# Patient Record
Sex: Female | Born: 1950 | Race: Black or African American | Hispanic: No | Marital: Single | Smoking: Former smoker
Health system: Southern US, Community
[De-identification: ages and names within clinical notes are randomized; demographics above are authoritative.]

## PROBLEM LIST (undated history)

## (undated) DIAGNOSIS — R7989 Other specified abnormal findings of blood chemistry: Secondary | ICD-10-CM

## (undated) DIAGNOSIS — J449 Chronic obstructive pulmonary disease, unspecified: Secondary | ICD-10-CM

## (undated) DIAGNOSIS — J439 Emphysema, unspecified: Secondary | ICD-10-CM

## (undated) DIAGNOSIS — Z86010 Personal history of colonic polyps: Secondary | ICD-10-CM

## (undated) DIAGNOSIS — F319 Bipolar disorder, unspecified: Secondary | ICD-10-CM

## (undated) DIAGNOSIS — E119 Type 2 diabetes mellitus without complications: Secondary | ICD-10-CM

## (undated) DIAGNOSIS — E785 Hyperlipidemia, unspecified: Secondary | ICD-10-CM

## (undated) DIAGNOSIS — R945 Abnormal results of liver function studies: Secondary | ICD-10-CM

## (undated) DIAGNOSIS — K219 Gastro-esophageal reflux disease without esophagitis: Secondary | ICD-10-CM

## (undated) DIAGNOSIS — M1990 Unspecified osteoarthritis, unspecified site: Secondary | ICD-10-CM

## (undated) DIAGNOSIS — F0390 Unspecified dementia without behavioral disturbance: Secondary | ICD-10-CM

## (undated) DIAGNOSIS — N390 Urinary tract infection, site not specified: Secondary | ICD-10-CM

## (undated) DIAGNOSIS — I10 Essential (primary) hypertension: Secondary | ICD-10-CM

## (undated) HISTORY — PX: TONSILLECTOMY AND ADENOIDECTOMY: SUR1326

## (undated) HISTORY — DX: Bipolar disorder, unspecified: F31.9

## (undated) HISTORY — DX: Essential (primary) hypertension: I10

## (undated) HISTORY — PX: ESOPHAGOGASTRODUODENOSCOPY: SHX1529

## (undated) HISTORY — PX: TONSILLECTOMY: SUR1361

## (undated) HISTORY — PX: TUBAL LIGATION: SHX77

## (undated) HISTORY — DX: Personal history of colonic polyps: Z86.010

## (undated) HISTORY — DX: Urinary tract infection, site not specified: N39.0

## (undated) HISTORY — PX: COLONOSCOPY: SHX174

## (undated) HISTORY — DX: Abnormal results of liver function studies: R94.5

## (undated) HISTORY — DX: Unspecified dementia, unspecified severity, without behavioral disturbance, psychotic disturbance, mood disturbance, and anxiety: F03.90

## (undated) HISTORY — DX: Other specified abnormal findings of blood chemistry: R79.89

## (undated) HISTORY — DX: Gastro-esophageal reflux disease without esophagitis: K21.9

## (undated) HISTORY — DX: Hyperlipidemia, unspecified: E78.5

## (undated) HISTORY — DX: Unspecified osteoarthritis, unspecified site: M19.90

## (undated) HISTORY — DX: Emphysema, unspecified: J43.9

---

## 1998-03-04 ENCOUNTER — Emergency Department (HOSPITAL_COMMUNITY): Admission: EM | Admit: 1998-03-04 | Discharge: 1998-03-04 | Payer: Self-pay | Admitting: Emergency Medicine

## 1999-07-20 ENCOUNTER — Emergency Department (HOSPITAL_COMMUNITY): Admission: EM | Admit: 1999-07-20 | Discharge: 1999-07-20 | Payer: Self-pay

## 2004-01-02 ENCOUNTER — Emergency Department (HOSPITAL_COMMUNITY): Admission: EM | Admit: 2004-01-02 | Discharge: 2004-01-02 | Payer: Self-pay

## 2004-02-24 ENCOUNTER — Encounter: Admission: RE | Admit: 2004-02-24 | Discharge: 2004-02-24 | Payer: Self-pay | Admitting: Internal Medicine

## 2004-09-04 ENCOUNTER — Emergency Department (HOSPITAL_COMMUNITY): Admission: EM | Admit: 2004-09-04 | Discharge: 2004-09-04 | Payer: Self-pay | Admitting: Emergency Medicine

## 2004-09-07 ENCOUNTER — Emergency Department (HOSPITAL_COMMUNITY): Admission: EM | Admit: 2004-09-07 | Discharge: 2004-09-07 | Payer: Self-pay | Admitting: Emergency Medicine

## 2004-09-09 ENCOUNTER — Emergency Department (HOSPITAL_COMMUNITY): Admission: EM | Admit: 2004-09-09 | Discharge: 2004-09-09 | Payer: Self-pay | Admitting: Emergency Medicine

## 2006-02-15 ENCOUNTER — Emergency Department (HOSPITAL_COMMUNITY): Admission: EM | Admit: 2006-02-15 | Discharge: 2006-02-15 | Payer: Self-pay | Admitting: Family Medicine

## 2006-11-22 ENCOUNTER — Emergency Department (HOSPITAL_COMMUNITY): Admission: EM | Admit: 2006-11-22 | Discharge: 2006-11-22 | Payer: Self-pay | Admitting: Emergency Medicine

## 2007-10-04 ENCOUNTER — Emergency Department (HOSPITAL_COMMUNITY): Admission: EM | Admit: 2007-10-04 | Discharge: 2007-10-04 | Payer: Self-pay | Admitting: Emergency Medicine

## 2007-12-20 ENCOUNTER — Emergency Department (HOSPITAL_COMMUNITY): Admission: EM | Admit: 2007-12-20 | Discharge: 2007-12-20 | Payer: Self-pay | Admitting: Emergency Medicine

## 2008-10-29 ENCOUNTER — Encounter: Admission: RE | Admit: 2008-10-29 | Discharge: 2008-10-29 | Payer: Self-pay | Admitting: Internal Medicine

## 2008-12-14 ENCOUNTER — Emergency Department (HOSPITAL_COMMUNITY): Admission: EM | Admit: 2008-12-14 | Discharge: 2008-12-14 | Payer: Self-pay | Admitting: Emergency Medicine

## 2009-12-08 ENCOUNTER — Encounter: Admission: RE | Admit: 2009-12-08 | Discharge: 2009-12-08 | Payer: Self-pay | Admitting: Internal Medicine

## 2010-10-09 LAB — POCT RAPID STREP A: Streptococcus, Group A Screen (Direct): NEGATIVE

## 2011-03-06 ENCOUNTER — Other Ambulatory Visit: Payer: Self-pay | Admitting: Internal Medicine

## 2011-03-06 DIAGNOSIS — Z1231 Encounter for screening mammogram for malignant neoplasm of breast: Secondary | ICD-10-CM

## 2011-03-27 ENCOUNTER — Ambulatory Visit
Admission: RE | Admit: 2011-03-27 | Discharge: 2011-03-27 | Disposition: A | Discharge status: Home / Self Care | Payer: Medicare Other | Source: Ambulatory Visit | Attending: Internal Medicine | Admitting: Internal Medicine

## 2011-03-27 DIAGNOSIS — Z1231 Encounter for screening mammogram for malignant neoplasm of breast: Secondary | ICD-10-CM

## 2012-05-12 ENCOUNTER — Other Ambulatory Visit: Payer: Self-pay

## 2012-05-12 DIAGNOSIS — Z1231 Encounter for screening mammogram for malignant neoplasm of breast: Secondary | ICD-10-CM

## 2012-06-16 DIAGNOSIS — Z9889 Other specified postprocedural states: Secondary | ICD-10-CM | POA: Insufficient documentation

## 2012-06-16 DIAGNOSIS — J449 Chronic obstructive pulmonary disease, unspecified: Secondary | ICD-10-CM | POA: Insufficient documentation

## 2012-06-18 ENCOUNTER — Ambulatory Visit
Admission: RE | Admit: 2012-06-18 | Discharge: 2012-06-18 | Disposition: A | Discharge status: Home / Self Care | Payer: Medicare Other | Source: Ambulatory Visit

## 2012-06-18 DIAGNOSIS — Z1231 Encounter for screening mammogram for malignant neoplasm of breast: Secondary | ICD-10-CM

## 2012-11-13 ENCOUNTER — Encounter (HOSPITAL_COMMUNITY): Payer: Self-pay | Admitting: Emergency Medicine

## 2012-11-13 ENCOUNTER — Emergency Department (HOSPITAL_COMMUNITY)
Admission: EM | Admit: 2012-11-13 | Discharge: 2012-11-13 | Disposition: A | Discharge status: Home / Self Care | Payer: Medicare Other | Attending: Emergency Medicine | Admitting: Emergency Medicine

## 2012-11-13 ENCOUNTER — Emergency Department (HOSPITAL_COMMUNITY): Payer: Medicare Other

## 2012-11-13 DIAGNOSIS — J449 Chronic obstructive pulmonary disease, unspecified: Secondary | ICD-10-CM | POA: Insufficient documentation

## 2012-11-13 DIAGNOSIS — R002 Palpitations: Secondary | ICD-10-CM | POA: Insufficient documentation

## 2012-11-13 DIAGNOSIS — M79609 Pain in unspecified limb: Secondary | ICD-10-CM | POA: Insufficient documentation

## 2012-11-13 DIAGNOSIS — E119 Type 2 diabetes mellitus without complications: Secondary | ICD-10-CM | POA: Insufficient documentation

## 2012-11-13 DIAGNOSIS — R5381 Other malaise: Secondary | ICD-10-CM | POA: Insufficient documentation

## 2012-11-13 DIAGNOSIS — R072 Precordial pain: Secondary | ICD-10-CM | POA: Insufficient documentation

## 2012-11-13 DIAGNOSIS — R42 Dizziness and giddiness: Secondary | ICD-10-CM | POA: Insufficient documentation

## 2012-11-13 DIAGNOSIS — J4489 Other specified chronic obstructive pulmonary disease: Secondary | ICD-10-CM | POA: Insufficient documentation

## 2012-11-13 HISTORY — DX: Chronic obstructive pulmonary disease, unspecified: J44.9

## 2012-11-13 HISTORY — DX: Type 2 diabetes mellitus without complications: E11.9

## 2012-11-13 LAB — CBC WITH DIFFERENTIAL/PLATELET
Basophils Absolute: 0.1 10*3/uL (ref 0.0–0.1)
Basophils Relative: 1 % (ref 0–1)
Eosinophils Absolute: 0 10*3/uL (ref 0.0–0.7)
Eosinophils Relative: 0 % (ref 0–5)
HCT: 40.5 % (ref 36.0–46.0)
Hemoglobin: 12.6 g/dL (ref 12.0–15.0)
Lymphocytes Relative: 22 % (ref 12–46)
Lymphs Abs: 2.1 10*3/uL (ref 0.7–4.0)
MCH: 25.2 pg — ABNORMAL LOW (ref 26.0–34.0)
MCHC: 31.1 g/dL (ref 30.0–36.0)
MCV: 81 fL (ref 78.0–100.0)
Monocytes Absolute: 0.5 10*3/uL (ref 0.1–1.0)
Monocytes Relative: 5 % (ref 3–12)
Neutro Abs: 6.7 10*3/uL (ref 1.7–7.7)
Neutrophils Relative %: 72 % (ref 43–77)
Platelets: 207 10*3/uL (ref 150–400)
RBC: 5 MIL/uL (ref 3.87–5.11)
RDW: 14.6 % (ref 11.5–15.5)
WBC: 9.4 10*3/uL (ref 4.0–10.5)

## 2012-11-13 LAB — BASIC METABOLIC PANEL
BUN: 19 mg/dL (ref 6–23)
CO2: 28 mEq/L (ref 19–32)
Calcium: 10.8 mg/dL — ABNORMAL HIGH (ref 8.4–10.5)
Chloride: 100 mEq/L (ref 96–112)
Creatinine, Ser: 1.02 mg/dL (ref 0.50–1.10)
GFR calc Af Amer: 67 mL/min — ABNORMAL LOW (ref >90)
GFR calc non Af Amer: 58 mL/min — ABNORMAL LOW (ref >90)
Glucose, Bld: 195 mg/dL — ABNORMAL HIGH (ref 70–99)
Potassium: 3.8 mEq/L (ref 3.5–5.1)
Sodium: 137 mEq/L (ref 135–145)

## 2012-11-13 LAB — URINALYSIS, ROUTINE W REFLEX MICROSCOPIC
Bilirubin Urine: NEGATIVE
Glucose, UA: 100 mg/dL — AB
Hgb urine dipstick: NEGATIVE
Ketones, ur: NEGATIVE mg/dL
Nitrite: NEGATIVE
Protein, ur: NEGATIVE mg/dL
Specific Gravity, Urine: 1.015 (ref 1.005–1.030)
Urobilinogen, UA: 0.2 mg/dL (ref 0.0–1.0)
pH: 7 (ref 5.0–8.0)

## 2012-11-13 LAB — URINE MICROSCOPIC-ADD ON

## 2012-11-13 LAB — POCT I-STAT TROPONIN I: Troponin i, poc: 0 ng/mL (ref 0.00–0.08)

## 2012-11-13 NOTE — ED Notes (Signed)
Pt complains of palpitations since last night along with chest pain and R arm pain. Describes pain as burning. Also complains of lightheadness for past 2-3 weeks.

## 2012-11-13 NOTE — ED Notes (Signed)
Patient transported to X-ray 

## 2012-11-13 NOTE — ED Provider Notes (Signed)
Medical screening examination/treatment/procedure(s) were performed by non-physician practitioner and as supervising physician I was immediately available for consultation/collaboration.  EKG Interpretation     Ventricular Rate:  72 PR Interval:  129 QRS Duration: 77 QT Interval:  375 QTC Calculation: 410 R Axis:   35 Text Interpretation:  Sinus rhythm No significant change was found              Junius Argyle, MD 11/13/12 1529

## 2012-11-13 NOTE — ED Notes (Signed)
Pt states that palpitations have occurred for past 2 weeks. Pt in NAD, pt ambulated to room, refused wheelchair

## 2012-11-13 NOTE — ED Provider Notes (Signed)
CSN: [id number]     Arrival date & time 11/13/12  5784 History   First MD Initiated Contact with Patient 11/13/12 [phone]     Chief Complaint  Patient presents with  . Palpitations  . Chest Pain  . Arm Pain   (Consider location/radiation/quality/duration/timing/severity/associated sxs/prior Treatment) The history is provided by the patient and medical records.   This is a 62 year old female with past medical history significant for diabetes, COPD, presenting to the ED for palpitations for the past 24 hours, but resolved just PTA. Patient states last night when lying in bed she felt a sensation of her heart "fluttering". States fluttering was constant throughout the night, preventing her from sleep. States she had some midsternal chest pain associated with this, none at present. Patient has had episodes of this in the past, she notes they seem to occur mostly at night. Patient denies any excessive caffeine consumption. States sx do not seem related to exertion.  Has been evaluated by cardiology in the past, approximately 10 years ago but states she was cleared from their service due to negative work-up. States she is concerned because her mother was recently dx with Afib and had similar sx.  Patient also complains of some generalized fatigue and occasional lightheadedness for the past several weeks. She denies any headache, dizziness, or syncopal episodes. Denies any hematochezia, urinary sx, fevers, sweats, or recent illness.  Pt states she is not having palpitations or lightheadedness during initial evaluation.  Past Medical History  Diagnosis Date  . Diabetes mellitus without complication   . COPD (chronic obstructive pulmonary disease)    No past surgical history on file. No family history on file. History  Substance Use Topics  . Smoking status: Never Smoker   . Smokeless tobacco: Not on file  . Alcohol Use: No   OB History   No data available     Review of Systems  Constitutional:  Positive for fatigue.  Cardiovascular: Positive for chest pain and palpitations.  Neurological: Positive for light-headedness.  All other systems reviewed and are negative.    Allergies  Review of patient's allergies indicates not on file.  Home Medications  No current outpatient prescriptions on file.  BP 144/72  Pulse 93  Temp(Src) 98.1 F (36.7 C) (Oral)  Resp 20  SpO2 98%  Physical Exam  Nursing note and vitals reviewed. Constitutional: She is oriented to person, place, and time. She appears well-developed and well-nourished. No distress.  HENT:  Head: Normocephalic and atraumatic.  Mouth/Throat: Oropharynx is clear and moist.  Eyes: Conjunctivae and EOM are normal. Pupils are equal, round, and reactive to light.  Neck: Normal range of motion. Neck supple.  Cardiovascular: Normal rate, regular rhythm and normal heart sounds.   Pulmonary/Chest: Effort normal and breath sounds normal. No respiratory distress. She has no wheezes. She has no rales.  Abdominal: Soft. Bowel sounds are normal. There is no tenderness. There is no guarding.  Musculoskeletal: Normal range of motion. She exhibits no edema.  Neurological: She is alert and oriented to person, place, and time. She has normal strength. She displays no tremor. No cranial nerve deficit or sensory deficit. She displays no seizure activity. Gait normal.  No focal neuro deficits appreciated  Skin: Skin is warm and dry. She is not diaphoretic.  Psychiatric: She has a normal mood and affect.    ED Course  Procedures (including critical care time) Labs Review Labs Reviewed  CBC WITH DIFFERENTIAL - Abnormal; Notable for the following:  MCH 25.2 (*)    All other components within normal limits  BASIC METABOLIC PANEL - Abnormal; Notable for the following:    Glucose, Bld 195 (*)    Calcium 10.8 (*)    GFR calc non Af Amer 58 (*)    GFR calc Af Amer 67 (*)    All other components within normal limits  URINALYSIS,  ROUTINE W REFLEX MICROSCOPIC - Abnormal; Notable for the following:    Glucose, UA 100 (*)    Leukocytes, UA SMALL (*)    All other components within normal limits  URINE MICROSCOPIC-ADD ON - Abnormal; Notable for the following:    Bacteria, UA FEW (*)    All other components within normal limits  URINE CULTURE  POCT I-STAT TROPONIN I   Imaging Review Dg Chest 2 View  11/13/2012   CLINICAL DATA:  Chest fluttering.  Diabetes. Hypertension.  EXAM: CHEST  2 VIEW  COMPARISON:  10/06/2007  FINDINGS: Artifact overlies the chest. Heart size is normal. There is a hiatal hernia. There is calcification of the thoracic aorta. The pulmonary vascularity is normal. The lungs are clear. No effusions. No bony abnormalities.  IMPRESSION: No active cardiopulmonary disease. Atherosclerosis of the aorta . Hiatal hernia.   Electronically Signed   By: Paulina Fusi M.D.   On: 11/13/2012 10:22    EKG Interpretation     Ventricular Rate:  72 PR Interval:  129 QRS Duration: 77 QT Interval:  375 QTC Calculation: 410 R Axis:   35 Text Interpretation:  Sinus rhythm No significant change was found            MDM   1. Palpitations    EKG NSR, no acute ischemic changes.  Labs and CXR pending at this time.  Pt without palpitations during initial evaluation-- will continue to monitor on telemetry.    11:59 AM Labs as above.  Troponin negative.  CXR with hiatal hernia but no active cardiopulmonary disease.  Pt has remained asx while in the ED and no abnormalities have been identified while on cardiac monitor.  At this time, i doubt ACS, PE, dissection, or other vascular collapse.  She is low risk for MACE per heart score.  Will refer to cardiology for likely Holter Monitor.  I have discussed this plan with pt, she acknowledged understanding and agreed to FU.  Strict return precautions advised should sx return or worsen.  Discussed with Dr. Romeo Apple who agrees with assessment and plan of care.  Garlon Hatchet, PA-C 11/13/12 1306

## 2012-11-14 LAB — URINE CULTURE: Colony Count: 45000

## 2013-01-20 ENCOUNTER — Ambulatory Visit: Payer: Medicare Other | Admitting: Cardiology

## 2013-03-28 ENCOUNTER — Emergency Department (HOSPITAL_COMMUNITY)
Admission: EM | Admit: 2013-03-28 | Discharge: 2013-03-28 | Disposition: A | Discharge status: Home / Self Care | Payer: Medicare Other | Attending: Emergency Medicine | Admitting: Emergency Medicine

## 2013-03-28 ENCOUNTER — Encounter (HOSPITAL_COMMUNITY): Payer: Self-pay | Admitting: Emergency Medicine

## 2013-03-28 ENCOUNTER — Emergency Department (HOSPITAL_COMMUNITY): Payer: Medicare Other

## 2013-03-28 DIAGNOSIS — R002 Palpitations: Secondary | ICD-10-CM | POA: Insufficient documentation

## 2013-03-28 DIAGNOSIS — Z794 Long term (current) use of insulin: Secondary | ICD-10-CM | POA: Insufficient documentation

## 2013-03-28 DIAGNOSIS — E119 Type 2 diabetes mellitus without complications: Secondary | ICD-10-CM | POA: Insufficient documentation

## 2013-03-28 DIAGNOSIS — J449 Chronic obstructive pulmonary disease, unspecified: Secondary | ICD-10-CM | POA: Insufficient documentation

## 2013-03-28 DIAGNOSIS — J4489 Other specified chronic obstructive pulmonary disease: Secondary | ICD-10-CM | POA: Insufficient documentation

## 2013-03-28 DIAGNOSIS — Z79899 Other long term (current) drug therapy: Secondary | ICD-10-CM | POA: Insufficient documentation

## 2013-03-28 LAB — BASIC METABOLIC PANEL
BUN: 25 mg/dL — AB (ref 6–23)
CHLORIDE: 101 meq/L (ref 96–112)
CO2: 26 mEq/L (ref 19–32)
Calcium: 9.9 mg/dL (ref 8.4–10.5)
Creatinine, Ser: 1.1 mg/dL (ref 0.50–1.10)
GFR calc Af Amer: 61 mL/min — ABNORMAL LOW (ref >90)
GFR calc non Af Amer: 53 mL/min — ABNORMAL LOW (ref >90)
GLUCOSE: 222 mg/dL — AB (ref 70–99)
POTASSIUM: 4.4 meq/L (ref 3.7–5.3)
Sodium: 138 mEq/L (ref 137–147)

## 2013-03-28 LAB — CBC
HEMATOCRIT: 39.3 % (ref 36.0–46.0)
Hemoglobin: 12.6 g/dL (ref 12.0–15.0)
MCH: 25.8 pg — ABNORMAL LOW (ref 26.0–34.0)
MCHC: 32.1 g/dL (ref 30.0–36.0)
MCV: 80.4 fL (ref 78.0–100.0)
Platelets: 232 10*3/uL (ref 150–400)
RBC: 4.89 MIL/uL (ref 3.87–5.11)
RDW: 14.6 % (ref 11.5–15.5)
WBC: 12.3 10*3/uL — AB (ref 4.0–10.5)

## 2013-03-28 LAB — I-STAT TROPONIN, ED: Troponin i, poc: 0 ng/mL (ref 0.00–0.08)

## 2013-03-28 NOTE — ED Notes (Signed)
Pt to triage via [facility].  Pt reports intermittent palpitations and burning to L side of chest since 1am that woke her up.  Denies sob, nausea, and vomiting.  Denies pain at present.

## 2013-03-28 NOTE — ED Provider Notes (Signed)
CSN: [id number]     Arrival date & time 03/28/13  0938 History   First MD Initiated Contact with Patient 03/28/13 [phone]     Chief Complaint  Patient presents with  . Palpitations     (Consider location/radiation/quality/duration/timing/severity/associated sxs/prior Treatment) Patient is a 63 y.o. female presenting with palpitations. The history is provided by the patient. No language interpreter was used.  Palpitations Onset quality:  At rest Context: caffeine   Context: not anxiety   Associated symptoms: no back pain, no chest pain, no dizziness, no nausea, no numbness, no orthopnea, no shortness of breath and no weakness   Risk factors: diabetes mellitus    patient is a 63 year old female who presents this morning with a history of palpitations. She reports that she was awakened from sleep at about 1:00 this morning with a feeling of palpitations and burning in her left shoulder. She denies associated chest pain or shortness of breath and reports that this episode only lasted for a few seconds. She denies nausea, diaphoresis, fever or recent illness. She reports that her mother recently had a pacemaker placed and that she has A. Fib, she is concerned that she may have some of the same type problems. She was seen this past November for the same complaints, had a negative workup and was referred to cardiology for a possible Holter monitor, she did not follow-up.   Past Medical History  Diagnosis Date  . Diabetes mellitus without complication   . COPD (chronic obstructive pulmonary disease)    Past Surgical History  Procedure Laterality Date  . Tonsillectomy     No family history on file. History  Substance Use Topics  . Smoking status: Never Smoker   . Smokeless tobacco: Not on file  . Alcohol Use: No   OB History   Grav Para Term Preterm Abortions TAB SAB Ect Mult Living                 Review of Systems  Constitutional: Negative for fever and chills.  Respiratory: Negative  for shortness of breath.   Cardiovascular: Positive for palpitations. Negative for chest pain and orthopnea.  Gastrointestinal: Negative for nausea.  Musculoskeletal: Negative for back pain.  Neurological: Negative for dizziness, weakness and numbness.  All other systems reviewed and are negative.      Allergies  Review of patient's allergies indicates not on file.  Home Medications   Current Outpatient Rx  Name  Route  Sig  Dispense  Refill  . atorvastatin (LIPITOR) 20 MG tablet   Oral   Take 20 mg by mouth daily.         . insulin glargine (LANTUS) 100 UNIT/ML injection   Subcutaneous   Inject 25-30 Units into the skin 2 (two) times daily. 25 in the morning, 30 at night         . insulin lispro (HUMALOG) 100 UNIT/ML injection   Subcutaneous   Inject into the skin 2 (two) times daily. 5 units before breakfast and before dinner         . lithium carbonate (ESKALITH) 450 MG CR tablet   Oral   Take 450 mg by mouth 2 (two) times daily.         Marland Kitchen tiotropium (SPIRIVA) 18 MCG inhalation capsule   Inhalation   Place 18 mcg into inhaler and inhale daily.          BP 136/86  Pulse 60  Temp(Src) 98.2 F (36.8 C) (Oral)  Resp 14  Ht 5\' 3"  (1.6 m)  Wt 163 lb (73.936 kg)  BMI 28.88 kg/m2  SpO2 100% Physical Exam  Nursing note and vitals reviewed. Constitutional: She is oriented to person, place, and time. She appears well-developed and well-nourished.  Well appearing. Symptoms completely resolved at time of exam.   HENT:  Head: Normocephalic and atraumatic.  Eyes: Conjunctivae and EOM are normal.  Neck: Normal range of motion. Neck supple. No JVD present. No tracheal deviation present. No thyromegaly present.  Cardiovascular: Normal rate, regular rhythm, normal heart sounds and intact distal pulses.   Pulmonary/Chest: Effort normal and breath sounds normal. No respiratory distress. She has no wheezes.  Abdominal: Soft. Bowel sounds are normal. She exhibits no  distension. There is no tenderness.  Musculoskeletal: Normal range of motion.  Lymphadenopathy:    She has no cervical adenopathy.  Neurological: She is alert and oriented to person, place, and time.  Skin: Skin is warm and dry. No rash noted. No erythema.  Psychiatric: She has a normal mood and affect. Her behavior is normal. Judgment and thought content normal.    ED Course  Procedures (including critical care time) Labs Review Labs Reviewed  CBC - Abnormal; Notable for the following:    WBC 12.3 (*)    MCH 25.8 (*)    All other components within normal limits  BASIC METABOLIC PANEL - Abnormal; Notable for the following:    Glucose, Bld 222 (*)    BUN 25 (*)    GFR calc non Af Amer 53 (*)    GFR calc Af Amer 61 (*)    All other components within normal limits  Randolm Idol, ED   Imaging Review Dg Chest 2 View  03/28/2013   CLINICAL DATA:  Palpitations.  Tachycardia.  EXAM: CHEST  2 VIEW  COMPARISON:  DG CHEST 2 VIEW dated 11/13/2012; DG CHEST 2 VIEW dated 10/04/2007; DG CHEST 2 VIEW dated 01/02/2004  FINDINGS: Cardiac silhouette normal in size, unchanged. Thoracic aorta mildly atherosclerotic, unchanged. Small hiatal hernia, unchanged. Lungs clear. Bronchovascular markings normal. Pulmonary vascularity normal. No visible pleural effusions. No pneumothorax. Visualized bony thorax intact. No significant interval change.  IMPRESSION: No acute cardiopulmonary disease. Small hiatal hernia. Stable examination.   Electronically Signed   By: Evangeline Dakin M.D.   On: 03/28/2013 05:33     EKG Interpretation None      MDM   Final diagnoses:  Palpitations    Pt well-appearing with reassuring EKG and exam. Negative troponin. Chest x-ray unremarkable. No shortness of breath, tachypnea or feeling of palpitations here. Pt has had a similar episode before and was referred to cardiology and she didn't go. Advised follow-up with cardiology for a holter monitor or pt is more comfortable,  follow-up with PCP and he can refer. Stable for discharge today and pt agrees with plan.       Elisha Headland, NP 04/01/13 [phone]

## 2013-03-28 NOTE — ED Notes (Signed)
MD at bedside. 

## 2013-03-28 NOTE — Discharge Instructions (Signed)
Palpitations  A palpitation is the feeling that your heartbeat is irregular or is faster than normal. It may feel like your heart is fluttering or skipping a beat. Palpitations are usually not a serious problem. However, in some cases, you may need further medical evaluation. CAUSES  Palpitations can be caused by:  Smoking.  Caffeine or other stimulants, such as diet pills or energy drinks.  Alcohol.  Stress and anxiety.  Strenuous physical activity.  Fatigue.  Certain medicines.  Heart disease, especially if you have a history of arrhythmias. This includes atrial fibrillation, atrial flutter, or supraventricular tachycardia.  An improperly working pacemaker or defibrillator. DIAGNOSIS  To find the cause of your palpitations, your caregiver will take your history and perform a physical exam. Tests may also be done, including:  Electrocardiography (ECG). This test records the heart's electrical activity.  Cardiac monitoring. This allows your caregiver to monitor your heart rate and rhythm in real time.  Holter monitor. This is a portable device that records your heartbeat and can help diagnose heart arrhythmias. It allows your caregiver to track your heart activity for several days, if needed.  Stress tests by exercise or by giving medicine that makes the heart beat faster. TREATMENT  Treatment of palpitations depends on the cause of your symptoms and can vary greatly. Most cases of palpitations do not require any treatment other than time, relaxation, and monitoring your symptoms. Other causes, such as atrial fibrillation, atrial flutter, or supraventricular tachycardia, usually require further treatment. HOME CARE INSTRUCTIONS   Avoid:  Caffeinated coffee, tea, soft drinks, diet pills, and energy drinks.  Chocolate.  Alcohol.  Stop smoking if you smoke.  Reduce your stress and anxiety. Things that can help you relax include:  A method that measures bodily functions so  you can learn to control them (biofeedback).  Yoga.  Meditation.  Physical activity such as swimming, jogging, or walking.  Get plenty of rest and sleep. SEEK MEDICAL CARE IF:   You continue to have a fast or irregular heartbeat beyond 24 hours.  Your palpitations occur more often. SEEK IMMEDIATE MEDICAL CARE IF:  You develop chest pain or shortness of breath.  You have a severe headache.  You feel dizzy, or you faint. MAKE SURE YOU:  Understand these instructions.  Will watch your condition.  Will get help right away if you are not doing well or get worse. Document Released: 12/23/1999 Document Revised: 04/21/2012 Document Reviewed: 02/23/2011 [facility] Patient Information 2014 [place].   Follow-up with your primary doctor May need a holter monitor Ask your regular doctor about an appointment with Cardiology

## 2013-04-02 NOTE — ED Provider Notes (Signed)
Medical screening examination/treatment/procedure(s) were performed by non-physician practitioner and as supervising physician I was immediately available for consultation/collaboration.   EKG Interpretation   Date/Time:  Saturday March 28 2013 04:38:07 EDT Ventricular Rate:  75 PR Interval:  114 QRS Duration: 78 QT Interval:  378 QTC Calculation: 422 R Axis:   21 Text Interpretation:  Normal sinus rhythm Normal ECG ED PHYSICIAN  INTERPRETATION AVAILABLE IN [facility] Confirmed by TEST, Record  ([id number]) on 03/30/2013 8:14:16 AM       Teressa Lower, MD 04/02/13 [phone]

## 2013-04-20 DIAGNOSIS — M810 Age-related osteoporosis without current pathological fracture: Secondary | ICD-10-CM | POA: Insufficient documentation

## 2013-04-20 DIAGNOSIS — K219 Gastro-esophageal reflux disease without esophagitis: Secondary | ICD-10-CM | POA: Insufficient documentation

## 2013-04-20 DIAGNOSIS — E785 Hyperlipidemia, unspecified: Secondary | ICD-10-CM | POA: Insufficient documentation

## 2013-05-19 ENCOUNTER — Ambulatory Visit: Payer: Medicare Other | Admitting: Cardiology

## 2013-06-14 ENCOUNTER — Encounter (HOSPITAL_COMMUNITY): Payer: Self-pay | Admitting: Emergency Medicine

## 2013-06-14 ENCOUNTER — Emergency Department (HOSPITAL_COMMUNITY)
Admission: EM | Admit: 2013-06-14 | Discharge: 2013-06-15 | Disposition: A | Discharge status: Other Acute Inpatient Hospital | Payer: Medicare Other | Attending: Emergency Medicine | Admitting: Emergency Medicine

## 2013-06-14 DIAGNOSIS — F329 Major depressive disorder, single episode, unspecified: Secondary | ICD-10-CM | POA: Diagnosis present

## 2013-06-14 DIAGNOSIS — E119 Type 2 diabetes mellitus without complications: Secondary | ICD-10-CM | POA: Insufficient documentation

## 2013-06-14 DIAGNOSIS — F209 Schizophrenia, unspecified: Secondary | ICD-10-CM | POA: Insufficient documentation

## 2013-06-14 DIAGNOSIS — IMO0002 Reserved for concepts with insufficient information to code with codable children: Secondary | ICD-10-CM | POA: Insufficient documentation

## 2013-06-14 DIAGNOSIS — F911 Conduct disorder, childhood-onset type: Secondary | ICD-10-CM | POA: Insufficient documentation

## 2013-06-14 DIAGNOSIS — F29 Unspecified psychosis not due to a substance or known physiological condition: Secondary | ICD-10-CM | POA: Diagnosis present

## 2013-06-14 DIAGNOSIS — F32A Depression, unspecified: Secondary | ICD-10-CM | POA: Diagnosis present

## 2013-06-14 DIAGNOSIS — Z794 Long term (current) use of insulin: Secondary | ICD-10-CM | POA: Insufficient documentation

## 2013-06-14 DIAGNOSIS — F319 Bipolar disorder, unspecified: Secondary | ICD-10-CM

## 2013-06-14 DIAGNOSIS — IMO0001 Reserved for inherently not codable concepts without codable children: Secondary | ICD-10-CM

## 2013-06-14 DIAGNOSIS — J4489 Other specified chronic obstructive pulmonary disease: Secondary | ICD-10-CM | POA: Insufficient documentation

## 2013-06-14 DIAGNOSIS — J449 Chronic obstructive pulmonary disease, unspecified: Secondary | ICD-10-CM | POA: Insufficient documentation

## 2013-06-14 DIAGNOSIS — Z79899 Other long term (current) drug therapy: Secondary | ICD-10-CM | POA: Insufficient documentation

## 2013-06-14 LAB — CBC
HEMATOCRIT: 44.2 % (ref 36.0–46.0)
HEMOGLOBIN: 13.8 g/dL (ref 12.0–15.0)
MCH: 26.1 pg (ref 26.0–34.0)
MCHC: 31.2 g/dL (ref 30.0–36.0)
MCV: 83.7 fL (ref 78.0–100.0)
Platelets: 257 10*3/uL (ref 150–400)
RBC: 5.28 MIL/uL — AB (ref 3.87–5.11)
RDW: 14.4 % (ref 11.5–15.5)
WBC: 13.6 10*3/uL — ABNORMAL HIGH (ref 4.0–10.5)

## 2013-06-14 LAB — RAPID URINE DRUG SCREEN, HOSP PERFORMED
Amphetamines: NOT DETECTED
Barbiturates: NOT DETECTED
Benzodiazepines: NOT DETECTED
Cocaine: NOT DETECTED
Opiates: NOT DETECTED
TETRAHYDROCANNABINOL: NOT DETECTED

## 2013-06-14 LAB — CBG MONITORING, ED
Glucose-Capillary: 129 mg/dL — ABNORMAL HIGH (ref 70–99)
Glucose-Capillary: 175 mg/dL — ABNORMAL HIGH (ref 70–99)

## 2013-06-14 LAB — COMPREHENSIVE METABOLIC PANEL
ALT: 32 U/L (ref 0–35)
AST: 45 U/L — ABNORMAL HIGH (ref 0–37)
Albumin: 4.3 g/dL (ref 3.5–5.2)
Alkaline Phosphatase: 79 U/L (ref 39–117)
BILIRUBIN TOTAL: 0.4 mg/dL (ref 0.3–1.2)
BUN: 12 mg/dL (ref 6–23)
CO2: 25 meq/L (ref 19–32)
CREATININE: 1 mg/dL (ref 0.50–1.10)
Calcium: 10.6 mg/dL — ABNORMAL HIGH (ref 8.4–10.5)
Chloride: 99 mEq/L (ref 96–112)
GFR, EST AFRICAN AMERICAN: 68 mL/min — AB (ref >90)
GFR, EST NON AFRICAN AMERICAN: 59 mL/min — AB (ref >90)
GLUCOSE: 153 mg/dL — AB (ref 70–99)
Potassium: 4.3 mEq/L (ref 3.7–5.3)
Sodium: 139 mEq/L (ref 137–147)
Total Protein: 7.6 g/dL (ref 6.0–8.3)

## 2013-06-14 LAB — ACETAMINOPHEN LEVEL: Acetaminophen (Tylenol), Serum: <15 ug/mL (ref 10–30)

## 2013-06-14 LAB — ETHANOL: Alcohol, Ethyl (B): <11 mg/dL (ref 0–11)

## 2013-06-14 LAB — LITHIUM LEVEL: LITHIUM LVL: 0.39 meq/L — AB (ref 0.80–1.40)

## 2013-06-14 LAB — SALICYLATE LEVEL

## 2013-06-14 MED ORDER — IBUPROFEN 200 MG PO TABS: 600.0000 mg | ORAL_TABLET | Freq: Three times a day (TID) | ORAL | Status: DC | PRN

## 2013-06-14 MED ORDER — INSULIN ASPART 100 UNIT/ML ~~LOC~~ SOLN
10.0000 [IU] | Freq: Two times a day (BID) | SUBCUTANEOUS | Status: DC
  Administered 2013-06-15 (×2): 10 [IU] via SUBCUTANEOUS
  Filled 2013-06-14 (×2): qty 1
  Filled 2013-06-14: qty 0.1

## 2013-06-14 MED ORDER — NICOTINE 21 MG/24HR TD PT24: 21.0000 mg | MEDICATED_PATCH | Freq: Every day | TRANSDERMAL | Status: DC

## 2013-06-14 MED ORDER — INSULIN GLARGINE 100 UNIT/ML ~~LOC~~ SOLN
30.0000 [IU] | Freq: Every day | SUBCUTANEOUS | Status: DC
  Administered 2013-06-14: 30 [IU] via SUBCUTANEOUS
  Filled 2013-06-14 (×2): qty 0.3

## 2013-06-14 MED ORDER — INSULIN LISPRO 100 UNIT/ML ~~LOC~~ SOLN: 10.0000 [IU] | Freq: Two times a day (BID) | SUBCUTANEOUS | Status: DC

## 2013-06-14 MED ORDER — INSULIN GLARGINE 100 UNIT/ML ~~LOC~~ SOLN: 10.0000 [IU] | Freq: Two times a day (BID) | SUBCUTANEOUS | Status: DC

## 2013-06-14 MED ORDER — LITHIUM CARBONATE ER 450 MG PO TBCR
450.0000 mg | EXTENDED_RELEASE_TABLET | Freq: Two times a day (BID) | ORAL | Status: DC
Start: 2013-06-14 — End: 2013-06-15
  Administered 2013-06-14 – 2013-06-15 (×2): 450 mg via ORAL
  Filled 2013-06-14 (×3): qty 1

## 2013-06-14 MED ORDER — ZOLPIDEM TARTRATE 5 MG PO TABS: 5.0000 mg | ORAL_TABLET | Freq: Every evening | ORAL | Status: DC | PRN

## 2013-06-14 MED ORDER — INSULIN GLARGINE 100 UNIT/ML ~~LOC~~ SOLN
10.0000 [IU] | Freq: Every day | SUBCUTANEOUS | Status: DC
  Administered 2013-06-15: 10 [IU] via SUBCUTANEOUS
  Filled 2013-06-14: qty 0.1

## 2013-06-14 MED ORDER — ALUM & MAG HYDROXIDE-SIMETH 200-200-20 MG/5ML PO SUSP: 30.0000 mL | ORAL | Status: DC | PRN

## 2013-06-14 MED ORDER — LORAZEPAM 1 MG PO TABS: 1.0000 mg | ORAL_TABLET | Freq: Three times a day (TID) | ORAL | Status: DC | PRN

## 2013-06-14 MED ORDER — VITAMIN D (ERGOCALCIFEROL) 1.25 MG (50000 UNIT) PO CAPS
50000.0000 [IU] | ORAL_CAPSULE | ORAL | Status: DC
  Administered 2013-06-15: 50000 [IU] via ORAL
  Filled 2013-06-14: qty 1

## 2013-06-14 MED ORDER — ONDANSETRON HCL 4 MG PO TABS: 4.0000 mg | ORAL_TABLET | Freq: Three times a day (TID) | ORAL | Status: DC | PRN

## 2013-06-14 MED ORDER — ATORVASTATIN CALCIUM 20 MG PO TABS
20.0000 mg | ORAL_TABLET | Freq: Every morning | ORAL | Status: DC
  Administered 2013-06-15: 20 mg via ORAL
  Filled 2013-06-14: qty 1
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
## 2013-06-14 NOTE — ED Notes (Signed)
Tennis shoes,jeans,blue shirt,phone,underwear in 1 bag at nurses desk

## 2013-06-14 NOTE — ED Provider Notes (Signed)
CSN: [id number]     Arrival date & time 06/14/13  1710 History   First MD Initiated Contact with Patient 06/14/13 1727     Chief Complaint  Patient presents with  . Medical Clearance     (Consider location/radiation/quality/duration/timing/severity/associated sxs/prior Treatment) The history is provided by the patient and medical records. No language interpreter was used.    Kathryn Mcguire is a 63 y.o. female  with a hx of IDDM, COPD presents to the Emergency Department via the [facility] police department with IVC paperwork. Pt was initially seen at [facility], but pt it not a candidate for [facility] due to her diabetes.  Patient denies all allegations of the IVC paperwork. IVC states that patient has a history of schizophrenia and is supposed to be taking lithium however she has not been doing so based on the number of tablets in her bottle. Patient's daughter reports on the IVC paperwork that she's been committed numerous times currently is not sleeping and has been eating very little. The IVC paperwork reports patient has attempted suicide in the past however patient denies this today. IVC paperwork also states that the patient believes someone is coming to kill her.   Patient continued to deny all the symptoms. She denies auditory or visual hallucinations. She denies suicidal or homicidal ideations. Patient is adamant that she is taking her medication as prescribed.   Past Medical History  Diagnosis Date  . Diabetes mellitus without complication   . COPD (chronic obstructive pulmonary disease)    Past Surgical History  Procedure Laterality Date  . Tonsillectomy     No family history on file. History  Substance Use Topics  . Smoking status: Never Smoker   . Smokeless tobacco: Not on file  . Alcohol Use: No   OB History   Grav Para Term Preterm Abortions TAB SAB Ect Mult Living                 Review of Systems  Constitutional: Negative for fever, diaphoresis, appetite change,  fatigue and unexpected weight change.  HENT: Negative for mouth sores.   Eyes: Negative for visual disturbance.  Respiratory: Negative for cough, chest tightness, shortness of breath and wheezing.   Cardiovascular: Negative for chest pain.  Gastrointestinal: Negative for nausea, vomiting, abdominal pain, diarrhea and constipation.  Endocrine: Negative for polydipsia, polyphagia and polyuria.  Genitourinary: Negative for dysuria, urgency, frequency and hematuria.  Musculoskeletal: Negative for back pain and neck stiffness.  Skin: Negative for rash.  Allergic/Immunologic: Negative for immunocompromised state.  Neurological: Negative for syncope, light-headedness and headaches.  Hematological: Does not bruise/bleed easily.  Psychiatric/Behavioral: Negative for sleep disturbance. The patient is not nervous/anxious.       Allergies  Review of patient's allergies indicates no known allergies.  Home Medications   Prior to Admission medications   Medication Sig Start Date End Date Taking? Authorizing Provider  atorvastatin (LIPITOR) 20 MG tablet Take 20 mg by mouth every morning.    Yes Historical Provider, MD  insulin glargine (LANTUS) 100 UNIT/ML injection Inject 10-30 Units into the skin 2 (two) times daily. 10 units every morning; 30 units at bedtime.   Yes Historical Provider, MD  insulin lispro (HUMALOG) 100 UNIT/ML injection Inject 10 Units into the skin 2 (two) times daily. 10 units before breakfast and before dinner. Sliding scale if blood sugar over 200.   Yes Historical Provider, MD  lithium carbonate (ESKALITH) 450 MG CR tablet Take 450 mg by mouth 2 (two) times daily.  Yes Historical Provider, MD  Vitamin D, Ergocalciferol, (DRISDOL) 50000 UNITS CAPS capsule Take 50,000 Units by mouth every 7 (seven) days.   Yes Historical Provider, MD   BP 154/73  Pulse 71  Temp(Src) 100 F (37.8 C) (Oral)  Resp 20  SpO2 99% Physical Exam  Nursing note and vitals  reviewed. Constitutional: She is oriented to person, place, and time. She appears well-developed and well-nourished. No distress.  Awake, alert, nontoxic appearance  HENT:  Head: Normocephalic and atraumatic.  Mouth/Throat: Oropharynx is clear and moist. No oropharyngeal exudate.  Eyes: Conjunctivae are normal. No scleral icterus.  Neck: Normal range of motion. Neck supple.  Cardiovascular: Normal rate, regular rhythm, normal heart sounds and intact distal pulses.   Pulmonary/Chest: Effort normal and breath sounds normal. No respiratory distress. She has no wheezes.  Clear and equal breath sounds  Abdominal: Soft. Bowel sounds are normal. She exhibits no mass. There is no tenderness. There is no rebound and no guarding.  Abdomen soft and nontender  Musculoskeletal: Normal range of motion. She exhibits no edema.  Neurological: She is alert and oriented to person, place, and time.  Speech is clear and goal oriented Moves extremities without ataxia  Skin: Skin is warm and dry. She is not diaphoretic.  Psychiatric: Her speech is normal. Her affect is angry. She is agitated. She is not aggressive and not actively hallucinating. She expresses no homicidal and no suicidal ideation. She expresses no suicidal plans and no homicidal plans.  Patient is frustrated and agitated; moderately angry but cooperative She is not aggressive. She denies hallucinations She denies homicidal or suicidal ideations    ED Course  Procedures (including critical care time) Labs Review Labs Reviewed  LITHIUM LEVEL - Abnormal; Notable for the following:    Lithium Lvl 0.39 (*)    All other components within normal limits  CBC - Abnormal; Notable for the following:    WBC 13.6 (*)    RBC 5.28 (*)    All other components within normal limits  COMPREHENSIVE METABOLIC PANEL - Abnormal; Notable for the following:    Glucose, Bld 153 (*)    Calcium 10.6 (*)    AST 45 (*)    GFR calc non Af Amer 59 (*)    GFR calc  Af Amer 68 (*)    All other components within normal limits  SALICYLATE LEVEL - Abnormal; Notable for the following:    Salicylate Lvl <2.0 (*)    All other components within normal limits  CBG MONITORING, ED - Abnormal; Notable for the following:    Glucose-Capillary 129 (*)    All other components within normal limits  ACETAMINOPHEN LEVEL  ETHANOL  URINE RAPID DRUG SCREEN (HOSP PERFORMED)    Imaging Review No results found.   EKG Interpretation None      MDM   Final diagnoses:  Schizophrenia  COPD (chronic obstructive pulmonary disease)  IDDM (insulin dependent diabetes mellitus)   Johny Blamer presents with IVC paperwork taken out by her daughter stating that she is not taking her medication and has had increase in her abnormal behavior due to a lack of lithium. The patient denies these things including auditory or visual hallucinations, suicidal or homicidal ideations.  7:21 PM Discussed with Marya Amsler of psychiatry who spoke with daughter who endorses increased paranoia in the last 3 weeks.  Patient is denying all this however Marya Amsler feels that she likely needs inpatient criteria. Psychiatrist in the morning. Patient's labs show low lithium levels,  and mild leukocytosis. She is afebrile with normal vital signs. She is medically cleared to be moved to behavioral health.  BP 154/73  Pulse 71  Temp(Src) 100 F (37.8 C) (Oral)  Resp 20  SpO2 99%   Abigail Butts, PA-C 06/14/13 1943

## 2013-06-14 NOTE — ED Notes (Signed)
Pt brought in from [place] with c/o medical clearance, IVC paperwork in process, pt is not candidate fro [facility] d/t hx of DM and lantus medications.

## 2013-06-14 NOTE — ED Provider Notes (Signed)
Medical screening examination/treatment/procedure(s) were conducted as a shared visit with non-physician practitioner(s) and myself.  I personally evaluated the patient during the encounter.   EKG Interpretation None     Patient here from outside psychiatric facility due 2 worsening schizophrenia manifested is paranoid thoughts. Blood sugar is stable here. She will be seen by psychiatry in place appropriately. First opinion for IVC completed  Leota Jacobsen, MD 06/14/13 2336

## 2013-06-14 NOTE — ED Notes (Signed)
Attempted to give report to psych ED, tech sts nurses are in the shift change right now and are unable to take report, they will call later. Charge RN made aware.

## 2013-06-15 ENCOUNTER — Emergency Department (HOSPITAL_COMMUNITY): Payer: Medicare Other

## 2013-06-15 ENCOUNTER — Encounter (HOSPITAL_COMMUNITY): Payer: Self-pay | Admitting: Psychiatry

## 2013-06-15 DIAGNOSIS — F329 Major depressive disorder, single episode, unspecified: Secondary | ICD-10-CM | POA: Diagnosis present

## 2013-06-15 DIAGNOSIS — F32A Depression, unspecified: Secondary | ICD-10-CM | POA: Diagnosis present

## 2013-06-15 DIAGNOSIS — F29 Unspecified psychosis not due to a substance or known physiological condition: Secondary | ICD-10-CM

## 2013-06-15 DIAGNOSIS — F319 Bipolar disorder, unspecified: Secondary | ICD-10-CM

## 2013-06-15 DIAGNOSIS — F3164 Bipolar disorder, current episode mixed, severe, with psychotic features: Secondary | ICD-10-CM | POA: Insufficient documentation

## 2013-06-15 LAB — CBG MONITORING, ED
GLUCOSE-CAPILLARY: 110 mg/dL — AB (ref 70–99)
GLUCOSE-CAPILLARY: 98 mg/dL (ref 70–99)
Glucose-Capillary: 164 mg/dL — ABNORMAL HIGH (ref 70–99)

## 2013-06-15 MED ORDER — HYDROXYZINE HCL 25 MG PO TABS
50.0000 mg | ORAL_TABLET | Freq: Once | ORAL | Status: AC
  Administered 2013-06-15: 50 mg via ORAL
  Filled 2013-06-15: qty 2

## 2013-06-15 MED ORDER — HYDROXYZINE HCL 50 MG/ML IM SOLN: 50.0000 mg | Freq: Once | INTRAMUSCULAR | Status: DC

## 2013-06-15 NOTE — ED Notes (Addendum)
Patient came to nurse's station c/o chest pain and a racing feeling in her chest, Manus Gunning NP notified

## 2013-06-15 NOTE — Progress Notes (Signed)
Pt daughter Glenard Haring [phone], called asking if patient needed any personal items. Patient daughter informed that patient has all personal items that she needs at this time. CSW unable to provide more information as patient has not been willing to sign consent to release ifnormation. Patient daughter states that she understands. Pt daughter plans to come visit later today.    Noreene Larsson [phone]  ED CSW 06/15/2013 1109am

## 2013-06-15 NOTE — Progress Notes (Signed)
Pt referred to [facility] pending review.  Pt referred to [facility] pending review, message left regarding beds available.  Pt referred to [facility] pending review, message left regarding beds available.   No female beds available at [facility].   Noreene Larsson [phone]  ED CSW 06/15/2013 1055am

## 2013-06-15 NOTE — ED Notes (Signed)
Patient declined to give this Probation officer permission to speak with daughter.  Informed pt of transfer to [facility].
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
## 2013-06-15 NOTE — ED Notes (Signed)
[facility] called for transport request.  Officer stated could be later this afternoon and they would call prior to arrival for pick up.

## 2013-06-15 NOTE — Consult Note (Signed)
Patient seen, evaluated by me. Patient needs inpatient care as she is guarded, paranoid. Collateral information was obtained from the family

## 2013-06-15 NOTE — Progress Notes (Signed)
Pt was given Suicide Chief Strategy Officer and  [place] packet.

## 2013-06-15 NOTE — ED Notes (Signed)
Patient went into another patient's room and took her wig while they were in the bathroom, when asked why she took the wig the patient answered, "I don't know" and went back in her room and laid down.

## 2013-06-15 NOTE — Progress Notes (Signed)
Pt accepted to [facility] room # [phone] by Dr. Alvino Chapel. RN can call report to [phone]. Patient to be transported involuntarily by sheriff.   Noreene Larsson [phone]  ED CSW 06/15/2013 06/15/2013 12:05pm

## 2013-06-15 NOTE — BH Assessment (Signed)
Per Inocencio Homes, [facility] at [facility], adult unit is at capacity. Contacted the following facilities for placement:  BED AVAILABLE. FAXED CLINICAL INFORMATION:  [facility]: Per [facility]: Per Marye Round  AT CAPACITY:  [facility]: Per [facility]: Per Wandra Feinstein: Per [facility]: Per [facility]: Per [facility]: Per [place]: Per [facility]: Per [facility]: Per [place]: Per [facility]: Per [facility]: Per [place]  [facility]al Denisha Hoel: Per [place]: Per [facility]: Per [place], [facility], [facility] Triage Specialist [phone]

## 2013-06-15 NOTE — ED Provider Notes (Signed)
Medical screening examination/treatment/procedure(s) were conducted as a shared visit with non-physician practitioner(s) and myself.  I personally evaluated the patient during the encounter.   EKG Interpretation None       Leota Jacobsen, MD 06/15/13 [phone]

## 2013-06-17 DIAGNOSIS — R740 Nonspecific elevation of levels of transaminase and lactic acid dehydrogenase [LDH]: Secondary | ICD-10-CM

## 2013-06-17 DIAGNOSIS — R7401 Elevation of levels of liver transaminase levels: Secondary | ICD-10-CM | POA: Insufficient documentation

## 2013-08-04 ENCOUNTER — Encounter (HOSPITAL_COMMUNITY): Payer: Self-pay | Admitting: Emergency Medicine

## 2013-08-04 ENCOUNTER — Emergency Department (INDEPENDENT_AMBULATORY_CARE_PROVIDER_SITE_OTHER): Payer: Medicare Other

## 2013-08-04 ENCOUNTER — Emergency Department (INDEPENDENT_AMBULATORY_CARE_PROVIDER_SITE_OTHER)
Admission: EM | Admit: 2013-08-04 | Discharge: 2013-08-04 | Disposition: A | Discharge status: Home / Self Care | Payer: Medicare Other | Source: Home / Self Care | Attending: Family Medicine | Admitting: Family Medicine

## 2013-08-04 DIAGNOSIS — K5909 Other constipation: Secondary | ICD-10-CM

## 2013-08-04 DIAGNOSIS — R300 Dysuria: Secondary | ICD-10-CM

## 2013-08-04 DIAGNOSIS — R7402 Elevation of levels of lactic acid dehydrogenase (LDH): Secondary | ICD-10-CM

## 2013-08-04 DIAGNOSIS — L299 Pruritus, unspecified: Secondary | ICD-10-CM

## 2013-08-04 DIAGNOSIS — R740 Nonspecific elevation of levels of transaminase and lactic acid dehydrogenase [LDH]: Principal | ICD-10-CM

## 2013-08-04 DIAGNOSIS — R7401 Elevation of levels of liver transaminase levels: Secondary | ICD-10-CM

## 2013-08-04 LAB — COMPREHENSIVE METABOLIC PANEL
ALBUMIN: 4 g/dL (ref 3.5–5.2)
ALT: 118 U/L — ABNORMAL HIGH (ref 0–35)
ANION GAP: 12 (ref 5–15)
AST: 74 U/L — ABNORMAL HIGH (ref 0–37)
Alkaline Phosphatase: 110 U/L (ref 39–117)
BUN: 13 mg/dL (ref 6–23)
CHLORIDE: 96 meq/L (ref 96–112)
CO2: 29 mEq/L (ref 19–32)
CREATININE: 1.03 mg/dL (ref 0.50–1.10)
Calcium: 10.5 mg/dL (ref 8.4–10.5)
GFR calc Af Amer: 66 mL/min — ABNORMAL LOW (ref >90)
GFR calc non Af Amer: 57 mL/min — ABNORMAL LOW (ref >90)
Glucose, Bld: 203 mg/dL — ABNORMAL HIGH (ref 70–99)
Potassium: 4.6 mEq/L (ref 3.7–5.3)
Sodium: 137 mEq/L (ref 137–147)
TOTAL PROTEIN: 7.3 g/dL (ref 6.0–8.3)
Total Bilirubin: 0.2 mg/dL — ABNORMAL LOW (ref 0.3–1.2)

## 2013-08-04 LAB — CBC
HCT: 43.7 % (ref 36.0–46.0)
Hemoglobin: 14.1 g/dL (ref 12.0–15.0)
MCH: 26.1 pg (ref 26.0–34.0)
MCHC: 32.3 g/dL (ref 30.0–36.0)
MCV: 80.8 fL (ref 78.0–100.0)
Platelets: 201 10*3/uL (ref 150–400)
RBC: 5.41 MIL/uL — ABNORMAL HIGH (ref 3.87–5.11)
RDW: 13.2 % (ref 11.5–15.5)
WBC: 10.9 10*3/uL — ABNORMAL HIGH (ref 4.0–10.5)

## 2013-08-04 LAB — POCT URINALYSIS DIP (DEVICE)
Bilirubin Urine: NEGATIVE
Glucose, UA: NEGATIVE mg/dL
Hgb urine dipstick: NEGATIVE
Ketones, ur: NEGATIVE mg/dL
LEUKOCYTES UA: NEGATIVE
Nitrite: NEGATIVE
Protein, ur: NEGATIVE mg/dL
SPECIFIC GRAVITY, URINE: 1.015 (ref 1.005–1.030)
UROBILINOGEN UA: 0.2 mg/dL (ref 0.0–1.0)
pH: 7 (ref 5.0–8.0)

## 2013-08-04 MED ORDER — PHENAZOPYRIDINE HCL 200 MG PO TABS: 200.0000 mg | ORAL_TABLET | Freq: Three times a day (TID) | ORAL | Status: DC | PRN

## 2013-08-04 MED ORDER — POLYETHYLENE GLYCOL 3350 17 G PO PACK: 17.0000 g | PACK | Freq: Every day | ORAL | Status: DC

## 2013-08-04 NOTE — ED Provider Notes (Signed)
Medical screening examination/treatment/procedure(s) were performed by resident physician or non-physician practitioner and as supervising physician I was immediately available for consultation/collaboration.   Pauline Good MD.   Billy Fischer, MD 08/04/13 [phone]

## 2013-08-04 NOTE — ED Provider Notes (Signed)
CSN: [id number]     Arrival date & time 08/04/13  1006 History   First MD Initiated Contact with Patient 08/04/13 1023     Chief Complaint  Patient presents with  . Urinary Tract Infection  . Pruritis   (Consider location/radiation/quality/duration/timing/severity/associated sxs/prior Treatment) HPI Comments: 63 year old female with history of type 2 diabetes and bipolar disorder with psychotic features presents complaining of  dysuria, left-sided lower back pain and flank pain, itching all over her body, and not having had a bowel movement for a week. She started to experience the dysuria along with the itching and back pain 2 days ago. The pain in her back is nonradiating and constant, not worsened by position. She denies any injury or any lifting recently. She denies nausea, vomiting, diarrhea, fever, chills. She denies any new medications. Denies any rash. She does have some weight loss recently that was intentional. She denies any recent catheterizations. She does have a recent hospitalization-involuntary commitment for psychosis-last month. She has been taking all medications as prescribed.   Patient is a 63 y.o. female presenting with urinary tract infection.  Urinary Tract Infection Pertinent negatives include no chest pain, no abdominal pain and no shortness of breath.    Past Medical History  Diagnosis Date  . Diabetes mellitus without complication   . COPD (chronic obstructive pulmonary disease)    Past Surgical History  Procedure Laterality Date  . Tonsillectomy     History reviewed. No pertinent family history. History  Substance Use Topics  . Smoking status: Never Smoker   . Smokeless tobacco: Not on file  . Alcohol Use: No   OB History   Grav Para Term Preterm Abortions TAB SAB Ect Mult Living                 Review of Systems  Constitutional: Negative for fever and chills.  Eyes: Negative for visual disturbance.  Respiratory: Negative for cough and shortness of  breath.   Cardiovascular: Negative for chest pain, palpitations and leg swelling.  Gastrointestinal: Positive for constipation. Negative for nausea, vomiting and abdominal pain.  Endocrine: Negative for polydipsia and polyuria.  Genitourinary: Positive for dysuria and flank pain. Negative for urgency, frequency, hematuria, vaginal discharge and genital sores.  Musculoskeletal: Positive for back pain. Negative for arthralgias and myalgias.  Skin: Negative for rash.       Itching all over  Neurological: Negative for dizziness, weakness and light-headedness.  All other systems reviewed and are negative.   Allergies  Review of patient's allergies indicates no known allergies.  Home Medications   Prior to Admission medications   Medication Sig Start Date End Date Taking? Authorizing Provider  atorvastatin (LIPITOR) 20 MG tablet Take 20 mg by mouth every morning.     Historical Provider, MD  insulin glargine (LANTUS) 100 UNIT/ML injection Inject 10-30 Units into the skin 2 (two) times daily. 10 units every morning; 30 units at bedtime.    Historical Provider, MD  insulin lispro (HUMALOG) 100 UNIT/ML injection Inject 10 Units into the skin 2 (two) times daily. 10 units before breakfast and before dinner. Sliding scale if blood sugar over 200.    Historical Provider, MD  lithium carbonate (ESKALITH) 450 MG CR tablet Take 450 mg by mouth 2 (two) times daily.    Historical Provider, MD  phenazopyridine (PYRIDIUM) 200 MG tablet Take 1 tablet (200 mg total) by mouth 3 (three) times daily as needed (for painful urination). 08/04/13   Liam Graham, PA-C  polyethylene glycol (  MIRALAX) packet Take 17 g by mouth daily. 08/04/13   Liam Graham, PA-C  Vitamin D, Ergocalciferol, (DRISDOL) 50000 UNITS CAPS capsule Take 50,000 Units by mouth every 7 (seven) days.    Historical Provider, MD   BP 158/96  Pulse 78  Temp(Src) 98.7 F (37.1 C) (Oral)  Resp 16  SpO2 100% Physical Exam  Nursing note and  vitals reviewed. Constitutional: She is oriented to person, place, and time. Vital signs are normal. She appears well-developed and well-nourished. No distress.  HENT:  Head: Normocephalic and atraumatic.  Cardiovascular: Normal rate, regular rhythm and normal heart sounds.   Pulmonary/Chest: Effort normal and breath sounds normal. No respiratory distress.  Abdominal: Soft. Bowel sounds are normal. She exhibits no distension and no mass. There is no tenderness. There is CVA tenderness (left). There is no rebound and no guarding.  Musculoskeletal:       Thoracic back: She exhibits tenderness (left paraspinous musculature). She exhibits normal range of motion, no bony tenderness, no swelling and no deformity.       Lumbar back: She exhibits tenderness (left paraspinous musculature). She exhibits normal range of motion, no bony tenderness, no swelling and no deformity.  Neurological: She is alert and oriented to person, place, and time. She has normal strength. No cranial nerve deficit or sensory deficit. She exhibits normal muscle tone. Coordination and gait normal. GCS eye subscore is 4. GCS verbal subscore is 5. GCS motor subscore is 6.  Skin: Skin is warm and dry. No abrasion, no bruising, no ecchymosis, no petechiae and no rash noted. She is not diaphoretic. No erythema. No pallor.  Psychiatric: She has a normal mood and affect. Judgment normal.    ED Course  Procedures (including critical care time) Labs Review Labs Reviewed  CBC - Abnormal; Notable for the following:    WBC 10.9 (*)    RBC 5.41 (*)    All other components within normal limits  COMPREHENSIVE METABOLIC PANEL - Abnormal; Notable for the following:    Glucose, Bld 203 (*)    AST 74 (*)    ALT 118 (*)    Total Bilirubin 0.2 (*)    GFR calc non Af Amer 57 (*)    GFR calc Af Amer 66 (*)    All other components within normal limits  POCT URINALYSIS DIP (DEVICE)    Imaging Review Dg Abd Acute W/chest  08/04/2013    CLINICAL DATA:  Left flank pain.  Constipation.  EXAM: ACUTE ABDOMEN SERIES (ABDOMEN 2 VIEW & CHEST 1 VIEW)  COMPARISON:  07/27/2013 chest x-ray.  FINDINGS: No infiltrate, congestive heart failure or pneumothorax.  Calcified slightly tortuous aorta.  Heart size within normal limits.  Small hiatal hernia.  Moderate stool right colon, portions of the left colon and rectosigmoid region.  Top-normal size gas-filled small bowel loops right aspect of the abdomen.  No free intraperitoneal air.  Mild degenerative changes lumbar spine.  IMPRESSION: Nonspecific bowel gas pattern as noted above without findings of bowel obstruction.   Electronically Signed   By: Chauncey Cruel M.D.   On: 08/04/2013 11:03     MDM   1. Transaminitis   2. Dysuria   3. Itching   4. Other constipation    X-ray rules no evidence of obstruction. Her white count is mildly elevated but lower than her baseline, and she has a mild transaminitis. I called the on-call gastroenterologist, Dr. Fuller Plan, who will see this patient in clinic as soon as possible. The  patient was informed of this and instructed to call the number provided for their office for followup, and an outpatient gastroenterology referral was also provided. She will go to the emergency department if worsening. We'll treat symptoms for now with MiraLAX, Pyridium, and antihistamines.  Meds ordered this encounter  Medications  . polyethylene glycol (MIRALAX) packet    Sig: Take 17 g by mouth daily.    Dispense:  14 each    Refill:  0    Order Specific Question:  Supervising Provider    Answer:  Billy Fischer [phone]  . phenazopyridine (PYRIDIUM) 200 MG tablet    Sig: Take 1 tablet (200 mg total) by mouth 3 (three) times daily as needed (for painful urination).    Dispense:  12 tablet    Refill:  0    Order Specific Question:  Supervising Provider    Answer:  Ihor Gully D [[id number]]       Liam Graham, PA-C 08/04/13 1257

## 2013-08-04 NOTE — ED Notes (Signed)
C/o uti and body itch States she has not had a bowel movement

## 2013-08-04 NOTE — Discharge Instructions (Signed)
I spoke and with the gastroenterologist on call, Dr. Fuller Plan.  He would like to see you in the office as soon as possible, please call the number provided on the first page of your paperwork to schedule an appointment for further evaluation of your condition. The included medications will help with your symptoms. Take Tylenol as needed for back pain.  Pruritus  Pruritus is an itch. There are many different problems that can cause an itch. Dry skin is one of the most common causes of itching. Most cases of itching do not require medical attention.  HOME CARE INSTRUCTIONS  Make sure your skin is moistened on a regular basis. A moisturizer that contains petroleum jelly is best for keeping moisture in your skin. If you develop a rash, you may try the following for relief:   Use corticosteroid cream.  Apply cool compresses to the affected areas.  Bathe with Epsom salts or baking soda in the bathwater.  Soak in colloidal oatmeal baths. These are available at your pharmacy.  Apply baking soda paste to the rash. Stir water into baking soda until it reaches a paste-like consistency.  Use an anti-itch lotion.  Take over-the-counter diphenhydramine medicine by mouth as the instructions direct.  Avoid scratching. Scratching may cause the rash to become infected. If itching is very bad, your caregiver may suggest prescription lotions or creams to lessen your symptoms.  Avoid hot showers, which can make itching worse. A cold shower may help with itching as long as you use a moisturizer after the shower. SEEK MEDICAL CARE IF: The itching does not go away after several days. Document Released: 09/06/2010 Document Revised: 05/11/2013 Document Reviewed: 09/06/2010 [facility] Patient Information 2015 [place], [place]. This information is not intended to replace advice given to you by your health care provider. Make sure you discuss any questions you have with your health care provider.  Dysuria Dysuria is the  medical term for pain with urination. There are many causes for dysuria, but urinary tract infection is the most common. If a urinalysis was performed it can show that there is a urinary tract infection. A urine culture confirms that you or your child is sick. You will need to follow up with a healthcare provider because:  If a urine culture was done you will need to know the culture results and treatment recommendations.  If the urine culture was positive, you or your child will need to be put on antibiotics or know if the antibiotics prescribed are the right antibiotics for your urinary tract infection.  If the urine culture is negative (no urinary tract infection), then other causes may need to be explored or antibiotics need to be stopped. Today laboratory work may have been done and there does not seem to be an infection. If cultures were done they will take at least 24 to 48 hours to be completed. Today x-rays may have been taken and they read as normal. No cause can be found for the problems. The x-rays may be re-read by a radiologist and you will be contacted if additional findings are made. You or your child may have been put on medications to help with this problem until you can see your primary caregiver. If the problems get better, see your primary caregiver if the problems return. If you were given antibiotics (medications which kill germs), take all of the mediations as directed for the full course of treatment.  If laboratory work was done, you need to find the results. Leave a  telephone number where you can be reached. If this is not possible, make sure you find out how you are to get test results. HOME CARE INSTRUCTIONS   Drink lots of fluids. For adults, drink eight, 8 ounce glasses of clear juice or water a day. For children, replace fluids as suggested by your caregiver.  Empty the bladder often. Avoid holding urine for long periods of time.  After a bowel movement, women should  cleanse front to back, using each tissue only once.  Empty your bladder before and after sexual intercourse.  Take all the medicine given to you until it is gone. You may feel better in a few days, but TAKE ALL MEDICINE.  Avoid caffeine, tea, alcohol and carbonated beverages, because they tend to irritate the bladder.  In men, alcohol may irritate the prostate.  Only take over-the-counter or prescription medicines for pain, discomfort, or fever as directed by your caregiver.  If your caregiver has given you a follow-up appointment, it is very important to keep that appointment. Not keeping the appointment could result in a chronic or permanent injury, pain, and disability. If there is any problem keeping the appointment, you must call back to this facility for assistance. SEEK IMMEDIATE MEDICAL CARE IF:   Back pain develops.  A fever develops.  There is nausea (feeling sick to your stomach) or vomiting (throwing up).  Problems are no better with medications or are getting worse. MAKE SURE YOU:   Understand these instructions.  Will watch your condition.  Will get help right away if you are not doing well or get worse. Document Released: 09/23/2003 Document Revised: 03/19/2011 Document Reviewed: 07/31/2007 [facility] Patient Information 2015 [place], [place]. This information is not intended to replace advice given to you by your health care provider. Make sure you discuss any questions you have with your health care provider.  Constipation Constipation is when a person has fewer than three bowel movements a week, has difficulty having a bowel movement, or has stools that are dry, hard, or larger than normal. As people grow older, constipation is more common. If you try to fix constipation with medicines that make you have a bowel movement (laxatives), the problem may get worse. Long-term laxative use may cause the muscles of the colon to become weak. A low-fiber diet, not taking in  enough fluids, and taking certain medicines may make constipation worse.  CAUSES   Certain medicines, such as antidepressants, pain medicine, iron supplements, antacids, and water pills.   Certain diseases, such as diabetes, irritable bowel syndrome (IBS), thyroid disease, or depression.   Not drinking enough water.   Not eating enough fiber-rich foods.   Stress or travel.   Lack of physical activity or exercise.   Ignoring the urge to have a bowel movement.   Using laxatives too much.  SIGNS AND SYMPTOMS   Having fewer than three bowel movements a week.   Straining to have a bowel movement.   Having stools that are hard, dry, or larger than normal.   Feeling full or bloated.   Pain in the lower abdomen.   Not feeling relief after having a bowel movement.  DIAGNOSIS  Your health care provider will take a medical history and perform a physical exam. Further testing may be done for severe constipation. Some tests may include:  A barium enema X-ray to examine your rectum, colon, and, sometimes, your small intestine.   A sigmoidoscopy to examine your lower colon.   A colonoscopy to examine  your entire colon. TREATMENT  Treatment will depend on the severity of your constipation and what is causing it. Some dietary treatments include drinking more fluids and eating more fiber-rich foods. Lifestyle treatments may include regular exercise. If these diet and lifestyle recommendations do not help, your health care provider may recommend taking over-the-counter laxative medicines to help you have bowel movements. Prescription medicines may be prescribed if over-the-counter medicines do not work.  HOME CARE INSTRUCTIONS   Eat foods that have a lot of fiber, such as fruits, vegetables, whole grains, and beans.  Limit foods high in fat and processed sugars, such as french fries, hamburgers, cookies, candies, and soda.   A fiber supplement may be added to your diet if  you cannot get enough fiber from foods.   Drink enough fluids to keep your urine clear or pale yellow.   Exercise regularly or as directed by your health care provider.   Go to the restroom when you have the urge to go. Do not hold it.   Only take over-the-counter or prescription medicines as directed by your health care provider. Do not take other medicines for constipation without talking to your health care provider first.  [place] IF:   You have bright red blood in your stool.   Your constipation lasts for more than 4 days or gets worse.   You have abdominal or rectal pain.   You have thin, pencil-like stools.   You have unexplained weight loss. MAKE SURE YOU:   Understand these instructions.  Will watch your condition.  Will get help right away if you are not doing well or get worse. Document Released: 09/23/2003 Document Revised: 12/30/2012 Document Reviewed: 10/06/2012 [facility] Patient Information 2015 [place], [place]. This information is not intended to replace advice given to you by your health care provider. Make sure you discuss any questions you have with your health care provider.

## 2013-08-05 ENCOUNTER — Encounter: Payer: Self-pay | Admitting: Gastroenterology

## 2013-08-06 NOTE — ED Notes (Signed)
Pt  Called      Stating  The soonest  She  Could  Be  Seen  By   Dr  Fuller Plan the  gastroeneterologist      Was  Oct  12     Dr  Fuller Plan  Office  Called  And they  Will  Move her  appt  Up  To  Next  Weds      They  Will call  Pt  And  Inform    Z  Baker  Notified  And  This  Is  Ok      Attempted  To  Call  Pt  Back  But  [place]  -  Message  left

## 2013-08-12 ENCOUNTER — Ambulatory Visit: Payer: Medicare Other | Admitting: Nurse Practitioner

## 2013-08-24 ENCOUNTER — Other Ambulatory Visit (INDEPENDENT_AMBULATORY_CARE_PROVIDER_SITE_OTHER): Payer: Medicare Other

## 2013-08-24 ENCOUNTER — Ambulatory Visit (INDEPENDENT_AMBULATORY_CARE_PROVIDER_SITE_OTHER): Payer: Medicare Other | Admitting: Nurse Practitioner

## 2013-08-24 ENCOUNTER — Encounter: Payer: Self-pay | Admitting: Nurse Practitioner

## 2013-08-24 VITALS — BP 132/80 | HR 94 | Ht 63.0 in | Wt 142.0 lb

## 2013-08-24 DIAGNOSIS — R198 Other specified symptoms and signs involving the digestive system and abdomen: Secondary | ICD-10-CM

## 2013-08-24 DIAGNOSIS — R634 Abnormal weight loss: Secondary | ICD-10-CM

## 2013-08-24 DIAGNOSIS — R194 Change in bowel habit: Secondary | ICD-10-CM

## 2013-08-24 DIAGNOSIS — R7989 Other specified abnormal findings of blood chemistry: Secondary | ICD-10-CM

## 2013-08-24 DIAGNOSIS — R945 Abnormal results of liver function studies: Secondary | ICD-10-CM

## 2013-08-24 LAB — HEPATIC FUNCTION PANEL
ALK PHOS: 70 U/L (ref 39–117)
ALT: 77 U/L — AB (ref 0–35)
AST: 54 U/L — AB (ref 0–37)
Albumin: 4.3 g/dL (ref 3.5–5.2)
BILIRUBIN TOTAL: 0.5 mg/dL (ref 0.2–1.2)
Bilirubin, Direct: 0.1 mg/dL (ref 0.0–0.3)
Total Protein: 7.4 g/dL (ref 6.0–8.3)

## 2013-08-24 NOTE — Progress Notes (Addendum)
HPI :  Patient is a 63 year old female, new to this practice, referred by PCP for evaluation of elevated LFTs. Patient was seen at urgent care 08/04/13 for evaluation of dysuria and left lower abdominal pain. Acute abdominal series revealed moderate stool in right colon.  Urinalysis was unremarkable. Patient was given MiraLax and pyridium. CBC revealed minimally elevated white count of 10.9. Hgb normal at 14. AST elevated at 74 and ALT 118. Patient is still taking MiraLax, almost on a daily basis and overall her bowels are moving well. She still has intermittent left lower quadrant discomfort radiating around to her back. Pain is not particularly relieved with defecation.  In regards to liver function studies, in early June of this year AST was 45, numbers otherwise normal. There are no baseline LFTs for comparison. Patient has no knowledge of previous elevated LFTs. No family history of liver disease. No recent medication changes. Patient has been on Lipitor for many, many years. No upper abdominal pain the patient does report a 15 pound weight loss since March, despite a good appetite.   Past Medical History  Diagnosis Date  . Diabetes mellitus without complication   . COPD (chronic obstructive pulmonary disease)   . Arthritis   . Bipolar depression   . Elevated LFTs   . Hypertension   . Hyperlipemia   . UTI (lower urinary tract infection)     Family History  Problem Relation Age of Onset  . Colon cancer Neg Hx   . Stomach cancer Neg Hx   . Esophageal cancer Neg Hx    History  Substance Use Topics  . Smoking status: Former Research scientist (life sciences)  . Smokeless tobacco: Never Used  . Alcohol Use: No   Current Outpatient Prescriptions  Medication Sig Dispense Refill  . atorvastatin (LIPITOR) 20 MG tablet Take 20 mg by mouth every morning.       . insulin glargine (LANTUS) 100 UNIT/ML injection Inject 10-30 Units into the skin 2 (two) times daily. 10 units every morning; 30 units at bedtime.        . insulin lispro (HUMALOG) 100 UNIT/ML injection Inject 10 Units into the skin 2 (two) times daily. 10 units before breakfast and before dinner. Sliding scale if blood sugar over 200.      Marland Kitchen lithium carbonate (ESKALITH) 450 MG CR tablet Take 450 mg by mouth 2 (two) times daily.      . phenazopyridine (PYRIDIUM) 200 MG tablet Take 1 tablet (200 mg total) by mouth 3 (three) times daily as needed (for painful urination).  12 tablet  0  . polyethylene glycol (MIRALAX) packet Take 17 g by mouth daily.  14 each  0  . Vitamin D, Ergocalciferol, (DRISDOL) 50000 UNITS CAPS capsule Take 50,000 Units by mouth every 7 (seven) days.       No current facility-administered medications for this visit.   No Known Allergies   Review of Systems: Positive for arthritis, back pain, fatigue, itching, muscle pain, sore throat, swelling of feet, excessive thirst, excessive urination, urinary frequency, urinary leakage and voice.  changes All systems reviewed and negative except where noted in HPI.    Physical Exam: BP 132/80  Pulse 94  Ht 5\' 3"  (1.6 m)  Wt 142 lb (64.411 kg)  BMI 25.16 kg/m2 Constitutional: Pleasant,well-developed, female in no acute distress. HEENT: Normocephalic and atraumatic. Conjunctivae are normal. No scleral icterus. Neck supple.  Cardiovascular: Normal rate, regular rhythm.  Pulmonary/chest: Effort normal and breath sounds normal. No wheezing, rales or  rhonchi. Abdominal: Soft, nondistended, nontender. Bowel sounds active throughout. There are no masses palpable. No hepatomegaly. Extremities: no edema Lymphadenopathy: No cervical adenopathy noted. Neurological: Alert and oriented to person place and time. Skin: Skin is warm and dry. No rashes noted. Psychiatric: Normal mood and affect. Behavior is normal.   ASSESSMENT AND PLAN:  19. 63 year old female with transaminitis. Recheck  LFTs and if still elevated will obtain ultrasound of the abdomen as well as additional lab studies  to check for hepatitis B., C and autoimmune hepatitis. If LFTs continue to rise will need more extensive liver workup and will need to think about holding lipitor..    2. Bowel changes. Constipation over the last month, improved with MiraLax. Her last colonoscopy was reportedly in 2002 though we couldn't locate a report. She is due for screening colonoscopy which will also be done to evaluate weight loss and new onset constipation. The risks, benefits, and alternatives to colonoscopy with possible biopsy and possible polypectomy were discussed with the patient and she consents to proceed.    Addendum 11/25/13. Notes from [facility]  EGD (Dr. Arlana Pouch) 01/17/11 for dysphagia. 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 Benign appearing mild stenosis at GEJ. Balloon dilation done. A 9mm sessile duodenal polyp was removed.  Polyp biopsy c/w brunner gland hyperplasia. Gastric biopsy c/w evolving / resolving benign gastric polyp. Esophageal biopsy was normal.   Colonoscopy 2002. Findings: largely normal study with a slightly incomplete view of cecum and a small polyp in left colon. Path c/w amorphous material.

## 2013-08-24 NOTE — Patient Instructions (Addendum)
Please go to the basement level to have your labs drawn.  You have been scheduled for a colonoscopy. Please follow written instructions given to you at your visit today.  We have given you a coupon for the miralax. If you use inhalers (even only as needed), please bring them with you on the day of your procedure. Your physician has requested that you go to www.startemmi.com and enter the access code given to you at your visit today. This web site gives a general overview about your procedure. However, you should still follow specific instructions given to you by our office regarding your preparation for the procedure.

## 2013-08-25 ENCOUNTER — Other Ambulatory Visit: Payer: Self-pay | Admitting: *Deleted

## 2013-08-25 DIAGNOSIS — R7989 Other specified abnormal findings of blood chemistry: Secondary | ICD-10-CM | POA: Insufficient documentation

## 2013-08-25 DIAGNOSIS — R109 Unspecified abdominal pain: Secondary | ICD-10-CM

## 2013-08-25 DIAGNOSIS — R945 Abnormal results of liver function studies: Secondary | ICD-10-CM

## 2013-08-25 DIAGNOSIS — R634 Abnormal weight loss: Secondary | ICD-10-CM

## 2013-08-25 DIAGNOSIS — R194 Change in bowel habit: Secondary | ICD-10-CM | POA: Insufficient documentation

## 2013-08-28 ENCOUNTER — Ambulatory Visit (HOSPITAL_COMMUNITY): Payer: Medicare Other

## 2013-08-31 ENCOUNTER — Ambulatory Visit (HOSPITAL_COMMUNITY): Admission: RE | Admit: 2013-08-31 | Payer: Medicare Other | Source: Ambulatory Visit

## 2013-08-31 NOTE — Progress Notes (Signed)
Agree with Ms. Guenther's assessment and plan. Carl E. Gessner, MD, FACG   

## 2013-09-05 ENCOUNTER — Encounter (HOSPITAL_COMMUNITY): Payer: Self-pay | Admitting: Emergency Medicine

## 2013-09-05 ENCOUNTER — Emergency Department (HOSPITAL_COMMUNITY)
Admission: EM | Admit: 2013-09-05 | Discharge: 2013-09-05 | Disposition: A | Discharge status: Other Acute Inpatient Hospital | Payer: Medicare Other | Attending: Emergency Medicine | Admitting: Emergency Medicine

## 2013-09-05 DIAGNOSIS — N390 Urinary tract infection, site not specified: Secondary | ICD-10-CM | POA: Diagnosis not present

## 2013-09-05 DIAGNOSIS — F3113 Bipolar disorder, current episode manic without psychotic features, severe: Secondary | ICD-10-CM | POA: Diagnosis not present

## 2013-09-05 DIAGNOSIS — R45850 Homicidal ideations: Secondary | ICD-10-CM | POA: Insufficient documentation

## 2013-09-05 DIAGNOSIS — J449 Chronic obstructive pulmonary disease, unspecified: Secondary | ICD-10-CM | POA: Insufficient documentation

## 2013-09-05 DIAGNOSIS — Z79899 Other long term (current) drug therapy: Secondary | ICD-10-CM | POA: Insufficient documentation

## 2013-09-05 DIAGNOSIS — F17200 Nicotine dependence, unspecified, uncomplicated: Secondary | ICD-10-CM | POA: Insufficient documentation

## 2013-09-05 DIAGNOSIS — F329 Major depressive disorder, single episode, unspecified: Secondary | ICD-10-CM | POA: Diagnosis present

## 2013-09-05 DIAGNOSIS — J4489 Other specified chronic obstructive pulmonary disease: Secondary | ICD-10-CM | POA: Insufficient documentation

## 2013-09-05 DIAGNOSIS — Z8739 Personal history of other diseases of the musculoskeletal system and connective tissue: Secondary | ICD-10-CM | POA: Insufficient documentation

## 2013-09-05 DIAGNOSIS — E119 Type 2 diabetes mellitus without complications: Secondary | ICD-10-CM | POA: Insufficient documentation

## 2013-09-05 DIAGNOSIS — I10 Essential (primary) hypertension: Secondary | ICD-10-CM | POA: Diagnosis not present

## 2013-09-05 DIAGNOSIS — E785 Hyperlipidemia, unspecified: Secondary | ICD-10-CM | POA: Insufficient documentation

## 2013-09-05 DIAGNOSIS — Z794 Long term (current) use of insulin: Secondary | ICD-10-CM | POA: Diagnosis not present

## 2013-09-05 DIAGNOSIS — F32A Depression, unspecified: Secondary | ICD-10-CM | POA: Diagnosis present

## 2013-09-05 LAB — RAPID URINE DRUG SCREEN, HOSP PERFORMED
Amphetamines: NOT DETECTED
BARBITURATES: NOT DETECTED
BENZODIAZEPINES: NOT DETECTED
Cocaine: NOT DETECTED
Opiates: NOT DETECTED
Tetrahydrocannabinol: NOT DETECTED

## 2013-09-05 LAB — COMPREHENSIVE METABOLIC PANEL
ALT: 31 U/L (ref 0–35)
AST: 30 U/L (ref 0–37)
Albumin: 4.5 g/dL (ref 3.5–5.2)
Alkaline Phosphatase: 89 U/L (ref 39–117)
Anion gap: 15 (ref 5–15)
BILIRUBIN TOTAL: 0.5 mg/dL (ref 0.3–1.2)
BUN: 15 mg/dL (ref 6–23)
CALCIUM: 10.1 mg/dL (ref 8.4–10.5)
CHLORIDE: 100 meq/L (ref 96–112)
CO2: 24 meq/L (ref 19–32)
Creatinine, Ser: 0.94 mg/dL (ref 0.50–1.10)
GFR, EST AFRICAN AMERICAN: 74 mL/min — AB (ref >90)
GFR, EST NON AFRICAN AMERICAN: 64 mL/min — AB (ref >90)
GLUCOSE: 154 mg/dL — AB (ref 70–99)
Potassium: 3.9 mEq/L (ref 3.7–5.3)
Sodium: 139 mEq/L (ref 137–147)
Total Protein: 7.7 g/dL (ref 6.0–8.3)

## 2013-09-05 LAB — CBC
HCT: 43.9 % (ref 36.0–46.0)
HEMOGLOBIN: 14.1 g/dL (ref 12.0–15.0)
MCH: 25.9 pg — ABNORMAL LOW (ref 26.0–34.0)
MCHC: 32.1 g/dL (ref 30.0–36.0)
MCV: 80.6 fL (ref 78.0–100.0)
Platelets: 221 10*3/uL (ref 150–400)
RBC: 5.45 MIL/uL — AB (ref 3.87–5.11)
RDW: 14 % (ref 11.5–15.5)
WBC: 9.9 10*3/uL (ref 4.0–10.5)

## 2013-09-05 LAB — ETHANOL: Alcohol, Ethyl (B): <11 mg/dL (ref 0–11)

## 2013-09-05 LAB — CBG MONITORING, ED
GLUCOSE-CAPILLARY: 146 mg/dL — AB (ref 70–99)
GLUCOSE-CAPILLARY: 93 mg/dL (ref 70–99)
Glucose-Capillary: 118 mg/dL — ABNORMAL HIGH (ref 70–99)

## 2013-09-05 LAB — LITHIUM LEVEL: LITHIUM LVL: 0.58 meq/L — AB (ref 0.80–1.40)

## 2013-09-05 LAB — ACETAMINOPHEN LEVEL: Acetaminophen (Tylenol), Serum: <15 ug/mL (ref 10–30)

## 2013-09-05 LAB — SALICYLATE LEVEL

## 2013-09-05 MED ORDER — INSULIN GLARGINE 100 UNIT/ML ~~LOC~~ SOLN
10.0000 [IU] | Freq: Two times a day (BID) | SUBCUTANEOUS | Status: DC
Start: 2013-09-05 — End: 2013-09-05

## 2013-09-05 MED ORDER — INSULIN GLARGINE 100 UNIT/ML ~~LOC~~ SOLN
30.0000 [IU] | Freq: Every day | SUBCUTANEOUS | Status: DC
  Filled 2013-09-05: qty 0.3

## 2013-09-05 MED ORDER — INSULIN GLARGINE 100 UNIT/ML ~~LOC~~ SOLN: 10.0000 [IU] | Freq: Two times a day (BID) | SUBCUTANEOUS | Status: DC

## 2013-09-05 MED ORDER — NICOTINE 21 MG/24HR TD PT24
21.0000 mg | MEDICATED_PATCH | Freq: Every day | TRANSDERMAL | Status: DC
  Administered 2013-09-05: 21 mg via TRANSDERMAL
  Filled 2013-09-05: qty 1

## 2013-09-05 MED ORDER — INSULIN ASPART 100 UNIT/ML ~~LOC~~ SOLN: 0.0000 [IU] | Freq: Three times a day (TID) | SUBCUTANEOUS | Status: DC

## 2013-09-05 MED ORDER — INSULIN GLARGINE 100 UNIT/ML ~~LOC~~ SOLN: 10.0000 [IU] | Freq: Every day | SUBCUTANEOUS | Status: DC

## 2013-09-05 MED ORDER — VITAMIN D (ERGOCALCIFEROL) 1.25 MG (50000 UNIT) PO CAPS: 50000.0000 [IU] | ORAL_CAPSULE | ORAL | Status: DC

## 2013-09-05 MED ORDER — INSULIN ASPART 100 UNIT/ML ~~LOC~~ SOLN
10.0000 [IU] | Freq: Two times a day (BID) | SUBCUTANEOUS | Status: DC
  Administered 2013-09-05: 10 [IU] via SUBCUTANEOUS
  Filled 2013-09-05: qty 1

## 2013-09-05 MED ORDER — ALUM & MAG HYDROXIDE-SIMETH 200-200-20 MG/5ML PO SUSP: 30.0000 mL | ORAL | Status: DC | PRN

## 2013-09-05 MED ORDER — ZOLPIDEM TARTRATE 5 MG PO TABS: 5.0000 mg | ORAL_TABLET | Freq: Every evening | ORAL | Status: DC | PRN

## 2013-09-05 MED ORDER — IBUPROFEN 200 MG PO TABS: 600.0000 mg | ORAL_TABLET | Freq: Three times a day (TID) | ORAL | Status: DC | PRN

## 2013-09-05 MED ORDER — LITHIUM CARBONATE ER 450 MG PO TBCR
450.0000 mg | EXTENDED_RELEASE_TABLET | Freq: Two times a day (BID) | ORAL | Status: DC
  Administered 2013-09-05: 450 mg via ORAL
  Filled 2013-09-05 (×2): qty 1

## 2013-09-05 MED ORDER — ATORVASTATIN CALCIUM 20 MG PO TABS
20.0000 mg | ORAL_TABLET | Freq: Every day | ORAL | Status: DC
  Filled 2013-09-05: qty 1

## 2013-09-05 MED ORDER — LORAZEPAM 1 MG PO TABS
1.0000 mg | ORAL_TABLET | Freq: Three times a day (TID) | ORAL | Status: DC | PRN
  Administered 2013-09-05: 1 mg via ORAL
  Filled 2013-09-05: qty 1

## 2013-09-05 MED ORDER — ACETAMINOPHEN 325 MG PO TABS: 650.0000 mg | ORAL_TABLET | ORAL | Status: DC | PRN

## 2013-09-05 MED ORDER — ONDANSETRON HCL 4 MG PO TABS: 4.0000 mg | ORAL_TABLET | Freq: Three times a day (TID) | ORAL | Status: DC | PRN

## 2013-09-05 MED ORDER — POLYETHYLENE GLYCOL 3350 17 G PO PACK
17.0000 g | PACK | Freq: Every day | ORAL | Status: DC
  Administered 2013-09-05: 17 g via ORAL
  Filled 2013-09-05 (×2): qty 1

## 2013-09-05 MED ORDER — CLONIDINE HCL 0.1 MG PO TABS: 0.1000 mg | ORAL_TABLET | Freq: Once | ORAL | Status: DC

## 2013-09-05 MED ORDER — INSULIN GLARGINE 100 UNIT/ML ~~LOC~~ SOLN
10.0000 [IU] | Freq: Every day | SUBCUTANEOUS | Status: DC
  Administered 2013-09-05: 10 [IU] via SUBCUTANEOUS
  Filled 2013-09-05: qty 0.1

## 2013-09-05 NOTE — ED Notes (Signed)
Pt was transferred to [facility] in [place] by the [employer]. Report was given to Osie Bond, RN. Pt was ambulatory and ready for transferred. She signed for her belongings and paperwork was given to [place].
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
## 2013-09-05 NOTE — ED Provider Notes (Signed)
CSN: [id number]     Arrival date & time 09/05/13  3244 History   First MD Initiated Contact with Patient 09/05/13 [phone]     Chief Complaint  Patient presents with  . Homicidal     (Consider location/radiation/quality/duration/timing/severity/associated sxs/prior Treatment) HPI Comments: Patient is a 63 year old female with history of diabetes, COPD, bipolar depression, hypertension, hyperlipidemia who presents to the emergency department today under IVC paperwork. Reports that she does not know why she is here. She states "I wasn't doing nothing". Per [employer] her son took out IVC paperwork because she was threatening to kill herself and her children. She denies suicidal or homicidal ideation to me. She takes lithium and states that she last took her lithium yesterday. When asked about hallucinations she states, "probably because no one else hears God". She denies alcohol or drug use. She has no physical complaints.   The history is provided by the patient. No language interpreter was used.    Past Medical History  Diagnosis Date  . Diabetes mellitus without complication   . COPD (chronic obstructive pulmonary disease)   . Arthritis   . Bipolar depression   . Elevated LFTs   . Hypertension   . Hyperlipemia   . UTI (lower urinary tract infection)    Past Surgical History  Procedure Laterality Date  . Tonsillectomy    . Tubal ligation     Family History  Problem Relation Age of Onset  . Colon cancer Neg Hx   . Stomach cancer Neg Hx   . Esophageal cancer Neg Hx    History  Substance Use Topics  . Smoking status: Current Some Day Smoker  . Smokeless tobacco: Never Used  . Alcohol Use: No   OB History   Grav Para Term Preterm Abortions TAB SAB Ect Mult Living                 Review of Systems  Constitutional: Negative for fever and chills.  Respiratory: Negative for shortness of breath.   Cardiovascular: Negative for chest pain.  Gastrointestinal: Negative for nausea,  vomiting and abdominal pain.  Psychiatric/Behavioral: Negative for suicidal ideas.  All other systems reviewed and are negative.     Allergies  Review of patient's allergies indicates no known allergies.  Home Medications   Prior to Admission medications   Medication Sig Start Date End Date Taking? Authorizing Provider  atorvastatin (LIPITOR) 20 MG tablet Take 20 mg by mouth every morning.     Historical Provider, MD  insulin glargine (LANTUS) 100 UNIT/ML injection Inject 10-30 Units into the skin 2 (two) times daily. 10 units every morning; 30 units at bedtime.    Historical Provider, MD  insulin lispro (HUMALOG) 100 UNIT/ML injection Inject 10 Units into the skin 2 (two) times daily. 10 units before breakfast and before dinner. Sliding scale if blood sugar over 200.    Historical Provider, MD  lithium carbonate (ESKALITH) 450 MG CR tablet Take 450 mg by mouth 2 (two) times daily.    Historical Provider, MD  phenazopyridine (PYRIDIUM) 200 MG tablet Take 1 tablet (200 mg total) by mouth 3 (three) times daily as needed (for painful urination). 08/04/13   Freeman Caldron Baker, PA-C  polyethylene glycol [facility]) packet Take 17 g by mouth daily. 08/04/13   Liam Graham, PA-C  Vitamin D, Ergocalciferol, (DRISDOL) 50000 UNITS CAPS capsule Take 50,000 Units by mouth every 7 (seven) days.    Historical Provider, MD   BP 132/101  Pulse 91  Temp(Src)  98.5 F (36.9 C) (Oral)  Resp 15  Ht 5\' 3"  (1.6 m)  Wt 142 lb (64.411 kg)  BMI 25.16 kg/m2  SpO2 98% Physical Exam  Nursing note and vitals reviewed. Constitutional: She is oriented to person, place, and time. She appears well-developed and well-nourished. No distress.  HENT:  Head: Normocephalic and atraumatic.  Right Ear: External ear normal.  Left Ear: External ear normal.  Nose: Nose normal.  Mouth/Throat: Oropharynx is clear and moist.  Eyes: Conjunctivae are normal.  Neck: Normal range of motion.  Cardiovascular: Normal rate, regular  rhythm and normal heart sounds.   Pulmonary/Chest: Effort normal and breath sounds normal. No stridor. No respiratory distress. She has no wheezes. She has no rales.  Abdominal: Soft. She exhibits no distension.  Musculoskeletal: Normal range of motion.  Neurological: She is alert and oriented to person, place, and time. She has normal strength.  Skin: Skin is warm and dry. She is not diaphoretic. No erythema.  Psychiatric: She has a normal mood and affect. She is withdrawn.    ED Course  Procedures (including critical care time) Labs Review Labs Reviewed  CBC - Abnormal; Notable for the following:    RBC 5.45 (*)    MCH 25.9 (*)    All other components within normal limits  COMPREHENSIVE METABOLIC PANEL - Abnormal; Notable for the following:    Glucose, Bld 154 (*)    GFR calc non Af Amer 64 (*)    GFR calc Af Amer 74 (*)    All other components within normal limits  SALICYLATE LEVEL - Abnormal; Notable for the following:    Salicylate Lvl <7.8 (*)    All other components within normal limits  LITHIUM LEVEL - Abnormal; Notable for the following:    Lithium Lvl 0.58 (*)    All other components within normal limits  CBG MONITORING, ED - Abnormal; Notable for the following:    Glucose-Capillary 146 (*)    All other components within normal limits  CBG MONITORING, ED - Abnormal; Notable for the following:    Glucose-Capillary 118 (*)    All other components within normal limits  ACETAMINOPHEN LEVEL  ETHANOL  URINE RAPID DRUG SCREEN (HOSP PERFORMED)  URINALYSIS, ROUTINE W REFLEX MICROSCOPIC  CBG MONITORING, ED    Imaging Review No results found.   EKG Interpretation None      MDM   Final diagnoses:  Bipolar disorder, current episode manic without psychotic features, severe    Patient presents to the emergency department under IVC paperwork. She has been making threats to kill herself and her children. To me patient denies suicidal or homicidal ideation. She denies  any physical complaints. Lithium level low. Labs are otherwise unremarkable. Psychiatry to see. Patient is medically clear.    Elwyn Lade, PA-C 09/05/13 [phone]

## 2013-09-05 NOTE — ED Notes (Signed)
Reluctant to talk during assessment. Suspicious with meds this morning-initially refused, but eventually agreed to take.

## 2013-09-05 NOTE — Progress Notes (Signed)
The following facilities have been contacted on pt's behalf regarding inptx bed availability:  REFERRAL FAXED: [place]- per [place] beds available [facility]- per Joellen Jersey can fax [facility]- per Tomi Bamberger can fax [facility]- per [place] beds available [facility]- per Merry Proud accepting referrals [facility]- per [place] female bed available [facility]- per Jinny Blossom d/c's today [facility]- per Shirlee Limerick beds available [facility]- per [place] beds available Sharl[facility] Motts- per Tammy can fax for review [facility] Cam[facility]  AT CAPACITY: [facility]- per Kasandra Knudsen at capacity [facility]- per Anice Paganini- per Judeth Porch- per [facility]- per [facility]- per Rosalee Kaufman- per Haroldine Laws- per Seth Bake, unsure about bed status [facility]- per Aurelio Brash- per Junie Panning [facility]- per [facility]- per [facility]- per Adriana Simas- per [facility]- per [facility] Disposition [facility]
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
## 2013-09-05 NOTE — Progress Notes (Addendum)
Received phone call back from [place] at [facility] in [place], [place] stating that pt has been accepted by Dr. Kerrin Champagne.  Nurse to nurse report number is [phone] and would prefer report be called when pt is on the way to their facility.  If pt can not be transported by 11pm tonight then pt will not be able to transport until after 6am tomorrow (Sunday 8.30.15).  Will call [facility] and update on new information.   Rick Duff Disposition MHT
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
## 2013-09-05 NOTE — ED Notes (Signed)
Clinician spoke to patient's son Leyton Brownlee [phone]) to inform him of patient being accepted to [facility] for inpatient treatment. Mr. Degrasse stated that he will inform his sister so that she can bring some clothes for patient to have at [facility]. No other concerns verbalized.   Boyce Medici. MSW, LCSW Therapeutic Triage Services-Triage Specialist   Phone: [phone] Fax: [phone]

## 2013-09-05 NOTE — ED Notes (Signed)
Ice water given per request. Resting comfortably. Denies SI, HI.

## 2013-09-05 NOTE — ED Notes (Signed)
Clinician telephoned patient's son to notify him that sheriff will be at [facility] in approx 30 minutes. Voicemail left requesting patient's son to call the unit back if he has any questions or concerns.   Boyce Medici. MSW, LCSW Therapeutic Triage Services-Triage Specialist   Phone: [phone] Fax: [phone]

## 2013-09-05 NOTE — ED Notes (Signed)
Pt changed into scrubs, wanded, urine sample collected. Pt insisting I calculate her BMI, attempted to advise pt I would need to check chart for that info, pt became slightly irritated at the delay

## 2013-09-06 NOTE — ED Provider Notes (Signed)
Medical screening examination/treatment/procedure(s) were performed by non-physician practitioner and as supervising physician I was immediately available for consultation/collaboration.    Johnna Acosta, MD 09/06/13 [phone]

## 2013-09-17 ENCOUNTER — Telehealth: Payer: Self-pay | Admitting: *Deleted

## 2013-09-17 NOTE — Telephone Encounter (Signed)
Message copied by Hulan Saas on Thu Sep 17, 2013  8:17 AM ------      Message from: Hulan Saas      Created: Tue Aug 25, 2013  4:58 PM       Call and remind patient due for LFT on 09/21/13. PG lab in epic. ------

## 2013-09-17 NOTE — Telephone Encounter (Signed)
Left a message for patient to call back. 

## 2013-09-21 ENCOUNTER — Other Ambulatory Visit (INDEPENDENT_AMBULATORY_CARE_PROVIDER_SITE_OTHER): Payer: Medicare Other

## 2013-09-21 DIAGNOSIS — R634 Abnormal weight loss: Secondary | ICD-10-CM

## 2013-09-21 DIAGNOSIS — R109 Unspecified abdominal pain: Secondary | ICD-10-CM

## 2013-09-21 DIAGNOSIS — R945 Abnormal results of liver function studies: Secondary | ICD-10-CM

## 2013-09-21 DIAGNOSIS — R7989 Other specified abnormal findings of blood chemistry: Secondary | ICD-10-CM

## 2013-09-21 LAB — HEPATIC FUNCTION PANEL
ALT: 60 U/L — ABNORMAL HIGH (ref 0–35)
AST: 29 U/L (ref 0–37)
Albumin: 3.6 g/dL (ref 3.5–5.2)
Alkaline Phosphatase: 71 U/L (ref 39–117)
BILIRUBIN TOTAL: 0.3 mg/dL (ref 0.2–1.2)
Bilirubin, Direct: 0.1 mg/dL (ref 0.0–0.3)
Total Protein: 7 g/dL (ref 6.0–8.3)

## 2013-09-21 NOTE — Telephone Encounter (Signed)
Spoke with patient and she will come for labs today.

## 2013-09-23 ENCOUNTER — Telehealth: Payer: Self-pay | Admitting: Internal Medicine

## 2013-09-23 NOTE — Telephone Encounter (Signed)
Patient is asking for lab results.  Please review and advise

## 2013-09-24 NOTE — Telephone Encounter (Signed)
Kathryn Mcguire,  Please let patient know that one of her liver enzymes is mildly elevated. Given ongoing LFT abnormalities we should obtain additional labs. Lets start with  HCV antibody HBsurf antigen  And an ANA.   thanks

## 2013-09-25 ENCOUNTER — Other Ambulatory Visit: Payer: Medicare Other

## 2013-09-25 ENCOUNTER — Other Ambulatory Visit: Payer: Self-pay

## 2013-09-25 DIAGNOSIS — R945 Abnormal results of liver function studies: Principal | ICD-10-CM

## 2013-09-25 DIAGNOSIS — R7989 Other specified abnormal findings of blood chemistry: Secondary | ICD-10-CM

## 2013-09-25 NOTE — Telephone Encounter (Signed)
Patient notified Labs ordered She will come one day last week

## 2013-09-25 NOTE — Telephone Encounter (Signed)
Left message for patient to call back  

## 2013-09-26 LAB — HEPATITIS C ANTIBODY: HCV AB: NEGATIVE

## 2013-09-26 LAB — HEPATITIS B SURFACE ANTIGEN: Hepatitis B Surface Ag: NEGATIVE

## 2013-09-28 LAB — ANA: Anti Nuclear Antibody(ANA): NEGATIVE

## 2013-10-01 ENCOUNTER — Other Ambulatory Visit: Payer: Self-pay

## 2013-10-01 DIAGNOSIS — Z1231 Encounter for screening mammogram for malignant neoplasm of breast: Secondary | ICD-10-CM

## 2013-10-02 ENCOUNTER — Telehealth: Payer: Self-pay | Admitting: Internal Medicine

## 2013-10-02 NOTE — Telephone Encounter (Signed)
Patient notified of normal labs.   

## 2013-10-08 ENCOUNTER — Ambulatory Visit
Admission: RE | Admit: 2013-10-08 | Discharge: 2013-10-08 | Disposition: A | Discharge status: Home / Self Care | Payer: Medicare Other | Source: Ambulatory Visit

## 2013-10-08 DIAGNOSIS — Z1231 Encounter for screening mammogram for malignant neoplasm of breast: Secondary | ICD-10-CM

## 2013-10-15 ENCOUNTER — Encounter: Payer: Self-pay | Admitting: Internal Medicine

## 2013-10-15 ENCOUNTER — Ambulatory Visit (AMBULATORY_SURGERY_CENTER): Payer: Medicare Other | Admitting: Internal Medicine

## 2013-10-15 VITALS — BP 148/76 | HR 76 | Temp 97.0°F | Resp 33 | Ht 63.0 in | Wt 142.0 lb

## 2013-10-15 DIAGNOSIS — R194 Change in bowel habit: Secondary | ICD-10-CM

## 2013-10-15 DIAGNOSIS — D123 Benign neoplasm of transverse colon: Secondary | ICD-10-CM

## 2013-10-15 DIAGNOSIS — Z860101 Personal history of adenomatous and serrated colon polyps: Secondary | ICD-10-CM

## 2013-10-15 DIAGNOSIS — Z86010 Personal history of colonic polyps: Secondary | ICD-10-CM

## 2013-10-15 HISTORY — DX: Personal history of adenomatous and serrated colon polyps: Z86.0101

## 2013-10-15 HISTORY — DX: Personal history of colonic polyps: Z86.010

## 2013-10-15 LAB — GLUCOSE, CAPILLARY
GLUCOSE-CAPILLARY: 123 mg/dL — AB (ref 70–99)
Glucose-Capillary: 114 mg/dL — ABNORMAL HIGH (ref 70–99)

## 2013-10-15 MED ORDER — SODIUM CHLORIDE 0.9 % IV SOLN: 500.0000 mL | INTRAVENOUS | Status: DC

## 2013-10-15 MED ORDER — FLEET ENEMA 7-19 GM/118ML RE ENEM
1.0000 | ENEMA | Freq: Once | RECTAL | Status: AC
  Administered 2013-10-15: 1 via RECTAL

## 2013-10-15 NOTE — Progress Notes (Signed)
Pt reported brown colored liquid with sediment on last result.  Margie Ege, RN informed Dr. Silvano Rusk.  He ordered fleet enema till clear.  Fleet enema per rectum, I assisted the pt with enema.  Results was clear water like fluid.  maw

## 2013-10-15 NOTE — Op Note (Signed)
[place]  [address] [place], [address]   COLONOSCOPY PROCEDURE REPORT  PATIENT: Kathryn, Mcguire  MR#: [id number] BIRTHDATE: 08-Feb-1950 , 62  yrs. old GENDER: female ENDOSCOPIST: Gatha Mayer, MD, [facility] PROCEDURE DATE:  10/15/2013 PROCEDURE:   Colonoscopy with cold biopsy polypectomy and Colonoscopy with snare polypectomy First Screening Colonoscopy - Avg.  risk and is 50 yrs.  old or older - No.  Prior Negative Screening - Now for repeat screening. N/A ASA CLASS:   Class III INDICATIONS:change in bowel habits. MEDICATIONS: Propofol 220 mg IV and Monitored anesthesia care  DESCRIPTION OF PROCEDURE:   After the risks benefits and alternatives of the procedure were thoroughly explained, informed consent was obtained.  The digital rectal exam revealed no abnormalities of the rectum.   The LB [id number]  endoscope was introduced through the anus and advanced to the cecum, which was identified by both the appendix and ileocecal valve. No adverse events experienced.   The quality of the prep was good, using MiraLax  The instrument was then slowly withdrawn as the colon was fully examined.      COLON FINDINGS: Five sessile polyps ranging from 2 to 23mm in size were found in the transverse colon.  Polypectomies were performed with a cold snare(2) using snare cautery (2) and with cold forceps (1).  The resection was complete, the polyp tissue was completely retrieved and sent to histology.   The examination was otherwise normal.  Retroflexed views revealed no abnormalities. The time to cecum=6 minutes 24 seconds.  Withdrawal time=15 minutes 18 seconds. The scope was withdrawn and the procedure completed. COMPLICATIONS: There were no immediate complications.  ENDOSCOPIC IMPRESSION: 1.   Five sessile polyps ranging from 2 to 65mm in size were found in the transverse colon; polypectomies were performed with a cold snare, using snare cautery and with  cold forceps 2.   The examination was otherwise normal - good prep  RECOMMENDATIONS: 1.  Hold Aspirin and all other NSAIDS for 2 weeks. 2.  Timing of repeat colonoscopy will be determined by pathology findings. 3.  Use MiraLax 2-3 times a day and take Dulcolax if no bowel movement in 3 days  eSigned:  Gatha Mayer, MD, [facility] 10/15/2013 2:27 PM cc: The Patient

## 2013-10-15 NOTE — Progress Notes (Signed)
Report to PACU, RN, vss, BBS= Clear.  

## 2013-10-15 NOTE — Patient Instructions (Addendum)
I found and removed 5 polyps that look benign.  Please go back to MiraLax but take more (2-3x/day) and intermittent Dulcolax.  I will let you know pathology results and when to have another routine colonoscopy by mail.  I appreciate the opportunity to care for you. Gatha Mayer, MD, FACG  YOU HAD AN ENDOSCOPIC PROCEDURE TODAY AT [facility]: Refer to the procedure report that was given to you for any specific questions about what was found during the examination.  If the procedure report does not answer your questions, please call your gastroenterologist to clarify.  If you requested that your care partner not be given the details of your procedure findings, then the procedure report has been included in a sealed envelope for you to review at your convenience later.  YOU SHOULD EXPECT: Some feelings of bloating in the abdomen. Passage of more gas than usual.  Walking can help get rid of the air that was put into your GI tract during the procedure and reduce the bloating. If you had a lower endoscopy (such as a colonoscopy or flexible sigmoidoscopy) you may notice spotting of blood in your stool or on the toilet paper. If you underwent a bowel prep for your procedure, then you may not have a normal bowel movement for a few days.  DIET: Your first meal following the procedure should be a light meal and then it is ok to progress to your normal diet.  A half-sandwich or bowl of soup is an example of a good first meal.  Heavy or fried foods are harder to digest and may make you feel nauseous or bloated.  Likewise meals heavy in dairy and vegetables can cause extra gas to form and this can also increase the bloating.  Drink plenty of fluids but you should avoid alcoholic beverages for 24 hours.  ACTIVITY: Your care partner should take you home directly after the procedure.  You should plan to take it easy, moving slowly for the rest of the day.  You can resume normal activity the day  after the procedure however you should NOT DRIVE or use heavy machinery for 24 hours (because of the sedation medicines used during the test).    SYMPTOMS TO REPORT IMMEDIATELY: A gastroenterologist can be reached at any hour.  During normal business hours, 8:30 AM to 5:00 PM Monday through Friday, call [phone].  After hours and on weekends, please call the GI answering service at [phone] who will take a message and have the physician on call contact you.   Following lower endoscopy (colonoscopy or flexible sigmoidoscopy):  Excessive amounts of blood in the stool  Significant tenderness or worsening of abdominal pains  Swelling of the abdomen that is new, acute  Fever of 100F or higher  FOLLOW UP: If any biopsies were taken you will be contacted by phone or by letter within the next 1-3 weeks.  Call your gastroenterologist if you have not heard about the biopsies in 3 weeks.  Our staff will call the home number listed on your records the next business day following your procedure to check on you and address any questions or concerns that you may have at that time regarding the information given to you following your procedure. This is a courtesy call and so if there is no answer at the home number and we have not heard from you through the emergency physician on call, we will assume that you have returned to your regular  daily activities without incident.  SIGNATURES/CONFIDENTIALITY: You and/or your care partner have signed paperwork which will be entered into your electronic medical record.  These signatures attest to the fact that that the information above on your After Visit Summary has been reviewed and is understood.  Full responsibility of the confidentiality of this discharge information lies with you and/or your care-partner.  Await pathology  HOLD ASPIRIN, ASPIRIN CONTAINING PRODUCTS (GOODY POWDER, BC POWDER), NSAIDS (IBUPROFEN, ADVIL, ALEVE, MOTRIN) FOR 2 WEEKS, TYLENOL  IS OK  USE MIRALAX 2-3 TIMES A DAY AND TAKE DULCOLAX IF NO BOWEL MOVEMENT IN 3 DAYS

## 2013-10-15 NOTE — Progress Notes (Signed)
Called to room to assist during endoscopic procedure.  Patient ID and intended procedure confirmed with present staff. Received instructions for my participation in the procedure from the performing physician.  

## 2013-10-16 ENCOUNTER — Telehealth: Payer: Self-pay | Admitting: *Deleted

## 2013-10-16 NOTE — Telephone Encounter (Signed)
  Follow up Call-  Call back number 10/15/2013  Post procedure Call Back phone  # [phone] cell  Permission to leave phone message Yes     Patient questions:  Do you have a fever, pain , or abdominal swelling? No. Pain Score  0 *  Have you tolerated food without any problems? Yes.    Have you been able to return to your normal activities? Yes.    Do you have any questions about your discharge instructions: Diet   No. Medications  No. Follow up visit  No.  Do you have questions or concerns about your Care? No.  Actions: * If pain score is 4 or above: No action needed, pain <4.

## 2013-10-19 ENCOUNTER — Ambulatory Visit: Payer: Medicare Other | Admitting: Gastroenterology

## 2013-10-21 ENCOUNTER — Encounter: Payer: Self-pay | Admitting: Internal Medicine

## 2013-10-21 NOTE — Progress Notes (Signed)
Quick Note:  4 adenomas max 15 mm - repeat colon 2018 ______

## 2014-01-06 DIAGNOSIS — E559 Vitamin D deficiency, unspecified: Secondary | ICD-10-CM | POA: Insufficient documentation

## 2014-01-07 DIAGNOSIS — Z794 Long term (current) use of insulin: Secondary | ICD-10-CM | POA: Insufficient documentation

## 2014-01-18 ENCOUNTER — Encounter (HOSPITAL_COMMUNITY): Payer: Self-pay | Admitting: Emergency Medicine

## 2014-01-18 ENCOUNTER — Emergency Department (HOSPITAL_COMMUNITY)
Admission: EM | Admit: 2014-01-18 | Discharge: 2014-01-20 | Disposition: A | Discharge status: Other Acute Inpatient Hospital | Payer: Medicare Other | Attending: Emergency Medicine | Admitting: Emergency Medicine

## 2014-01-18 DIAGNOSIS — F99 Mental disorder, not otherwise specified: Secondary | ICD-10-CM | POA: Diagnosis not present

## 2014-01-18 DIAGNOSIS — Z8739 Personal history of other diseases of the musculoskeletal system and connective tissue: Secondary | ICD-10-CM | POA: Diagnosis not present

## 2014-01-18 DIAGNOSIS — I10 Essential (primary) hypertension: Secondary | ICD-10-CM | POA: Diagnosis not present

## 2014-01-18 DIAGNOSIS — F319 Bipolar disorder, unspecified: Secondary | ICD-10-CM | POA: Diagnosis present

## 2014-01-18 DIAGNOSIS — Z91148 Patient's other noncompliance with medication regimen for other reason: Secondary | ICD-10-CM | POA: Insufficient documentation

## 2014-01-18 DIAGNOSIS — Z794 Long term (current) use of insulin: Secondary | ICD-10-CM | POA: Insufficient documentation

## 2014-01-18 DIAGNOSIS — E1165 Type 2 diabetes mellitus with hyperglycemia: Secondary | ICD-10-CM | POA: Diagnosis not present

## 2014-01-18 DIAGNOSIS — Z720 Tobacco use: Secondary | ICD-10-CM | POA: Insufficient documentation

## 2014-01-18 DIAGNOSIS — Z5189 Encounter for other specified aftercare: Secondary | ICD-10-CM | POA: Diagnosis not present

## 2014-01-18 DIAGNOSIS — Z87440 Personal history of urinary (tract) infections: Secondary | ICD-10-CM | POA: Insufficient documentation

## 2014-01-18 DIAGNOSIS — Z9119 Patient's noncompliance with other medical treatment and regimen: Secondary | ICD-10-CM | POA: Insufficient documentation

## 2014-01-18 DIAGNOSIS — E785 Hyperlipidemia, unspecified: Secondary | ICD-10-CM | POA: Insufficient documentation

## 2014-01-18 DIAGNOSIS — Z86010 Personal history of colonic polyps: Secondary | ICD-10-CM | POA: Insufficient documentation

## 2014-01-18 DIAGNOSIS — Z9114 Patient's other noncompliance with medication regimen: Secondary | ICD-10-CM

## 2014-01-18 DIAGNOSIS — R739 Hyperglycemia, unspecified: Secondary | ICD-10-CM

## 2014-01-18 DIAGNOSIS — Z79899 Other long term (current) drug therapy: Secondary | ICD-10-CM | POA: Diagnosis not present

## 2014-01-18 DIAGNOSIS — Z008 Encounter for other general examination: Secondary | ICD-10-CM | POA: Diagnosis present

## 2014-01-18 DIAGNOSIS — Z8719 Personal history of other diseases of the digestive system: Secondary | ICD-10-CM | POA: Insufficient documentation

## 2014-01-18 DIAGNOSIS — F3130 Bipolar disorder, current episode depressed, mild or moderate severity, unspecified: Secondary | ICD-10-CM

## 2014-01-18 DIAGNOSIS — J449 Chronic obstructive pulmonary disease, unspecified: Secondary | ICD-10-CM | POA: Diagnosis not present

## 2014-01-18 LAB — RAPID URINE DRUG SCREEN, HOSP PERFORMED
Amphetamines: NOT DETECTED
Barbiturates: NOT DETECTED
Benzodiazepines: NOT DETECTED
COCAINE: NOT DETECTED
OPIATES: NOT DETECTED
TETRAHYDROCANNABINOL: NOT DETECTED

## 2014-01-18 LAB — COMPREHENSIVE METABOLIC PANEL
ALBUMIN: 3.8 g/dL (ref 3.5–5.2)
ALT: 23 U/L (ref 0–35)
AST: 31 U/L (ref 0–37)
Alkaline Phosphatase: 70 U/L (ref 39–117)
Anion gap: 5 (ref 5–15)
BUN: 21 mg/dL (ref 6–23)
CO2: 27 mmol/L (ref 19–32)
Calcium: 9.1 mg/dL (ref 8.4–10.5)
Chloride: 106 mEq/L (ref 96–112)
Creatinine, Ser: 0.83 mg/dL (ref 0.50–1.10)
GFR calc Af Amer: 85 mL/min — ABNORMAL LOW (ref >90)
GFR calc non Af Amer: 73 mL/min — ABNORMAL LOW (ref >90)
Glucose, Bld: 266 mg/dL — ABNORMAL HIGH (ref 70–99)
Potassium: 4.2 mmol/L (ref 3.5–5.1)
Sodium: 138 mmol/L (ref 135–145)
Total Bilirubin: 0.6 mg/dL (ref 0.3–1.2)
Total Protein: 6.8 g/dL (ref 6.0–8.3)

## 2014-01-18 LAB — CBC WITH DIFFERENTIAL/PLATELET
BASOS PCT: 0 % (ref 0–1)
Basophils Absolute: 0 10*3/uL (ref 0.0–0.1)
Eosinophils Absolute: 0 10*3/uL (ref 0.0–0.7)
Eosinophils Relative: 0 % (ref 0–5)
HCT: 41.8 % (ref 36.0–46.0)
HEMOGLOBIN: 13.3 g/dL (ref 12.0–15.0)
LYMPHS ABS: 2.2 10*3/uL (ref 0.7–4.0)
Lymphocytes Relative: 23 % (ref 12–46)
MCH: 26.1 pg (ref 26.0–34.0)
MCHC: 31.8 g/dL (ref 30.0–36.0)
MCV: 82.1 fL (ref 78.0–100.0)
Monocytes Absolute: 0.7 10*3/uL (ref 0.1–1.0)
Monocytes Relative: 7 % (ref 3–12)
NEUTROS ABS: 6.8 10*3/uL (ref 1.7–7.7)
Neutrophils Relative %: 70 % (ref 43–77)
Platelets: 159 10*3/uL (ref 150–400)
RBC: 5.09 MIL/uL (ref 3.87–5.11)
RDW: 13.4 % (ref 11.5–15.5)
WBC: 9.7 10*3/uL (ref 4.0–10.5)

## 2014-01-18 LAB — URINALYSIS, ROUTINE W REFLEX MICROSCOPIC
Bilirubin Urine: NEGATIVE
Hgb urine dipstick: NEGATIVE
KETONES UR: NEGATIVE mg/dL
LEUKOCYTES UA: NEGATIVE
Nitrite: NEGATIVE
PH: 5.5 (ref 5.0–8.0)
Protein, ur: NEGATIVE mg/dL
Specific Gravity, Urine: 1.026 (ref 1.005–1.030)
UROBILINOGEN UA: 0.2 mg/dL (ref 0.0–1.0)

## 2014-01-18 LAB — CBG MONITORING, ED
GLUCOSE-CAPILLARY: 338 mg/dL — AB (ref 70–99)
Glucose-Capillary: 274 mg/dL — ABNORMAL HIGH (ref 70–99)
Glucose-Capillary: 266 mg/dL — ABNORMAL HIGH (ref 70–99)

## 2014-01-18 LAB — URINE MICROSCOPIC-ADD ON

## 2014-01-18 LAB — LITHIUM LEVEL

## 2014-01-18 LAB — ETHANOL: Alcohol, Ethyl (B): <5 mg/dL (ref 0–9)

## 2014-01-18 MED ORDER — RISPERIDONE 1 MG PO TABS
1.0000 mg | ORAL_TABLET | Freq: Every day | ORAL | Status: DC
  Administered 2014-01-18 – 2014-01-19 (×2): 1 mg via ORAL
  Filled 2014-01-18 (×2): qty 1

## 2014-01-18 MED ORDER — SODIUM CHLORIDE 0.9 % IV BOLUS (SEPSIS)
1000.0000 mL | Freq: Once | INTRAVENOUS | Status: AC
  Administered 2014-01-18: 1000 mL via INTRAVENOUS

## 2014-01-18 MED ORDER — VITAMIN D (ERGOCALCIFEROL) 1.25 MG (50000 UNIT) PO CAPS
50000.0000 [IU] | ORAL_CAPSULE | ORAL | Status: DC
  Administered 2014-01-18: 50000 [IU] via ORAL
  Filled 2014-01-18: qty 1

## 2014-01-18 MED ORDER — TIOTROPIUM BROMIDE MONOHYDRATE 18 MCG IN CAPS
18.0000 ug | ORAL_CAPSULE | Freq: Every day | RESPIRATORY_TRACT | Status: DC | PRN
  Filled 2014-01-18: qty 5

## 2014-01-18 MED ORDER — INSULIN GLARGINE 100 UNIT/ML ~~LOC~~ SOLN
10.0000 [IU] | Freq: Every day | SUBCUTANEOUS | Status: DC
  Administered 2014-01-19: 10 [IU] via SUBCUTANEOUS
  Filled 2014-01-18 (×2): qty 0.1

## 2014-01-18 MED ORDER — INSULIN LISPRO 100 UNIT/ML ~~LOC~~ SOLN
10.0000 [IU] | Freq: Two times a day (BID) | SUBCUTANEOUS | Status: DC
  Filled 2014-01-18: qty 10

## 2014-01-18 MED ORDER — INSULIN ASPART 100 UNIT/ML ~~LOC~~ SOLN
10.0000 [IU] | Freq: Two times a day (BID) | SUBCUTANEOUS | Status: DC
  Administered 2014-01-18 – 2014-01-19 (×3): 10 [IU] via SUBCUTANEOUS
  Administered 2014-01-19: 5 [IU] via SUBCUTANEOUS
  Filled 2014-01-18 (×2): qty 1

## 2014-01-18 MED ORDER — INSULIN GLARGINE 100 UNIT/ML ~~LOC~~ SOLN
20.0000 [IU] | Freq: Once | SUBCUTANEOUS | Status: AC
  Administered 2014-01-18: 20 [IU] via SUBCUTANEOUS
  Filled 2014-01-18: qty 0.2

## 2014-01-18 MED ORDER — INSULIN GLARGINE 100 UNIT/ML ~~LOC~~ SOLN
10.0000 [IU] | Freq: Every day | SUBCUTANEOUS | Status: DC
  Filled 2014-01-18: qty 0.1

## 2014-01-18 MED ORDER — LITHIUM CARBONATE ER 450 MG PO TBCR
450.0000 mg | EXTENDED_RELEASE_TABLET | Freq: Two times a day (BID) | ORAL | Status: DC
  Administered 2014-01-18 – 2014-01-19 (×4): 450 mg via ORAL
  Filled 2014-01-18 (×7): qty 1

## 2014-01-18 MED ORDER — ATORVASTATIN CALCIUM 20 MG PO TABS
20.0000 mg | ORAL_TABLET | Freq: Every morning | ORAL | Status: DC
  Administered 2014-01-18 – 2014-01-19 (×2): 20 mg via ORAL
  Filled 2014-01-18 (×4): qty 1

## 2014-01-18 MED ORDER — INSULIN GLARGINE 100 UNIT/ML ~~LOC~~ SOLN
30.0000 [IU] | Freq: Every day | SUBCUTANEOUS | Status: DC
  Administered 2014-01-18 – 2014-01-19 (×2): 30 [IU] via SUBCUTANEOUS
  Filled 2014-01-18 (×3): qty 0.3

## 2014-01-18 NOTE — ED Provider Notes (Signed)
CSN: [id number]     Arrival date & time 01/18/14  0454 History   First MD Initiated Contact with Patient 01/18/14 [phone]     Chief Complaint  Patient presents with  . Medical Clearance     (Consider location/radiation/quality/duration/timing/severity/associated sxs/prior Treatment) HPI   Kathryn Mcguire is a 64 y.o. female who is here for evaluation of high blood sugar, related to not taking her medications.  Her son brought her in.  He is also concerned that she has not been staying at her home, because she has chosen not to.  She left a note stating that she did not want to live in the house anymore.  She is not currently seeing a psychiatrist or therapist.  She takes her medications sporadically.  The patient is evasive and will not tell me anything further.  Level V Caveat- poor historian   Past Medical History  Diagnosis Date  . Diabetes mellitus without complication   . COPD (chronic obstructive pulmonary disease)   . Arthritis   . Bipolar depression   . Elevated LFTs   . Hypertension   . Hyperlipemia   . UTI (lower urinary tract infection)   . Emphysema of lung   . GERD (gastroesophageal reflux disease)   . History of adenomatous polyps of colon 10/15/2013   Past Surgical History  Procedure Laterality Date  . Tonsillectomy    . Tubal ligation    . Tonsillectomy and adenoidectomy     Family History  Problem Relation Age of Onset  . Colon cancer Neg Hx   . Stomach cancer Neg Hx   . Esophageal cancer Neg Hx   . Rectal cancer Neg Hx    History  Substance Use Topics  . Smoking status: Current Some Day Smoker -- 0.25 packs/day  . Smokeless tobacco: Current User    Types: Chew  . Alcohol Use: Yes     Comment: occasional   OB History    No data available     Review of Systems  All other systems reviewed and are negative.     Allergies  Metformin and related  Home Medications   Prior to Admission medications   Medication Sig Start Date End Date  Taking? Authorizing Provider  insulin glargine (LANTUS) 100 UNIT/ML injection Inject 10-30 Units into the skin 2 (two) times daily. 10 units every morning; 30 units at bedtime   Yes Historical Provider, MD  insulin lispro (HUMALOG) 100 UNIT/ML injection Inject 10 Units into the skin 2 (two) times daily. 10 units before breakfast and before dinner. Sliding scale if blood sugar over 200.   Yes Historical Provider, MD  tiotropium (SPIRIVA) 18 MCG inhalation capsule Place 18 mcg into inhaler and inhale daily as needed (for shortness of breath).   Yes Historical Provider, MD  Vitamin D, Ergocalciferol, (DRISDOL) 50000 UNITS CAPS capsule Take 50,000 Units by mouth every 7 (seven) days. Takes on Monday's   Yes Historical Provider, MD  atorvastatin (LIPITOR) 20 MG tablet Take 20 mg by mouth every morning.     Historical Provider, MD  lithium carbonate (ESKALITH) 450 MG CR tablet Take 450 mg by mouth 2 (two) times daily.    Historical Provider, MD  risperiDONE (RISPERDAL) 1 MG tablet Take 1 mg by mouth at bedtime.    Historical Provider, MD   BP 121/101 mmHg  Pulse 88  Temp(Src) 98.3 F (36.8 C) (Oral)  Resp 16  Ht 5\' 3"  (1.6 m)  Wt 140 lb (63.504 kg)  BMI  24.81 kg/m2  SpO2 100% Physical Exam  Constitutional: She is oriented to person, place, and time. She appears well-developed and well-nourished.  HENT:  Head: Normocephalic and atraumatic.  Right Ear: External ear normal.  Left Ear: External ear normal.  Eyes: Conjunctivae and EOM are normal. Pupils are equal, round, and reactive to light.  Neck: Normal range of motion and phonation normal. Neck supple.  Cardiovascular: Normal rate, regular rhythm and normal heart sounds.   Pulmonary/Chest: Effort normal and breath sounds normal. She exhibits no bony tenderness.  Abdominal: Soft. There is no tenderness.  Musculoskeletal: Normal range of motion.  Neurological: She is alert and oriented to person, place, and time. No cranial nerve deficit or  sensory deficit. She exhibits normal muscle tone. Coordination normal.  Skin: Skin is warm, dry and intact.  Psychiatric: Her behavior is normal. Judgment and thought content normal.  Poor eye contact  Nursing note and vitals reviewed.   ED Course  Procedures (including critical care time)  Medications  atorvastatin (LIPITOR) tablet 20 mg (20 mg Oral Given 01/18/14 0950)  insulin glargine (LANTUS) injection 30 Units (not administered)  lithium carbonate (ESKALITH) CR tablet 450 mg (450 mg Oral Given 01/18/14 0950)  risperiDONE (RISPERDAL) tablet 1 mg (not administered)  tiotropium (SPIRIVA) inhalation capsule 18 mcg (not administered)  Vitamin D (Ergocalciferol) (DRISDOL) capsule 50,000 Units (50,000 Units Oral Given 01/18/14 0950)  insulin aspart (novoLOG) injection 10 Units (10 Units Subcutaneous Given 01/18/14 0947)  insulin glargine (LANTUS) injection 10 Units (not administered)  sodium chloride 0.9 % bolus 1,000 mL (0 mLs Intravenous Stopped 01/18/14 1056)  insulin glargine (LANTUS) injection 20 Units (20 Units Subcutaneous Given 01/18/14 0948)    Patient Vitals for the past 24 hrs:  BP Temp Temp src Pulse Resp SpO2 Height Weight  01/18/14 1422 (!) 121/101 mmHg 98.3 F (36.8 C) Oral 88 16 100 % - -  01/18/14 1111 139/69 mmHg 98.2 F (36.8 C) Oral 80 16 100 % - -  01/18/14 0900 163/89 mmHg - - 95 - 98 % - -  01/18/14 0534 129/72 mmHg 98.3 F (36.8 C) Oral 96 16 98 % 5\' 3"  (1.6 m) 140 lb (63.504 kg)   1400- she is medically cleared for admission and treatment by psychiatry service, either in or outpatient  TTS Consultation for assistance with disposition   Labs Review Labs Reviewed  COMPREHENSIVE METABOLIC PANEL - Abnormal; Notable for the following:    Glucose, Bld 266 (*)    GFR calc non Af Amer 73 (*)    GFR calc Af Amer 85 (*)    All other components within normal limits  URINALYSIS, ROUTINE W REFLEX MICROSCOPIC - Abnormal; Notable for the following:    Glucose, UA  >1000 (*)    All other components within normal limits  LITHIUM LEVEL - Abnormal; Notable for the following:    Lithium Lvl <0.06 (*)    All other components within normal limits  CBG MONITORING, ED - Abnormal; Notable for the following:    Glucose-Capillary 338 (*)    All other components within normal limits  CBG MONITORING, ED - Abnormal; Notable for the following:    Glucose-Capillary 274 (*)    All other components within normal limits  URINE CULTURE  CBC WITH DIFFERENTIAL  URINE RAPID DRUG SCREEN (HOSP PERFORMED)  ETHANOL  URINE MICROSCOPIC-ADD ON    Imaging Review No results found.   EKG Interpretation None      MDM   Final diagnoses:  Psychiatric  illness  Hyperglycemia  Noncompliance with medication regimen    Nursing Notes Reviewed/ Care Coordinated, and agree without changes. Applicable Imaging Reviewed.  Interpretation of Laboratory Data incorporated into ED treatment  Plan- as per TTS in conjunction with oncoming provider team    Richarda Blade, MD 01/18/14 1734

## 2014-01-18 NOTE — ED Notes (Signed)
Pt has 1 bag of belongings in locker 31

## 2014-01-18 NOTE — Consult Note (Signed)
[facility] Face-to-Face Psychiatry Consult   Reason for Consult:  Bipolar depressed, Medication non compliance Referring Physician:  EDP Kathryn Mcguire is an 64 y.o. female. Total Time spent with patient: 1 hour  Assessme: DSM5 BIPOLAR DISORDER, DEPRESSED    Past Medical History  Diagnosis Date  . Diabetes mellitus without complication   . COPD (chronic obstructive pulmonary disease)   . Arthritis   . Bipolar depression   . Elevated LFTs   . Hypertension   . Hyperlipemia   . UTI (lower urinary tract infection)   . Emphysema of lung   . GERD (gastroesophageal reflux disease)   . History of adenomatous polyps of colon 10/15/2013    Plan:  Recommend psychiatric Inpatient admission when medically cleared.  Subjective:   Kathryn Mcguire is a 64 y.o. female patient admitted with Bipolar disorder, Medication non compliance  HPI:  [name], 64 years old was brought in by his son for evaluation of her high blood glucose and wandering.  Son was at the bed side during this assessment.  Patient's son stated that his mother left the house one evening and was not found till the next day.  She has not been caring for herself or taking her medications.  Patient stated that she was out of her medications and was not able to refill her medications due to lack of fund, insurance and inability to wait at [facility] to be seen for free or cheap medicines.  Her son stated that her wandering off the house is new behavior for patient who could not say why she left the house.  Patient stated that when she is on her medications they are effective.  She denied SI/HI/AVH.  She reported poor sleep and appetite.  She has been accepted for admission at a [facility]psych[facility] unit.  We have restarted her medications pending her admission.  HPI Elements:   Location:  Bipolar disorder. Quality:  Medication non compliance. Severity:  severe, wandered off her house. Duration:  Chronic mental illness. Context:  Brought in  by son for medication management.  Past Psychiatric History: Past Medical History  Diagnosis Date  . Diabetes mellitus without complication   . COPD (chronic obstructive pulmonary disease)   . Arthritis   . Bipolar depression   . Elevated LFTs   . Hypertension   . Hyperlipemia   . UTI (lower urinary tract infection)   . Emphysema of lung   . GERD (gastroesophageal reflux disease)   . History of adenomatous polyps of colon 10/15/2013    reports that she has been smoking.  Her smokeless tobacco use includes Chew. She reports that she drinks alcohol. She reports that she does not use illicit drugs. Family History  Problem Relation Age of Onset  . Colon cancer Neg Hx   . Stomach cancer Neg Hx   . Esophageal cancer Neg Hx   . Rectal cancer Neg Hx    Family History Substance Abuse: No Family Supports: Yes, List: (children) Living Arrangements: Other (Comment) Can pt return to current living arrangement?: Yes Abuse/Neglect [facility]) Physical Abuse: Denies Verbal Abuse: Denies Sexual Abuse: Denies Allergies:   Allergies  Allergen Reactions  . Metformin And Related Nausea And Vomiting    ACT Assessment Complete:  Yes:    Educational Status    Risk to Self: Risk to self with the past 6 months Suicidal Ideation: No Suicidal Intent: No Is patient at risk for suicide?: No Suicidal Plan?: No Access to Means: No What has been your  use of drugs/alcohol within the last 12 months?: NA Previous Attempts/Gestures: Yes How many times?: 3 Other Self Harm Risks: None Triggers for Past Attempts: None known Intentional Self Injurious Behavior: None Family Suicide History: Unknown Recent stressful life event(s): Other (Comment) (None) Persecutory voices/beliefs?: No Depression: Yes Depression Symptoms: Loss of interest in usual pleasures, Feeling worthless/self pity, Feeling angry/irritable Substance abuse history and/or treatment for substance abuse?: No Suicide prevention information  given to non-admitted patients: Not applicable  Risk to Others: Risk to Others within the past 6 months Homicidal Ideation: No Thoughts of Harm to Others: No Current Homicidal Intent: No Current Homicidal Plan: No Access to Homicidal Means: No Identified Victim: None History of harm to others?: No Assessment of Violence: None Noted Violent Behavior Description: None Does patient have access to weapons?: No Criminal Charges Pending?: No Does patient have a court date: No  Abuse: Abuse/Neglect Assessment (Assessment to be complete while patient is alone) Physical Abuse: Denies Verbal Abuse: Denies Sexual Abuse: Denies Exploitation of patient/patient's resources: Denies Self-Neglect: Denies  Prior Inpatient Therapy: Prior Inpatient Therapy Prior Inpatient Therapy: Yes Prior Therapy Dates: Unknown Prior Therapy Facilty/Provider(s): [place] Reason for Treatment: Bipolar  Prior Outpatient Therapy: Prior Outpatient Therapy Prior Outpatient Therapy: Yes Prior Therapy Dates: NA Prior Therapy Facilty/Provider(s): [facility] Reason for Treatment: Bipolar  Additional Information: Additional Information 1:1 In Past 12 Months?: No CIRT Risk: No Elopement Risk: No Does patient have medical clearance?: Yes    Objective: Blood pressure 149/75, pulse 81, temperature 98.3 F (36.8 C), temperature source Oral, resp. rate 16, height _0  (1.6 m), weight 63.504 kg (140 lb), SpO2 98 %.Body mass index is 24.81 kg/(m^2). Results for orders placed or performed during the hospital encounter of 01/18/14 (from the past 72 hour(s))  POC CBG, ED     Status: Abnormal   Collection Time: 01/18/14  6:01 AM  Result Value Ref Range   Glucose-Capillary 338 (H) 70 - 99 mg/dL  Comprehensive metabolic panel     Status: Abnormal   Collection Time: 01/18/14  7:44 AM  Result Value Ref Range   Sodium 138 135 - 145 mmol/L    Comment: Please note change in reference range.   Potassium 4.2 3.5 - 5.1 mmol/L     Comment: Please note change in reference range.   Chloride 106 96 - 112 mEq/L   CO2 27 19 - 32 mmol/L   Glucose, Bld 266 (H) 70 - 99 mg/dL   BUN 21 6 - 23 mg/dL   Creatinine, Ser 0.83 0.50 - 1.10 mg/dL   Calcium 9.1 8.4 - 10.5 mg/dL   Total Protein 6.8 6.0 - 8.3 g/dL   Albumin 3.8 3.5 - 5.2 g/dL   AST 31 0 - 37 U/L   ALT 23 0 - 35 U/L   Alkaline Phosphatase 70 39 - 117 U/L   Total Bilirubin 0.6 0.3 - 1.2 mg/dL   GFR calc non Af Amer 73 (L) >90 mL/min   GFR calc Af Amer 85 (L) >90 mL/min    Comment: (NOTE) The eGFR has been calculated using the CKD EPI equation. This calculation has not been validated in all clinical situations. eGFR's persistently <90 mL/min signify possible Chronic Kidney Disease.    Anion gap 5 5 - 15  CBC with Differential     Status: None   Collection Time: 01/18/14  7:44 AM  Result Value Ref Range   WBC 9.7 4.0 - 10.5 K/uL   RBC 5.09 3.87 - 5.11 MIL/uL  Hemoglobin 13.3 12.0 - 15.0 g/dL   HCT 41.8 36.0 - 46.0 %   MCV 82.1 78.0 - 100.0 fL   MCH 26.1 26.0 - 34.0 pg   MCHC 31.8 30.0 - 36.0 g/dL   RDW 13.4 11.5 - 15.5 %   Platelets 159 150 - 400 K/uL   Neutrophils Relative % 70 43 - 77 %   Neutro Abs 6.8 1.7 - 7.7 K/uL   Lymphocytes Relative 23 12 - 46 %   Lymphs Abs 2.2 0.7 - 4.0 K/uL   Monocytes Relative 7 3 - 12 %   Monocytes Absolute 0.7 0.1 - 1.0 K/uL   Eosinophils Relative 0 0 - 5 %   Eosinophils Absolute 0.0 0.0 - 0.7 K/uL   Basophils Relative 0 0 - 1 %   Basophils Absolute 0.0 0.0 - 0.1 K/uL  Ethanol     Status: None   Collection Time: 01/18/14  7:44 AM  Result Value Ref Range   Alcohol, Ethyl (B) <5 0 - 9 mg/dL    Comment:        LOWEST DETECTABLE LIMIT FOR SERUM ALCOHOL IS 11 mg/dL FOR MEDICAL PURPOSES ONLY   Lithium level     Status: Abnormal   Collection Time: 01/18/14  8:00 AM  Result Value Ref Range   Lithium Lvl <0.06 (L) 0.80 - 1.40 mmol/L    Comment: REPEATED TO VERIFY  Urinalysis, Routine w reflex microscopic     Status:  Abnormal   Collection Time: 01/18/14  8:04 AM  Result Value Ref Range   Color, Urine YELLOW YELLOW   APPearance CLEAR CLEAR   Specific Gravity, Urine 1.026 1.005 - 1.030   pH 5.5 5.0 - 8.0   Glucose, UA >1000 (A) NEGATIVE mg/dL   Hgb urine dipstick NEGATIVE NEGATIVE   Bilirubin Urine NEGATIVE NEGATIVE   Ketones, ur NEGATIVE NEGATIVE mg/dL   Protein, ur NEGATIVE NEGATIVE mg/dL   Urobilinogen, UA 0.2 0.0 - 1.0 mg/dL   Nitrite NEGATIVE NEGATIVE   Leukocytes, UA NEGATIVE NEGATIVE  Urine rapid drug screen (hosp performed)     Status: None   Collection Time: 01/18/14  8:04 AM  Result Value Ref Range   Opiates NONE DETECTED NONE DETECTED   Cocaine NONE DETECTED NONE DETECTED   Benzodiazepines NONE DETECTED NONE DETECTED   Amphetamines NONE DETECTED NONE DETECTED   Tetrahydrocannabinol NONE DETECTED NONE DETECTED   Barbiturates NONE DETECTED NONE DETECTED    Comment:        DRUG SCREEN FOR MEDICAL PURPOSES ONLY.  IF CONFIRMATION IS NEEDED FOR ANY PURPOSE, NOTIFY LAB WITHIN 5 DAYS.        LOWEST DETECTABLE LIMITS FOR URINE DRUG SCREEN Drug Class       Cutoff (ng/mL) Amphetamine      1000 Barbiturate      200 Benzodiazepine   259 Tricyclics       563 Opiates          300 Cocaine          300 THC              50   Urine microscopic-add on     Status: None   Collection Time: 01/18/14  8:04 AM  Result Value Ref Range   Squamous Epithelial / LPF RARE RARE   WBC, UA 3-6 <3 WBC/hpf   RBC / HPF 0-2 <3 RBC/hpf   Bacteria, UA RARE RARE  CBG monitoring, ED     Status: Abnormal   Collection Time:  01/18/14  9:36 AM  Result Value Ref Range   Glucose-Capillary 274 (H) 70 - 99 mg/dL   Labs are reviewed and are pertinent for Unremarkable.  Current Facility-Administered Medications  Medication Dose Route Frequency Provider Last Rate Last Dose  . atorvastatin (LIPITOR) tablet 20 mg  20 mg Oral q morning - 10a Richarda Blade, MD   20 mg at 01/18/14 0950  . insulin aspart (novoLOG)  injection 10 Units  10 Units Subcutaneous BID Richarda Blade, MD   10 Units at 01/18/14 [id number]  . [START ON 01/19/2014] insulin glargine (LANTUS) injection 10 Units  10 Units Subcutaneous Daily Richarda Blade, MD      . insulin glargine (LANTUS) injection 30 Units  30 Units Subcutaneous QHS Richarda Blade, MD      . lithium carbonate (ESKALITH) CR tablet 450 mg  450 mg Oral BID Richarda Blade, MD   450 mg at 01/18/14 0950  . risperiDONE (RISPERDAL) tablet 1 mg  1 mg Oral QHS Richarda Blade, MD      . tiotropium [facility]) inhalation capsule 18 mcg  18 mcg Inhalation Daily PRN Richarda Blade, MD      . Vitamin D (Ergocalciferol) (DRISDOL) capsule 50,000 Units  50,000 Units Oral Q7 days Richarda Blade, MD   50,000 Units at 01/18/14 9604   Current Outpatient Prescriptions  Medication Sig Dispense Refill  . insulin glargine (LANTUS) 100 UNIT/ML injection Inject 10-30 Units into the skin 2 (two) times daily. 10 units every morning; 30 units at bedtime    . insulin lispro (HUMALOG) 100 UNIT/ML injection Inject 10 Units into the skin 2 (two) times daily. 10 units before breakfast and before dinner. Sliding scale if blood sugar over 200.    . tiotropium (SPIRIVA) 18 MCG inhalation capsule Place 18 mcg into inhaler and inhale daily as needed (for shortness of breath).    . Vitamin D, Ergocalciferol, (DRISDOL) 50000 UNITS CAPS capsule Take 50,000 Units by mouth every 7 (seven) days. Takes on Monday's    . atorvastatin (LIPITOR) 20 MG tablet Take 20 mg by mouth every morning.     . lithium carbonate (ESKALITH) 450 MG CR tablet Take 450 mg by mouth 2 (two) times daily.    . risperiDONE (RISPERDAL) 1 MG tablet Take 1 mg by mouth at bedtime.      Psychiatric Specialty Exam:     Blood pressure 149/75, pulse 81, temperature 98.3 F (36.8 C), temperature source Oral, resp. rate 16, height _0  (1.6 m), weight 63.504 kg (140 lb), SpO2 98 %.Body mass index is 24.81 kg/(m^2).  General Appearance: Casual   Eye Contact::  Poor  Speech:  Clear and Coherent and Slow  Volume:  Normal  Mood:  Depressed and Dysphoric  Affect:  Congruent, Depressed and Flat  Thought Process:  Coherent, Goal Directed and Intact  Orientation:  Full (Time, Place, and Person)  Thought Content:  WDL  Suicidal Thoughts:  No  Homicidal Thoughts:  No  Memory:  Immediate;   Good Recent;   Good Remote;   Good  Judgement:  Poor  Insight:  Shallow  Psychomotor Activity:  Normal  Concentration:  Good  Recall:  NA  Fund of Knowledge:Fair  Language: Fair  Akathisia:  NA  Handed:  Right  AIMS (if indicated):     Assets:  Desire for Improvement  Sleep:      Musculoskeletal: Strength & Muscle Tone: within normal limits Gait & Station: normal Patient leans:  N/A  Treatment Plan Summary: Daily contact with patient to assess and evaluate symptoms and progress in treatment Medication management  Delfin Gant 01/18/2014 7:26 PM  Patient seen, evaluated and I agree with notes by Nurse Practitioner. Corena Pilgrim, MD

## 2014-01-18 NOTE — ED Notes (Signed)
Pt has been given medical clearance scrubs to change into, pt will be transferred to TCU after she changes

## 2014-01-18 NOTE — ED Notes (Signed)
Pt arrived to the ED with a complaint of blood sugar problems.  Pt states she has not been taking her diabetic or her psychiatric medications for some time.  Pt's son states his mother took off Saturday evening and did not return leaving a note that she didn't wish to live in the house anymore. Pt states she doesn't know why she doesn't take her medications.  Pt's son is conscerned for both medical and psychological issues

## 2014-01-18 NOTE — ED Notes (Signed)
TTS in the room with the patient.

## 2014-01-18 NOTE — BH Assessment (Signed)
Assessment Note  Kathryn Mcguire is an 64 y.o. female.   Axis I: Bipolar, Depressed Axis II: Deferred Axis III:  Past Medical History  Diagnosis Date  . Diabetes mellitus without complication   . COPD (chronic obstructive pulmonary disease)   . Arthritis   . Bipolar depression   . Elevated LFTs   . Hypertension   . Hyperlipemia   . UTI (lower urinary tract infection)   . Emphysema of lung   . GERD (gastroesophageal reflux disease)   . History of adenomatous polyps of colon 10/15/2013   Axis IV: other psychosocial or environmental problems, problems related to social environment and problems with access to health care services Axis V: 31-40 impairment in reality testing  Past Medical History:  Past Medical History  Diagnosis Date  . Diabetes mellitus without complication   . COPD (chronic obstructive pulmonary disease)   . Arthritis   . Bipolar depression   . Elevated LFTs   . Hypertension   . Hyperlipemia   . UTI (lower urinary tract infection)   . Emphysema of lung   . GERD (gastroesophageal reflux disease)   . History of adenomatous polyps of colon 10/15/2013    Past Surgical History  Procedure Laterality Date  . Tonsillectomy    . Tubal ligation    . Tonsillectomy and adenoidectomy      Family History:  Family History  Problem Relation Age of Onset  . Colon cancer Neg Hx   . Stomach cancer Neg Hx   . Esophageal cancer Neg Hx   . Rectal cancer Neg Hx     Social History:  reports that she has been smoking.  Her smokeless tobacco use includes Chew. She reports that she drinks alcohol. She reports that she does not use illicit drugs.  Additional Social History:  Alcohol / Drug Use Pain Medications: Pt denies Prescriptions: Pt denies Over the Counter: Pt denies History of alcohol / drug use?: No history of alcohol / drug abuse Longest period of sobriety (when/how long): Unknown Withdrawal Symptoms: Nausea / Vomiting  CIWA: CIWA-Ar BP: 163/89  mmHg Pulse Rate: 95 COWS:    Allergies:  Allergies  Allergen Reactions  . Metformin And Related Nausea And Vomiting    Home Medications:  (Not in a hospital admission)  OB/GYN Status:  No LMP recorded. Patient is postmenopausal.  General Assessment Data Location of Assessment: [facility] ACT Assessment: Yes Is this a Tele or Face-to-Face Assessment?: Tele Assessment Is this an Initial Assessment or a Re-assessment for this encounter?: Initial Assessment Living Arrangements: Other (Comment) Can pt return to current living arrangement?: Yes Admission Status: Voluntary Is patient capable of signing voluntary admission?: Yes Transfer from: Home Referral Source: Self/Family/Friend     [place] Living Arrangements: Other (Comment) Name of Psychiatrist: None Name of Therapist: [facility]  Education Status Is patient currently in school?: No Current Grade: NA Highest grade of school patient has completed: NA Name of school: NA Contact person: NA  Risk to self with the past 6 months Suicidal Ideation: No Suicidal Intent: No Is patient at risk for suicide?: No Suicidal Plan?: No Access to Means: No What has been your use of drugs/alcohol within the last 12 months?: NA Previous Attempts/Gestures: Yes How many times?: 3 Other Self Harm Risks: None Triggers for Past Attempts: None known Intentional Self Injurious Behavior: None Family Suicide History: Unknown Recent stressful life event(s): Other (Comment) (None) Persecutory voices/beliefs?: No Depression: Yes Depression Symptoms: Loss of interest in  usual pleasures, Feeling worthless/self pity, Feeling angry/irritable Substance abuse history and/or treatment for substance abuse?: No Suicide prevention information given to non-admitted patients: Not applicable  Risk to Others within the past 6 months Homicidal Ideation: No Thoughts of Harm to Others: No Current Homicidal Intent: No Current  Homicidal Plan: No Access to Homicidal Means: No Identified Victim: None History of harm to others?: No Assessment of Violence: None Noted Violent Behavior Description: None Does patient have access to weapons?: No Criminal Charges Pending?: No Does patient have a court date: No  Psychosis Hallucinations: None noted Delusions: None noted  Mental Status Report Appear/Hygiene: Unremarkable Eye Contact: Poor Motor Activity: Freedom of movement Speech: Logical/coherent Level of Consciousness: Alert Mood: Depressed Affect: Depressed Anxiety Level: Minimal Thought Processes: Coherent, Relevant Judgement: Impaired Orientation: Person, Place, Time, Situation Obsessive Compulsive Thoughts/Behaviors: Minimal  Cognitive Functioning Concentration: Good Memory: Recent Intact, Remote Intact IQ: Average Insight: Fair Impulse Control: Fair Appetite: Fair Weight Loss: 0 Weight Gain: 0 Sleep: No Change Total Hours of Sleep: 5 Vegetative Symptoms: None  ADLScreening [facility] Assessment Services) Patient's cognitive ability adequate to safely complete daily activities?: Yes Patient able to express need for assistance with ADLs?: Yes Independently performs ADLs?: Yes (appropriate for developmental age)  Prior Inpatient Therapy Prior Inpatient Therapy: Yes Prior Therapy Dates: Unknown Prior Therapy Facilty/Provider(s): [facility] Reason for Treatment: Bipolar  Prior Outpatient Therapy Prior Outpatient Therapy: Yes Prior Therapy Dates: NA Prior Therapy Facilty/Provider(s): [facility] Reason for Treatment: Bipolar  ADL Screening (condition at time of admission) Patient's cognitive ability adequate to safely complete daily activities?: Yes Is the patient deaf or have difficulty hearing?: No Does the patient have difficulty seeing, even when wearing glasses/contacts?: No Does the patient have difficulty concentrating, remembering, or making decisions?: Yes Patient able to express need  for assistance with ADLs?: Yes Does the patient have difficulty dressing or bathing?: No Independently performs ADLs?: Yes (appropriate for developmental age) Does the patient have difficulty walking or climbing stairs?: No Weakness of Legs: None Weakness of Arms/Hands: None  Home Assistive Devices/Equipment Home Assistive Devices/Equipment: None    Abuse/Neglect Assessment (Assessment to be complete while patient is alone) Physical Abuse: Denies Verbal Abuse: Denies Sexual Abuse: Denies Exploitation of patient/patient's resources: Denies Self-Neglect: Denies Values / Beliefs Cultural Requests During Hospitalization: None Spiritual Requests During Hospitalization: None Consults Spiritual Care Consult Needed: No Social Work Consult Needed: No Regulatory affairs officer (For Healthcare) Does patient have an advance directive?: No Would patient like information on creating an advanced directive?: No - patient declined information    Additional Information 1:1 In Past 12 Months?: No CIRT Risk: No Elopement Risk: No Does patient have medical clearance?: Yes     Disposition:  Disposition Initial Assessment Completed for this Encounter: Yes  On Site Evaluation by:   Reviewed with Physician:    Lorenza Cambridge D 01/18/2014 11:12 AM

## 2014-01-18 NOTE — ED Notes (Signed)
Upon entrance to room IV was on floor. Pt stated she removed it because it was irritating her, pt also asked, " what else are you going to do I want to leave" in irritated manor.  I told pt I was not aware of what the doctor's plan is, but I would inform her nurse.  I also told pt that in the future whenever in a hospital, she needs to inform staff that her IV is irritating her not simple remove it.

## 2014-01-19 ENCOUNTER — Emergency Department (HOSPITAL_COMMUNITY): Payer: Medicare Other

## 2014-01-19 LAB — CBG MONITORING, ED
GLUCOSE-CAPILLARY: 117 mg/dL — AB (ref 70–99)
GLUCOSE-CAPILLARY: 141 mg/dL — AB (ref 70–99)
Glucose-Capillary: 372 mg/dL — ABNORMAL HIGH (ref 70–99)

## 2014-01-19 LAB — URINE CULTURE: SPECIAL REQUESTS: NORMAL

## 2014-01-19 NOTE — ED Notes (Signed)
Pt A/Ox3, sts shes ready to go, she lives with her son but sts he wont let her come back so she has no where to go, denies physical pain but sts she has emotional pain; sts that shes a bad person and needs to go to jail, but wont eloborate on that comment

## 2014-01-19 NOTE — Progress Notes (Signed)
Per Shirlee Limerick from [place] patient would need to be involuntary and they will give her a bed this evening.  An interpretation of the chest x-ray was sent over to [facility] at the new fax [phone].  Verlon Setting, [place] Disposition staff 01/19/2014 4:45 PM

## 2014-01-19 NOTE — Progress Notes (Signed)
CSW faxed patient IVC documentation to [place], [place] ED CSW 01/19/2014 9:37 PM

## 2014-01-19 NOTE — Consult Note (Signed)
[facility] Face-to-Face Psychiatry Consult   Reason for Consult:  Bipolar depressed, Medication non compliance  Referring Physician:  EDP Kathryn Mcguire is an 64 y.o. female. Total Time spent with patient: 25 minutes  Assessment: DSM5 BIPOLAR DISORDER, DEPRESSED    Past Medical History  Diagnosis Date  . Diabetes mellitus without complication   . COPD (chronic obstructive pulmonary disease)   . Arthritis   . Bipolar depression   . Elevated LFTs   . Hypertension   . Hyperlipemia   . UTI (lower urinary tract infection)   . Emphysema of lung   . GERD (gastroesophageal reflux disease)   . History of adenomatous polyps of colon 10/15/2013    Plan:  Recommend psychiatric Inpatient admission when medically cleared.  Subjective:   Kathryn Mcguire is a 64 y.o. female patient admitted with Bipolar disorder, Medication non compliance. Pt seen and evaluated by Dr. Darleene Cleaver and Waylan Boga, NP. Nursing notes demonstrate impulsivity, mood lability, and constant need for redirection. Pt continues to meet inpatient criteria.   HPI:  [name], 64 years old was brought in by his son for evaluation of her high blood glucose and wandering.  Son was at the bed side during this assessment.  Patient's son stated that his mother left the house one evening and was not found till the next day.  She has not been caring for herself or taking her medications.  Patient stated that she was out of her medications and was not able to refill her medications due to lack of fund, insurance and inability to wait at [facility] to be seen for free or cheap medicines.  Her son stated that her wandering off the house is new behavior for patient who could not say why she left the house.  Patient stated that when she is on her medications they are effective.  She denied SI/HI/AVH.  She reported poor sleep and appetite.  She has been accepted for admission at a Gero-psychiatric unit.  We have restarted her medications pending her  admission.  HPI Elements:   Location:  Bipolar disorder. Quality:  Medication non compliance. Severity:  severe, wandered off her house. Duration:  Chronic mental illness. Context:  Brought in by son for medication management.  Past Psychiatric History: Past Medical History  Diagnosis Date  . Diabetes mellitus without complication   . COPD (chronic obstructive pulmonary disease)   . Arthritis   . Bipolar depression   . Elevated LFTs   . Hypertension   . Hyperlipemia   . UTI (lower urinary tract infection)   . Emphysema of lung   . GERD (gastroesophageal reflux disease)   . History of adenomatous polyps of colon 10/15/2013    reports that she has been smoking.  Her smokeless tobacco use includes Chew. She reports that she drinks alcohol. She reports that she does not use illicit drugs. Family History  Problem Relation Age of Onset  . Colon cancer Neg Hx   . Stomach cancer Neg Hx   . Esophageal cancer Neg Hx   . Rectal cancer Neg Hx    Family History Substance Abuse: No Family Supports: Yes, List: (children) Living Arrangements: Other (Comment) Can pt return to current living arrangement?: Yes Abuse/Neglect [facility]) Physical Abuse: Denies Verbal Abuse: Denies Sexual Abuse: Denies Allergies:   Allergies  Allergen Reactions  . Metformin And Related Nausea And Vomiting    ACT Assessment Complete:  Yes:    Educational Status    Risk to Self: Risk to  self with the past 6 months Suicidal Ideation: No Suicidal Intent: No Is patient at risk for suicide?: No Suicidal Plan?: No Access to Means: No What has been your use of drugs/alcohol within the last 12 months?: NA Previous Attempts/Gestures: Yes How many times?: 3 Other Self Harm Risks: None Triggers for Past Attempts: None known Intentional Self Injurious Behavior: None Family Suicide History: Unknown Recent stressful life event(s): Other (Comment) (None) Persecutory voices/beliefs?: No Depression: Yes Depression  Symptoms: Loss of interest in usual pleasures, Feeling worthless/self pity, Feeling angry/irritable Substance abuse history and/or treatment for substance abuse?: No Suicide prevention information given to non-admitted patients: Not applicable  Risk to Others: Risk to Others within the past 6 months Homicidal Ideation: No Thoughts of Harm to Others: No Current Homicidal Intent: No Current Homicidal Plan: No Access to Homicidal Means: No Identified Victim: None History of harm to others?: No Assessment of Violence: None Noted Violent Behavior Description: None Does patient have access to weapons?: No Criminal Charges Pending?: No Does patient have a court date: No  Abuse: Abuse/Neglect Assessment (Assessment to be complete while patient is alone) Physical Abuse: Denies Verbal Abuse: Denies Sexual Abuse: Denies Exploitation of patient/patient's resources: Denies Self-Neglect: Denies  Prior Inpatient Therapy: Prior Inpatient Therapy Prior Inpatient Therapy: Yes Prior Therapy Dates: Unknown Prior Therapy Facilty/Provider(s): [place] Reason for Treatment: Bipolar  Prior Outpatient Therapy: Prior Outpatient Therapy Prior Outpatient Therapy: Yes Prior Therapy Dates: NA Prior Therapy Facilty/Provider(s): [facility] Reason for Treatment: Bipolar  Additional Information: Additional Information 1:1 In Past 12 Months?: No CIRT Risk: No Elopement Risk: No Does patient have medical clearance?: Yes    Objective: Blood pressure 117/85, pulse 105, temperature 97.5 F (36.4 C), temperature source Oral, resp. rate 18, height $RemoveBe'5\' 3"'FOWdpMCLx$  (1.6 m), weight 63.504 kg (140 lb), SpO2 97 %.Body mass index is 24.81 kg/(m^2). Results for orders placed or performed during the hospital encounter of 01/18/14 (from the past 72 hour(s))  POC CBG, ED     Status: Abnormal   Collection Time: 01/18/14  6:01 AM  Result Value Ref Range   Glucose-Capillary 338 (H) 70 - 99 mg/dL  Comprehensive metabolic panel      Status: Abnormal   Collection Time: 01/18/14  7:44 AM  Result Value Ref Range   Sodium 138 135 - 145 mmol/L    Comment: Please note change in reference range.   Potassium 4.2 3.5 - 5.1 mmol/L    Comment: Please note change in reference range.   Chloride 106 96 - 112 mEq/L   CO2 27 19 - 32 mmol/L   Glucose, Bld 266 (H) 70 - 99 mg/dL   BUN 21 6 - 23 mg/dL   Creatinine, Ser 0.83 0.50 - 1.10 mg/dL   Calcium 9.1 8.4 - 10.5 mg/dL   Total Protein 6.8 6.0 - 8.3 g/dL   Albumin 3.8 3.5 - 5.2 g/dL   AST 31 0 - 37 U/L   ALT 23 0 - 35 U/L   Alkaline Phosphatase 70 39 - 117 U/L   Total Bilirubin 0.6 0.3 - 1.2 mg/dL   GFR calc non Af Amer 73 (L) >90 mL/min   GFR calc Af Amer 85 (L) >90 mL/min    Comment: (NOTE) The eGFR has been calculated using the CKD EPI equation. This calculation has not been validated in all clinical situations. eGFR's persistently <90 mL/min signify possible Chronic Kidney Disease.    Anion gap 5 5 - 15  CBC with Differential     Status:  None   Collection Time: 01/18/14  7:44 AM  Result Value Ref Range   WBC 9.7 4.0 - 10.5 K/uL   RBC 5.09 3.87 - 5.11 MIL/uL   Hemoglobin 13.3 12.0 - 15.0 g/dL   HCT 41.8 36.0 - 46.0 %   MCV 82.1 78.0 - 100.0 fL   MCH 26.1 26.0 - 34.0 pg   MCHC 31.8 30.0 - 36.0 g/dL   RDW 13.4 11.5 - 15.5 %   Platelets 159 150 - 400 K/uL   Neutrophils Relative % 70 43 - 77 %   Neutro Abs 6.8 1.7 - 7.7 K/uL   Lymphocytes Relative 23 12 - 46 %   Lymphs Abs 2.2 0.7 - 4.0 K/uL   Monocytes Relative 7 3 - 12 %   Monocytes Absolute 0.7 0.1 - 1.0 K/uL   Eosinophils Relative 0 0 - 5 %   Eosinophils Absolute 0.0 0.0 - 0.7 K/uL   Basophils Relative 0 0 - 1 %   Basophils Absolute 0.0 0.0 - 0.1 K/uL  Ethanol     Status: None   Collection Time: 01/18/14  7:44 AM  Result Value Ref Range   Alcohol, Ethyl (B) <5 0 - 9 mg/dL    Comment:        LOWEST DETECTABLE LIMIT FOR SERUM ALCOHOL IS 11 mg/dL FOR MEDICAL PURPOSES ONLY   Lithium level     Status:  Abnormal   Collection Time: 01/18/14  8:00 AM  Result Value Ref Range   Lithium Lvl <0.06 (L) 0.80 - 1.40 mmol/L    Comment: REPEATED TO VERIFY  Urinalysis, Routine w reflex microscopic     Status: Abnormal   Collection Time: 01/18/14  8:04 AM  Result Value Ref Range   Color, Urine YELLOW YELLOW   APPearance CLEAR CLEAR   Specific Gravity, Urine 1.026 1.005 - 1.030   pH 5.5 5.0 - 8.0   Glucose, UA >1000 (A) NEGATIVE mg/dL   Hgb urine dipstick NEGATIVE NEGATIVE   Bilirubin Urine NEGATIVE NEGATIVE   Ketones, ur NEGATIVE NEGATIVE mg/dL   Protein, ur NEGATIVE NEGATIVE mg/dL   Urobilinogen, UA 0.2 0.0 - 1.0 mg/dL   Nitrite NEGATIVE NEGATIVE   Leukocytes, UA NEGATIVE NEGATIVE  Urine culture     Status: None   Collection Time: 01/18/14  8:04 AM  Result Value Ref Range   Specimen Description URINE, CLEAN CATCH    Special Requests Normal    Colony Count      9,000 COLONIES/ML Performed at [facility]    Culture      INSIGNIFICANT GROWTH Performed at [facility]    Report Status 01/19/2014 FINAL   Urine rapid drug screen (hosp performed)     Status: None   Collection Time: 01/18/14  8:04 AM  Result Value Ref Range   Opiates NONE DETECTED NONE DETECTED   Cocaine NONE DETECTED NONE DETECTED   Benzodiazepines NONE DETECTED NONE DETECTED   Amphetamines NONE DETECTED NONE DETECTED   Tetrahydrocannabinol NONE DETECTED NONE DETECTED   Barbiturates NONE DETECTED NONE DETECTED    Comment:        DRUG SCREEN FOR MEDICAL PURPOSES ONLY.  IF CONFIRMATION IS NEEDED FOR ANY PURPOSE, NOTIFY LAB WITHIN 5 DAYS.        LOWEST DETECTABLE LIMITS FOR URINE DRUG SCREEN Drug Class       Cutoff (ng/mL) Amphetamine      1000 Barbiturate      200 Benzodiazepine   211 Tricyclics  300 Opiates          300 Cocaine          300 THC              50   Urine microscopic-add on     Status: None   Collection Time: 01/18/14  8:04 AM  Result Value Ref Range   Squamous Epithelial  / LPF RARE RARE   WBC, UA 3-6 <3 WBC/hpf   RBC / HPF 0-2 <3 RBC/hpf   Bacteria, UA RARE RARE  CBG monitoring, ED     Status: Abnormal   Collection Time: 01/18/14  9:36 AM  Result Value Ref Range   Glucose-Capillary 274 (H) 70 - 99 mg/dL  CBG monitoring, ED     Status: Abnormal   Collection Time: 01/18/14  8:58 PM  Result Value Ref Range   Glucose-Capillary 266 (H) 70 - 99 mg/dL   Comment 1 Notify RN   CBG monitoring, ED     Status: Abnormal   Collection Time: 01/19/14  9:00 AM  Result Value Ref Range   Glucose-Capillary 117 (H) 70 - 99 mg/dL  CBG monitoring, ED     Status: Abnormal   Collection Time: 01/19/14 12:24 PM  Result Value Ref Range   Glucose-Capillary 141 (H) 70 - 99 mg/dL   Labs are reviewed and are pertinent for Unremarkable.  Current Facility-Administered Medications  Medication Dose Route Frequency Provider Last Rate Last Dose  . atorvastatin (LIPITOR) tablet 20 mg  20 mg Oral q morning - 10a Richarda Blade, MD   20 mg at 01/19/14 1006  . insulin aspart (novoLOG) injection 10 Units  10 Units Subcutaneous BID Richarda Blade, MD   5 Units at 01/19/14 1006  . insulin glargine (LANTUS) injection 10 Units  10 Units Subcutaneous Daily Richarda Blade, MD   10 Units at 01/19/14 1027  . insulin glargine (LANTUS) injection 30 Units  30 Units Subcutaneous QHS Richarda Blade, MD   30 Units at 01/18/14 2151  . lithium carbonate (ESKALITH) CR tablet 450 mg  450 mg Oral BID Richarda Blade, MD   450 mg at 01/19/14 1006  . risperiDONE (RISPERDAL) tablet 1 mg  1 mg Oral QHS Richarda Blade, MD   1 mg at 01/18/14 2242  . tiotropium (SPIRIVA) inhalation capsule 18 mcg  18 mcg Inhalation Daily PRN Richarda Blade, MD      . Vitamin D (Ergocalciferol) (DRISDOL) capsule 50,000 Units  50,000 Units Oral Q7 days Richarda Blade, MD   50,000 Units at 01/18/14 7829   Current Outpatient Prescriptions  Medication Sig Dispense Refill  . insulin glargine (LANTUS) 100 UNIT/ML injection Inject  10-30 Units into the skin 2 (two) times daily. 10 units every morning; 30 units at bedtime    . insulin lispro (HUMALOG) 100 UNIT/ML injection Inject 10 Units into the skin 2 (two) times daily. 10 units before breakfast and before dinner. Sliding scale if blood sugar over 200.    . tiotropium (SPIRIVA) 18 MCG inhalation capsule Place 18 mcg into inhaler and inhale daily as needed (for shortness of breath).    . Vitamin D, Ergocalciferol, (DRISDOL) 50000 UNITS CAPS capsule Take 50,000 Units by mouth every 7 (seven) days. Takes on Monday's    . atorvastatin (LIPITOR) 20 MG tablet Take 20 mg by mouth every morning.     . lithium carbonate (ESKALITH) 450 MG CR tablet Take 450 mg by mouth 2 (two) times daily.    Marland Kitchen  risperiDONE (RISPERDAL) 1 MG tablet Take 1 mg by mouth at bedtime.      Psychiatric Specialty Exam:     Blood pressure 117/85, pulse 105, temperature 97.5 F (36.4 C), temperature source Oral, resp. rate 18, height $RemoveBe'5\' 3"'RynzYRSXZ$  (1.6 m), weight 63.504 kg (140 lb), SpO2 97 %.Body mass index is 24.81 kg/(m^2).  General Appearance: Casual  Eye Contact::  Poor  Speech:  Clear and Coherent and Slow  Volume:  Normal  Mood:  Depressed and Dysphoric  Affect:  Congruent, Depressed and Flat  Thought Process:  Coherent, Goal Directed and Intact  Orientation:  Full (Time, Place, and Person)  Thought Content:  WDL  Suicidal Thoughts:  No  Homicidal Thoughts:  No  Memory:  Immediate;   Good Recent;   Good Remote;   Good  Judgement:  Poor  Insight:  Shallow  Psychomotor Activity:  Normal  Concentration:  Good  Recall:  NA  Fund of Knowledge:Fair  Language: Fair  Akathisia:  NA  Handed:  Right  AIMS (if indicated):     Assets:  Desire for Improvement  Sleep:      Musculoskeletal: Strength & Muscle Tone: within normal limits Gait & Station: normal Patient leans: N/A  Treatment Plan Summary: Daily contact with patient to assess and evaluate symptoms and progress in treatment Medication  management  -Inpatient psychiatric hospitalization for safety and stabilization -Referred to [facility], awaiting placement -Other referrals sent as well.   Benjamine Mola, FNP-BC 01/19/2014 12:33 PM   Patient seen, evaluated and I agree with notes by Nurse Practitioner. Corena Pilgrim, MD

## 2014-01-19 NOTE — Progress Notes (Signed)
CSW completed IVC paperwork for patient. CSW faxed 4 copies to the night magistrate.   CSW reached out to night magistrate to confirm that the IVC documentation was received. According to [employer], the papers were received and a magistrate should be to the ED soon to serve the IVC paperwork.  Willette Brace [phone] ED CSW 01/19/2014 9:08 PM

## 2014-01-19 NOTE — ED Notes (Signed)
JB-patient's [phone]

## 2014-01-19 NOTE — [facility] Counselor (Addendum)
TTS Counselor faxed referrals to the following facilities in effort to secure inpt gero-psych placement:  [place]

## 2014-01-19 NOTE — [facility] Counselor (Signed)
TTS received call from [facility] gero-psych. They are willing to consider pt if chest x-ray and EKG are sent. TTS Counselor informed pt's attending RN, Estill Bamberg.   Ramond Dial, [facility] Triage Specialist

## 2014-01-20 LAB — CBG MONITORING, ED: GLUCOSE-CAPILLARY: 117 mg/dL — AB (ref 70–99)

## 2014-01-20 NOTE — ED Notes (Addendum)
Pt discharged into [facility]'s custody to be transported to [place]. Verbalized understanding discharge instructions. In no acute distress.  Report called to [facility].  All belongings returned and belongings sheet signed.  Pt made aware that her daughter took her glasses and a pouch containing several cards, including a social security card, home  Pt attempted to sign for discharge, but pad did not work.

## 2014-01-20 NOTE — ED Notes (Signed)
Pt asked when she would be getting her clothes back and was informed that she would receive them, when her transportation arrived.  Pt then asked where she was going and was informed that she has a bed at [facility].  Pt reports that she does not want to go to [place].  This RN explained she is required to go, due to a judge's order.  Pt replied "who is paying for this?"  This RN explained that I do not know, because I do not handle anything involving payment.

## 2014-01-20 NOTE — ED Notes (Signed)
Per previous shift RN, Kathryn Mcguire would like [place] to wait until after breakfast for transfer.

## 2014-06-08 DIAGNOSIS — Z9114 Patient's other noncompliance with medication regimen: Secondary | ICD-10-CM | POA: Insufficient documentation

## 2015-10-05 IMAGING — CR DG CHEST 2V
2 series · 2 of 2 positions shown · non-contrast
Comparison: 10/06/2007

CLINICAL DATA: Chest fluttering.  Diabetes. Hypertension.

EXAM:
CHEST  2 VIEW

[w chest pa]
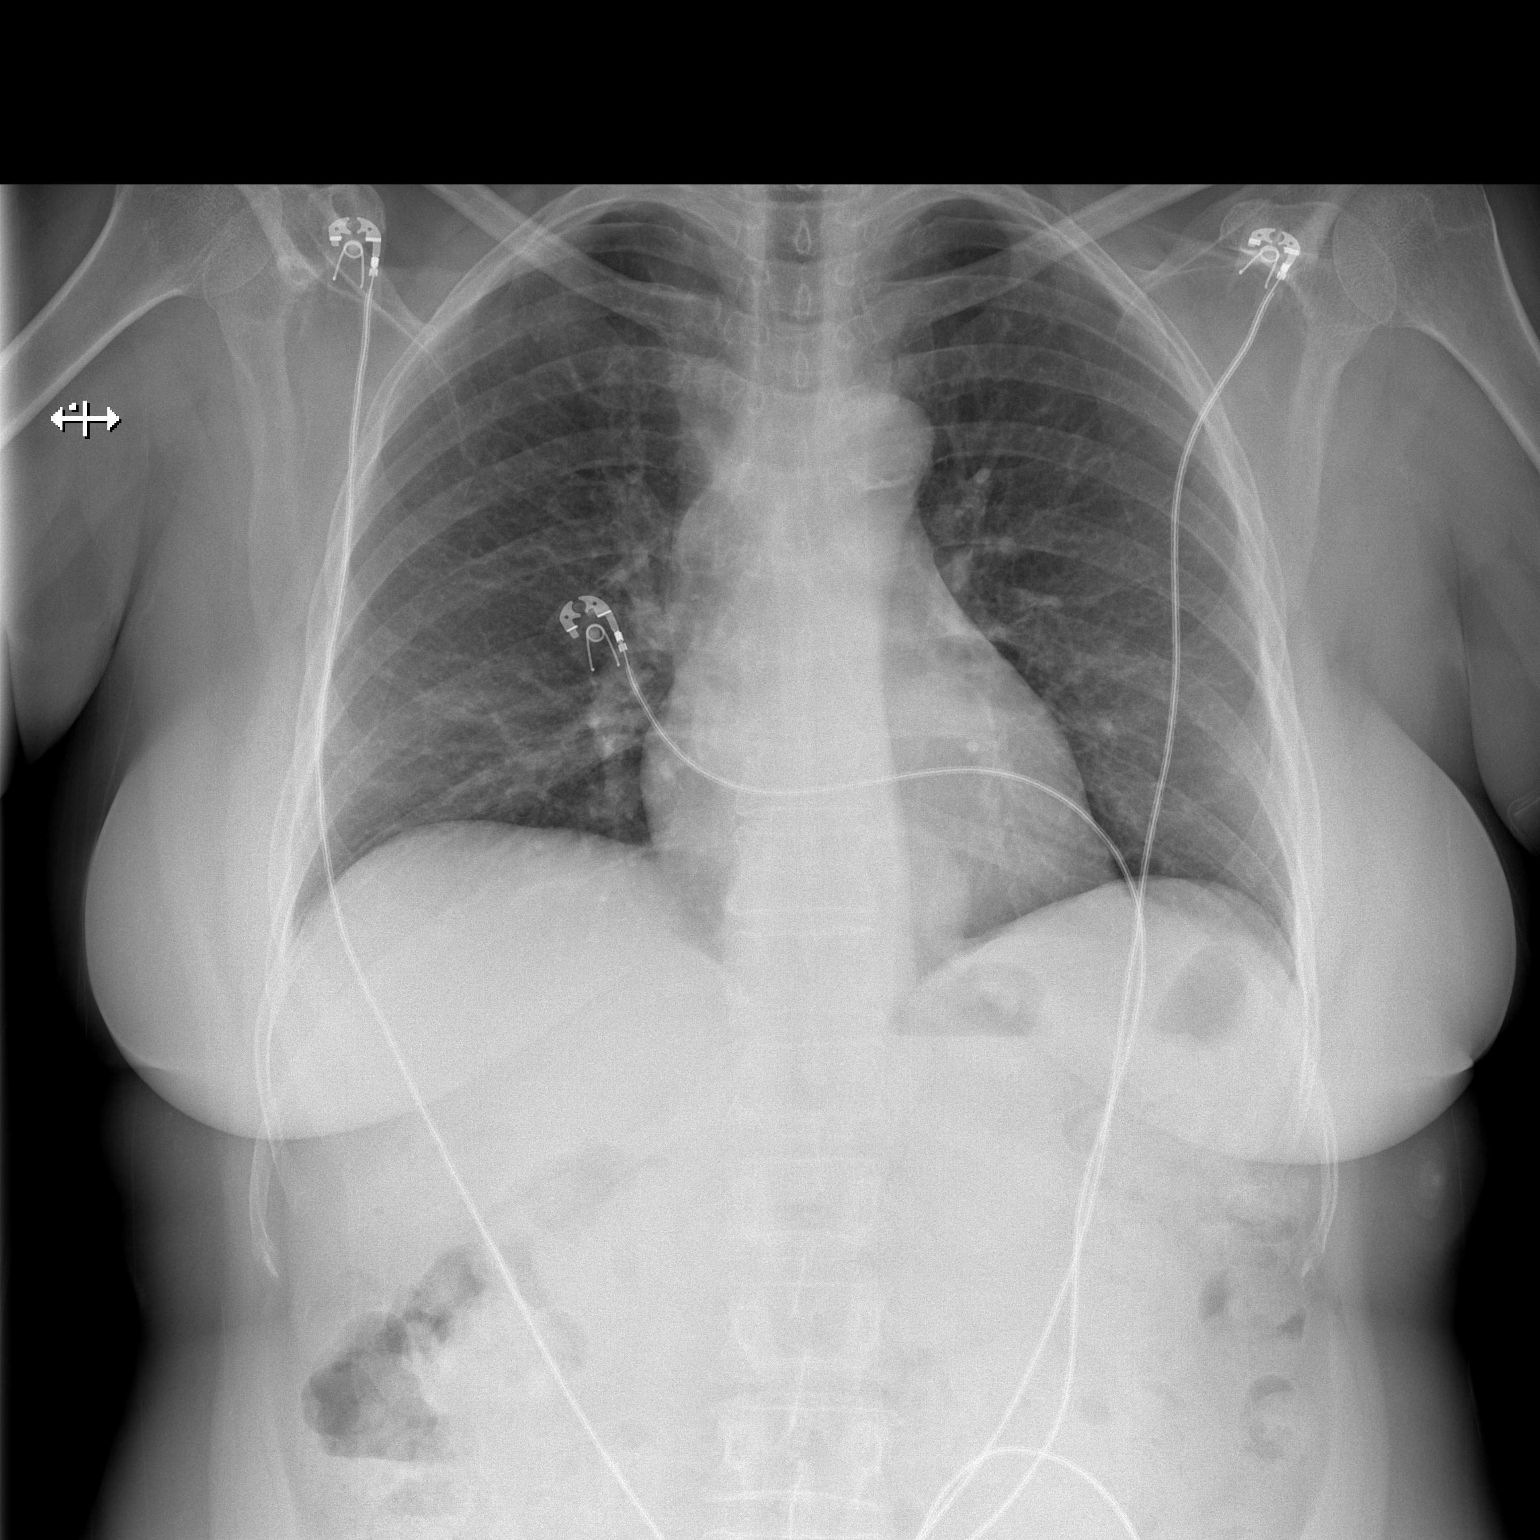

[w chest lat]
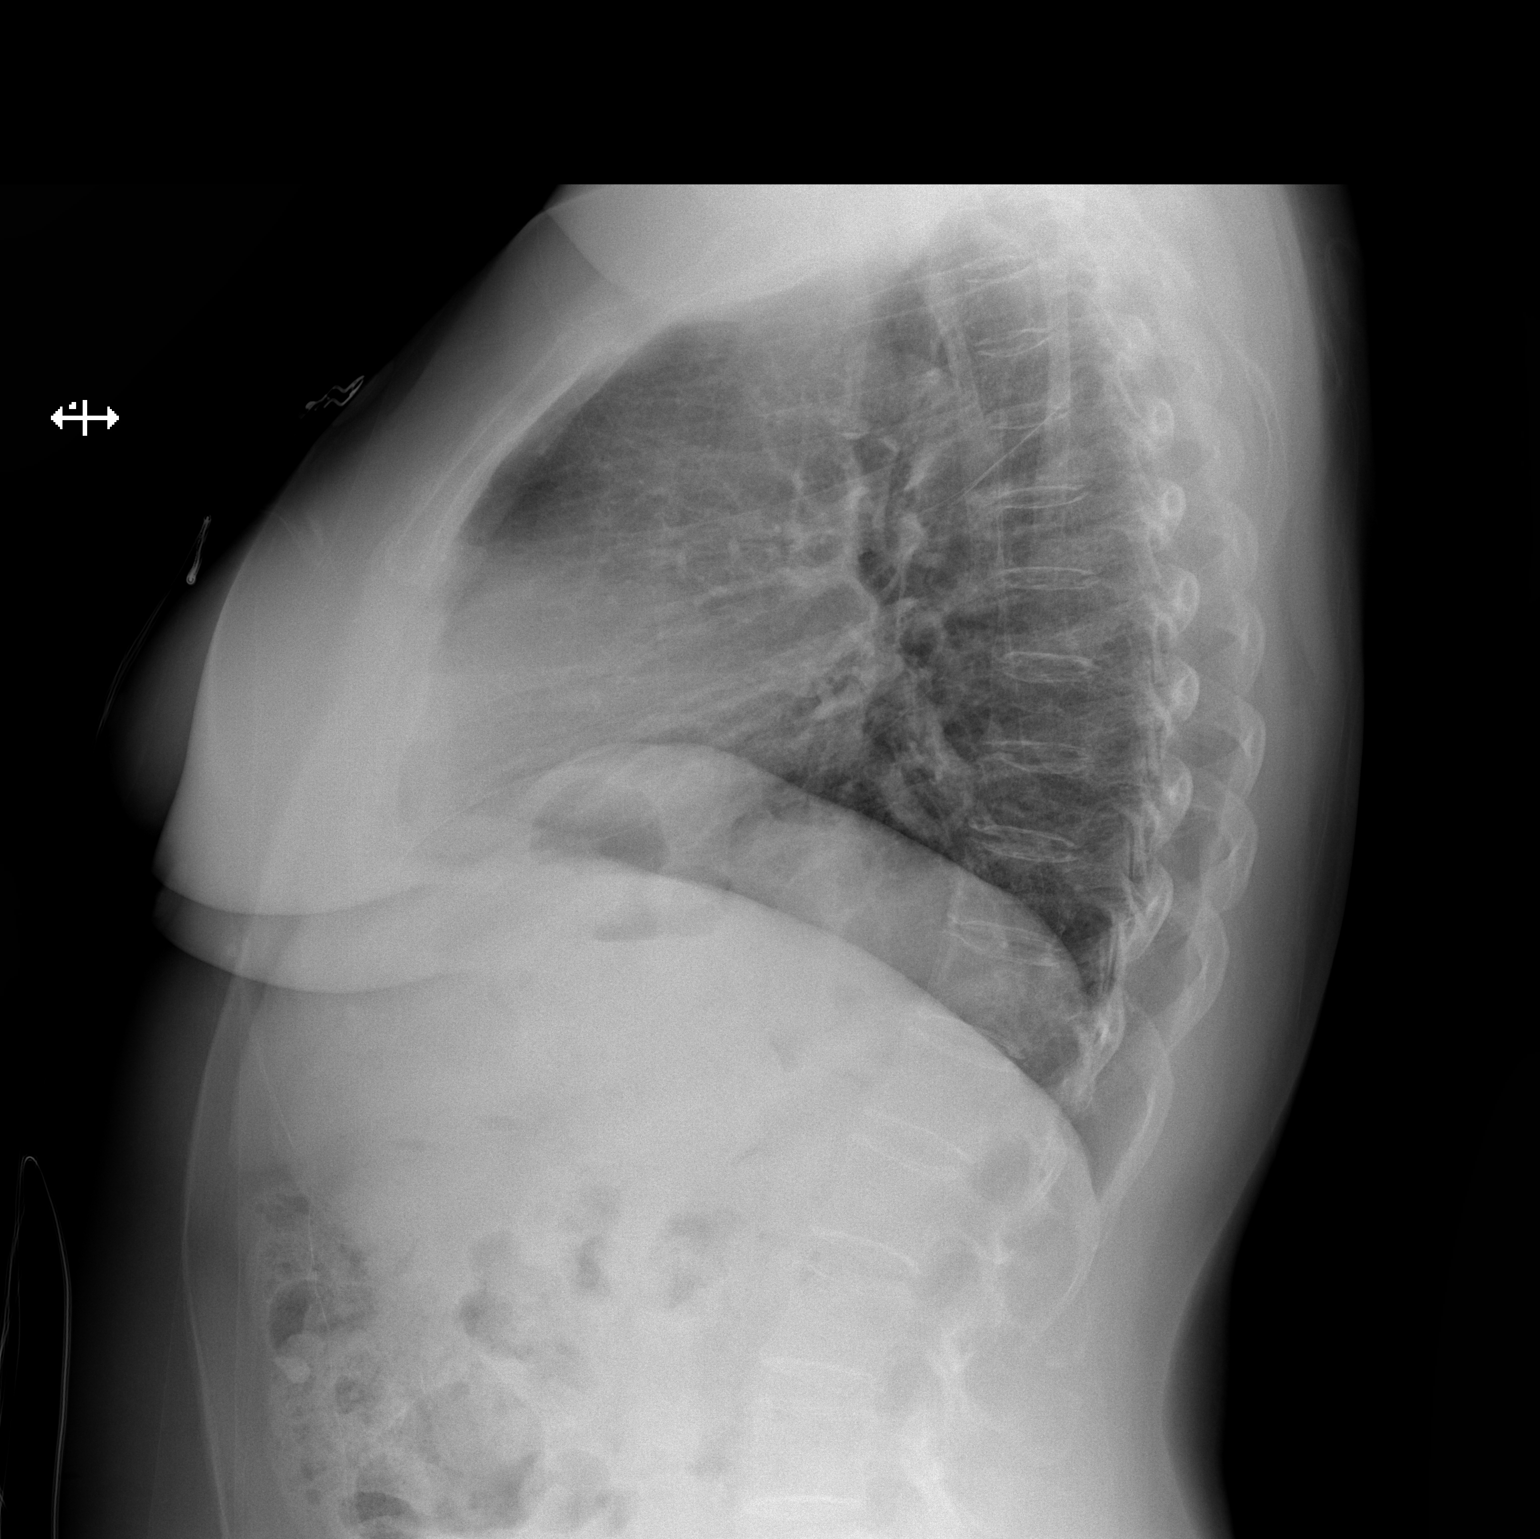

[2 of 2 positions shown; findings below may reference images not displayed]

FINDINGS: Artifact overlies the chest. Heart size is normal. There is a hiatal
hernia. There is calcification of the thoracic aorta. The pulmonary
vascularity is normal. The lungs are clear. No effusions. No bony
abnormalities.
IMPRESSION: No active cardiopulmonary disease. Atherosclerosis of the aorta .
Hiatal hernia.

## 2016-01-18 LAB — HM DEXA SCAN

## 2016-12-12 ENCOUNTER — Encounter: Payer: Self-pay | Admitting: Internal Medicine

## 2016-12-18 ENCOUNTER — Ambulatory Visit: Payer: Self-pay | Admitting: Physician Assistant

## 2016-12-21 ENCOUNTER — Ambulatory Visit (INDEPENDENT_AMBULATORY_CARE_PROVIDER_SITE_OTHER): Payer: Medicare Other | Admitting: Physician Assistant

## 2016-12-21 ENCOUNTER — Encounter: Payer: Self-pay | Admitting: Physician Assistant

## 2016-12-21 ENCOUNTER — Other Ambulatory Visit: Payer: Self-pay

## 2016-12-21 VITALS — BP 98/70 | HR 84 | Temp 98.6°F | Resp 14 | Ht 63.0 in | Wt 136.0 lb

## 2016-12-21 DIAGNOSIS — I10 Essential (primary) hypertension: Secondary | ICD-10-CM | POA: Diagnosis not present

## 2016-12-21 DIAGNOSIS — M810 Age-related osteoporosis without current pathological fracture: Secondary | ICD-10-CM

## 2016-12-21 DIAGNOSIS — E785 Hyperlipidemia, unspecified: Secondary | ICD-10-CM

## 2016-12-21 DIAGNOSIS — E119 Type 2 diabetes mellitus without complications: Secondary | ICD-10-CM

## 2016-12-21 DIAGNOSIS — F319 Bipolar disorder, unspecified: Secondary | ICD-10-CM

## 2016-12-21 DIAGNOSIS — Z794 Long term (current) use of insulin: Secondary | ICD-10-CM

## 2016-12-21 DIAGNOSIS — Z1211 Encounter for screening for malignant neoplasm of colon: Secondary | ICD-10-CM

## 2016-12-21 LAB — CBC WITH DIFFERENTIAL/PLATELET
BASOS ABS: 0.1 10*3/uL (ref 0.0–0.1)
Basophils Relative: 0.7 % (ref 0.0–3.0)
Eosinophils Absolute: 0.1 10*3/uL (ref 0.0–0.7)
Eosinophils Relative: 0.7 % (ref 0.0–5.0)
HCT: 46.1 % — ABNORMAL HIGH (ref 36.0–46.0)
Hemoglobin: 14.7 g/dL (ref 12.0–15.0)
LYMPHS ABS: 1.9 10*3/uL (ref 0.7–4.0)
LYMPHS PCT: 25.9 % (ref 12.0–46.0)
MCHC: 32 g/dL (ref 30.0–36.0)
MCV: 83.4 fl (ref 78.0–100.0)
MONOS PCT: 6.9 % (ref 3.0–12.0)
Monocytes Absolute: 0.5 10*3/uL (ref 0.1–1.0)
NEUTROS PCT: 65.8 % (ref 43.0–77.0)
Neutro Abs: 4.9 10*3/uL (ref 1.4–7.7)
Platelets: 184 10*3/uL (ref 150.0–400.0)
RBC: 5.53 Mil/uL — AB (ref 3.87–5.11)
RDW: 13.4 % (ref 11.5–15.5)
WBC: 7.4 10*3/uL (ref 4.0–10.5)

## 2016-12-21 LAB — COMPREHENSIVE METABOLIC PANEL
ALK PHOS: 86 U/L (ref 39–117)
ALT: 18 U/L (ref 0–35)
AST: 17 U/L (ref 0–37)
Albumin: 4.4 g/dL (ref 3.5–5.2)
BILIRUBIN TOTAL: 0.4 mg/dL (ref 0.2–1.2)
BUN: 12 mg/dL (ref 6–23)
CO2: 30 mEq/L (ref 19–32)
Calcium: 9.9 mg/dL (ref 8.4–10.5)
Chloride: 96 mEq/L (ref 96–112)
Creatinine, Ser: 0.8 mg/dL (ref 0.40–1.20)
GFR: 92.24 mL/min (ref >60.00)
GLUCOSE: 265 mg/dL — AB (ref 70–99)
Potassium: 4.9 mEq/L (ref 3.5–5.1)
Sodium: 134 mEq/L — ABNORMAL LOW (ref 135–145)
TOTAL PROTEIN: 7.1 g/dL (ref 6.0–8.3)

## 2016-12-21 NOTE — Patient Instructions (Signed)
Please go to the lab today for blood work.  I will call you with your results. We will alter treatment regimen(s) if indicated by your results.   Please start the Crestor as directed. I am setting you up with one of our specialists for assessment and management of diabetes.   Follow-up with me in 4 weeks so we can recheck your cholesterol levels.

## 2016-12-22 LAB — DRUG SCREEN 764883 11+OXYCO+ALC+CRT-BUND
AMPHETAMINES, URINE: NEGATIVE ng/mL
BARBITURATE: NEGATIVE ng/mL
BENZODIAZ UR QL: NEGATIVE ng/mL
COCAINE (METABOLITE): NEGATIVE ng/mL
CREATININE: 76.4 mg/dL (ref 20.0–300.0)
Cannabinoid Quant, Ur: NEGATIVE ng/mL
ETHANOL: NEGATIVE %
Meperidine: NEGATIVE ng/mL
Methadone Screen, Urine: NEGATIVE ng/mL
OPIATE SCREEN URINE: NEGATIVE ng/mL
Oxycodone/Oxymorphone, Urine: NEGATIVE ng/mL
Phencyclidine: NEGATIVE ng/mL
Propoxyphene: NEGATIVE ng/mL
TRAMADOL: NEGATIVE ng/mL
pH, Urine: 5.5 (ref 4.5–8.9)

## 2016-12-25 ENCOUNTER — Encounter: Payer: Self-pay | Admitting: Emergency Medicine

## 2016-12-25 ENCOUNTER — Telehealth: Payer: Self-pay | Admitting: Emergency Medicine

## 2016-12-25 NOTE — Telephone Encounter (Signed)
FYI: Spoke with [facility] patient daughter of lab results. Ask how patient was behaving. She states patient is chain smoking. Smoking a pack of cigarettes within 6 hours and eating up everything. She is eating every 45 min. Walking all night, not compliant with her medications. Patient declines going to a Psychiatrist to manage the Bipolar Depression.

## 2016-12-25 NOTE — Telephone Encounter (Signed)
If patient is refusing treatment for Bipolar disorder there is not much I can do if she is not a threat to herself or others. She does have a follow-up scheduled with me where we can again address her smoking and mood. She denies all at visit and is alert and oriented when answering questions. They can move up her follow-up appointment if they would like so we can see if she will restart Lithium while I get her in with another psychiatric provider here in the area.

## 2016-12-26 ENCOUNTER — Encounter: Payer: Self-pay | Admitting: Physician Assistant

## 2016-12-26 NOTE — Telephone Encounter (Signed)
I am referring her to Dr. Si Raider -- neuropsychology as discussed at visit. She will be contacted to schedule.   We will discuss further at appointment Friday.

## 2016-12-26 NOTE — Telephone Encounter (Signed)
Called and left a detailed message per DPR with PCP recommendations. Ok to schedule a sooner appointment with PCP.   [place] [facility] to Discuss results / PCP recommendations / Schedule patient.

## 2016-12-26 NOTE — Telephone Encounter (Signed)
Daughter Glenard Haring called and an appt scheduled for this Friday 12/28/16  @ 1045.  Daughter states that she is trying to schedule an intervention for her Mother. States pt is having substance abuse issues with marijuana and non prescribed pills. She said that she is trying to avoid involuntary commitment- which daughter says has not worked in the past.    Daughter was asking if there was any information about a referral

## 2016-12-26 NOTE — Telephone Encounter (Signed)
This encounter was created in error - please disregard.

## 2016-12-26 NOTE — Telephone Encounter (Signed)
Please advise 

## 2016-12-28 ENCOUNTER — Ambulatory Visit (INDEPENDENT_AMBULATORY_CARE_PROVIDER_SITE_OTHER): Payer: Medicare Other | Admitting: Physician Assistant

## 2016-12-28 ENCOUNTER — Ambulatory Visit: Payer: Self-pay | Admitting: Physician Assistant

## 2016-12-28 ENCOUNTER — Encounter: Payer: Self-pay | Admitting: Physician Assistant

## 2016-12-28 ENCOUNTER — Other Ambulatory Visit: Payer: Self-pay | Admitting: General Practice

## 2016-12-28 ENCOUNTER — Other Ambulatory Visit: Payer: Self-pay

## 2016-12-28 ENCOUNTER — Other Ambulatory Visit: Payer: Medicare Other

## 2016-12-28 VITALS — BP 110/76 | HR 74 | Temp 98.3°F | Resp 17 | Ht 63.0 in | Wt 134.2 lb

## 2016-12-28 DIAGNOSIS — E785 Hyperlipidemia, unspecified: Secondary | ICD-10-CM

## 2016-12-28 DIAGNOSIS — R8290 Unspecified abnormal findings in urine: Secondary | ICD-10-CM

## 2016-12-28 DIAGNOSIS — Z794 Long term (current) use of insulin: Secondary | ICD-10-CM

## 2016-12-28 DIAGNOSIS — Z9114 Patient's other noncompliance with medication regimen: Secondary | ICD-10-CM | POA: Diagnosis not present

## 2016-12-28 DIAGNOSIS — R739 Hyperglycemia, unspecified: Secondary | ICD-10-CM | POA: Diagnosis not present

## 2016-12-28 DIAGNOSIS — F319 Bipolar disorder, unspecified: Secondary | ICD-10-CM

## 2016-12-28 DIAGNOSIS — Z91148 Patient's other noncompliance with medication regimen for other reason: Secondary | ICD-10-CM

## 2016-12-28 DIAGNOSIS — Z1239 Encounter for other screening for malignant neoplasm of breast: Secondary | ICD-10-CM

## 2016-12-28 DIAGNOSIS — R4189 Other symptoms and signs involving cognitive functions and awareness: Secondary | ICD-10-CM | POA: Diagnosis not present

## 2016-12-28 DIAGNOSIS — E119 Type 2 diabetes mellitus without complications: Secondary | ICD-10-CM | POA: Diagnosis not present

## 2016-12-28 LAB — URINALYSIS, ROUTINE W REFLEX MICROSCOPIC
BILIRUBIN URINE: NEGATIVE
KETONES UR: 15 — AB
LEUKOCYTES UA: NEGATIVE
NITRITE: NEGATIVE
Specific Gravity, Urine: 1.01 (ref 1.000–1.030)
TOTAL PROTEIN, URINE-UPE24: NEGATIVE
UROBILINOGEN UA: 0.2 (ref 0.0–1.0)
pH: 6.5 (ref 5.0–8.0)

## 2016-12-28 LAB — MICROALBUMIN / CREATININE URINE RATIO
Creatinine,U: 48.5 mg/dL
MICROALB UR: 1.9 mg/dL (ref 0.0–1.9)
MICROALB/CREAT RATIO: 4 mg/g (ref 0.0–30.0)

## 2016-12-28 LAB — LIPID PANEL
CHOL/HDL RATIO: 8
Cholesterol: 337 mg/dL — ABNORMAL HIGH (ref 0–200)
HDL: 42.8 mg/dL (ref >39.00)
LDL Cholesterol: 254 mg/dL — ABNORMAL HIGH (ref 0–99)
NONHDL: 294.02
Triglycerides: 199 mg/dL — ABNORMAL HIGH (ref 0.0–149.0)
VLDL: 39.8 mg/dL (ref 0.0–40.0)

## 2016-12-28 LAB — HEMOGLOBIN A1C: Hgb A1c MFr Bld: 15.1 % — ABNORMAL HIGH (ref 4.6–6.5)

## 2016-12-28 MED ORDER — ATORVASTATIN CALCIUM 40 MG PO TABS: 40.0000 mg | ORAL_TABLET | Freq: Every day | ORAL | 3 refills | Status: DC

## 2016-12-28 NOTE — Progress Notes (Signed)
a 

## 2016-12-28 NOTE — Patient Instructions (Signed)
Please go to the lab today for blood work.  I will call you with your results. We will alter treatment regimen(s) if indicated by your results.   You will be contacted for a mammogram and repeat colonoscopy. Your bone density is up-to-date per [facility] records. I am trying to get a written copy of results to review. We are also setting you up with new Endo here in the area as discussed at last visit.  Your pneumonia shot was updated today.

## 2016-12-30 DIAGNOSIS — Z7689 Persons encountering health services in other specified circumstances: Secondary | ICD-10-CM | POA: Insufficient documentation

## 2016-12-30 NOTE — Progress Notes (Signed)
 Patient presents to clinic today for repeat labs and to discuss concerns her daughter and family has mentioned regarding her mental and physical health. Family has concerns regarding Bipolar disorder as she is not currently on any medication. They have concerns about recreational drug use and non-adherence with medications for DM. Patient denies any recreational drug use or alcohol consumption. States she does currently smoke about 3-4 cigarettes per day which helps with her mood. States she sleeps and eats well. Denies manic behavior. UDS testing at last visit was negative for any illegal substances. States she does sometimes forget her diabetes medications but otherwise takes as directed. Is not checking sugars. Appointment pending with new Endocrinologist. Patient is fasting for repeat A1C and lipids today. Is also overdue for mammogram..   Past Medical History:  Diagnosis Date  . Arthritis   . Bipolar depression (HCC)   . COPD (chronic obstructive pulmonary disease) (HCC)   . Diabetes mellitus without complication (HCC)   . Elevated LFTs   . Emphysema of lung (HCC)   . GERD (gastroesophageal reflux disease)   . History of adenomatous polyps of colon 10/15/2013  . Hyperlipemia   . Hypertension   . UTI (lower urinary tract infection)     Current Outpatient Medications on File Prior to Visit  Medication Sig Dispense Refill  . glimepiride (AMARYL) 4 MG tablet Take 1 tablet by mouth every morning.    . Insulin Syringe-Needle U-100 (BD VEO INSULIN SYRINGE U/F) 31G X 15/64" 0.5 ML MISC USE AS DIRECTED TWICE DAILY WITH INSULIN    . metFORMIN (GLUCOPHAGE-XR) 500 MG 24 hr tablet TAKE ONE TABLET BY MOUTH WITH BREAKFAST AND ONE WITH SUPPER FOR  DIABETES    . NOVOLIN 70/30 (70-30) 100 UNIT/ML injection Inject 35 units with breakfast and dinner  0  . lithium carbonate (ESKALITH) 450 MG CR tablet Take 450 mg by mouth 2 (two) times daily.     No current facility-administered medications on file prior  to visit.     Allergies  Allergen Reactions  . Metformin And Related Nausea And Vomiting    Family History  Problem Relation Age of Onset  . Colon cancer Neg Hx   . Stomach cancer Neg Hx   . Esophageal cancer Neg Hx   . Rectal cancer Neg Hx     Social History   Socioeconomic History  . Marital status: Single    Spouse name: None  . Number of children: 4  . Years of education: None  . Highest education level: None  Social Needs  . Financial resource strain: None  . Food insecurity - worry: None  . Food insecurity - inability: None  . Transportation needs - medical: None  . Transportation needs - non-medical: None  Occupational History  . Occupation: Retired   Tobacco Use  . Smoking status: Current Some Day Smoker    Packs/day: 1.00    Types: Cigarettes  . Smokeless tobacco: Former User    Types: Chew  Substance and Sexual Activity  . Alcohol use: Yes    Comment: beer  . Drug use: No  . Sexual activity: None  Other Topics Concern  . None  Social History Narrative   Daily caffeine     Review of Systems - See HPI.  All other ROS are negative.  BP 110/76   Pulse 74   Temp 98.3 F (36.8 C) (Oral)   Resp 17   Ht 5' 3" (1.6 m)   Wt 134 lb 4   oz (60.9 kg)   SpO2 96%   BMI 23.78 kg/m   Physical Exam  Constitutional: She is oriented to person, place, and time and well-developed, well-nourished, and in no distress.  HENT:  Head: Normocephalic and atraumatic.  Eyes: Conjunctivae are normal.  Neck: Neck supple.  Cardiovascular: Normal rate, regular rhythm, normal heart sounds and intact distal pulses.  Pulmonary/Chest: Effort normal and breath sounds normal. No respiratory distress. She has no wheezes. She has no rales. She exhibits no tenderness.  Lymphadenopathy:    She has no cervical adenopathy.  Neurological: She is alert and oriented to person, place, and time. No cranial nerve deficit. Gait normal.  Skin: Skin is warm and dry. No rash noted.    Psychiatric: Affect normal. Her mood appears not anxious. Her affect is not labile. She is not agitated. She does not express impulsivity. She does not exhibit a depressed mood. She expresses no homicidal and no suicidal ideation. She expresses no suicidal plans and no homicidal plans. She is not apathetic. She exhibits ordered thought content. She does not have a flat affect.  Vitals reviewed.  Recent Results (from the past 2160 hour(s))  CBC w/Diff     Status: Abnormal   Collection Time: 12/21/16 11:34 AM  Result Value Ref Range   WBC 7.4 4.0 - 10.5 K/uL   RBC 5.53 (H) 3.87 - 5.11 Mil/uL   Hemoglobin 14.7 12.0 - 15.0 g/dL   HCT 46.1 (H) 36.0 - 46.0 %   MCV 83.4 78.0 - 100.0 fl   MCHC 32.0 30.0 - 36.0 g/dL   RDW 13.4 11.5 - 15.5 %   Platelets 184.0 150.0 - 400.0 K/uL   Neutrophils Relative % 65.8 43.0 - 77.0 %   Lymphocytes Relative 25.9 12.0 - 46.0 %   Monocytes Relative 6.9 3.0 - 12.0 %   Eosinophils Relative 0.7 0.0 - 5.0 %   Basophils Relative 0.7 0.0 - 3.0 %   Neutro Abs 4.9 1.4 - 7.7 K/uL   Lymphs Abs 1.9 0.7 - 4.0 K/uL   Monocytes Absolute 0.5 0.1 - 1.0 K/uL   Eosinophils Absolute 0.1 0.0 - 0.7 K/uL   Basophils Absolute 0.1 0.0 - 0.1 K/uL  Comp Met (CMET)     Status: Abnormal   Collection Time: 12/21/16 11:34 AM  Result Value Ref Range   Sodium 134 (L) 135 - 145 mEq/L   Potassium 4.9 3.5 - 5.1 mEq/L   Chloride 96 96 - 112 mEq/L   CO2 30 19 - 32 mEq/L   Glucose, Bld 265 (H) 70 - 99 mg/dL   BUN 12 6 - 23 mg/dL   Creatinine, Ser 0.80 0.40 - 1.20 mg/dL   Total Bilirubin 0.4 0.2 - 1.2 mg/dL   Alkaline Phosphatase 86 39 - 117 U/L   AST 17 0 - 37 U/L   ALT 18 0 - 35 U/L   Total Protein 7.1 6.0 - 8.3 g/dL   Albumin 4.4 3.5 - 5.2 g/dL   Calcium 9.9 8.4 - 10.5 mg/dL   GFR 92.24 >60.00 mL/min  [id number] 11+Oxyco+Alc+Crt-Bund     Status: None   Collection Time: 12/21/16 11:40 AM  Result Value Ref Range   Ethanol Negative Cutoff=0.020 %   Amphetamines, Urine Negative  Cutoff=1000 ng/mL    Comment: Amphetamine test includes Amphetamine and Methamphetamine.   Barbiturate Negative Cutoff=200 ng/mL   BENZODIAZ UR QL Negative Cutoff=200 ng/mL   Cannabinoid Quant, Ur Negative Cutoff=50 ng/mL   Cocaine (Metabolite) Negative Cutoff=300 ng/mL     OPIATE SCREEN URINE Negative Cutoff=300 ng/mL    Comment: Opiate test includes Codeine, Morphine, Hydromorphone, Hydrocodone.   OXYCODONE+OXYMORPHONE UR QL SCN Negative Cutoff=300 ng/mL    Comment: Test includes Oxycodone and Oxymorphone   Phencyclidine Negative Cutoff=25 ng/mL   Methadone Screen, Urine Negative Cutoff=300 ng/mL   Propoxyphene Negative Cutoff=300 ng/mL   Meperidine Negative Cutoff=200 ng/mL    Comment: This test was developed and its performance characteristics determined by [facility]. It has not been cleared or approved by the Food and Drug Administration.    Tramadol Negative Cutoff=200 ng/mL   Creatinine 76.4 20.0 - 300.0 mg/dL   PH OF URINE 5.5 4.5 - 8.9  Hemoglobin A1c     Status: Abnormal   Collection Time: 12/28/16 11:13 AM  Result Value Ref Range   Hgb A1c MFr Bld 15.1 (H) 4.6 - 6.5 %    Comment: Glycemic Control Guidelines for People with Diabetes:Non Diabetic:  <6%Goal of Therapy: <7%Additional Action Suggested:  >8%   Lipid panel     Status: Abnormal   Collection Time: 12/28/16 11:13 AM  Result Value Ref Range   Cholesterol 337 (H) 0 - 200 mg/dL    Comment: ATP III Classification       Desirable:  < 200 mg/dL               Borderline High:  200 - 239 mg/dL          High:  > = 240 mg/dL   Triglycerides 199.0 (H) 0.0 - 149.0 mg/dL    Comment: Normal:  <150 mg/dLBorderline High:  150 - 199 mg/dL   HDL 42.80 >39.00 mg/dL   VLDL 39.8 0.0 - 40.0 mg/dL   LDL Cholesterol 254 (H) 0 - 99 mg/dL   Total CHOL/HDL Ratio 8     Comment:                Men          Women1/2 Average Risk     3.4          3.3Average Risk          5.0          4.42X Average Risk          9.6          7.13X Average Risk           15.0          11.0                       NonHDL 294.02     Comment: NOTE:  Non-HDL goal should be 30 mg/dL higher than patient's LDL goal (i.e. LDL goal of < 70 mg/dL, would have non-HDL goal of < 100 mg/dL)  Urine Microalbumin w/creat. ratio     Status: None   Collection Time: 12/28/16 11:13 AM  Result Value Ref Range   Microalb, Ur 1.9 0.0 - 1.9 mg/dL   Creatinine,U 48.5 mg/dL   Microalb Creat Ratio 4.0 0.0 - 30.0 mg/g  Urinalysis, Routine w reflex microscopic     Status: Abnormal   Collection Time: 12/28/16 11:13 AM  Result Value Ref Range   Color, Urine YELLOW Yellow;Lt. Yellow   APPearance CLEAR Clear   Specific Gravity, Urine 1.010 1.000 - 1.030   pH 6.5 5.0 - 8.0   Total Protein, Urine NEGATIVE Negative   Urine Glucose >=1000 (A) Negative   Ketones, ur 15 (A) Negative   Bilirubin Urine  NEGATIVE Negative   Hgb urine dipstick MODERATE (A) Negative   Urobilinogen, UA 0.2 0.0 - 1.0   Leukocytes, UA NEGATIVE Negative   Nitrite NEGATIVE Negative   WBC, UA 21-50/hpf (A) 0-2/hpf   RBC / HPF 7-10/hpf (A) 0-2/hpf   Squamous Epithelial / LPF Rare(0-4/hpf) Rare(0-4/hpf)   Bacteria, UA Many(>50/hpf) (A) None   Assessment/Plan: 1. Breast cancer screening Order for screening mammogram placed. - MM SCREENING BREAST TOMO BILATERAL; Future  2. Type 2 diabetes mellitus without complication, with long-term current use of insulin (HCC) Foot exam updated last visit. Flu shot updated today. Appt with new Endo pending. Discussed importance of compliance with medications. Labs today. - Hemoglobin A1c - Lipid panel - Urine Microalbumin w/creat. ratio - Urinalysis, Routine w reflex microscopic  3. Dyslipidemia Fasting lipid today.  4. Bipolar 1 disorder, depressed (HCC) Patient denies any illicit substance use, manic behavior, depressed mood. Is agreeable to neuropsych evaluation for cognitive function.   5. Noncompliance with medication regimen Noted by family. Patient denies  overall. Reviewing her Endo records in care everywhere question if she is being honest about taking medication giving such a high A1C previously with insulin use. Repeat labs today to further assess.    [place] , PA-C 

## 2016-12-30 NOTE — Progress Notes (Signed)
Patient presents to clinic today to establish care. Patient is currently followed by Dr. Awilda Metro with [facility] for her Diabetes Mellitus and [facility] for her Bipolar Disorder. She is wanting to find specialists closer to the area.   Acute Concerns: Patient denies acute concerns today.  Chronic Issues: Diabetes Mellitus -- Patient is currently on a regimen of Metformin 500 mg BID and Novolin 70/30, 35 units QPM. Denies BID dosing. Endorses taking medications as directed. Denies vision changes, neuropathy or history of nephropathy. Is not currently checking her fasting sugars. Is requesting a specialist here in [place] with [facility].   Bipolar Disorder -- Long-standing history. Patient endorses prior treatment with lithium 450 mg BID most recently. States she was weaned off of this by [employer]. Does not have follow-up scheduled. States she feels fine off of medication. Denies depressed mood, anhedonia, anxiety, manic symptoms, SI/HI. Denies any changes to memory.  Past Medical History:  Diagnosis Date  . Arthritis   . Bipolar depression ([place])   . COPD (chronic obstructive pulmonary disease) ([place])   . Diabetes mellitus without complication ([place])   . Elevated LFTs   . Emphysema of lung ([place])   . GERD (gastroesophageal reflux disease)   . History of adenomatous polyps of colon 10/15/2013  . Hyperlipemia   . Hypertension   . UTI (lower urinary tract infection)     Past Surgical History:  Procedure Laterality Date  . TONSILLECTOMY    . TONSILLECTOMY AND ADENOIDECTOMY    . TUBAL LIGATION      Current Outpatient Medications on File Prior to Visit  Medication Sig Dispense Refill  . glimepiride (AMARYL) 4 MG tablet Take 1 tablet by mouth every morning.    . Insulin Syringe-Needle U-100 (BD VEO INSULIN SYRINGE U/F) 31G X 15/64" 0.5 ML MISC USE AS DIRECTED TWICE DAILY WITH INSULIN    . metFORMIN (GLUCOPHAGE-XR) 500 MG 24 hr tablet TAKE ONE TABLET BY MOUTH WITH BREAKFAST AND ONE WITH  SUPPER FOR  DIABETES    . NOVOLIN 70/30 (70-30) 100 UNIT/ML injection Inject 35 units with breakfast and dinner  0  . lithium carbonate (ESKALITH) 450 MG CR tablet Take 450 mg by mouth 2 (two) times daily.     No current facility-administered medications on file prior to visit.     Allergies  Allergen Reactions  . Metformin And Related Nausea And Vomiting    Family History  Problem Relation Age of Onset  . Colon cancer Neg Hx   . Stomach cancer Neg Hx   . Esophageal cancer Neg Hx   . Rectal cancer Neg Hx     Social History   Socioeconomic History  . Marital status: Single    Spouse name: Not on file  . Number of children: 4  . Years of education: Not on file  . Highest education level: Not on file  Social Needs  . Financial resource strain: Not on file  . Food insecurity - worry: Not on file  . Food insecurity - inability: Not on file  . Transportation needs - medical: Not on file  . Transportation needs - non-medical: Not on file  Occupational History  . Occupation: Retired   Tobacco Use  . Smoking status: Current Some Day Smoker    Packs/day: 1.00    Types: Cigarettes  . Smokeless tobacco: Former Systems developer    Types: Chew  Substance and Sexual Activity  . Alcohol use: Yes    Comment: beer  . Drug use: No  .  Sexual activity: Not on file  Other Topics Concern  . Not on file  Social History Narrative   Daily caffeine    Review of Systems  Constitutional: Negative for fever and weight loss.  HENT: Negative for ear discharge, ear pain, hearing loss and tinnitus.   Eyes: Negative for blurred vision, double vision, photophobia and pain.  Respiratory: Negative for cough and shortness of breath.   Cardiovascular: Negative for chest pain and palpitations.  Gastrointestinal: Negative for abdominal pain, blood in stool, constipation, diarrhea, heartburn, melena, nausea and vomiting.  Genitourinary: Negative for dysuria, flank pain, frequency, hematuria and urgency.    Musculoskeletal: Negative for falls.  Neurological: Negative for dizziness, loss of consciousness and headaches.  Endo/Heme/Allergies: Negative for environmental allergies.  Psychiatric/Behavioral: Negative for depression, hallucinations, substance abuse and suicidal ideas. The patient is not nervous/anxious and does not have insomnia.    BP 98/70   Pulse 84   Temp 98.6 F (37 C) (Oral)   Resp 14   Ht '5\' 3"'$  (1.6 m)   Wt 136 lb (61.7 kg)   SpO2 98%   BMI 24.09 kg/m   Physical Exam  Constitutional: She is oriented to person, place, and time and well-developed, well-nourished, and in no distress.  HENT:  Head: Normocephalic and atraumatic.  Eyes: Conjunctivae are normal.  Neck: Neck supple.  Cardiovascular: Normal rate, regular rhythm, normal heart sounds and intact distal pulses.  Pulmonary/Chest: Effort normal and breath sounds normal. No respiratory distress. She has no wheezes. She has no rales. She exhibits no tenderness.  Neurological: She is alert and oriented to person, place, and time. No cranial nerve deficit.  Skin: Skin is warm and dry. No rash noted.  Psychiatric: Memory and affect normal.  Vitals reviewed.   Recent Results (from the past 2160 hour(s))  CBC w/Diff     Status: Abnormal   Collection Time: 12/21/16 11:34 AM  Result Value Ref Range   WBC 7.4 4.0 - 10.5 K/uL   RBC 5.53 (H) 3.87 - 5.11 Mil/uL   Hemoglobin 14.7 12.0 - 15.0 g/dL   HCT 46.1 (H) 36.0 - 46.0 %   MCV 83.4 78.0 - 100.0 fl   MCHC 32.0 30.0 - 36.0 g/dL   RDW 13.4 11.5 - 15.5 %   Platelets 184.0 150.0 - 400.0 K/uL   Neutrophils Relative % 65.8 43.0 - 77.0 %   Lymphocytes Relative 25.9 12.0 - 46.0 %   Monocytes Relative 6.9 3.0 - 12.0 %   Eosinophils Relative 0.7 0.0 - 5.0 %   Basophils Relative 0.7 0.0 - 3.0 %   Neutro Abs 4.9 1.4 - 7.7 K/uL   Lymphs Abs 1.9 0.7 - 4.0 K/uL   Monocytes Absolute 0.5 0.1 - 1.0 K/uL   Eosinophils Absolute 0.1 0.0 - 0.7 K/uL   Basophils Absolute 0.1 0.0 -  0.1 K/uL  Comp Met (CMET)     Status: Abnormal   Collection Time: 12/21/16 11:34 AM  Result Value Ref Range   Sodium 134 (L) 135 - 145 mEq/L   Potassium 4.9 3.5 - 5.1 mEq/L   Chloride 96 96 - 112 mEq/L   CO2 30 19 - 32 mEq/L   Glucose, Bld 265 (H) 70 - 99 mg/dL   BUN 12 6 - 23 mg/dL   Creatinine, Ser 0.80 0.40 - 1.20 mg/dL   Total Bilirubin 0.4 0.2 - 1.2 mg/dL   Alkaline Phosphatase 86 39 - 117 U/L   AST 17 0 - 37 U/L   ALT  18 0 - 35 U/L   Total Protein 7.1 6.0 - 8.3 g/dL   Albumin 4.4 3.5 - 5.2 g/dL   Calcium 9.9 8.4 - 10.5 mg/dL   GFR 92.24 >60.00 mL/min  [id number] 11+Oxyco+Alc+Crt-Bund     Status: None   Collection Time: 12/21/16 11:40 AM  Result Value Ref Range   Ethanol Negative Cutoff=0.020 %   Amphetamines, Urine Negative Cutoff=1000 ng/mL    Comment: Amphetamine test includes Amphetamine and Methamphetamine.   Barbiturate Negative Cutoff=200 ng/mL   BENZODIAZ UR QL Negative Cutoff=200 ng/mL   Cannabinoid Quant, Ur Negative Cutoff=50 ng/mL   Cocaine (Metabolite) Negative Cutoff=300 ng/mL   OPIATE SCREEN URINE Negative Cutoff=300 ng/mL    Comment: Opiate test includes Codeine, Morphine, Hydromorphone, Hydrocodone.   OXYCODONE+OXYMORPHONE UR QL SCN Negative Cutoff=300 ng/mL    Comment: Test includes Oxycodone and Oxymorphone   Phencyclidine Negative Cutoff=25 ng/mL   Methadone Screen, Urine Negative Cutoff=300 ng/mL   Propoxyphene Negative Cutoff=300 ng/mL   Meperidine Negative Cutoff=200 ng/mL    Comment: This test was developed and its performance characteristics determined by [facility]. It has not been cleared or approved by the Food and Drug Administration.    Tramadol Negative Cutoff=200 ng/mL   Creatinine 76.4 20.0 - 300.0 mg/dL   PH OF URINE 5.5 4.5 - 8.9  Hemoglobin A1c     Status: Abnormal   Collection Time: 12/28/16 11:13 AM  Result Value Ref Range   Hgb A1c MFr Bld 15.1 (H) 4.6 - 6.5 %    Comment: Glycemic Control Guidelines for People with Diabetes:Non  Diabetic:  <6%Goal of Therapy: <7%Additional Action Suggested:  >8%   Lipid panel     Status: Abnormal   Collection Time: 12/28/16 11:13 AM  Result Value Ref Range   Cholesterol 337 (H) 0 - 200 mg/dL    Comment: ATP III Classification       Desirable:  < 200 mg/dL               Borderline High:  200 - 239 mg/dL          High:  > = 240 mg/dL   Triglycerides 199.0 (H) 0.0 - 149.0 mg/dL    Comment: Normal:  <150 mg/dLBorderline High:  150 - 199 mg/dL   HDL 42.80 >39.00 mg/dL   VLDL 39.8 0.0 - 40.0 mg/dL   LDL Cholesterol 254 (H) 0 - 99 mg/dL   Total CHOL/HDL Ratio 8     Comment:                Men          Women1/2 Average Risk     3.4          3.3Average Risk          5.0          4.42X Average Risk          9.6          7.13X Average Risk          15.0          11.0                       NonHDL 294.02     Comment: NOTE:  Non-HDL goal should be 30 mg/dL higher than patient's LDL goal (i.e. LDL goal of < 70 mg/dL, would have non-HDL goal of < 100 mg/dL)  Urine Microalbumin w/creat. ratio     Status: None  Collection Time: 12/28/16 11:13 AM  Result Value Ref Range   Microalb, Ur 1.9 0.0 - 1.9 mg/dL   Creatinine,U 48.5 mg/dL   Microalb Creat Ratio 4.0 0.0 - 30.0 mg/g  Urinalysis, Routine w reflex microscopic     Status: Abnormal   Collection Time: 12/28/16 11:13 AM  Result Value Ref Range   Color, Urine YELLOW Yellow;Lt. Yellow   APPearance CLEAR Clear   Specific Gravity, Urine 1.010 1.000 - 1.030   pH 6.5 5.0 - 8.0   Total Protein, Urine NEGATIVE Negative   Urine Glucose >=1000 (A) Negative   Ketones, ur 15 (A) Negative   Bilirubin Urine NEGATIVE Negative   Hgb urine dipstick MODERATE (A) Negative   Urobilinogen, UA 0.2 0.0 - 1.0   Leukocytes, UA NEGATIVE Negative   Nitrite NEGATIVE Negative   WBC, UA 21-50/hpf (A) 0-2/hpf   RBC / HPF 7-10/hpf (A) 0-2/hpf   Squamous Epithelial / LPF Rare(0-4/hpf) Rare(0-4/hpf)   Bacteria, UA Many(>50/hpf) (A) None    Assessment/Plan: 1.  Bipolar 1 disorder, depressed ([place]) Will update labs today. Will check UDS panel giving history noted in chart. MMSE performed with score of 28/30. Patient appropriate with normal affect and mood. Answers questions appropriately. Recommended she follow-up with [facility]. She agrees to make an appointment. Will set her up with Dr. Si Raider for neuropsychiatric testing giving family concerns of memory. - CBC w/Diff - Comp Met (CMET) - [id number] 11+Oxyco+Alc+Crt-Bund  2. Essential hypertension Asymptomatic. CMP today.   3. Type 2 diabetes mellitus without complication, with long-term current use of insulin ([place]) Followed by Endocrinology. Will set up Endo evaluation here in the area. Patient to take medications as directed by current specialist. Foot exam updated today. No concerning findings. Patient due for A1C in a couple of weeks. Will check at that time pending she has not seen new specialist.   4. Age related osteoporosis, unspecified pathological fracture presence No current treatment. Repeat DEXA scan.     [name], PA-C

## 2016-12-31 ENCOUNTER — Other Ambulatory Visit: Payer: Self-pay | Admitting: Physician Assistant

## 2016-12-31 DIAGNOSIS — E785 Hyperlipidemia, unspecified: Secondary | ICD-10-CM

## 2016-12-31 LAB — URINE CULTURE
MICRO NUMBER: 81439698
SPECIMEN QUALITY:: ADEQUATE

## 2017-01-02 ENCOUNTER — Telehealth: Payer: Self-pay | Admitting: Physician Assistant

## 2017-01-02 ENCOUNTER — Other Ambulatory Visit: Payer: Self-pay | Admitting: Physician Assistant

## 2017-01-02 ENCOUNTER — Other Ambulatory Visit: Payer: Self-pay | Admitting: Emergency Medicine

## 2017-01-02 DIAGNOSIS — N390 Urinary tract infection, site not specified: Secondary | ICD-10-CM

## 2017-01-02 DIAGNOSIS — Z780 Asymptomatic menopausal state: Secondary | ICD-10-CM

## 2017-01-02 DIAGNOSIS — E2839 Other primary ovarian failure: Secondary | ICD-10-CM

## 2017-01-02 MED ORDER — CEPHALEXIN 500 MG PO CAPS: 500.0000 mg | ORAL_CAPSULE | Freq: Two times a day (BID) | ORAL | 0 refills | Status: AC

## 2017-01-02 NOTE — Telephone Encounter (Signed)
OK for PEC to disclose information below:

## 2017-01-02 NOTE — Telephone Encounter (Signed)
Called Kathryn Mcguire back to advise that Kathryn Mcguire is out of the office until next week. In addition, our providers do not have admitting privileges. She will need to contact her endocrinologist for further instruction or take her mother to the ER for evaluation.

## 2017-01-02 NOTE — Telephone Encounter (Signed)
error 

## 2017-01-02 NOTE — Telephone Encounter (Signed)
Pt's daughter, Kathryn Mcguire has called because she is concerned over her mother's decisions that she is making. She states her mother has demential and she is not doing what she needs to do to stay healthy.  She is drinking and smoking. Her family living with her  has respiratory problems already.  She will not take her medications. She is not checking her blood sugars.  Her daughter states she is not competent. She cooks and leaves the food on the stove burning. She is incontinent of stool and urine. Will not take a bath.. Pt's daughter, Kathryn Mcguire wants to know if her mom can be admitted to the hospital to get her blood sugars down so she can be evaluated to see an intervention specialist. So she can be properly taken care of. Kathryn Mcguire is requesting a call back please [phone].

## 2017-01-04 ENCOUNTER — Encounter: Payer: Self-pay | Admitting: Internal Medicine

## 2017-01-04 ENCOUNTER — Other Ambulatory Visit: Payer: Self-pay | Admitting: Emergency Medicine

## 2017-01-04 ENCOUNTER — Other Ambulatory Visit: Payer: Self-pay | Admitting: Physician Assistant

## 2017-01-04 ENCOUNTER — Encounter: Payer: Self-pay | Admitting: Psychology

## 2017-01-04 MED ORDER — NOVOLIN 70/30 (70-30) 100 UNIT/ML ~~LOC~~ SUSP: SUBCUTANEOUS | 3 refills | Status: DC

## 2017-01-04 MED ORDER — METFORMIN HCL ER 500 MG PO TB24: ORAL_TABLET | ORAL | 3 refills | Status: DC

## 2017-01-11 ENCOUNTER — Telehealth: Payer: Self-pay | Admitting: Physician Assistant

## 2017-01-11 NOTE — Telephone Encounter (Signed)
LM for daughter letting her know to have her mom placed on the cancellation list.

## 2017-01-11 NOTE — Telephone Encounter (Signed)
Copied from [place]. Topic: Quick Communication - See Telephone Encounter >> Jan 11, 2017  2:27 PM Robina Ade, Helene Kelp D wrote: CRM for notification. See Telephone encounter for: 01/11/17. Patient daughter Glenard Haring called a referral to neurology. Patient has an appt until April but she needs a sooner appt. Please call her back, thanks.

## 2017-01-11 NOTE — Telephone Encounter (Signed)
Would have them place her on a waiting list -- This is with Dr. Si Raider for cognitive assessment.

## 2017-01-11 NOTE — Telephone Encounter (Signed)
Referral was placed Routine, and Neurology told daughter first available was April.   Do we need to change this referral to Urgent and have Levada Dy call for sooner appointment?

## 2017-01-14 ENCOUNTER — Ambulatory Visit: Payer: Self-pay

## 2017-01-14 ENCOUNTER — Other Ambulatory Visit: Payer: Self-pay

## 2017-01-14 ENCOUNTER — Encounter (HOSPITAL_COMMUNITY): Payer: Self-pay | Admitting: Emergency Medicine

## 2017-01-14 ENCOUNTER — Emergency Department (HOSPITAL_COMMUNITY): Payer: Medicare HMO

## 2017-01-14 ENCOUNTER — Ambulatory Visit: Payer: Self-pay | Admitting: Physician Assistant

## 2017-01-14 ENCOUNTER — Emergency Department (HOSPITAL_COMMUNITY)
Admission: EM | Admit: 2017-01-14 | Discharge: 2017-01-15 | Disposition: A | Discharge status: Home / Self Care | Payer: Medicare HMO | Attending: Emergency Medicine | Admitting: Emergency Medicine

## 2017-01-14 DIAGNOSIS — Z794 Long term (current) use of insulin: Secondary | ICD-10-CM | POA: Diagnosis not present

## 2017-01-14 DIAGNOSIS — E785 Hyperlipidemia, unspecified: Secondary | ICD-10-CM | POA: Diagnosis not present

## 2017-01-14 DIAGNOSIS — F17210 Nicotine dependence, cigarettes, uncomplicated: Secondary | ICD-10-CM | POA: Insufficient documentation

## 2017-01-14 DIAGNOSIS — J449 Chronic obstructive pulmonary disease, unspecified: Secondary | ICD-10-CM | POA: Diagnosis not present

## 2017-01-14 DIAGNOSIS — Z79899 Other long term (current) drug therapy: Secondary | ICD-10-CM | POA: Diagnosis not present

## 2017-01-14 DIAGNOSIS — E569 Vitamin deficiency, unspecified: Secondary | ICD-10-CM | POA: Insufficient documentation

## 2017-01-14 DIAGNOSIS — M545 Low back pain: Secondary | ICD-10-CM | POA: Diagnosis not present

## 2017-01-14 DIAGNOSIS — E119 Type 2 diabetes mellitus without complications: Secondary | ICD-10-CM | POA: Insufficient documentation

## 2017-01-14 DIAGNOSIS — N390 Urinary tract infection, site not specified: Secondary | ICD-10-CM | POA: Diagnosis not present

## 2017-01-14 DIAGNOSIS — R509 Fever, unspecified: Secondary | ICD-10-CM | POA: Diagnosis not present

## 2017-01-14 DIAGNOSIS — I10 Essential (primary) hypertension: Secondary | ICD-10-CM | POA: Diagnosis not present

## 2017-01-14 LAB — PROTIME-INR
INR: 1.01
PROTHROMBIN TIME: 13.2 s (ref 11.4–15.2)

## 2017-01-14 LAB — COMPREHENSIVE METABOLIC PANEL
ALBUMIN: 3.7 g/dL (ref 3.5–5.0)
ALK PHOS: 91 U/L (ref 38–126)
ALT: 15 U/L (ref 14–54)
AST: 14 U/L — ABNORMAL LOW (ref 15–41)
Anion gap: 15 (ref 5–15)
BILIRUBIN TOTAL: 1.1 mg/dL (ref 0.3–1.2)
BUN: 20 mg/dL (ref 6–20)
CALCIUM: 10 mg/dL (ref 8.9–10.3)
CO2: 19 mmol/L — ABNORMAL LOW (ref 22–32)
CREATININE: 1.16 mg/dL — AB (ref 0.44–1.00)
Chloride: 99 mmol/L — ABNORMAL LOW (ref 101–111)
GFR calc Af Amer: 56 mL/min — ABNORMAL LOW (ref >60)
GFR calc non Af Amer: 48 mL/min — ABNORMAL LOW (ref >60)
Glucose, Bld: 358 mg/dL — ABNORMAL HIGH (ref 65–99)
Potassium: 4.7 mmol/L (ref 3.5–5.1)
SODIUM: 133 mmol/L — AB (ref 135–145)
TOTAL PROTEIN: 7.9 g/dL (ref 6.5–8.1)

## 2017-01-14 LAB — CBC WITH DIFFERENTIAL/PLATELET
BASOS PCT: 0 %
Basophils Absolute: 0 10*3/uL (ref 0.0–0.1)
EOS ABS: 0 10*3/uL (ref 0.0–0.7)
EOS PCT: 0 %
HCT: 46.8 % — ABNORMAL HIGH (ref 36.0–46.0)
Hemoglobin: 14.9 g/dL (ref 12.0–15.0)
Lymphocytes Relative: 10 %
Lymphs Abs: 1.2 10*3/uL (ref 0.7–4.0)
MCH: 26.8 pg (ref 26.0–34.0)
MCHC: 31.8 g/dL (ref 30.0–36.0)
MCV: 84.2 fL (ref 78.0–100.0)
MONO ABS: 1.1 10*3/uL — AB (ref 0.1–1.0)
MONOS PCT: 10 %
Neutro Abs: 9.5 10*3/uL — ABNORMAL HIGH (ref 1.7–7.7)
Neutrophils Relative %: 80 %
PLATELETS: 189 10*3/uL (ref 150–400)
RBC: 5.56 MIL/uL — ABNORMAL HIGH (ref 3.87–5.11)
RDW: 13.9 % (ref 11.5–15.5)
WBC: 11.9 10*3/uL — ABNORMAL HIGH (ref 4.0–10.5)

## 2017-01-14 LAB — URINALYSIS, ROUTINE W REFLEX MICROSCOPIC
BILIRUBIN URINE: NEGATIVE
Glucose, UA: >=500 mg/dL — AB
Ketones, ur: 80 mg/dL — AB
Nitrite: NEGATIVE
Protein, ur: 30 mg/dL — AB
Specific Gravity, Urine: 1.025 (ref 1.005–1.030)
pH: 5 (ref 5.0–8.0)

## 2017-01-14 LAB — I-STAT CG4 LACTIC ACID, ED: Lactic Acid, Venous: 1.53 mmol/L (ref 0.5–1.9)

## 2017-01-14 LAB — CBG MONITORING, ED: GLUCOSE-CAPILLARY: 363 mg/dL — AB (ref 65–99)

## 2017-01-14 MED ORDER — IBUPROFEN 800 MG PO TABS
800.0000 mg | ORAL_TABLET | Freq: Once | ORAL | Status: AC
  Administered 2017-01-15: 800 mg via ORAL
  Filled 2017-01-14: qty 1

## 2017-01-14 MED ORDER — ACETAMINOPHEN 325 MG PO TABS
650.0000 mg | ORAL_TABLET | Freq: Once | ORAL | Status: AC
  Administered 2017-01-14: 650 mg via ORAL
  Filled 2017-01-14: qty 2

## 2017-01-14 MED ORDER — CEFTRIAXONE SODIUM 1 G IJ SOLR
1.0000 g | Freq: Once | INTRAMUSCULAR | Status: AC
  Administered 2017-01-15: 1 g via INTRAMUSCULAR
  Filled 2017-01-14: qty 10

## 2017-01-14 NOTE — Telephone Encounter (Signed)
Kathryn Mcguire, please call patient.   My advice would be for patient to be seen in ER today to r/o concern for a kidney infection as these are very severe.   We can discuss change in mentation at office visit but as previously discussed patient denies all of these accusations regarding memory, she is always appropriate at visits and is well-kempt. Also patient's daughter will never come into the room to discuss these issues, nor will other members of her family as previously requested. I cannot take their word over hers unless there is a discussion in the office visit. They need to be present for these visits.

## 2017-01-14 NOTE — Telephone Encounter (Signed)
Spoke with daughter, Glenard Haring.  Daughter states patient does not tell the truth about her health during office visits and is not sure what her options are at this point. Encouraged daughter to accompany patient through [place] with PCP, daughter agrees.  Spoke with patient, advised her to go to the Emergency Room for evaluation and treatment of kidney infection. Patient verbalizes understanding and agrees to plan.  Patient scheduled for f/u with PCP on 01/17/2017.

## 2017-01-14 NOTE — Telephone Encounter (Signed)
Daughter called to report multiple behavioral issues.  Reported pt. has Dementia.  Stated over the past 3 years there have been increasing behavioral changes, and that this has escalated over the past year.  Reported the pt. has been drinking alcohol and chain smoking.  Reported mood swings, inappropriate sexual behavior, and substance abuse, including alcohol and marijuana.   Reported the pt. is refusing to take her medications.  Stated that her glucose runs  400-500.  Reported the pt. won't bathe; stated "my father had to make her bathe today, because she had an odor."  Reported she tries to cook, and walks away, and then goes to lay down, and the food burns. Stated she has been trying to get help for her since June.  Stated "she has an appt. with Neurology in April, but I don't think she can wait until then; something is not right."  Reported "I feel like my mother is in crisis."  Also, stated her father reported the patient has been holding her left back, in the kidney area, and noticed her urine is brown.  Scheduled a 30 min. appt. with PCP tomorrow, for eval. of UTI and to discuss other behavioral concerns.  Daughter stated she cannot be at the appt. Tomorrow, but if the provider needs to talk with her, she can be available to take a call. Advised will send message to the provider.

## 2017-01-14 NOTE — ED Provider Notes (Signed)
[facility] EMERGENCY DEPARTMENT Provider Note   CSN: [id number] Arrival date & time: 01/14/17  1638     History   Chief Complaint Chief Complaint  Patient presents with  . Back Pain  . Fever    HPI Kathryn Mcguire is a 67 y.o. female.  The history is provided by the patient.  Fever   This is a new problem. The current episode started yesterday. The problem occurs constantly. The problem has not changed since onset.Her temperature was unmeasured prior to arrival. Pertinent negatives include no chest pain, no fussiness, no sleepiness, no diarrhea, no vomiting, no congestion, no headaches, no sore throat, no tugging at ear, no muscle aches and no cough. Associated symptoms comments: Lower back pain and dark urine, recent UTI 3 weeks ago. She has tried nothing for the symptoms. The treatment provided no relief.    Past Medical History:  Diagnosis Date  . Arthritis   . Bipolar depression ([place])   . COPD (chronic obstructive pulmonary disease) ([place])   . Diabetes mellitus without complication ([place])   . Elevated LFTs   . Emphysema of lung ([place])   . GERD (gastroesophageal reflux disease)   . History of adenomatous polyps of colon 10/15/2013  . Hyperlipemia   . Hypertension   . UTI (lower urinary tract infection)     Patient Active Problem List   Diagnosis Date Noted  . Breast cancer screening 12/30/2016  . Hypertension 12/21/2016  . Type 2 diabetes mellitus without complications ([place]) 44/03/4740  . Patient's other noncompliance with medication regimen 06/08/2014  . Bipolar 1 disorder, depressed ([place]) 01/18/2014  . Hyperglycemia   . Noncompliance with medication regimen   . Psychiatric illness   . Long-term insulin use ([place]) 01/07/2014  . Vitamin D deficiency 01/06/2014  . History of adenomatous polyps of colon 10/15/2013  . Abnormal LFTs 08/25/2013  . Transaminitis 06/17/2013  . Depression 06/15/2013  . Severe bipolar I disorder, most recent episode  mixed, with psychotic features, mood-incongruent ([place]) 06/15/2013  . Dyslipidemia 04/20/2013  . GERD (gastroesophageal reflux disease) 04/20/2013  . Osteoporosis 04/20/2013  . COPD (chronic obstructive pulmonary disease) ([place]) 06/16/2012  . S/P colonoscopic polypectomy 06/16/2012    Past Surgical History:  Procedure Laterality Date  . TONSILLECTOMY    . TONSILLECTOMY AND ADENOIDECTOMY    . TUBAL LIGATION      OB History    No data available       Home Medications    Prior to Admission medications   Medication Sig Start Date End Date Taking? Authorizing Provider  atorvastatin (LIPITOR) 40 MG tablet Take 1 tablet (40 mg total) by mouth daily. 12/28/16   Brunetta Jeans, PA-C  glimepiride (AMARYL) 4 MG tablet Take 1 tablet by mouth every morning. 09/23/16   [provider]  Insulin Syringe-Needle U-100 (BD VEO INSULIN SYRINGE U/F) 31G X 15/64" 0.5 ML MISC USE AS DIRECTED TWICE DAILY WITH INSULIN 09/23/16   [provider]  lithium carbonate (ESKALITH) 450 MG CR tablet Take 450 mg by mouth 2 (two) times daily.    [provider]  metFORMIN (GLUCOPHAGE-XR) 500 MG 24 hr tablet TAKE ONE TABLET BY MOUTH WITH BREAKFAST AND ONE WITH SUPPER FOR  DIABETES 01/04/17   Brunetta Jeans, PA-C  NOVOLIN 70/30 (70-30) 100 UNIT/ML injection Inject 35 units with breakfast and dinner 01/04/17   Brunetta Jeans, PA-C    Family History Family History  Problem Relation Age of Onset  . Colon cancer  Neg Hx   . Stomach cancer Neg Hx   . Esophageal cancer Neg Hx   . Rectal cancer Neg Hx     Social History Social History   Tobacco Use  . Smoking status: Current Some Day Smoker    Packs/day: 1.00    Types: Cigarettes  . Smokeless tobacco: Former Systems developer    Types: Chew  Substance Use Topics  . Alcohol use: Yes    Comment: beer  . Drug use: No     Allergies   Metformin and related   Review of Systems Review of Systems  Constitutional: Positive for fever.    HENT: Negative for congestion and sore throat.   Respiratory: Negative for cough.   Cardiovascular: Negative for chest pain.  Gastrointestinal: Negative for diarrhea and vomiting.  Genitourinary: Negative for dysuria, flank pain and hematuria.  Neurological: Negative for headaches.  All other systems reviewed and are negative.    Physical Exam Updated Vital Signs BP 124/82 (BP Location: Left Arm)   Pulse 96   Temp 99.1 F (37.3 C) (Oral)   Resp 18   Ht 5\' 3"  (1.6 m)   Wt 61.7 kg (136 lb)   SpO2 97%   BMI 24.09 kg/m   Physical Exam  Constitutional: She is oriented to person, place, and time. She appears well-developed and well-nourished. No distress.  HENT:  Head: Normocephalic and atraumatic.  Mouth/Throat: No oropharyngeal exudate.  Eyes: Conjunctivae are normal. Pupils are equal, round, and reactive to light.  Neck: Normal range of motion. Neck supple.  Cardiovascular: Normal rate, regular rhythm, normal heart sounds and intact distal pulses.  Pulmonary/Chest: Effort normal and breath sounds normal. No stridor. She has no wheezes. She has no rales.  Abdominal: Soft. Bowel sounds are normal. She exhibits no mass. There is no tenderness. There is no rebound and no guarding.  Musculoskeletal: Normal range of motion.  Neurological: She is alert and oriented to person, place, and time. She displays normal reflexes.  Skin: Skin is warm and dry. Capillary refill takes less than 2 seconds.  Psychiatric: She has a normal mood and affect.     ED Treatments / Results  Labs (all labs ordered are listed, but only abnormal results are displayed)  Results for orders placed or performed during the hospital encounter of 01/14/17  Comprehensive metabolic panel  Result Value Ref Range   Sodium 133 (L) 135 - 145 mmol/L   Potassium 4.7 3.5 - 5.1 mmol/L   Chloride 99 (L) 101 - 111 mmol/L   CO2 19 (L) 22 - 32 mmol/L   Glucose, Bld 358 (H) 65 - 99 mg/dL   BUN 20 6 - 20 mg/dL    Creatinine, Ser 1.16 (H) 0.44 - 1.00 mg/dL   Calcium 10.0 8.9 - 10.3 mg/dL   Total Protein 7.9 6.5 - 8.1 g/dL   Albumin 3.7 3.5 - 5.0 g/dL   AST 14 (L) 15 - 41 U/L   ALT 15 14 - 54 U/L   Alkaline Phosphatase 91 38 - 126 U/L   Total Bilirubin 1.1 0.3 - 1.2 mg/dL   GFR calc non Af Amer 48 (L) >60 mL/min   GFR calc Af Amer 56 (L) >60 mL/min   Anion gap 15 5 - 15  CBC with Differential  Result Value Ref Range   WBC 11.9 (H) 4.0 - 10.5 K/uL   RBC 5.56 (H) 3.87 - 5.11 MIL/uL   Hemoglobin 14.9 12.0 - 15.0 g/dL   HCT 46.8 (H) 36.0 -  46.0 %   MCV 84.2 78.0 - 100.0 fL   MCH 26.8 26.0 - 34.0 pg   MCHC 31.8 30.0 - 36.0 g/dL   RDW 13.9 11.5 - 15.5 %   Platelets 189 150 - 400 K/uL   Neutrophils Relative % 80 %   Neutro Abs 9.5 (H) 1.7 - 7.7 K/uL   Lymphocytes Relative 10 %   Lymphs Abs 1.2 0.7 - 4.0 K/uL   Monocytes Relative 10 %   Monocytes Absolute 1.1 (H) 0.1 - 1.0 K/uL   Eosinophils Relative 0 %   Eosinophils Absolute 0.0 0.0 - 0.7 K/uL   Basophils Relative 0 %   Basophils Absolute 0.0 0.0 - 0.1 K/uL  Protime-INR  Result Value Ref Range   Prothrombin Time 13.2 11.4 - 15.2 seconds   INR 1.01   Urinalysis, Routine w reflex microscopic  Result Value Ref Range   Color, Urine YELLOW YELLOW   APPearance HAZY (A) CLEAR   Specific Gravity, Urine 1.025 1.005 - 1.030   pH 5.0 5.0 - 8.0   Glucose, UA >=500 (A) NEGATIVE mg/dL   Hgb urine dipstick MODERATE (A) NEGATIVE   Bilirubin Urine NEGATIVE NEGATIVE   Ketones, ur 80 (A) NEGATIVE mg/dL   Protein, ur 30 (A) NEGATIVE mg/dL   Nitrite NEGATIVE NEGATIVE   Leukocytes, UA TRACE (A) NEGATIVE   RBC / HPF 0-5 0 - 5 RBC/hpf   WBC, UA 6-30 0 - 5 WBC/hpf   Bacteria, UA RARE (A) NONE SEEN   Squamous Epithelial / LPF 0-5 (A) NONE SEEN   Mucus PRESENT   I-Stat CG4 Lactic Acid, ED  Result Value Ref Range   Lactic Acid, Venous 1.53 0.5 - 1.9 mmol/L  CBG monitoring, ED  Result Value Ref Range   Glucose-Capillary 363 (H) 65 - 99 mg/dL   Dg  Chest 2 View  Result Date: 01/14/2017 CLINICAL DATA:  Low back pain and fever. EXAM: CHEST  2 VIEW COMPARISON:  01/19/2014 FINDINGS: Cardiomediastinal silhouette is normal. Mediastinal contours appear intact. Moderate in size hiatal hernia. There is no evidence of focal airspace consolidation, pleural effusion or pneumothorax. Osseous structures are without acute abnormality. Soft tissues are grossly normal. IMPRESSION: No active cardiopulmonary disease. Moderate in size hiatal hernia. Electronically Signed   By: Fidela Salisbury M.D.   On: 01/14/2017 17:20     Radiology Dg Chest 2 View  Result Date: 01/14/2017 CLINICAL DATA:  Low back pain and fever. EXAM: CHEST  2 VIEW COMPARISON:  01/19/2014 FINDINGS: Cardiomediastinal silhouette is normal. Mediastinal contours appear intact. Moderate in size hiatal hernia. There is no evidence of focal airspace consolidation, pleural effusion or pneumothorax. Osseous structures are without acute abnormality. Soft tissues are grossly normal. IMPRESSION: No active cardiopulmonary disease. Moderate in size hiatal hernia. Electronically Signed   By: Fidela Salisbury M.D.   On: 01/14/2017 17:20    Procedures Procedures (including critical care time)  Medications Ordered in ED Medications  cefTRIAXone (ROCEPHIN) injection 1 g (not administered)  ibuprofen (ADVIL,MOTRIN) tablet 800 mg (not administered)  acetaminophen (TYLENOL) tablet 650 mg (650 mg Oral Given 01/14/17 1651)       Final Clinical Impressions(s) / ED Diagnoses   Return for worsening pain, fevers > 100.4 unrelieved by medication, shortness of breath, intractable vomiting, or diarrhea, abdominal pain, Inability to tolerate liquids or food, cough, altered mental status or any concerns. No signs of systemic illness or infection. The patient is nontoxic-appearing on exam and vital signs are within normal limits.  Follow  up with your PMD in 48 hours.  Take all antibiotics as directed.    I have  reviewed the triage vital signs and the nursing notes. Pertinent labs &imaging results that were available during my care of the patient were reviewed by me and considered in my medical decision making (see chart for details).  After history, exam, and medical workup I feel the patient has been appropriately medically screened and is safe for discharge home. Pertinent diagnoses were discussed with the patient. Patient was given return precautions.    Karenann Mcgrory, MD 01/14/17 2354

## 2017-01-15 ENCOUNTER — Ambulatory Visit: Payer: Self-pay | Admitting: Physician Assistant

## 2017-01-15 MED ORDER — STERILE WATER FOR INJECTION IJ SOLN
INTRAMUSCULAR | Status: AC
  Administered 2017-01-15: 10 mL
  Filled 2017-01-15: qty 10

## 2017-01-15 MED ORDER — CEPHALEXIN 500 MG PO CAPS: 500.0000 mg | ORAL_CAPSULE | Freq: Four times a day (QID) | ORAL | 0 refills | Status: DC

## 2017-01-15 NOTE — ED Notes (Signed)
Pt given graham crackers and coke to drink

## 2017-01-17 ENCOUNTER — Ambulatory Visit (INDEPENDENT_AMBULATORY_CARE_PROVIDER_SITE_OTHER): Payer: Medicare HMO | Admitting: Physician Assistant

## 2017-01-17 ENCOUNTER — Encounter: Payer: Self-pay | Admitting: Physician Assistant

## 2017-01-17 VITALS — BP 98/60 | HR 111 | Temp 98.4°F | Resp 14 | Ht 63.0 in | Wt 127.0 lb

## 2017-01-17 DIAGNOSIS — Z9114 Patient's other noncompliance with medication regimen: Secondary | ICD-10-CM | POA: Diagnosis not present

## 2017-01-17 DIAGNOSIS — N3000 Acute cystitis without hematuria: Secondary | ICD-10-CM

## 2017-01-17 DIAGNOSIS — F319 Bipolar disorder, unspecified: Secondary | ICD-10-CM | POA: Diagnosis not present

## 2017-01-17 DIAGNOSIS — E119 Type 2 diabetes mellitus without complications: Secondary | ICD-10-CM

## 2017-01-17 DIAGNOSIS — Z794 Long term (current) use of insulin: Secondary | ICD-10-CM | POA: Diagnosis not present

## 2017-01-17 LAB — TSH: TSH: 0.5 u[IU]/mL (ref 0.35–4.50)

## 2017-01-17 NOTE — Assessment & Plan Note (Signed)
Asymptomatic. Finish entire antibiotic course. ER culture pending. Follow-up 1 week after completion. Will recheck urine at that time.

## 2017-01-17 NOTE — Assessment & Plan Note (Signed)
Followed by Beverly Sessions. Question patient's honesty with medication compliance. Is now saying she is taking Lithium 450 mg BID. Will check lithium level and TSH today. Hygiene is impaired so concern for worsening mood despite patient's denial of this. Non-labile. No pressured speech. Poor insight into her health.  Number to [facility] given to patient and daughter so follow-up can be scheduled.

## 2017-01-17 NOTE — Patient Instructions (Addendum)
Please go to the lab today for blood work.  I will call you with your results. We will alter treatment regimen(s) if indicated by your results.   Please call [facility] at [phone] to schedule a follow-up appointment with your Psychiatrist.  You need regular follow-up for Bipolar disorder giving you have now restarted Lithium.  I am checking some monitoring labs today.  Your A1C and what your family is noting about full insulin bottles at home convinces me you are not taking insulin as directed. It is very important that we get your glucose level under control to prevent concern for stroke and heart attack.  Take 35 units of the insulin twice daily. Needs to check sugars at least twice daily -- AM (fasting) and evening (non-fasting). Keep a consistent diet. If you have not heard from the Endocrinologist, by next week please let me know.   Follow-up with me in 1 week after completion of your antibiotic so we can reassess urine. Bring a copy of your sugar levels to that appointment.

## 2017-01-17 NOTE — Assessment & Plan Note (Signed)
Non-adherent with medication. Endo appointment scheduled for 1 week from now. Patient to take insulin as directed and check CBGs at least twice daily. Bring to follow-up.

## 2017-01-17 NOTE — Progress Notes (Signed)
Patient presents to clinic today for ER follow-up. Patient presented to ER on 01/14/17 with complaints of dysuria, fever, and dark urine. Was diagnosed with cystitis, given Ceftriaxone IM in the ER and transitioned to oral Keflex QID on discharge. Endorses taking medications as directed without side effect. Notes resolution of dysuria, foul-smelling urine and frequency. Denies fever, chills, nausea or vomiting. Notes fasting sugar this morning was mid 200s. Endorses taking her Novolin 35 units BID as directed. Discussed that her very recent A1C was at 15.1 so it is very unlikely she is taking medication as directed. Daughter is present for visit today and states that her mother has two full bottles of insulin at home that were prescribed several months ago. Patient is only checking CBG on very rare occasion. States she does not know why she is not checking more often. Also endorses taking her Lithium 450 mg BID as directed by Psychiatry when at last visits she states they took her off of the medication months ago. Cannot give a reason for change. Denies depressed mood or anhedonia. Denies manic episodes. Notes sleeping well and no use of illicit drugs which daughter disagrees with stating she drinks daily.    Past Medical History:  Diagnosis Date  . Arthritis   . Bipolar depression ([name])   . COPD (chronic obstructive pulmonary disease) ([place])   . Diabetes mellitus without complication ([place])   . Elevated LFTs   . Emphysema of lung ([place])   . GERD (gastroesophageal reflux disease)   . History of adenomatous polyps of colon 10/15/2013  . Hyperlipemia   . Hypertension   . UTI (lower urinary tract infection)     Current Outpatient Medications on File Prior to Visit  Medication Sig Dispense Refill  . atorvastatin (LIPITOR) 40 MG tablet Take 1 tablet (40 mg total) by mouth daily. 30 tablet 3  . cephALEXin (KEFLEX) 500 MG capsule Take 1 capsule (500 mg total) by mouth 4 (four) times daily. 28 capsule 0    . glimepiride (AMARYL) 4 MG tablet Take 1 tablet by mouth every morning.    . Insulin Syringe-Needle U-100 (BD VEO INSULIN SYRINGE U/F) 31G X 15/64" 0.5 ML MISC USE AS DIRECTED TWICE DAILY WITH INSULIN    . lithium carbonate (ESKALITH) 450 MG CR tablet Take 450 mg by mouth 2 (two) times daily.    . metFORMIN (GLUCOPHAGE-XR) 500 MG 24 hr tablet TAKE ONE TABLET BY MOUTH WITH BREAKFAST AND ONE WITH SUPPER FOR  DIABETES 60 tablet 3  . NOVOLIN 70/30 (70-30) 100 UNIT/ML injection Inject 35 units with breakfast and dinner 10 mL 3   No current facility-administered medications on file prior to visit.     Allergies  Allergen Reactions  . Metformin And Related Nausea And Vomiting    Family History  Problem Relation Age of Onset  . Colon cancer Neg Hx   . Stomach cancer Neg Hx   . Esophageal cancer Neg Hx   . Rectal cancer Neg Hx     Social History   Socioeconomic History  . Marital status: Single    Spouse name: None  . Number of children: 4  . Years of education: None  . Highest education level: None  Social Needs  . Financial resource strain: None  . Food insecurity - worry: None  . Food insecurity - inability: None  . Transportation needs - medical: None  . Transportation needs - non-medical: None  Occupational History  . Occupation: Retired   Tobacco Use  .  Smoking status: Current Some Day Smoker    Packs/day: 1.00    Types: Cigarettes  . Smokeless tobacco: Former Systems developer    Types: Chew  Substance and Sexual Activity  . Alcohol use: Yes    Comment: beer  . Drug use: No  . Sexual activity: None  Other Topics Concern  . None  Social History Narrative   Daily caffeine    Review of Systems - See HPI.  All other ROS are negative.  BP 98/60   Pulse (!) 111   Temp 98.4 F (36.9 C) (Oral)   Resp 14   Ht '5\' 3"'$  (1.6 m)   Wt 127 lb (57.6 kg)   SpO2 97%   BMI 22.50 kg/m   Physical Exam  Constitutional: She is oriented to person, place, and time and well-developed,  well-nourished, and in no distress.  HENT:  Head: Normocephalic and atraumatic.  Eyes: Conjunctivae are normal.  Neck: Neck supple.  Cardiovascular: Normal rate, regular rhythm, normal heart sounds and intact distal pulses.  Pulmonary/Chest: Effort normal and breath sounds normal. No respiratory distress. She has no wheezes. She has no rales. She exhibits no tenderness.  Abdominal: Soft. Bowel sounds are normal. She exhibits no distension. There is no tenderness.  Negative CVA tenderness.  Neurological: She is alert and oriented to person, place, and time. No cranial nerve deficit.  Skin: Skin is warm and dry. No rash noted.  Vitals reviewed.  Recent Results (from the past 2160 hour(s))  CBC w/Diff     Status: Abnormal   Collection Time: 12/21/16 11:34 AM  Result Value Ref Range   WBC 7.4 4.0 - 10.5 K/uL   RBC 5.53 (H) 3.87 - 5.11 Mil/uL   Hemoglobin 14.7 12.0 - 15.0 g/dL   HCT 46.1 (H) 36.0 - 46.0 %   MCV 83.4 78.0 - 100.0 fl   MCHC 32.0 30.0 - 36.0 g/dL   RDW 13.4 11.5 - 15.5 %   Platelets 184.0 150.0 - 400.0 K/uL   Neutrophils Relative % 65.8 43.0 - 77.0 %   Lymphocytes Relative 25.9 12.0 - 46.0 %   Monocytes Relative 6.9 3.0 - 12.0 %   Eosinophils Relative 0.7 0.0 - 5.0 %   Basophils Relative 0.7 0.0 - 3.0 %   Neutro Abs 4.9 1.4 - 7.7 K/uL   Lymphs Abs 1.9 0.7 - 4.0 K/uL   Monocytes Absolute 0.5 0.1 - 1.0 K/uL   Eosinophils Absolute 0.1 0.0 - 0.7 K/uL   Basophils Absolute 0.1 0.0 - 0.1 K/uL  Comp Met (CMET)     Status: Abnormal   Collection Time: 12/21/16 11:34 AM  Result Value Ref Range   Sodium 134 (L) 135 - 145 mEq/L   Potassium 4.9 3.5 - 5.1 mEq/L   Chloride 96 96 - 112 mEq/L   CO2 30 19 - 32 mEq/L   Glucose, Bld 265 (H) 70 - 99 mg/dL   BUN 12 6 - 23 mg/dL   Creatinine, Ser 0.80 0.40 - 1.20 mg/dL   Total Bilirubin 0.4 0.2 - 1.2 mg/dL   Alkaline Phosphatase 86 39 - 117 U/L   AST 17 0 - 37 U/L   ALT 18 0 - 35 U/L   Total Protein 7.1 6.0 - 8.3 g/dL   Albumin 4.4  3.5 - 5.2 g/dL   Calcium 9.9 8.4 - 10.5 mg/dL   GFR 92.24 >60.00 mL/min  [id number] 11+Oxyco+Alc+Crt-Bund     Status: None   Collection Time: 12/21/16 11:40 AM  Result Value  Ref Range   Ethanol Negative Cutoff=0.020 %   Amphetamines, Urine Negative Cutoff=1000 ng/mL    Comment: Amphetamine test includes Amphetamine and Methamphetamine.   Barbiturate Negative Cutoff=200 ng/mL   BENZODIAZ UR QL Negative Cutoff=200 ng/mL   Cannabinoid Quant, Ur Negative Cutoff=50 ng/mL   Cocaine (Metabolite) Negative Cutoff=300 ng/mL   OPIATE SCREEN URINE Negative Cutoff=300 ng/mL    Comment: Opiate test includes Codeine, Morphine, Hydromorphone, Hydrocodone.   OXYCODONE+OXYMORPHONE UR QL SCN Negative Cutoff=300 ng/mL    Comment: Test includes Oxycodone and Oxymorphone   Phencyclidine Negative Cutoff=25 ng/mL   Methadone Screen, Urine Negative Cutoff=300 ng/mL   Propoxyphene Negative Cutoff=300 ng/mL   Meperidine Negative Cutoff=200 ng/mL    Comment: This test was developed and its performance characteristics determined by [facility]. It has not been cleared or approved by the Food and Drug Administration.    Tramadol Negative Cutoff=200 ng/mL   Creatinine 76.4 20.0 - 300.0 mg/dL   PH OF URINE 5.5 4.5 - 8.9  Hemoglobin A1c     Status: Abnormal   Collection Time: 12/28/16 11:13 AM  Result Value Ref Range   Hgb A1c MFr Bld 15.1 (H) 4.6 - 6.5 %    Comment: Glycemic Control Guidelines for People with Diabetes:Non Diabetic:  <6%Goal of Therapy: <7%Additional Action Suggested:  >8%   Lipid panel     Status: Abnormal   Collection Time: 12/28/16 11:13 AM  Result Value Ref Range   Cholesterol 337 (H) 0 - 200 mg/dL    Comment: ATP III Classification       Desirable:  < 200 mg/dL               Borderline High:  200 - 239 mg/dL          High:  > = 240 mg/dL   Triglycerides 199.0 (H) 0.0 - 149.0 mg/dL    Comment: Normal:  <150 mg/dLBorderline High:  150 - 199 mg/dL   HDL 42.80 >39.00 mg/dL   VLDL 39.8 0.0 - 40.0  mg/dL   LDL Cholesterol 254 (H) 0 - 99 mg/dL   Total CHOL/HDL Ratio 8     Comment:                Men          Women1/2 Average Risk     3.4          3.3Average Risk          5.0          4.42X Average Risk          9.6          7.13X Average Risk          15.0          11.0                       NonHDL 294.02     Comment: NOTE:  Non-HDL goal should be 30 mg/dL higher than patient's LDL goal (i.e. LDL goal of < 70 mg/dL, would have non-HDL goal of < 100 mg/dL)  Urine Microalbumin w/creat. ratio     Status: None   Collection Time: 12/28/16 11:13 AM  Result Value Ref Range   Microalb, Ur 1.9 0.0 - 1.9 mg/dL   Creatinine,U 48.5 mg/dL   Microalb Creat Ratio 4.0 0.0 - 30.0 mg/g  Urinalysis, Routine w reflex microscopic     Status: Abnormal   Collection Time: 12/28/16 11:13 AM  Result  Value Ref Range   Color, Urine YELLOW Yellow;Lt. Yellow   APPearance CLEAR Clear   Specific Gravity, Urine 1.010 1.000 - 1.030   pH 6.5 5.0 - 8.0   Total Protein, Urine NEGATIVE Negative   Urine Glucose >=1000 (A) Negative   Ketones, ur 15 (A) Negative   Bilirubin Urine NEGATIVE Negative   Hgb urine dipstick MODERATE (A) Negative   Urobilinogen, UA 0.2 0.0 - 1.0   Leukocytes, UA NEGATIVE Negative   Nitrite NEGATIVE Negative   WBC, UA 21-50/hpf (A) 0-2/hpf   RBC / HPF 7-10/hpf (A) 0-2/hpf   Squamous Epithelial / LPF Rare(0-4/hpf) Rare(0-4/hpf)   Bacteria, UA Many(>50/hpf) (A) None  Urine culture     Status: Abnormal   Collection Time: 12/28/16  5:18 PM  Result Value Ref Range   MICRO NUMBER: [id number]    SPECIMEN QUALITY: ADEQUATE    Sample Source URINE    STATUS: FINAL    ISOLATE 1: Escherichia coli (A)       Susceptibility   Escherichia coli - URINE CULTURE, REFLEX    AMOX/CLAVULANIC 4 Sensitive     AMPICILLIN  Resistant     AMPICILLIN/SULBACTAM 4 Sensitive     CEFAZOLIN* <=4 Not Reportable      * For infections other than uncomplicated UTIcaused by E. coli, K. pneumoniae or P. mirabilis:Cefazolin  is resistant if MIC > or = 8 mcg/mL.(Distinguishing susceptible versus intermediatefor isolates with MIC < or = 4 mcg/mL requiresadditional testing.)For uncomplicated UTI caused by E. coli,K. pneumoniae or P. mirabilis: Cefazolin issusceptible if MIC <32 mcg/mL and predictssusceptible to the oral agents cefaclor, cefdinir,cefpodoxime, cefprozil, cefuroxime, cephalexinand loracarbef.    CEFEPIME <=1 Sensitive     CEFTRIAXONE <=1 Sensitive     CIPROFLOXACIN <=0.25 Sensitive     LEVOFLOXACIN <=0.12 Sensitive     ERTAPENEM <=0.5 Sensitive     GENTAMICIN <=1 Sensitive     IMIPENEM <=0.25 Sensitive     NITROFURANTOIN <=16 Sensitive     PIP/TAZO <=4 Sensitive     TOBRAMYCIN <=1 Sensitive     TRIMETH/SULFA* <=20 Sensitive      * For infections other than uncomplicated UTIcaused by E. coli, K. pneumoniae or P. mirabilis:Cefazolin is resistant if MIC > or = 8 mcg/mL.(Distinguishing susceptible versus intermediatefor isolates with MIC < or = 4 mcg/mL requiresadditional testing.)For uncomplicated UTI caused by E. coli,K. pneumoniae or P. mirabilis: Cefazolin issusceptible if MIC <32 mcg/mL and predictssusceptible to the oral agents cefaclor, cefdinir,cefpodoxime, cefprozil, cefuroxime, cephalexinand loracarbef.Legend:S = Susceptible  I = IntermediateR = Resistant  NS = Not susceptible* = Not tested  NR = Not reported**NN = See antimicrobic comments  Culture, blood (Routine x 2)     Status: None (Preliminary result)   Collection Time: 01/14/17  4:54 PM  Result Value Ref Range   Specimen Description BLOOD RIGHT HAND    Special Requests Blood Culture adequate volume IN PEDIATRIC BOTTLE    Culture NO GROWTH 2 DAYS    Report Status PENDING   Comprehensive metabolic panel     Status: Abnormal   Collection Time: 01/14/17  5:01 PM  Result Value Ref Range   Sodium 133 (L) 135 - 145 mmol/L   Potassium 4.7 3.5 - 5.1 mmol/L   Chloride 99 (L) 101 - 111 mmol/L   CO2 19 (L) 22 - 32 mmol/L   Glucose, Bld 358 (H) 65 -  99 mg/dL   BUN 20 6 - 20 mg/dL   Creatinine, Ser 1.16 (H) 0.44 - 1.00 mg/dL  Calcium 10.0 8.9 - 10.3 mg/dL   Total Protein 7.9 6.5 - 8.1 g/dL   Albumin 3.7 3.5 - 5.0 g/dL   AST 14 (L) 15 - 41 U/L   ALT 15 14 - 54 U/L   Alkaline Phosphatase 91 38 - 126 U/L   Total Bilirubin 1.1 0.3 - 1.2 mg/dL   GFR calc non Af Amer 48 (L) >60 mL/min   GFR calc Af Amer 56 (L) >60 mL/min    Comment: (NOTE) The eGFR has been calculated using the CKD EPI equation. This calculation has not been validated in all clinical situations. eGFR's persistently <60 mL/min signify possible Chronic Kidney Disease.    Anion gap 15 5 - 15  CBC with Differential     Status: Abnormal   Collection Time: 01/14/17  5:01 PM  Result Value Ref Range   WBC 11.9 (H) 4.0 - 10.5 K/uL   RBC 5.56 (H) 3.87 - 5.11 MIL/uL   Hemoglobin 14.9 12.0 - 15.0 g/dL   HCT 46.8 (H) 36.0 - 46.0 %   MCV 84.2 78.0 - 100.0 fL   MCH 26.8 26.0 - 34.0 pg   MCHC 31.8 30.0 - 36.0 g/dL   RDW 13.9 11.5 - 15.5 %   Platelets 189 150 - 400 K/uL   Neutrophils Relative % 80 %   Neutro Abs 9.5 (H) 1.7 - 7.7 K/uL   Lymphocytes Relative 10 %   Lymphs Abs 1.2 0.7 - 4.0 K/uL   Monocytes Relative 10 %   Monocytes Absolute 1.1 (H) 0.1 - 1.0 K/uL   Eosinophils Relative 0 %   Eosinophils Absolute 0.0 0.0 - 0.7 K/uL   Basophils Relative 0 %   Basophils Absolute 0.0 0.0 - 0.1 K/uL  Protime-INR     Status: None   Collection Time: 01/14/17  5:01 PM  Result Value Ref Range   Prothrombin Time 13.2 11.4 - 15.2 seconds   INR 1.01   Culture, blood (Routine x 2)     Status: None (Preliminary result)   Collection Time: 01/14/17  5:07 PM  Result Value Ref Range   Specimen Description BLOOD LEFT ANTECUBITAL    Special Requests Blood Culture adequate volume IN PEDIATRIC BOTTLE    Culture NO GROWTH 2 DAYS    Report Status PENDING   CBG monitoring, ED     Status: Abnormal   Collection Time: 01/14/17  5:32 PM  Result Value Ref Range   Glucose-Capillary 363 (H) 65  - 99 mg/dL  I-Stat CG4 Lactic Acid, ED     Status: None   Collection Time: 01/14/17  5:39 PM  Result Value Ref Range   Lactic Acid, Venous 1.53 0.5 - 1.9 mmol/L  Urinalysis, Routine w reflex microscopic     Status: Abnormal   Collection Time: 01/14/17  7:57 PM  Result Value Ref Range   Color, Urine YELLOW YELLOW   APPearance HAZY (A) CLEAR   Specific Gravity, Urine 1.025 1.005 - 1.030   pH 5.0 5.0 - 8.0   Glucose, UA >=500 (A) NEGATIVE mg/dL   Hgb urine dipstick MODERATE (A) NEGATIVE   Bilirubin Urine NEGATIVE NEGATIVE   Ketones, ur 80 (A) NEGATIVE mg/dL   Protein, ur 30 (A) NEGATIVE mg/dL   Nitrite NEGATIVE NEGATIVE   Leukocytes, UA TRACE (A) NEGATIVE   RBC / HPF 0-5 0 - 5 RBC/hpf   WBC, UA 6-30 0 - 5 WBC/hpf   Bacteria, UA RARE (A) NONE SEEN   Squamous Epithelial / LPF 0-5 (A)  NONE SEEN   Mucus PRESENT     Assessment/Plan: Acute cystitis without hematuria Asymptomatic. Finish entire antibiotic course. ER culture pending. Follow-up 1 week after completion. Will recheck urine at that time.   Bipolar 1 disorder, depressed Followed by [facility]. Question patient's honesty with medication compliance. Is now saying she is taking Lithium 450 mg BID. Will check lithium level and TSH today. Hygiene is impaired so concern for worsening mood despite patient's denial of this. Non-labile. No pressured speech. Poor insight into her health.  Number to [facility] given to patient and daughter so follow-up can be scheduled.     [name], PA-C

## 2017-01-18 LAB — LITHIUM LEVEL: Lithium Lvl: <0.3 mmol/L — ABNORMAL LOW (ref 0.6–1.2)

## 2017-01-19 LAB — CULTURE, BLOOD (ROUTINE X 2)
CULTURE: NO GROWTH
CULTURE: NO GROWTH
SPECIAL REQUESTS: ADEQUATE
Special Requests: ADEQUATE

## 2017-01-24 ENCOUNTER — Ambulatory Visit: Payer: Self-pay | Admitting: Endocrinology

## 2017-01-24 ENCOUNTER — Ambulatory Visit: Payer: Self-pay | Admitting: Physician Assistant

## 2017-01-29 ENCOUNTER — Inpatient Hospital Stay: Admission: RE | Admit: 2017-01-29 | Payer: Self-pay | Source: Ambulatory Visit

## 2017-01-29 ENCOUNTER — Ambulatory Visit: Payer: Self-pay

## 2017-01-29 ENCOUNTER — Ambulatory Visit: Payer: Self-pay | Admitting: Physician Assistant

## 2017-02-01 ENCOUNTER — Other Ambulatory Visit: Payer: Self-pay

## 2017-02-01 ENCOUNTER — Encounter: Payer: Self-pay | Admitting: Physician Assistant

## 2017-02-01 ENCOUNTER — Ambulatory Visit (INDEPENDENT_AMBULATORY_CARE_PROVIDER_SITE_OTHER): Payer: Medicare HMO | Admitting: Physician Assistant

## 2017-02-01 VITALS — BP 98/70 | HR 69 | Temp 98.8°F | Resp 14 | Ht 63.0 in | Wt 129.0 lb

## 2017-02-01 DIAGNOSIS — I10 Essential (primary) hypertension: Secondary | ICD-10-CM

## 2017-02-01 DIAGNOSIS — E119 Type 2 diabetes mellitus without complications: Secondary | ICD-10-CM

## 2017-02-01 DIAGNOSIS — F319 Bipolar disorder, unspecified: Secondary | ICD-10-CM | POA: Diagnosis not present

## 2017-02-01 DIAGNOSIS — Z9114 Patient's other noncompliance with medication regimen: Secondary | ICD-10-CM | POA: Diagnosis not present

## 2017-02-01 DIAGNOSIS — Z794 Long term (current) use of insulin: Secondary | ICD-10-CM | POA: Diagnosis not present

## 2017-02-01 DIAGNOSIS — R4689 Other symptoms and signs involving appearance and behavior: Secondary | ICD-10-CM

## 2017-02-01 LAB — POCT URINALYSIS DIPSTICK
Bilirubin, UA: NEGATIVE
Blood, UA: NEGATIVE
Glucose, UA: >2000
KETONES UA: NEGATIVE
LEUKOCYTES UA: NEGATIVE
NITRITE UA: NEGATIVE
PH UA: 5 (ref 5.0–8.0)
PROTEIN UA: NEGATIVE
SPEC GRAV UA: 1.01 (ref 1.010–1.025)
UROBILINOGEN UA: 0.2 U/dL

## 2017-02-01 LAB — GLUCOSE, POCT (MANUAL RESULT ENTRY): POC Glucose: 493 mg/dl — AB (ref 70–99)

## 2017-02-01 NOTE — Progress Notes (Signed)
Patient presents to clinic today for follow-up regarding uncontrolled DM type II. At last visit, patient was encouraged to start her insulin, 35 units twice daily as directed, continue oral medications, and to check sugar levels 2-3  X daily, record, and bring to follow-up. She was also instructed to have a follow-up scheduled with [facility] before this visit.   In terms of Bipolar disorder, patient has not contacted [facility] as directed. When asked why she has not done this she replies "I don't know, my mood is fine". Again endorses taking her lithium 450 mg BID despite her lithium level being almost non-existent at last visit. Denies questioning that she is not taking medication as she states. Daughter present for exam states she is not taking her medication. Patient does not argue with daughter during questioning.   In reference to diabetes, patient endorses taking insulin twice daily as directed. Daughter denies this, stating that patient's husband and grandson live with patient and note she is still not taking her insulin. Patient again states she took her insulin this morning and is taking as directed. Did not bring in a log of her sugars. States at home they are running in the mid 100s. POC CBG obtained by nurse almost at 500 this morning. Patient has Endo appointment pending as they did not want to go back to her previous Endocrinologist.   Past Medical History:  Diagnosis Date  . Arthritis   . Bipolar depression ([place])   . COPD (chronic obstructive pulmonary disease) ([place])   . Diabetes mellitus without complication ([place])   . Elevated LFTs   . Emphysema of lung ([place])   . GERD (gastroesophageal reflux disease)   . History of adenomatous polyps of colon 10/15/2013  . Hyperlipemia   . Hypertension   . UTI (lower urinary tract infection)     Current Outpatient Medications on File Prior to Visit  Medication Sig Dispense Refill  . glimepiride (AMARYL) 4 MG tablet Take 1 tablet by mouth every  morning.    . Insulin Syringe-Needle U-100 (BD VEO INSULIN SYRINGE U/F) 31G X 15/64" 0.5 ML MISC USE AS DIRECTED TWICE DAILY WITH INSULIN    . metFORMIN (GLUCOPHAGE-XR) 500 MG 24 hr tablet TAKE ONE TABLET BY MOUTH WITH BREAKFAST AND ONE WITH SUPPER FOR  DIABETES 60 tablet 3  . NOVOLIN 70/30 (70-30) 100 UNIT/ML injection Inject 35 units with breakfast and dinner 10 mL 3  . atorvastatin (LIPITOR) 40 MG tablet Take 1 tablet (40 mg total) by mouth daily. (Patient not taking: Reported on 02/01/2017) 30 tablet 3  . cephALEXin (KEFLEX) 500 MG capsule Take 1 capsule (500 mg total) by mouth 4 (four) times daily. (Patient not taking: Reported on 02/01/2017) 28 capsule 0  . lithium carbonate (ESKALITH) 450 MG CR tablet Take 450 mg by mouth 2 (two) times daily.     No current facility-administered medications on file prior to visit.     Allergies  Allergen Reactions  . Metformin And Related Nausea And Vomiting    Family History  Problem Relation Age of Onset  . Colon cancer Neg Hx   . Stomach cancer Neg Hx   . Esophageal cancer Neg Hx   . Rectal cancer Neg Hx     Social History   Socioeconomic History  . Marital status: Single    Spouse name: None  . Number of children: 4  . Years of education: None  . Highest education level: None  Social Needs  . Financial resource strain:  None  . Food insecurity - worry: None  . Food insecurity - inability: None  . Transportation needs - medical: None  . Transportation needs - non-medical: None  Occupational History  . Occupation: Retired   Tobacco Use  . Smoking status: Current Some Day Smoker    Packs/day: 1.00    Types: Cigarettes  . Smokeless tobacco: Former Systems developer    Types: Chew  Substance and Sexual Activity  . Alcohol use: Yes    Comment: beer  . Drug use: No  . Sexual activity: None  Other Topics Concern  . None  Social History Narrative   Daily caffeine    Review of Systems - See HPI.  All other ROS are negative.  BP 98/70    Temp 98.8 F (37.1 C) (Oral)   Resp 14   Ht 5' 3" (1.6 m)   Wt 129 lb (58.5 kg)   BMI 22.85 kg/m   Physical Exam  Constitutional: She is well-developed, well-nourished, and in no distress.  HENT:  Head: Normocephalic and atraumatic.  Eyes: Conjunctivae are normal.  Neck: Neck supple.  Cardiovascular: Normal rate, regular rhythm, normal heart sounds and intact distal pulses.  Pulmonary/Chest: Effort normal and breath sounds normal. No respiratory distress. She has no wheezes. She has no rales. She exhibits no tenderness.  Neurological: She is alert.  Skin: Skin is warm and dry. No rash noted.  Psychiatric: Her mood appears not anxious. She does not express impulsivity. She exhibits a depressed mood. She expresses no homicidal and no suicidal ideation. She expresses no suicidal plans and no homicidal plans. She is apathetic. She exhibits ordered thought content. She has a flat affect.  Poor hygiene.  Poor insight into her medical conditions. Significantly apathetic. Responds appropriately to questioning and commands.  Vitals reviewed.  Recent Results (from the past 2160 hour(s))  CBC w/Diff     Status: Abnormal   Collection Time: 12/21/16 11:34 AM  Result Value Ref Range   WBC 7.4 4.0 - 10.5 K/uL   RBC 5.53 (H) 3.87 - 5.11 Mil/uL   Hemoglobin 14.7 12.0 - 15.0 g/dL   HCT 46.1 (H) 36.0 - 46.0 %   MCV 83.4 78.0 - 100.0 fl   MCHC 32.0 30.0 - 36.0 g/dL   RDW 13.4 11.5 - 15.5 %   Platelets 184.0 150.0 - 400.0 K/uL   Neutrophils Relative % 65.8 43.0 - 77.0 %   Lymphocytes Relative 25.9 12.0 - 46.0 %   Monocytes Relative 6.9 3.0 - 12.0 %   Eosinophils Relative 0.7 0.0 - 5.0 %   Basophils Relative 0.7 0.0 - 3.0 %   Neutro Abs 4.9 1.4 - 7.7 K/uL   Lymphs Abs 1.9 0.7 - 4.0 K/uL   Monocytes Absolute 0.5 0.1 - 1.0 K/uL   Eosinophils Absolute 0.1 0.0 - 0.7 K/uL   Basophils Absolute 0.1 0.0 - 0.1 K/uL  Comp Met (CMET)     Status: Abnormal   Collection Time: 12/21/16 11:34 AM  Result  Value Ref Range   Sodium 134 (L) 135 - 145 mEq/L   Potassium 4.9 3.5 - 5.1 mEq/L   Chloride 96 96 - 112 mEq/L   CO2 30 19 - 32 mEq/L   Glucose, Bld 265 (H) 70 - 99 mg/dL   BUN 12 6 - 23 mg/dL   Creatinine, Ser 0.80 0.40 - 1.20 mg/dL   Total Bilirubin 0.4 0.2 - 1.2 mg/dL   Alkaline Phosphatase 86 39 - 117 U/L   AST 17 0 -  37 U/L   ALT 18 0 - 35 U/L   Total Protein 7.1 6.0 - 8.3 g/dL   Albumin 4.4 3.5 - 5.2 g/dL   Calcium 9.9 8.4 - 10.5 mg/dL   GFR 92.24 >60.00 mL/min  [id number] 11+Oxyco+Alc+Crt-Bund     Status: None   Collection Time: 12/21/16 11:40 AM  Result Value Ref Range   Ethanol Negative Cutoff=0.020 %   Amphetamines, Urine Negative Cutoff=1000 ng/mL    Comment: Amphetamine test includes Amphetamine and Methamphetamine.   Barbiturate Negative Cutoff=200 ng/mL   BENZODIAZ UR QL Negative Cutoff=200 ng/mL   Cannabinoid Quant, Ur Negative Cutoff=50 ng/mL   Cocaine (Metabolite) Negative Cutoff=300 ng/mL   OPIATE SCREEN URINE Negative Cutoff=300 ng/mL    Comment: Opiate test includes Codeine, Morphine, Hydromorphone, Hydrocodone.   OXYCODONE+OXYMORPHONE UR QL SCN Negative Cutoff=300 ng/mL    Comment: Test includes Oxycodone and Oxymorphone   Phencyclidine Negative Cutoff=25 ng/mL   Methadone Screen, Urine Negative Cutoff=300 ng/mL   Propoxyphene Negative Cutoff=300 ng/mL   Meperidine Negative Cutoff=200 ng/mL    Comment: This test was developed and its performance characteristics determined by [facility]. It has not been cleared or approved by the Food and Drug Administration.    Tramadol Negative Cutoff=200 ng/mL   Creatinine 76.4 20.0 - 300.0 mg/dL   PH OF URINE 5.5 4.5 - 8.9  Hemoglobin A1c     Status: Abnormal   Collection Time: 12/28/16 11:13 AM  Result Value Ref Range   Hgb A1c MFr Bld 15.1 (H) 4.6 - 6.5 %    Comment: Glycemic Control Guidelines for People with Diabetes:Non Diabetic:  <6%Goal of Therapy: <7%Additional Action Suggested:  >8%   Lipid panel     Status:  Abnormal   Collection Time: 12/28/16 11:13 AM  Result Value Ref Range   Cholesterol 337 (H) 0 - 200 mg/dL    Comment: ATP III Classification       Desirable:  < 200 mg/dL               Borderline High:  200 - 239 mg/dL          High:  > = 240 mg/dL   Triglycerides 199.0 (H) 0.0 - 149.0 mg/dL    Comment: Normal:  <150 mg/dLBorderline High:  150 - 199 mg/dL   HDL 42.80 >39.00 mg/dL   VLDL 39.8 0.0 - 40.0 mg/dL   LDL Cholesterol 254 (H) 0 - 99 mg/dL   Total CHOL/HDL Ratio 8     Comment:                Men          Women1/2 Average Risk     3.4          3.3Average Risk          5.0          4.42X Average Risk          9.6          7.13X Average Risk          15.0          11.0                       NonHDL 294.02     Comment: NOTE:  Non-HDL goal should be 30 mg/dL higher than patient's LDL goal (i.e. LDL goal of < 70 mg/dL, would have non-HDL goal of < 100 mg/dL)  Urine Microalbumin w/creat. ratio  Status: None   Collection Time: 12/28/16 11:13 AM  Result Value Ref Range   Microalb, Ur 1.9 0.0 - 1.9 mg/dL   Creatinine,U 48.5 mg/dL   Microalb Creat Ratio 4.0 0.0 - 30.0 mg/g  Urinalysis, Routine w reflex microscopic     Status: Abnormal   Collection Time: 12/28/16 11:13 AM  Result Value Ref Range   Color, Urine YELLOW Yellow;Lt. Yellow   APPearance CLEAR Clear   Specific Gravity, Urine 1.010 1.000 - 1.030   pH 6.5 5.0 - 8.0   Total Protein, Urine NEGATIVE Negative   Urine Glucose >=1000 (A) Negative   Ketones, ur 15 (A) Negative   Bilirubin Urine NEGATIVE Negative   Hgb urine dipstick MODERATE (A) Negative   Urobilinogen, UA 0.2 0.0 - 1.0   Leukocytes, UA NEGATIVE Negative   Nitrite NEGATIVE Negative   WBC, UA 21-50/hpf (A) 0-2/hpf   RBC / HPF 7-10/hpf (A) 0-2/hpf   Squamous Epithelial / LPF Rare(0-4/hpf) Rare(0-4/hpf)   Bacteria, UA Many(>50/hpf) (A) None  Urine culture     Status: Abnormal   Collection Time: 12/28/16  5:18 PM  Result Value Ref Range   MICRO NUMBER: [id number]      SPECIMEN QUALITY: ADEQUATE    Sample Source URINE    STATUS: FINAL    ISOLATE 1: Escherichia coli (A)       Susceptibility   Escherichia coli - URINE CULTURE, REFLEX    AMOX/CLAVULANIC 4 Sensitive     AMPICILLIN  Resistant     AMPICILLIN/SULBACTAM 4 Sensitive     CEFAZOLIN* <=4 Not Reportable      * For infections other than uncomplicated UTIcaused by E. coli, K. pneumoniae or P. mirabilis:Cefazolin is resistant if MIC > or = 8 mcg/mL.(Distinguishing susceptible versus intermediatefor isolates with MIC < or = 4 mcg/mL requiresadditional testing.)For uncomplicated UTI caused by E. coli,K. pneumoniae or P. mirabilis: Cefazolin issusceptible if MIC <32 mcg/mL and predictssusceptible to the oral agents cefaclor, cefdinir,cefpodoxime, cefprozil, cefuroxime, cephalexinand loracarbef.    CEFEPIME <=1 Sensitive     CEFTRIAXONE <=1 Sensitive     CIPROFLOXACIN <=0.25 Sensitive     LEVOFLOXACIN <=0.12 Sensitive     ERTAPENEM <=0.5 Sensitive     GENTAMICIN <=1 Sensitive     IMIPENEM <=0.25 Sensitive     NITROFURANTOIN <=16 Sensitive     PIP/TAZO <=4 Sensitive     TOBRAMYCIN <=1 Sensitive     TRIMETH/SULFA* <=20 Sensitive      * For infections other than uncomplicated UTIcaused by E. coli, K. pneumoniae or P. mirabilis:Cefazolin is resistant if MIC > or = 8 mcg/mL.(Distinguishing susceptible versus intermediatefor isolates with MIC < or = 4 mcg/mL requiresadditional testing.)For uncomplicated UTI caused by E. coli,K. pneumoniae or P. mirabilis: Cefazolin issusceptible if MIC <32 mcg/mL and predictssusceptible to the oral agents cefaclor, cefdinir,cefpodoxime, cefprozil, cefuroxime, cephalexinand loracarbef.Legend:S = Susceptible  I = IntermediateR = Resistant  NS = Not susceptible* = Not tested  NR = Not reported**NN = See antimicrobic comments  Culture, blood (Routine x 2)     Status: None   Collection Time: 01/14/17  4:54 PM  Result Value Ref Range   Specimen Description BLOOD RIGHT HAND     Special Requests Blood Culture adequate volume IN PEDIATRIC BOTTLE    Culture NO GROWTH 5 DAYS    Report Status 01/19/2017 FINAL   Comprehensive metabolic panel     Status: Abnormal   Collection Time: 01/14/17  5:01 PM  Result Value Ref Range   Sodium 133 (  L) 135 - 145 mmol/L   Potassium 4.7 3.5 - 5.1 mmol/L   Chloride 99 (L) 101 - 111 mmol/L   CO2 19 (L) 22 - 32 mmol/L   Glucose, Bld 358 (H) 65 - 99 mg/dL   BUN 20 6 - 20 mg/dL   Creatinine, Ser 1.16 (H) 0.44 - 1.00 mg/dL   Calcium 10.0 8.9 - 10.3 mg/dL   Total Protein 7.9 6.5 - 8.1 g/dL   Albumin 3.7 3.5 - 5.0 g/dL   AST 14 (L) 15 - 41 U/L   ALT 15 14 - 54 U/L   Alkaline Phosphatase 91 38 - 126 U/L   Total Bilirubin 1.1 0.3 - 1.2 mg/dL   GFR calc non Af Amer 48 (L) >60 mL/min   GFR calc Af Amer 56 (L) >60 mL/min    Comment: (NOTE) The eGFR has been calculated using the CKD EPI equation. This calculation has not been validated in all clinical situations. eGFR's persistently <60 mL/min signify possible Chronic Kidney Disease.    Anion gap 15 5 - 15  CBC with Differential     Status: Abnormal   Collection Time: 01/14/17  5:01 PM  Result Value Ref Range   WBC 11.9 (H) 4.0 - 10.5 K/uL   RBC 5.56 (H) 3.87 - 5.11 MIL/uL   Hemoglobin 14.9 12.0 - 15.0 g/dL   HCT 46.8 (H) 36.0 - 46.0 %   MCV 84.2 78.0 - 100.0 fL   MCH 26.8 26.0 - 34.0 pg   MCHC 31.8 30.0 - 36.0 g/dL   RDW 13.9 11.5 - 15.5 %   Platelets 189 150 - 400 K/uL   Neutrophils Relative % 80 %   Neutro Abs 9.5 (H) 1.7 - 7.7 K/uL   Lymphocytes Relative 10 %   Lymphs Abs 1.2 0.7 - 4.0 K/uL   Monocytes Relative 10 %   Monocytes Absolute 1.1 (H) 0.1 - 1.0 K/uL   Eosinophils Relative 0 %   Eosinophils Absolute 0.0 0.0 - 0.7 K/uL   Basophils Relative 0 %   Basophils Absolute 0.0 0.0 - 0.1 K/uL  Protime-INR     Status: None   Collection Time: 01/14/17  5:01 PM  Result Value Ref Range   Prothrombin Time 13.2 11.4 - 15.2 seconds   INR 1.01   Culture, blood (Routine x 2)      Status: None   Collection Time: 01/14/17  5:07 PM  Result Value Ref Range   Specimen Description BLOOD LEFT ANTECUBITAL    Special Requests Blood Culture adequate volume IN PEDIATRIC BOTTLE    Culture NO GROWTH 5 DAYS    Report Status 01/19/2017 FINAL   CBG monitoring, ED     Status: Abnormal   Collection Time: 01/14/17  5:32 PM  Result Value Ref Range   Glucose-Capillary 363 (H) 65 - 99 mg/dL  I-Stat CG4 Lactic Acid, ED     Status: None   Collection Time: 01/14/17  5:39 PM  Result Value Ref Range   Lactic Acid, Venous 1.53 0.5 - 1.9 mmol/L  Urinalysis, Routine w reflex microscopic     Status: Abnormal   Collection Time: 01/14/17  7:57 PM  Result Value Ref Range   Color, Urine YELLOW YELLOW   APPearance HAZY (A) CLEAR   Specific Gravity, Urine 1.025 1.005 - 1.030   pH 5.0 5.0 - 8.0   Glucose, UA >=500 (A) NEGATIVE mg/dL   Hgb urine dipstick MODERATE (A) NEGATIVE   Bilirubin Urine NEGATIVE NEGATIVE   Ketones,  ur 80 (A) NEGATIVE mg/dL   Protein, ur 30 (A) NEGATIVE mg/dL   Nitrite NEGATIVE NEGATIVE   Leukocytes, UA TRACE (A) NEGATIVE   RBC / HPF 0-5 0 - 5 RBC/hpf   WBC, UA 6-30 0 - 5 WBC/hpf   Bacteria, UA RARE (A) NONE SEEN   Squamous Epithelial / LPF 0-5 (A) NONE SEEN   Mucus PRESENT   Lithium level     Status: Abnormal   Collection Time: 01/17/17 10:41 AM  Result Value Ref Range   Lithium Lvl <0.3 (L) 0.6 - 1.2 mmol/L    Comment: Verified by repeat analysis. Marland Kitchen   TSH     Status: None   Collection Time: 01/17/17 10:41 AM  Result Value Ref Range   TSH 0.50 0.35 - 4.50 uIU/mL   Assessment/Plan: 1. Type 2 diabetes mellitus without complication, with long-term current use of insulin (HCC) CBG significantly elevated. Urine dip with 2000+ glucose. Patient is clearly not taking her insulin as directed. ER triage given due to significant elevation in glucose so that she can get IV fluids and fast-acting insulin. - POCT glucose (manual entry) - POCT Urinalysis Dipstick  2.  Essential hypertension BP stable today. Asymptomatic.   3. Bipolar 1 disorder, depressed ([place]) Patient non-adherent with regimen. Has not followed recommendations. She is encouraged to schedule with [facility]. Spoke to daughters on [place], concern for self-neglect due to her Bipolar disorder. Discussed Medical Psych inpatient which they want her to have. They are to take her to [facility] for ER assessment for hyperglycemia and [facility] consult for inpatient admission.  4. Patient's other noncompliance with medication regimen 5. Self neglect As noted above, needs inpatient assessment. Cannot continue to supervise patient if she nor family are willing to follow instructions and be compliant with medications.   [name], PA-C

## 2017-02-01 NOTE — Patient Instructions (Signed)
Please go to the ER for assessment of significantly elevated glucose -- you need IV fluids to lower levels. I am still not convinced you are taking insulin as directed based on glucose readings and all the input from your family. Your lithium levels are low despite saying you are taking medication and you are not following up with your Psychiatrist as I have requested several times.  I will not be able to take care of you if you are not following recommendations and taking medications as directed.

## 2017-02-03 ENCOUNTER — Emergency Department (HOSPITAL_COMMUNITY)
Admission: EM | Admit: 2017-02-03 | Discharge: 2017-02-03 | Disposition: A | Discharge status: Home / Self Care | Payer: Medicare HMO | Attending: Emergency Medicine | Admitting: Emergency Medicine

## 2017-02-03 ENCOUNTER — Other Ambulatory Visit: Payer: Self-pay

## 2017-02-03 ENCOUNTER — Encounter (HOSPITAL_COMMUNITY): Payer: Self-pay | Admitting: *Deleted

## 2017-02-03 DIAGNOSIS — J449 Chronic obstructive pulmonary disease, unspecified: Secondary | ICD-10-CM | POA: Insufficient documentation

## 2017-02-03 DIAGNOSIS — F17210 Nicotine dependence, cigarettes, uncomplicated: Secondary | ICD-10-CM | POA: Insufficient documentation

## 2017-02-03 DIAGNOSIS — E1165 Type 2 diabetes mellitus with hyperglycemia: Secondary | ICD-10-CM | POA: Diagnosis not present

## 2017-02-03 DIAGNOSIS — I10 Essential (primary) hypertension: Secondary | ICD-10-CM | POA: Insufficient documentation

## 2017-02-03 DIAGNOSIS — Z794 Long term (current) use of insulin: Secondary | ICD-10-CM | POA: Insufficient documentation

## 2017-02-03 DIAGNOSIS — R739 Hyperglycemia, unspecified: Secondary | ICD-10-CM

## 2017-02-03 LAB — BASIC METABOLIC PANEL
ANION GAP: 13 (ref 5–15)
BUN: 21 mg/dL — ABNORMAL HIGH (ref 6–20)
CALCIUM: 9.5 mg/dL (ref 8.9–10.3)
CHLORIDE: 91 mmol/L — AB (ref 101–111)
CO2: 23 mmol/L (ref 22–32)
Creatinine, Ser: 0.96 mg/dL (ref 0.44–1.00)
GFR calc non Af Amer: >60 mL/min (ref >60)
GLUCOSE: 480 mg/dL — AB (ref 65–99)
POTASSIUM: 4.8 mmol/L (ref 3.5–5.1)
Sodium: 127 mmol/L — ABNORMAL LOW (ref 135–145)

## 2017-02-03 LAB — URINALYSIS, ROUTINE W REFLEX MICROSCOPIC
BACTERIA UA: NONE SEEN
Bilirubin Urine: NEGATIVE
HGB URINE DIPSTICK: NEGATIVE
Ketones, ur: 5 mg/dL — AB
Nitrite: NEGATIVE
PROTEIN: NEGATIVE mg/dL
Specific Gravity, Urine: 1.024 (ref 1.005–1.030)
pH: 5 (ref 5.0–8.0)

## 2017-02-03 LAB — CBC
HEMATOCRIT: 44.6 % (ref 36.0–46.0)
HEMOGLOBIN: 14.5 g/dL (ref 12.0–15.0)
MCH: 26.7 pg (ref 26.0–34.0)
MCHC: 32.5 g/dL (ref 30.0–36.0)
MCV: 82 fL (ref 78.0–100.0)
Platelets: 224 10*3/uL (ref 150–400)
RBC: 5.44 MIL/uL — AB (ref 3.87–5.11)
RDW: 13.1 % (ref 11.5–15.5)
WBC: 10.1 10*3/uL (ref 4.0–10.5)

## 2017-02-03 LAB — CBG MONITORING, ED
GLUCOSE-CAPILLARY: 346 mg/dL — AB (ref 65–99)
GLUCOSE-CAPILLARY: 218 mg/dL — AB (ref 65–99)
Glucose-Capillary: 430 mg/dL — ABNORMAL HIGH (ref 65–99)
Glucose-Capillary: 434 mg/dL — ABNORMAL HIGH (ref 65–99)

## 2017-02-03 MED ORDER — SODIUM CHLORIDE 0.9 % IV BOLUS (SEPSIS)
1000.0000 mL | Freq: Once | INTRAVENOUS | Status: AC
  Administered 2017-02-03: 1000 mL via INTRAVENOUS

## 2017-02-03 MED ORDER — INSULIN ASPART 100 UNIT/ML ~~LOC~~ SOLN
4.0000 [IU] | Freq: Once | SUBCUTANEOUS | Status: AC
  Administered 2017-02-03: 4 [IU] via SUBCUTANEOUS
  Filled 2017-02-03: qty 1

## 2017-02-03 MED ORDER — INSULIN ASPART 100 UNIT/ML ~~LOC~~ SOLN
5.0000 [IU] | Freq: Once | SUBCUTANEOUS | Status: AC
  Administered 2017-02-03: 5 [IU] via INTRAVENOUS
  Filled 2017-02-03: qty 1

## 2017-02-03 MED ORDER — SODIUM CHLORIDE 0.9 % IV BOLUS (SEPSIS)
1500.0000 mL | Freq: Once | INTRAVENOUS | Status: AC
  Administered 2017-02-03: 1500 mL via INTRAVENOUS

## 2017-02-03 MED ORDER — INSULIN ASPART PROT & ASPART (70-30 MIX) 100 UNIT/ML ~~LOC~~ SUSP
35.0000 [IU] | Freq: Once | SUBCUTANEOUS | Status: AC
  Administered 2017-02-03: 35 [IU] via SUBCUTANEOUS
  Filled 2017-02-03: qty 10

## 2017-02-03 NOTE — ED Provider Notes (Signed)
[facility] EMERGENCY DEPARTMENT Provider Note   CSN: [id number] Arrival date & time: 02/03/17  1114     History   Chief Complaint Chief Complaint  Patient presents with  . Hyperglycemia    HPI Kathryn Mcguire is a 67 y.o. female.  [place]agi.[place]ning F w/ PMH including IDDM, COPD, bipolar d/o, HTN who p/w hyperglycemia.  She reports several days of elevated blood sugars at home.  She recently had a urinary tract infection and received treatment, states that she completed the course of antibiotics and denies any current urinary symptoms.  She reports taking her medications as prescribed although family member notes that she has multiple bottles of insulin at home which should be empty by now and patient admits that she has not had her medication this morning.  Chart review shows that she was at PCP office 2 days ago and they recommended that she go to the ED because of hyperglycemia.  They decided not to go to the ER that they recommended and instead came here today because of persistent elevated blood sugar.  She denies any vomiting, diarrhea, fevers, cough/cold symptoms, or pain.  She denies any polyuria or polydipsia.   The history is provided by the patient.    Past Medical History:  Diagnosis Date  . Arthritis   . Bipolar depression ([place])   . COPD (chronic obstructive pulmonary disease) ([place])   . Diabetes mellitus without complication ([place])   . Elevated LFTs   . Emphysema of lung ([place])   . GERD (gastroesophageal reflux disease)   . History of adenomatous polyps of colon 10/15/2013  . Hyperlipemia   . Hypertension   . UTI (lower urinary tract infection)     Patient Active Problem List   Diagnosis Date Noted  . Acute cystitis without hematuria 01/17/2017  . Breast cancer screening 12/30/2016  . Hypertension 12/21/2016  . Type 2 diabetes mellitus without complications ([place]) 01/60/1093  . Patient's other noncompliance with medication regimen 06/08/2014  . Bipolar  1 disorder, depressed ([place]) 01/18/2014  . Hyperglycemia   . Noncompliance with medication regimen   . Psychiatric illness   . Long-term insulin use ([place]) 01/07/2014  . Vitamin D deficiency 01/06/2014  . History of adenomatous polyps of colon 10/15/2013  . Abnormal LFTs 08/25/2013  . Transaminitis 06/17/2013  . Depression 06/15/2013  . Severe bipolar I disorder, most recent episode mixed, with psychotic features, mood-incongruent ([place]) 06/15/2013  . Dyslipidemia 04/20/2013  . GERD (gastroesophageal reflux disease) 04/20/2013  . Osteoporosis 04/20/2013  . COPD (chronic obstructive pulmonary disease) ([place]) 06/16/2012  . S/P colonoscopic polypectomy 06/16/2012    Past Surgical History:  Procedure Laterality Date  . TONSILLECTOMY    . TONSILLECTOMY AND ADENOIDECTOMY    . TUBAL LIGATION      OB History    No data available       Home Medications    Prior to Admission medications   Medication Sig Start Date End Date Taking? Authorizing Provider  glimepiride (AMARYL) 4 MG tablet Take 1 tablet by mouth every morning. 09/23/16  Yes [provider]  hydroxypropyl methylcellulose / hypromellose (ISOPTO TEARS / GONIOVISC) 2.5 % ophthalmic solution Place 1 drop into both eyes as needed for dry eyes.   Yes [provider]  lithium carbonate (ESKALITH) 450 MG CR tablet Take 450 mg by mouth 2 (two) times daily.   Yes [provider]  metFORMIN (GLUCOPHAGE-XR) 500 MG 24 hr tablet TAKE ONE TABLET BY MOUTH WITH BREAKFAST AND  ONE WITH SUPPER FOR  DIABETES 01/04/17  Yes Brunetta Jeans, PA-C  NOVOLIN 70/30 (70-30) 100 UNIT/ML injection Inject 35 units with breakfast and dinner 01/04/17  Yes Brunetta Jeans, PA-C  atorvastatin (LIPITOR) 40 MG tablet Take 1 tablet (40 mg total) by mouth daily. Patient not taking: Reported on 02/01/2017 12/28/16   Brunetta Jeans, PA-C  Insulin Syringe-Needle U-100 (BD VEO INSULIN SYRINGE U/F) 31G X 15/64" 0.5 ML MISC USE AS DIRECTED  TWICE DAILY WITH INSULIN 09/23/16   [provider]    Family History Family History  Problem Relation Age of Onset  . Colon cancer Neg Hx   . Stomach cancer Neg Hx   . Esophageal cancer Neg Hx   . Rectal cancer Neg Hx     Social History Social History   Tobacco Use  . Smoking status: Current Some Day Smoker    Packs/day: 1.00    Types: Cigarettes  . Smokeless tobacco: Former Systems developer    Types: Chew  Substance Use Topics  . Alcohol use: Yes    Comment: beer  . Drug use: No     Allergies   Metformin and related   Review of Systems Review of Systems All other systems reviewed and are negative except that which was mentioned in HPI   Physical Exam Updated Vital Signs BP 108/77   Pulse 66   Temp 97.9 F (36.6 C) (Oral)   Resp 13   Ht 5\' 3"  (1.6 m)   Wt 57.2 kg (126 lb)   SpO2 100%   BMI 22.32 kg/m   Physical Exam  Constitutional: She is oriented to person, place, and time. She appears well-developed and well-nourished. No distress.  HENT:  Head: Normocephalic and atraumatic.  dry mucous membranes  Eyes: Conjunctivae are normal.  Neck: Neck supple.  Cardiovascular: Normal rate, regular rhythm and normal heart sounds.  No murmur heard. Pulmonary/Chest: Effort normal and breath sounds normal.  Abdominal: Soft. Bowel sounds are normal. She exhibits no distension. There is no tenderness.  Musculoskeletal: She exhibits no edema.  Neurological: She is alert and oriented to person, place, and time.  Fluent speech  Skin: Skin is warm and dry.  Psychiatric:  Quiet, flat affect  Nursing note and vitals reviewed.    ED Treatments / Results  Labs (all labs ordered are listed, but only abnormal results are displayed) Labs Reviewed  BASIC METABOLIC PANEL - Abnormal; Notable for the following components:      Result Value   Sodium 127 (*)    Chloride 91 (*)    Glucose, Bld 480 (*)    BUN 21 (*)    All other components within normal limits  CBC -  Abnormal; Notable for the following components:   RBC 5.44 (*)    All other components within normal limits  URINALYSIS, ROUTINE W REFLEX MICROSCOPIC - Abnormal; Notable for the following components:   Color, Urine STRAW (*)    Glucose, UA >=500 (*)    Ketones, ur 5 (*)    Leukocytes, UA TRACE (*)    Squamous Epithelial / LPF 0-5 (*)    All other components within normal limits  CBG MONITORING, ED - Abnormal; Notable for the following components:   Glucose-Capillary 430 (*)    All other components within normal limits  CBG MONITORING, ED - Abnormal; Notable for the following components:   Glucose-Capillary 346 (*)    All other components within normal limits  CBG MONITORING, ED  CBG MONITORING,  ED    EKG  EKG Interpretation None       Radiology No results found.  Procedures Procedures (including critical care time)  Medications Ordered in ED Medications  sodium chloride 0.9 % bolus 1,500 mL (0 mLs Intravenous Stopped 02/03/17 1541)  insulin aspart protamine- aspart (NOVOLOG MIX 70/30) injection 35 Units (35 Units Subcutaneous Given 02/03/17 1450)  sodium chloride 0.9 % bolus 1,000 mL (1,000 mLs Intravenous New Bag/Given 02/03/17 1630)  insulin aspart (novoLOG) injection 4 Units (4 Units Subcutaneous Given 02/03/17 1630)     Initial Impression / Assessment and Plan / ED Course  I have reviewed the triage vital signs and the nursing notes.  Pertinent labs  that were available during my care of the patient were reviewed by me and considered in my medical decision making (see chart for details).     Pt here denying any complaints, non-toxic on exam, BP 95/72, HR 68, afebrile.  Her PCP note from 2 days ago voices concerns about noncompliance with medication and I am also suspicious that noncompliance is contributing to her elevated blood sugar levels.  I have discussed with family member the importance of having her medication administration supervised by other family members  with whom she lives.  Gave an IV fluid bolus and NovoLog here.  Labs show blood glucose 480 with normal creatinine, normal anion gap, reassuring CBC, no evidence of DKA.  After receiving insulin and fluids here, blood sugar improved to 346. Gave 2nd fluid bolus and short acting insulin, repeat BG 434. PT had eaten crackers and peanut butter initially here.  I am giving a second dose of short acting insulin and we will repeat check blood sugar afterwards.  I am signing out to the oncoming provider, Dr. Sherry Ruffing, who will follow up on her progress. Final Clinical Impressions(s) / ED Diagnoses   Final diagnoses:  None    ED Discharge Orders    None       Jonita Hirota, Wenda Overland, MD 02/03/17 1723

## 2017-02-03 NOTE — ED Triage Notes (Signed)
Pt reports high cbg for several days. Had recent UTI that she was treated for but denies any urinary symptoms. Has been taking her meds as prescribed. No acute distress is noted at triage.

## 2017-02-03 NOTE — Discharge Instructions (Signed)
Please continue your outpatient management of your diabetes.  Please stay hydrated.  Please follow-up with your doctor in several days and if your symptoms begin to change or worsen acutely, please return to the nearest emergency department.

## 2017-02-03 NOTE — ED Provider Notes (Signed)
Care assumed from Dr. Rex Kras.  Next  Patient was evaluated for hyperglycemia and had workup to improve glucose level as well as look for infections.  Patient had no evidence of infection on workup.  Patient was feeling better after her sugars improved.  Patient had a glucose that was in the 200s on reevaluation.  Given patient's improvement in glucose, patient feels comfortable going home to continue her outpatient diabetes management as well as speaking with her PCP tomorrow and endocrinologist later this week.  Patient and family understood return precautions for any new or worsening symptoms.  Patient had no other questions or concerns and patient was discharged in good condition with improving glucose.  Clinical Impression: 1. Hyperglycemia     Disposition: Discharge  Condition: Good  I have discussed the results, Dx and Tx plan with the pt(& family if present). He/she/they expressed understanding and agree(s) with the plan. Discharge instructions discussed at great length. Strict return precautions discussed and pt &/or family have verbalized understanding of the instructions. No further questions at time of discharge.    New Prescriptions   No medications on file    Follow Up: Delorse Limber [address] [place] [address] [phone]  In 2 days      Tegeler, Gwenyth Allegra, MD 02/03/17 2337

## 2017-02-03 NOTE — ED Notes (Signed)
Pt ambulated to bathroom with steady gait. 

## 2017-02-04 DIAGNOSIS — R4689 Other symptoms and signs involving appearance and behavior: Secondary | ICD-10-CM | POA: Insufficient documentation

## 2017-02-04 DIAGNOSIS — I10 Essential (primary) hypertension: Secondary | ICD-10-CM | POA: Insufficient documentation

## 2017-02-07 ENCOUNTER — Telehealth: Payer: Self-pay

## 2017-02-07 NOTE — Telephone Encounter (Signed)
LM requesting call back to schedule ER follow up. 

## 2017-02-09 DIAGNOSIS — I10 Essential (primary) hypertension: Secondary | ICD-10-CM | POA: Diagnosis not present

## 2017-02-09 DIAGNOSIS — F3164 Bipolar disorder, current episode mixed, severe, with psychotic features: Secondary | ICD-10-CM | POA: Diagnosis not present

## 2017-02-09 DIAGNOSIS — Z9119 Patient's noncompliance with other medical treatment and regimen: Secondary | ICD-10-CM | POA: Diagnosis not present

## 2017-02-09 DIAGNOSIS — R739 Hyperglycemia, unspecified: Secondary | ICD-10-CM | POA: Diagnosis not present

## 2017-02-09 DIAGNOSIS — E1165 Type 2 diabetes mellitus with hyperglycemia: Secondary | ICD-10-CM | POA: Diagnosis not present

## 2017-02-09 DIAGNOSIS — Z79899 Other long term (current) drug therapy: Secondary | ICD-10-CM | POA: Diagnosis not present

## 2017-02-09 DIAGNOSIS — Z794 Long term (current) use of insulin: Secondary | ICD-10-CM | POA: Diagnosis not present

## 2017-02-09 DIAGNOSIS — Z9114 Patient's other noncompliance with medication regimen: Secondary | ICD-10-CM | POA: Diagnosis not present

## 2017-02-09 DIAGNOSIS — R531 Weakness: Secondary | ICD-10-CM | POA: Diagnosis not present

## 2017-02-09 DIAGNOSIS — J449 Chronic obstructive pulmonary disease, unspecified: Secondary | ICD-10-CM | POA: Diagnosis not present

## 2017-02-09 DIAGNOSIS — E118 Type 2 diabetes mellitus with unspecified complications: Secondary | ICD-10-CM | POA: Diagnosis not present

## 2017-02-09 DIAGNOSIS — K219 Gastro-esophageal reflux disease without esophagitis: Secondary | ICD-10-CM | POA: Diagnosis not present

## 2017-02-09 DIAGNOSIS — E785 Hyperlipidemia, unspecified: Secondary | ICD-10-CM | POA: Diagnosis not present

## 2017-02-10 DIAGNOSIS — F3164 Bipolar disorder, current episode mixed, severe, with psychotic features: Secondary | ICD-10-CM | POA: Diagnosis not present

## 2017-02-10 DIAGNOSIS — R739 Hyperglycemia, unspecified: Secondary | ICD-10-CM | POA: Diagnosis not present

## 2017-02-10 DIAGNOSIS — Z9119 Patient's noncompliance with other medical treatment and regimen: Secondary | ICD-10-CM | POA: Diagnosis not present

## 2017-02-10 DIAGNOSIS — E118 Type 2 diabetes mellitus with unspecified complications: Secondary | ICD-10-CM | POA: Diagnosis not present

## 2017-02-11 ENCOUNTER — Telehealth: Payer: Self-pay | Admitting: *Deleted

## 2017-02-11 ENCOUNTER — Telehealth: Payer: Self-pay | Admitting: General Practice

## 2017-02-11 MED ORDER — CEPHALEXIN 250 MG PO CAPS
500.00 | ORAL_CAPSULE | ORAL | Status: DC
Start: 2017-02-10 — End: 2017-02-11

## 2017-02-11 MED ORDER — ONDANSETRON HCL 4 MG PO TABS
4.00 | ORAL_TABLET | ORAL | Status: DC
Start: ? — End: 2017-02-11

## 2017-02-11 MED ORDER — METFORMIN HCL ER 500 MG PO TB24
500.00 | ORAL_TABLET | ORAL | Status: DC
Start: ? — End: 2017-02-11

## 2017-02-11 MED ORDER — DEXTROMETHORPHAN-GUAIFENESIN 10-100 MG/5ML PO SYRP
10.00 | ORAL_SOLUTION | ORAL | Status: DC
Start: ? — End: 2017-02-11

## 2017-02-11 MED ORDER — ACETAMINOPHEN 325 MG PO TABS
650.00 | ORAL_TABLET | ORAL | Status: DC
Start: ? — End: 2017-02-11

## 2017-02-11 MED ORDER — ENOXAPARIN SODIUM 40 MG/0.4ML ~~LOC~~ SOLN
40.00 | SUBCUTANEOUS | Status: DC
Start: ? — End: 2017-02-11

## 2017-02-11 MED ORDER — DOCUSATE SODIUM 100 MG PO CAPS
100.00 | ORAL_CAPSULE | ORAL | Status: DC
Start: 2017-02-10 — End: 2017-02-11

## 2017-02-11 MED ORDER — LITHIUM CARBONATE ER 450 MG PO TBCR
450.00 | EXTENDED_RELEASE_TABLET | ORAL | Status: DC
Start: 2017-02-10 — End: 2017-02-11

## 2017-02-11 MED ORDER — SODIUM CHLORIDE 0.9 % IV SOLN
INTRAVENOUS | Status: DC
Start: ? — End: 2017-02-11

## 2017-02-11 MED ORDER — ALBUTEROL SULFATE (2.5 MG/3ML) 0.083% IN NEBU
2.50 | INHALATION_SOLUTION | RESPIRATORY_TRACT | Status: DC
Start: ? — End: 2017-02-11

## 2017-02-11 MED ORDER — INSULIN LISPRO PROT & LISPRO (75-25 MIX) 100 UNIT/ML ~~LOC~~ SUSP
SUBCUTANEOUS | Status: DC
Start: 2017-02-10 — End: 2017-02-11

## 2017-02-11 NOTE — Telephone Encounter (Signed)
Copied from [place] [phone]. Topic: General - Other >> Feb 11, 2017  9:17 AM Valla Leaver wrote: Reason for CRM: Patient's daughter Glenard Haring calling to req a callback from [place] regarding his direction to take the patient to [facility] center where her sister reported they were treated teribly, received no help, and was told by staff there that E[facility] Pheasant "had no authority" to send the patient there. Glenard Haring was also advised by [place] on call phys last Friday 01/01 that [facility] does not have admitting privileges at [facility]. I've noted the best callback times for [facility] in the "contact" field. Also she says one of her family members will call to get the patient scheduled for the ER f/u soon.

## 2017-02-11 NOTE — Telephone Encounter (Signed)
I have reviewed notes from [place]. I do not have authority to admit which I did not do, but recommended they go there for medical and psychiatric treatment as they have noted at visits that [facility] ER/Hospital has not been helpful to them. I had also spoken to one of the charge nurses at [facility] who noted they would admit patient if she met criteria. That day they did not go to [facility] as recommended, going to [facility] ER 2 days later instead. Went to ER 02/11/17 at [facility] and was being admitted for medical and psychiatric treatment but patient and family signed her out AMA due to wait time for a bed. There is not much that I can do since patient is competent and they are not proceeding with recommendations. I will discuss at their follow-up appointment.

## 2017-02-11 NOTE — Telephone Encounter (Signed)
Message received form [employer]:   Kathryn Mcguire Patient Name: Kathryn Mcguire Gender: Female DOB: [birth year] Age: 67 Y 32 M 22 D Return Phone Number: [phone] (Primary), [phone] (Secondary) Address: [place] [address] Client [facility] [place] Night - C Client Site [place] Physician Kathryn Mcguire - MD Contact Type Call Who Is Calling Patient / Member / Family / Caregiver Call Type Triage / Clinical Caller Name [name] Relationship To Patient Daughter Return Phone Number [phone] (Primary) Chief Complaint Blood Sugar High Reason for Call Request to Speak to a Physician Initial Comment Caller's mother was seen 01/25, was supposed to have been admitted to [facility] through the ED to get her sugar down then transferred to behavioral health, [name] has no record of her Dr calling, wants to know what to do to go about getting her mother admitted **[name], PA is not listed Translation No No Triage Reason Patient declined Nurse Assessment Nurse: [name], RN, [name] Date/Time (Eastern Time): 02/09/2017 9:25:24 PM Confirm and document reason for call. If symptomatic, describe symptoms. ---Caller states Mom was seen Last Friday, sugar was over 500, this was [name] PA. He recommended her go to [facility] to be admitted and to evaluate and have a behavioral health consult. Sisters took her over to [facility] and the hospital said [name] PA can't admit her or do any of this. She has early onset dementia. Started 2.5 y ago. She takes lithium, metformin etc. She also chain smoking and has started drinking. Glucose was 594 yesterday. Sisters are at the ER with Mom and they gave her fluids and now her sugar is 400 something. She is also Bipolar. Needs to get something  done. Denies triage. Does the patient have any new or worsening symptoms? ---Yes Will a triage be completed? ---No Select reason for no triage. ---Patient declined Guidelines Guideline Title Affirmed Question Affirmed Notes Nurse Date/Time (Eastern Time) Disp. Time [name] Time) Disposition Final User 02/09/2017 9:44:48 PM Called On-Call Provider Shook-Minyard, RN, [name] PLEASE NOTE: All timestamps contained within this report are represented as [place] Standard Time. CONFIDENTIALTY NOTICE: This fax transmission is intended only for the addressee. It contains information that is legally privileged, confidential or otherwise protected from use or disclosure. If you are not the intended recipient, you are strictly prohibited from reviewing, disclosing, copying using or disseminating any of this information or taking any action in reliance on or regarding this information. If you have received this fax in error, please notify us immediately by telephone so that we can arrange for its return to [place]. Phone: [phone], Toll-Free: [phone], Fax: [phone] Page: 2 of 2 Call Id: [id number] 02/09/2017 9:53:57 PM Clinical Call Yes Shook-Minyard, RN, [name] Comments User: Janece Canterbury, RN Date/Time [name] Time): 02/09/2017 9:53:48 PM Spoke with Glenard Haring and explained what Dr,Copland and I discussed with her going to [facility]. They don't feel comfortable with her there or really any of the hospitals around. She is being admitted tonight at [facility] I told her to think about things and see what they think they want to do or if any of the options we talked about were acceptable. Paging DoctorName Phone DateTime Result/Outcome Message Type Notes Owens Loffler - MD [phone] 02/09/2017 9:44:48 PM Called On Call Provider - Reached Doctor Paged Owens Loffler - MD 02/09/2017 9:45:29 PM Spoke with On Call - General Message Result Needs to  go to [facility] for evaluation,  they have psych facilities. No priveleges at [facility].

## 2017-02-11 NOTE — Telephone Encounter (Signed)
See phone note 02/11/17

## 2017-02-12 ENCOUNTER — Telehealth: Payer: Self-pay | Admitting: Physician Assistant

## 2017-02-12 NOTE — Telephone Encounter (Signed)
Copied from [place] [id number]. Topic: Quick Communication - See Telephone Encounter >> Feb 12, 2017  5:07 PM Cleaster Corin, NT wrote: CRM for notification. See Telephone encounter for:   02/12/17. Pt. Daughter Elouise Munroe calling to speak with Einar Pheasant about pt. Not taking meds. And needs to know what to do next.  blood sugar in the 300's up to 500's. Offered NT but pt. Daughter refused wants to personally speak with [place]. Glenard Haring can be reached at [phone].

## 2017-02-13 NOTE — Telephone Encounter (Signed)
Please speak with Kathryn Mcguire. There is not much more that I can do. I reviewed all notes from [facility]. The patient was in the process of admission as the hospitalist had even completed a note. They were just waiting on a room to become available to move her on the floor and patient and family signed patient out AMA. There is not much more I can do if they will not go through with my recommendations.

## 2017-02-14 ENCOUNTER — Telehealth: Payer: Self-pay | Admitting: Physician Assistant

## 2017-02-14 DIAGNOSIS — R4189 Other symptoms and signs involving cognitive functions and awareness: Secondary | ICD-10-CM

## 2017-02-14 DIAGNOSIS — R4689 Other symptoms and signs involving appearance and behavior: Secondary | ICD-10-CM

## 2017-02-14 NOTE — Telephone Encounter (Signed)
I spoke at length with patients daughter about how we can help her and how we cannot help her. Daughter thought that we could directly admit the patient. I explained to her that our providers cannot do direct admits. That the patient would have to go to the ER and be assessed and be admitted.  Daughter states she has moved away and is trying to help coordinate her mothers care. Staes that entire family is frustrated and they were told when they went to [facility] emergency room that they should not have come there. Daughter states they left the emergency room because providers there did not treat her mother well and spoke negatively about her PCP [name]. Daughter is upset states that her mother has dementia and its not been re-evaluated and she cannot wait until April for her neuropsychological testing. We offered to get a scan for the patient and try and get it pre-CERT it for her. E[facility] Pheasant agreed and if we get it pre-certed  and the patient does not follow through with MRI we will not be able to assist her any further with her mothers condition. Her mother continues to pass all the mini mental status exams with flying colors.  Therefore, insurance may not approve the scan.

## 2017-02-14 NOTE — Telephone Encounter (Signed)
This is unfortunately a very complicated situation. The patient answers all questions appropriately in office, scoring a 29/30 on her MMSE. She has never presented with any type of confusion or inappropriate behavior in office as endorses by family members when she is at home. I have discussed with patient and multiple family members that Bipolar Depression that is not being treated is most likely culprit of her significant apathy and self-neglect. They are not scheduling her for follow-up with her [facility] provider as directed multiple times. They are not encouraging or making mother take her medications at home which is why she has significantly uncontrolled diabetes and why she is showing neglect of her hygiene, etc. When the recommendation for them to be seen at ER was made previously, they were not wanting to go to a [facility] as they stated they have gone previously and they will not admit her mother to inpatient psychiatric care due to sugars. Discussed different options for facilities that offer inpatient med-psych care if patient meets ER criteria for [facility] admission. That is when [facility] was recommended as I spoke with a [facility] provider there that stated she felt they would be able to help. She, nor her family, were ever told they would be directly admitted to the hospital there. Unfortunately when they did take her to [facility] several days later, they were admitted to the hospital as I can see in [facility] records that the Hospitalist had completed his history/physical as well as placed orders for patient. They were simply waiting for an available room to move her from the ER to the floor. The family and patient were upset about the wait per MD and RN noted there and so the family and patient left AMA after signing AMA forms.  I really wish they would had stayed as we would like be much further along than we are now.   In regards to imaging, I am happy to order. The issue does not  seem to be dementia as noted before. The issue is with mental health issues and self neglect. I will place order for MRI to further assess due to endorsed cognitive changes. The challenging part will be for the patient herself to go through with the testing. If they cannot start working with mother and if she will not be adherent to follow-up with their [facility] provider, there is not much more I will be able to do. The patient has so far been deemed competent at every ER visit and every visit with prior providers that I can see.   MRI order placed. Please let patient and family know that they will be contacted to schedule once this is approved through insurance.

## 2017-02-15 ENCOUNTER — Other Ambulatory Visit: Payer: Self-pay

## 2017-02-15 ENCOUNTER — Ambulatory Visit: Payer: Self-pay

## 2017-02-21 ENCOUNTER — Ambulatory Visit: Payer: Self-pay | Admitting: Endocrinology

## 2017-03-05 ENCOUNTER — Encounter: Payer: Self-pay | Admitting: Internal Medicine

## 2017-04-17 ENCOUNTER — Emergency Department (HOSPITAL_COMMUNITY)
Admission: EM | Admit: 2017-04-17 | Discharge: 2017-04-18 | Disposition: A | Discharge status: Home / Self Care | Payer: Medicare HMO | Attending: Emergency Medicine | Admitting: Emergency Medicine

## 2017-04-17 ENCOUNTER — Encounter (HOSPITAL_COMMUNITY): Payer: Self-pay | Admitting: Emergency Medicine

## 2017-04-17 DIAGNOSIS — F319 Bipolar disorder, unspecified: Secondary | ICD-10-CM | POA: Diagnosis not present

## 2017-04-17 DIAGNOSIS — Z79899 Other long term (current) drug therapy: Secondary | ICD-10-CM | POA: Insufficient documentation

## 2017-04-17 DIAGNOSIS — Z794 Long term (current) use of insulin: Secondary | ICD-10-CM | POA: Diagnosis not present

## 2017-04-17 DIAGNOSIS — E1165 Type 2 diabetes mellitus with hyperglycemia: Secondary | ICD-10-CM | POA: Diagnosis not present

## 2017-04-17 DIAGNOSIS — J449 Chronic obstructive pulmonary disease, unspecified: Secondary | ICD-10-CM | POA: Insufficient documentation

## 2017-04-17 DIAGNOSIS — I10 Essential (primary) hypertension: Secondary | ICD-10-CM | POA: Insufficient documentation

## 2017-04-17 DIAGNOSIS — F17210 Nicotine dependence, cigarettes, uncomplicated: Secondary | ICD-10-CM | POA: Insufficient documentation

## 2017-04-17 DIAGNOSIS — R443 Hallucinations, unspecified: Secondary | ICD-10-CM | POA: Diagnosis present

## 2017-04-17 DIAGNOSIS — R739 Hyperglycemia, unspecified: Secondary | ICD-10-CM

## 2017-04-17 LAB — CBC WITH DIFFERENTIAL/PLATELET
BASOS PCT: 0 %
Basophils Absolute: 0 10*3/uL (ref 0.0–0.1)
EOS ABS: 0.1 10*3/uL (ref 0.0–0.7)
Eosinophils Relative: 1 %
HEMATOCRIT: 45.8 % (ref 36.0–46.0)
Hemoglobin: 14.8 g/dL (ref 12.0–15.0)
LYMPHS ABS: 2.9 10*3/uL (ref 0.7–4.0)
Lymphocytes Relative: 30 %
MCH: 27.1 pg (ref 26.0–34.0)
MCHC: 32.3 g/dL (ref 30.0–36.0)
MCV: 83.7 fL (ref 78.0–100.0)
MONO ABS: 0.6 10*3/uL (ref 0.1–1.0)
MONOS PCT: 6 %
NEUTROS ABS: 6 10*3/uL (ref 1.7–7.7)
Neutrophils Relative %: 63 %
Platelets: 225 10*3/uL (ref 150–400)
RBC: 5.47 MIL/uL — ABNORMAL HIGH (ref 3.87–5.11)
RDW: 13.6 % (ref 11.5–15.5)
WBC: 9.7 10*3/uL (ref 4.0–10.5)

## 2017-04-17 LAB — BLOOD GAS, VENOUS
ACID-BASE EXCESS: 2.5 mmol/L — AB (ref 0.0–2.0)
BICARBONATE: 28.8 mmol/L — AB (ref 20.0–28.0)
FIO2: 21
O2 Saturation: 37.4 %
PCO2 VEN: 51.7 mmHg (ref 44.0–60.0)
PH VEN: 7.362 (ref 7.250–7.430)
Patient temperature: 97.8

## 2017-04-17 LAB — COMPREHENSIVE METABOLIC PANEL
ALBUMIN: 4.2 g/dL (ref 3.5–5.0)
ALK PHOS: 88 U/L (ref 38–126)
ALT: 20 U/L (ref 14–54)
ANION GAP: 12 (ref 5–15)
AST: 16 U/L (ref 15–41)
BILIRUBIN TOTAL: 0.5 mg/dL (ref 0.3–1.2)
BUN: 18 mg/dL (ref 6–20)
CALCIUM: 10.1 mg/dL (ref 8.9–10.3)
CO2: 26 mmol/L (ref 22–32)
Chloride: 95 mmol/L — ABNORMAL LOW (ref 101–111)
Creatinine, Ser: 1.01 mg/dL — ABNORMAL HIGH (ref 0.44–1.00)
GFR calc Af Amer: >60 mL/min (ref >60)
GFR calc non Af Amer: 57 mL/min — ABNORMAL LOW (ref >60)
GLUCOSE: 462 mg/dL — AB (ref 65–99)
POTASSIUM: 4.6 mmol/L (ref 3.5–5.1)
Sodium: 133 mmol/L — ABNORMAL LOW (ref 135–145)
TOTAL PROTEIN: 8 g/dL (ref 6.5–8.1)

## 2017-04-17 LAB — URINALYSIS, ROUTINE W REFLEX MICROSCOPIC
BILIRUBIN URINE: NEGATIVE
Glucose, UA: >=500 mg/dL — AB
Hgb urine dipstick: NEGATIVE
KETONES UR: NEGATIVE mg/dL
NITRITE: NEGATIVE
PH: 5 (ref 5.0–8.0)
Protein, ur: NEGATIVE mg/dL
SPECIFIC GRAVITY, URINE: 1.026 (ref 1.005–1.030)

## 2017-04-17 LAB — RAPID URINE DRUG SCREEN, HOSP PERFORMED
Amphetamines: NOT DETECTED
BARBITURATES: NOT DETECTED
BENZODIAZEPINES: NOT DETECTED
COCAINE: NOT DETECTED
OPIATES: NOT DETECTED
TETRAHYDROCANNABINOL: NOT DETECTED

## 2017-04-17 LAB — CBG MONITORING, ED: Glucose-Capillary: 435 mg/dL — ABNORMAL HIGH (ref 65–99)

## 2017-04-17 MED ORDER — INSULIN ASPART 100 UNIT/ML ~~LOC~~ SOLN
6.0000 [IU] | Freq: Once | SUBCUTANEOUS | Status: AC
  Administered 2017-04-17: 6 [IU] via SUBCUTANEOUS
  Filled 2017-04-17: qty 1

## 2017-04-17 NOTE — ED Triage Notes (Signed)
Patient here from home brought in by [employer] with hx bipolar and schizophrenia. On a binge and psychosis. Insomnia. Confusion possible dementia. Abusing prescription drugs and other narcotics. Hallucinations, auditory and visual.

## 2017-04-17 NOTE — ED Notes (Signed)
Bed: WA29 Expected date:  Expected time:  Means of arrival:  Comments: 

## 2017-04-17 NOTE — ED Notes (Signed)
Dr Leonette Monarch aware of [facility] orders to follow.

## 2017-04-17 NOTE — ED Provider Notes (Addendum)
[facility] DEPT Provider Note  CSN: [id number] Arrival date & time: 04/17/17 1830  Chief Complaint(s) Medical Clearance  HPI Kathryn Mcguire is a 67 y.o. female with a history of diabetes and bipolar who is brought in by family concern for psychosis.  They report that they believe the patient has been binging on prescription drugs including narcotics.  They believe she is hallucinating.  They also think she is having memory issues.  Patient denies any auditory/visual hallucinations, suicidal ideations, homicidal ideations.  There is no flight of thoughts or euphoria.  Patient denies any drug use.  She denies any recent fevers or infections.  No chest pain or shortness of breath.  No other physical complaints.  Patient does report that she is not always compliant with her medication.   HPI  Past Medical History Past Medical History:  Diagnosis Date  . Arthritis   . Bipolar depression ([place])   . COPD (chronic obstructive pulmonary disease) ([place])   . Diabetes mellitus without complication ([place])   . Elevated LFTs   . Emphysema of lung ([place])   . GERD (gastroesophageal reflux disease)   . History of adenomatous polyps of colon 10/15/2013  . Hyperlipemia   . Hypertension   . UTI (lower urinary tract infection)    Patient Active Problem List   Diagnosis Date Noted  . Essential hypertension 02/04/2017  . Self neglect 02/04/2017  . Acute cystitis without hematuria 01/17/2017  . Breast cancer screening 12/30/2016  . Hypertension 12/21/2016  . Type 2 diabetes mellitus without complications ([place]) 31/54/0086  . Patient's other noncompliance with medication regimen 06/08/2014  . Bipolar 1 disorder, depressed ([place]) 01/18/2014  . Hyperglycemia   . Noncompliance with medication regimen   . Psychiatric illness   . Long-term insulin use ([place]) 01/07/2014  . Vitamin D deficiency 01/06/2014  . History of adenomatous polyps of colon 10/15/2013  . Abnormal LFTs  08/25/2013  . Transaminitis 06/17/2013  . Depression 06/15/2013  . Severe bipolar I disorder, most recent episode mixed, with psychotic features, mood-incongruent ([place]) 06/15/2013  . Dyslipidemia 04/20/2013  . GERD (gastroesophageal reflux disease) 04/20/2013  . Osteoporosis 04/20/2013  . COPD (chronic obstructive pulmonary disease) ([place]) 06/16/2012  . S/P colonoscopic polypectomy 06/16/2012   Home Medication(s) Prior to Admission medications   Medication Sig Start Date End Date Taking? Authorizing Provider  atorvastatin (LIPITOR) 40 MG tablet Take 1 tablet (40 mg total) by mouth daily. Patient not taking: Reported on 02/01/2017 12/28/16   Brunetta Jeans, PA-C  glimepiride (AMARYL) 4 MG tablet Take 1 tablet by mouth every morning. 09/23/16   [provider]  hydroxypropyl methylcellulose / hypromellose (ISOPTO TEARS / GONIOVISC) 2.5 % ophthalmic solution Place 1 drop into both eyes as needed for dry eyes.    [provider]  Insulin Syringe-Needle U-100 (BD VEO INSULIN SYRINGE U/F) 31G X 15/64" 0.5 ML MISC USE AS DIRECTED TWICE DAILY WITH INSULIN 09/23/16   [provider]  lithium carbonate (ESKALITH) 450 MG CR tablet Take 450 mg by mouth 2 (two) times daily.    [provider]  metFORMIN (GLUCOPHAGE-XR) 500 MG 24 hr tablet TAKE ONE TABLET BY MOUTH WITH BREAKFAST AND ONE WITH SUPPER FOR  DIABETES 01/04/17   Brunetta Jeans, PA-C  NOVOLIN 70/30 (70-30) 100 UNIT/ML injection Inject 35 units with breakfast and dinner 01/04/17   Brunetta Jeans, PA-C  Past Surgical History Past Surgical History:  Procedure Laterality Date  . TONSILLECTOMY    . TONSILLECTOMY AND ADENOIDECTOMY    . TUBAL LIGATION     Family History Family History  Problem Relation Age of Onset  . Colon cancer Neg Hx   . Stomach cancer Neg Hx   .  Esophageal cancer Neg Hx   . Rectal cancer Neg Hx     Social History Social History   Tobacco Use  . Smoking status: Current Some Day Smoker    Packs/day: 1.00    Types: Cigarettes  . Smokeless tobacco: Former Systems developer    Types: Chew  Substance Use Topics  . Alcohol use: Yes    Comment: beer  . Drug use: No   Allergies Metformin and related  Review of Systems Review of Systems All other systems are reviewed and are negative for acute change except as noted in the HPI  Physical Exam Vital Signs  I have reviewed the triage vital signs BP 110/70 (BP Location: Right Arm)   Pulse 71   Temp 98 F (36.7 C) (Oral)   Resp 19   SpO2 99%   Physical Exam  Constitutional: She is oriented to person, place, and time. She appears well-developed and well-nourished. No distress.  HENT:  Head: Normocephalic and atraumatic.  Nose: Nose normal.  Eyes: Pupils are equal, round, and reactive to light. Conjunctivae and EOM are normal. Right eye exhibits no discharge. Left eye exhibits no discharge. No scleral icterus.  Neck: Normal range of motion. Neck supple.  Cardiovascular: Normal rate and regular rhythm. Exam reveals no gallop and no friction rub.  No murmur heard. Pulmonary/Chest: Effort normal and breath sounds normal. No stridor. No respiratory distress. She has no rales.  Abdominal: Soft. She exhibits no distension. There is no tenderness.  Musculoskeletal: She exhibits no edema or tenderness.  Neurological: She is alert and oriented to person, place, and time.  Skin: Skin is warm and dry. No rash noted. She is not diaphoretic. No erythema.  Psychiatric: She has a normal mood and affect. Her behavior is normal. Thought content normal. Her speech is not rapid and/or pressured.  Vitals reviewed.   ED Results and Treatments Labs (all labs ordered are listed, but only abnormal results are displayed) Labs Reviewed  CBC WITH DIFFERENTIAL/PLATELET - Abnormal; Notable for the following  components:      Result Value   RBC 5.47 (*)    All other components within normal limits  COMPREHENSIVE METABOLIC PANEL - Abnormal; Notable for the following components:   Sodium 133 (*)    Chloride 95 (*)    Glucose, Bld 462 (*)    Creatinine, Ser 1.01 (*)    GFR calc non Af Amer 57 (*)    All other components within normal limits  URINALYSIS, ROUTINE W REFLEX MICROSCOPIC - Abnormal; Notable for the following components:   APPearance HAZY (*)    Glucose, UA >=500 (*)    Leukocytes, UA TRACE (*)    Bacteria, UA RARE (*)    Squamous Epithelial / LPF 6-30 (*)    All other components within normal limits  BLOOD GAS, VENOUS - Abnormal; Notable for the following components:   Bicarbonate 28.8 (*)    Acid-Base Excess 2.5 (*)    All other components within normal limits  CBG MONITORING, ED - Abnormal; Notable for the following components:   Glucose-Capillary 435 (*)    All other components within normal limits  CBG MONITORING, ED -  Abnormal; Notable for the following components:   Glucose-Capillary 264 (*)    All other components within normal limits  RAPID URINE DRUG SCREEN, HOSP PERFORMED                                                                                                                         EKG  EKG Interpretation  Date/Time:    Ventricular Rate:    PR Interval:    QRS Duration:   QT Interval:    QTC Calculation:   R Axis:     Text Interpretation:        Radiology No results found. Pertinent labs & imaging results that were available during my care of the patient were reviewed by me and considered in my medical decision making (see chart for details).  Medications Ordered in ED Medications  insulin aspart (novoLOG) injection 6 Units (6 Units Subcutaneous Given 04/17/17 2243)                                                                                                                                    Procedures Procedures  (including critical care  time)  Medical Decision Making / ED Course I have reviewed the nursing notes for this encounter and the patient's prior records (if available in EHR or on provided paperwork).    Screening workup with negative UDS.  Did reveal hyperglycemia without evidence of DKA.  Patient provided with oral hydration and subcu insulin with improved hyperglycemia.  Patient does not appear to be a threat to herself or others.  There is no evidence of mania or psychosis.  Recommended patient follow-up with her psychiatrist.  Family reports that she already has an appointment for neuropsych for evaluation of possible dementia.  The patient appears reasonably screened and/or stabilized for discharge and I doubt any other medical condition or other [facility] requiring further screening, evaluation, or treatment in the ED at this time prior to discharge.   Final Clinical Impression(s) / ED Diagnoses Final diagnoses:  Bipolar 1 disorder ([place])  Hyperglycemia    Disposition: Discharge  Condition: Good  I have discussed the results, Dx and Tx plan with the patient who expressed understanding and agree(s) with the plan. Discharge instructions discussed at great length. The patient was given strict return precautions who verbalized understanding of the instructions. No further questions at time of discharge.    ED Discharge Orders  None       Follow Up: Brunetta Jeans, PA-C [address] [place] [address] [phone]  Schedule an appointment as soon as possible for a visit  As needed     This chart was dictated using voice recognition software.  Despite best efforts to proofread,  errors can occur which can change the documentation meaning.     Fatima Blank, MD 04/18/17 Shelah Lewandowsky

## 2017-04-17 NOTE — ED Notes (Signed)
Bed: WLPT3 Expected date:  Expected time:  Means of arrival:  Comments: 

## 2017-04-18 LAB — CBG MONITORING, ED: GLUCOSE-CAPILLARY: 264 mg/dL — AB (ref 65–99)

## 2017-04-18 NOTE — ED Notes (Signed)
Phone call to daughter Glenard Haring informed that mother is being discharged to home and will need transportation. Daughter states she will contact family members to pick her up.

## 2017-04-18 NOTE — ED Notes (Signed)
Pt discharged to home during down time signature pad unavailable

## 2017-04-29 ENCOUNTER — Encounter: Payer: Self-pay | Admitting: Psychology

## 2017-04-29 ENCOUNTER — Ambulatory Visit: Payer: Medicare HMO | Admitting: Psychology

## 2017-04-29 ENCOUNTER — Ambulatory Visit (INDEPENDENT_AMBULATORY_CARE_PROVIDER_SITE_OTHER): Payer: Medicare HMO | Admitting: Psychology

## 2017-04-29 DIAGNOSIS — F319 Bipolar disorder, unspecified: Secondary | ICD-10-CM

## 2017-04-29 DIAGNOSIS — R413 Other amnesia: Secondary | ICD-10-CM

## 2017-04-29 NOTE — Progress Notes (Signed)
NEUROBEHAVIORAL STATUS EXAM   Name: Kathryn Mcguire Date of Birth: 04-19-1950 Date of Interview: 04/29/2017  Reason for Referral:  Kathryn Mcguire is a 67 y.o. female who is referred for neuropsychological evaluation by Kathryn Mcguire, of Kathryn Mcgu[facility] at [facility] due to concerns about cognitive changes. This patient is accompanied in the office by her daughter, Kathryn Mcguire, and granddaughter, Kathryn Mcguire, who supplement the history.  History of Presenting Problem:  Kathryn Mcguire has a history of bipolar I disorder (multiple psychiatric hospitalizations in the past, untreated in recent months/years) and poorly controlled diabetes with recent medication noncompliance. Her family has expressed concerns to her PCP, Kathryn Mcguire, that she has demonstrated decline cognitively and in her ability to Mcguire for herself. They also expressed concerns that she was using alcohol and recreational drugs. The patient has consistently stated that she is doing fine and is not drinking or using drugs. Hgb A1c on 12/28/2016 was 15.1 and cholesterol was severely elevated. She continued to maintain that she was taking her insulin and metformin although family apparently stated that she had multiple unused bottles of insulin. She was also found to have UTI in 12/2016 and was put on antibiotics, unclear if she took these or not. She has been evaluated at the ED at least 3 times in 2019 for hyperglycemia, never admitted. Per phone notes reviewed, her daughter Kathryn Mcguire has reported concerns about the patient's competency (although she has been deemed competent by ED doctors at recent visits). Per phone notes reviewed, Kathryn Mcguire has expressed concerns about the patient leaving food on the stove burning, incontinence of urine and stool, and lack of self hygiene. Per phone notes, Kathryn Mcguire stated that there have been increasing behavioral changes over the past 3 years, escalating in the past year. At recent office visits, the  patient has behaved appropriately, with stable affect, no signs of mania, 29/30 on MMSE.   At today's visit (04/29/2017), the patient continues to deny having any concerns about her memory/cognitive functioning or her ability to manage daily affairs. She reports she independently manages her medications (which she states are only insulin and metformin) according to instructions and does not miss or forget doses. She is not aware that she is prescribed anything for cholesterol. She reports she is not taking anything for bipolar disorder because "I was told I didn't have to take them". She states her psychiatrist at [facility] told her this. She states she last saw him/her about a month ago. She states she manages her bills/finances and appointments without any difficulty. She denies any complaints about her health or physical functioning. She denies any changes in mood. She describes her current mood as "level". She denies symptoms of mania and depression. She denies anxiety. She denies suicidal ideation or intention. She denies sleep difficulty. She denies using any illegal drugs or misusing prescription drugs. She reports drinking "one beer every now and then", never more than one beer in a day. She reports she smokes 4 cigarettes per day. She lives with her ex-husband, daughter (not the one who is here today), and adult granddaughter (the one who is here today).  Her daughter and granddaughter, on the other hand, report concerns about her memory/cognitive functioning. Her daughter feels there was a relatively abrupt onset of cognitive/behavioral change about 8 months ago. That was around the same time that Kathryn Mcguire moved away from the area. Kathryn Mcguire reported that prior to that the patient was taking her medications without any problems. Now her family  says she does not take her medications or monitor her blood sugar. This is their biggest concern. They also note that she has a tendency to leave the stove on and walk  away from cooking. They have noticed a decline her housekeeping and self hygiene. She has to be reminded to bathe and change her clothes. They state they have to help her manage appointments. However, they agree that she manages the bills/finances without any problems, no forgetting to make payments, etc. She has not driven in a long time; she does not have a car. They denied significant forgetfulness (eg for recent conversations or events). They reported she sometimes misplaces things but not very often. They states she "maybe" has difficulty concentrating at times. She does not seem particularly distractible to them. They denied any change in her communication abilities, no word finding difficulty or comprehension problems. They denied any visual-spatial problems or disorientation to place. They denied observing episodes of acute confusion. They denied concerns about change in mood. They agree that her mood is level and she has not had any mania in a long time. They deny any recent psychosis (stating that only occurs when she is manic). Her granddaughter thinks she sleeps a lot. Her granddaughter notes that the patient eats "all the time" and sometimes unusual things, like putting syrup in her lemonade. Her family states that they think she is drinking more alcohol that she admits, but they do not give me any indication of how much they think or have observed her drinking. They also state concerns that she is using illegal drugs but again do not elaborate.    Psychiatric History: The patient has a history of bipolar disorder, unknown when it was first diagnosed. She has previously been treated by psychiatry through [facility] She has a history of prior psychiatric hospitalizations. She has not had any manic episodes, depressive episodes or psychosis in the past year per family. She was previously managed on Lithium, it is unclear when/why this was stopped.   Family History: The patient reported that her  mother had dementia but she is not sure of her age of onset or death. None of the patient's siblings have dementia/memory loss. The patient also denied any family history of bipolar disorder.   Social History: Born/Raised: [place], [place] Education: 1 year of college Occupational history: Retired. Previously Glass blower/designer at [employer] Marital history: Divorced but lives with her ex-husband ("we are just friends").  She has 4 children and 5 grandchildren.  Alcohol: Patient denied history of heavy drinking but this is questionable given discrepancies between patient and family report. Patient reports she has "one beer every now and then", not daily, never more than one beer. However, family reports it is more than this. Tobacco: Patient reports 4 cigarettes/day, but family has reported that she is "chain smoking" cigarettes daily. SA: Patient denies; per record review, family has reported marijuana use. Today family reports concerns about drug use but does not elaborate.   Medical History: Past Medical History:  Diagnosis Date  . Arthritis   . Bipolar depression ([place])   . COPD (chronic obstructive pulmonary disease) ([place])   . Diabetes mellitus without complication ([place])   . Elevated LFTs   . Emphysema of lung ([place])   . GERD (gastroesophageal reflux disease)   . History of adenomatous polyps of colon 10/15/2013  . Hyperlipemia   . Hypertension   . UTI (lower urinary tract infection)      Current Medications:  Outpatient Encounter Medications as  of 04/29/2017  Medication Sig  . atorvastatin (LIPITOR) 40 MG tablet Take 1 tablet (40 mg total) by mouth daily. (Patient not taking: Reported on 02/01/2017)  . glimepiride (AMARYL) 4 MG tablet Take 1 tablet by mouth every morning.  . hydroxypropyl methylcellulose / hypromellose (ISOPTO TEARS / GONIOVISC) 2.5 % ophthalmic solution Place 1 drop into both eyes as needed for dry eyes.  . Insulin Syringe-Needle U-100 (BD VEO INSULIN SYRINGE U/F) 31G X  15/64" 0.5 ML MISC USE AS DIRECTED TWICE DAILY WITH INSULIN  . lithium carbonate (ESKALITH) 450 MG CR tablet Take 450 mg by mouth 2 (two) times daily.  . metFORMIN (GLUCOPHAGE-XR) 500 MG 24 hr tablet TAKE ONE TABLET BY MOUTH WITH BREAKFAST AND ONE WITH SUPPER FOR  DIABETES  . NOVOLIN 70/30 (70-30) 100 UNIT/ML injection Inject 35 units with breakfast and dinner   No facility-administered encounter medications on file as of 04/29/2017.      Behavioral Observations:   Appearance: Casually dressed, minimally reduced grooming. Repetitive, ?involuntary mouth/lip movements observed (?TD) Gait: Ambulated independently, no gross abnormalities observed Speech: Fluent but relatively sparse; normal rate, rhythm and volume. No significant word finding difficulty. Speech is not rapid or pressured.  Thought process: Appears linear. No signs of delusions/hallucinations or loose associations. Affect: Blunted. Does smile/joke occasionally. No sign of irritability. She does not show any emotion or argue against family when they report specific concerns/changes in functioning. Interpersonal: Pleasant, appropriate.    40 minutes spent face-to-face with patient completing neurobehavioral status exam. 60 minutes spent integrating medical records/clinical data and completing this report. CPT codes for this visit: 32440N0 unit; 96121x1 unit.   TESTING: There is medical necessity to proceed with neuropsychological assessment as the results will be used to aid in differential diagnosis and clinical decision-making and to inform specific treatment recommendations. Per the patient's family and medical records reviewed, there has been a change in cognitive functioning and a reasonable suspicion of dementia.  Clinical Decision Making: In considering the patient's current level of functioning, level of presumed impairment, nature of symptoms, emotional and behavioral responses during the interview, level of literacy, and  observed level of motivation, a battery of tests was selected and communicated to the psychometrician.   Following the clinical interview/neurobehavioral status exam, the patient completed this full battery of neuropsychological testing with my psychometrician under my supervision (see separate note).   PLAN: The patient will return to see me for a follow-up session at which time her test performances and my impressions and treatment recommendations will be reviewed in detail.  Evaluation ongoing; full report to follow.

## 2017-04-29 NOTE — Progress Notes (Addendum)
   Neuropsychology Note  Kathryn Mcguire completed 60 minutes of neuropsychological testing with technician, Milana Kidney, BS, under the supervision of Dr. Macarthur Critchley, Licensed Psychologist. The patient did not appear overtly distressed by the testing session, per behavioral observation or via self-report to the technician. Rest breaks were offered.   Clinical Decision Making: In considering the patient's current level of functioning, level of presumed impairment, nature of symptoms, emotional and behavioral responses during the interview, level of literacy, and observed level of motivation/effort, a battery of tests was selected and communicated to the psychometrician.  Communication between the psychologist and technician was ongoing throughout the testing session and changes were made as deemed necessary based on patient performance on testing, technician observations and additional pertinent factors such as those listed above.  Kathryn Mcguire will return within approximately 2 weeks for an interactive feedback session with Dr. Si Raider at which time her test performances, clinical impressions and treatment recommendations will be reviewed in detail. The patient understands she can contact our office should she require our assistance before this time.  15 minutes spent performing neuropsychological evaluation services/clinical decision making (psychologist). [CPT 09643] 60 minutes spent face-to-face with patient administering standardized tests, 30 minutes spent scoring (technician). [CPT Y8200648, 83818]  Full report to follow.

## 2017-05-07 ENCOUNTER — Telehealth: Payer: Self-pay | Admitting: Emergency Medicine

## 2017-05-07 NOTE — Telephone Encounter (Signed)
Will await for the therapist assessment. Her prior UDS here and in the ER (even recently) have all been negative. I cannot speak to her "use" or not of illicit substances since these are all negative but I can write a letter indicating that she is non-adherent with medications and medical recommendations. I will await the letter from her therapist and the results of her neuropsychiatric evaluation with Dr. Si Raider.

## 2017-05-07 NOTE — Telephone Encounter (Signed)
Spoke with patient's daughter Glenard Haring.  She had questions if patient had a urine drug screen which was negative. Patient daughter states She was having an therapist assessment with Dr Burnett Corrente in [place] for evaluation. She wants to have her committed to a Detox facility or Long term facility. She states the patient is worst than before. She was seen on 04/29/17 at Neurologist. Patient's daughter states "her mother is using drugs" She states she has witnesses that has seen her use drugs. She states the patient is using the same drugs her cousin was using and died from. Glenard Haring states they have done [employer]. Patient is still loosing weight, not bathing or taking her medications.  She wanted Einar Pheasant to help with the process. She will have the therapist send [place] the assessment when completed   Copied from [place] [phone]. Topic: General - Other >> May 07, 2017  2:41 PM Carolyn Stare wrote:  Pt  daughter Glenard Haring is calling and would like a call back concerning her Mom     [phone]

## 2017-05-21 NOTE — Progress Notes (Deleted)
NEUROPSYCHOLOGICAL EVALUATION   Name:    Kathryn Mcguire  Date of Birth:   15-Mar-1950 Date of Interview:  04/29/2017 Date of Testing:  04/29/2017   Date of Feedback:  05/22/2017       Background Information:  Reason for Referral:  Kathryn Mcguire is a 67 y.o. female referred by [name], PA-C, of [facility] to assess her current level of cognitive functioning and assist in differential diagnosis. The current evaluation consisted of a review of available medical records, an interview with the patient and her daughter and granddaughter, and the completion of a neuropsychological testing battery. Informed consent was obtained.  History of Presenting Problem:  Kathryn Mcguire has a history of bipolar I disorder (multiple psychiatric hospitalizations in the past, untreated in recent months/years) and poorly controlled diabetes with recent medication noncompliance. Her family has expressed concerns to her PCP, [name], that she has demonstrated decline cognitively and in her ability to care for herself. They also expressed concerns that she was using alcohol and recreational drugs. The patient has consistently stated that she is doing fine and is not drinking or using drugs. Hgb A1c on 12/28/2016 was 15.1 and cholesterol was severely elevated. She continued to maintain that she was taking her insulin and metformin although family apparently stated that she had multiple unused bottles of insulin. She was also found to have UTI in 12/2016 and was put on antibiotics, unclear if she took these or not. She has been evaluated at the ED at least 3 times in 2019 for hyperglycemia, never admitted. Per phone notes reviewed, her daughter Kathryn Mcguire has reported concerns about the patient's competency (although she has been deemed competent by ED doctors at recent visits). Per phone notes reviewed, Kathryn Mcguire has expressed concerns about the patient leaving food on the stove burning, incontinence of urine  and stool, and lack of self hygiene. Per phone notes, Kathryn Mcguire stated that there have been increasing behavioral changes over the past 3 years, escalating in the past year. At recent office visits, the patient has behaved appropriately, with stable affect, no signs of mania, 29/30 on MMSE.   At today's visit (04/29/2017), the patient continues to deny having any concerns about her memory/cognitive functioning or her ability to manage daily affairs. She reports she independently manages her medications (which she states are only insulin and metformin) according to instructions and does not miss or forget doses. She is not aware that she is prescribed anything for cholesterol. She reports she is not taking anything for bipolar disorder because "I was told I didn't have to take them". She states her psychiatrist at [facility] told her this. She states she last saw him/her about a month ago. She states she manages her bills/finances and appointments without any difficulty. She denies any complaints about her health or physical functioning. She denies any changes in mood. She describes her current mood as "level". She denies symptoms of mania and depression. She denies anxiety. She denies suicidal ideation or intention. She denies sleep difficulty. She denies using any illegal drugs or misusing prescription drugs. She reports drinking "one beer every now and then", never more than one beer in a day. She reports she smokes 4 cigarettes per day. She lives with her ex-husband, daughter (not the one who is here today), and adult granddaughter (she is here today).  Her daughter Kathryn Mcguire and granddaughter, on the other hand, report concerns about her memory/cognitive functioning. Her daughter feels there was a relatively abrupt  onset of cognitive/behavioral change about 8 months ago. That was around the same time that Kathryn Mcguire moved away from the area. Kathryn Mcguire reported that prior to that the patient was taking her medications without  any problems. Now her family says she does not take her medications or monitor her blood sugar. This is their biggest concern. They also note that she has a tendency to leave the stove on and walk away from cooking. They have noticed a decline her housekeeping and self hygiene. She has to be reminded to bathe and change her clothes. They state they have to help her manage appointments. However, they agree that she manages the bills/finances without any problems, no forgetting to make payments, etc. She has not driven in a long time; she does not have a car. They denied significant forgetfulness (eg for recent conversations or events). They reported she sometimes misplaces things but not very often. They state she "maybe" has difficulty concentrating at times. She does not seem particularly distractible to them. They denied any change in her communication abilities, no word finding difficulty or comprehension problems. They denied any visual-spatial problems or disorientation to place. They denied observing episodes of acute confusion. They denied concerns about change in mood. They agree that her mood is level and she has not had any mania in a long time. They deny any recent psychosis (stating that only occurs when she is manic). Her granddaughter thinks she sleeps a lot. Her granddaughter notes that the patient eats "all the time" and sometimes unusual things, like putting syrup in her lemonade. Her family states that they think she is drinking more alcohol that she admits, but they do not give me any indication of how much they think or have observed her drinking. They also state concerns that she is using illegal drugs but again do not elaborate.    Psychiatric History: The patient has a history of bipolar disorder, unknown when it was first diagnosed. She has previously been treated by psychiatry through [facility]. She has a history of prior psychiatric hospitalizations. She has not had any manic episodes,  depressive episodes or psychosis in the past year per family. She was previously managed on Lithium, it is unclear when/why this was stopped.   Family History: The patient reported that her mother had dementia but she is not sure of her age of onset or death. None of the patient's siblings have dementia/memory loss. The patient also denied any family history of bipolar disorder.   Social History: Born/Raised: [place], [place] Education: 1 year of college Occupational history: Retired. Previously office manager at [employer] Marital history: Divorced but lives with her ex-husband ("we are just friends").  She has 4 children and 5 grandchildren.  Alcohol: Patient denied history of heavy drinking but this is questionable given discrepancies between patient and family report. Patient reports she has "one beer every now and then", not daily, never more than one beer. However, family reports it is more than this. Tobacco: Patient reports 4 cigarettes/day, but family has reported that she is "chain smoking" cigarettes daily. SA: Patient denies; per record review, family has reported marijuana use. Today family reports concerns about drug use but does not elaborate.    Medical History:  Past Medical History:  Diagnosis Date  . Arthritis   . Bipolar depression (HCC)   . COPD (chronic obstructive pulmonary disease) (HCC)   . Diabetes mellitus without complication (HCC)   . Elevated LFTs   . Emphysema of lung (HCC)   .   GERD (gastroesophageal reflux disease)   . History of adenomatous polyps of colon 10/15/2013  . Hyperlipemia   . Hypertension   . UTI (lower urinary tract infection)     Current medications:  Outpatient Encounter Medications as of 05/22/2017  Medication Sig  . atorvastatin (LIPITOR) 40 MG tablet Take 1 tablet (40 mg total) by mouth daily. (Patient not taking: Reported on 02/01/2017)  . glimepiride (AMARYL) 4 MG tablet Take 1 tablet by mouth every morning.  . hydroxypropyl  methylcellulose / hypromellose (ISOPTO TEARS / GONIOVISC) 2.5 % ophthalmic solution Place 1 drop into both eyes as needed for dry eyes.  . Insulin Syringe-Needle U-100 (BD VEO INSULIN SYRINGE U/F) 31G X 15/64" 0.5 ML MISC USE AS DIRECTED TWICE DAILY WITH INSULIN  . lithium carbonate (ESKALITH) 450 MG CR tablet Take 450 mg by mouth 2 (two) times daily.  . metFORMIN (GLUCOPHAGE-XR) 500 MG 24 hr tablet TAKE ONE TABLET BY MOUTH WITH BREAKFAST AND ONE WITH SUPPER FOR  DIABETES  . NOVOLIN 70/30 (70-30) 100 UNIT/ML injection Inject 35 units with breakfast and dinner   No facility-administered encounter medications on file as of 05/22/2017.      Current Examination:  Behavioral Observations:  Appearance: Casually dressed, minimally reduced grooming. Repetitive, ?involuntary mouth/lip movements observed (?TD) Gait: Ambulated independently, no gross abnormalities observed Speech: Fluent but relatively sparse; normal rate, rhythm and volume. No significant word finding difficulty. Speech is not rapid or pressured.  Thought process: Appears linear. No signs of delusions/hallucinations or loose associations. Affect: Blunted. Does smile/joke occasionally. No sign of irritability. She does not show any emotion or argue against family when they report specific concerns/changes in functioning. Interpersonal: Pleasant, appropriate.  Orientation: Oriented to person, place, and current month and year. Disoriented to current age 67"), date ("3rd" on the 22nd), and day of the week (one day off). Accurately named the current President and his predecessor.   Tests Administered: . Test of Premorbid Functioning (TOPF) . Wechsler Adult Intelligence Scale-Fourth Edition (WAIS-IV): Similarities, Music therapist, Coding and Digit Span subtests . Wechsler Memory Scale-Fourth Edition (WMS-IV) Older Adult Version (ages 50-90): Logical Memory I, II and Recognition subtests  . [employer] Verbal Learning Test - 2nd Edition  (CVLT-2) Short Form . Repeatable Battery for the Assessment of Neuropsychological Status (RBANS) Form A:  Figure Copy and Recall subtests and Semantic Fluency subtest . Neuropsychological Assessment Battery (NAB) Language Module, Form 1: Naming subtest . Boston Diagnostic Aphasia Examination: Complex Ideational Material subtest . Controlled Oral Word Association Test (COWAT) . Trail Making Test A and B . Clock drawing test . Verbal Reasoning Cognitive Competency . Beck Depression Inventory - 2nd Edition (BDI-II) . Generalized Anxiety Disorder - 7 item screener (GAD-7)  Test Results: Note: Standardized scores are presented only for use by appropriately trained professionals and to allow for any future test-retest comparison. These scores should not be interpreted without consideration of all the information that is contained in the rest of the report. The most recent standardization samples from the test publisher or other sources were used whenever possible to derive standard scores; scores were corrected for age, gender, ethnicity and education when available.   Test Scores:  ***  Description of Test Results:  Premorbid verbal intellectual abilities were estimated to have been within the average range based on a test of word reading. Psychomotor processing speed was high average. Auditory attention and working memory were average. Visual-spatial construction was severely impaired. Language abilities were somewhat variable. Specifically, confrontation naming was low average,  and semantic verbal fluency ranged from average for animals to borderline for fruits/vegetables. Auditory comprehension of complex ideational material was below expectation. With regard to verbal memory, encoding and acquisition of non-contextual information (i.e., word list) was severely impaired. After a brief distracter task, free recall was impaired (3/9 items). After a delay, free recall was impaired (2/9 items). Cued  recall was impaired (3/9 items). Performance on a yes/no recognition task was average. On another verbal memory test, encoding and acquisition of contextual auditory information (i.e., short stories) was borderline. After a delay, free recall was impaired. Performance on a yes/no recognition task was within normal limits. With regard to non-verbal memory, delayed free recall of visual information was borderline. Executive functioning was variable but there were several performances in the impaired range. Mental flexibility and set-shifting were impaired; she was unable to complete [place]. Verbal fluency with phonemic search restrictions was low average. Verbal abstract reasoning was impaired. Performance on a clock drawing task was impaired. On a test of reasoning abilities for hypothetical problems that could occur in everyday life, she demonstrated mostly normal responses but evidence of some reduced judgment (e.g., when told to imagine it is a cold winter day and the streets are very slippery, but she is out of bread and fresh food, and then asked what to do, she responded "walk to the store"). On self-report measures of mood, the patient's responses were not indicative of clinically significant depression at the present time; however, validity of responses is questionable as it did not appear she fully read the questions or gave thoughtful responses.   Clinical Impressions: Mild to moderate dementia, unspecified. Bipolar disorder (by history). On cognitive testing, significant impairments were demonstrated in temporal orientation, visual-spatial construction, memory encoding/retrieval (although recognition was relatively intact), and executive functioning (mental flexibility/set-shifting, reasoning, clock drawing). Additionally, there is evidence that her cognitive deficits are interfering with her ability to manage IADLs/ADLs. Therefore, diagnostic criteria for a dementia syndrome are met. She does not  appear to be in a state of delirium. Her cognitive profile on testing is reflective of both cortical and subcortical features. Consistent with cortical involvement, visual-spatial construction was severely impaired. Consistent with disruption of frontal-subcortical networks, memory encoding/retrieval and executive functioning were impaired.  Etiology of dementia is unclear. Determination of etiology is limited by discrepancies/gaps in informant report along with lack of reliable self-report. Determination of etiology is further complicated by psychiatric history and recent noncompliance with all medical/psychiatric treatments as well as possible substance abuse. Given these factors, it is impossible to tell if there is an underlying neurodegenerative process versus reversible dementia/encephalopathy. Regardless, her severely uncontrolled diabetes and hyperglycemia is likely a major contributing factor to current cognitive impairment.  While she is currently unmedicated for bipolar disorder, she denies any depression or mania, and her family agrees there has not been evidence of change in mood.     Recommendations/Plan: Based on the findings of the present evaluation, the following recommendations are offered:  1. The patient's family may wish to pursue guardianship. I do suspect that the patient is lacking capacity, due to cognitive impairment and especially impaired reasoning ability, to make medical decisions and manage her health. She states that she is taking her medications and following medical advice when this is clearly not the case. It would be different if she was stating she is choosing not to take her medications and had a reason for that, and could appreciate the advantages and disadvantages of doing so.  2. Obviously optimal  control of diabetes, cessation of smoking/alcohol/drug use, and compliance with medical recommendations is recommended, but without guardianship I do not see a change in  the patient's behavior occurring.  3. Brain MRI is highly recommended to assist with differential diagnosis.  4. Once the patient is stabilized from a medical standpoint we could do neurocognitive re-evaluation to help determine if there is underlying neurodegenerative dementia.    Feedback to Patient: Kathryn Mcguire returned for a feedback appointment on 05/22/2017 to review the results of her neuropsychological evaluation with this provider. *** minutes face-to-face time was spent reviewing her test results, my impressions and my recommendations as detailed above.    Total time spent on this patient's case: 100 minutes for neurobehavioral status exam with psychologist (CPT code [phone], [phone]); 90 minutes of testing/scoring by psychometrician under psychologist's supervision (CPT codes [phone], [phone] units); 180 minutes for integration of patient data, interpretation of standardized test results and clinical data, clinical decision making, treatment planning and preparation of this report, and interactive feedback with review of results to the patient/family by psychologist (CPT codes [phone], [phone] units).      Thank you for your referral of Kathryn Mcguire. Please feel free to contact me if you have any questions or concerns regarding this report.

## 2017-05-22 ENCOUNTER — Encounter: Payer: Self-pay | Admitting: Psychology

## 2017-05-23 ENCOUNTER — Encounter: Payer: Self-pay | Admitting: Psychology

## 2017-06-17 NOTE — Progress Notes (Signed)
NEUROPSYCHOLOGICAL EVALUATION   Name:    Kathryn Mcguire  Date of Birth:   Jan 21, 1950 Date of Interview:  04/29/2017 Date of Testing:  04/29/2017   Date of Feedback:  06/20/2017       Background Information:  Reason for Referral:  Kathryn Mcguire is a 67 y.o. female referred by [name], PA-C, of [facility] to assess her current level of cognitive functioning and assist in differential diagnosis. The current evaluation consisted of a review of available medical records, an interview with the patient and her daughter and granddaughter, and the completion of a neuropsychological testing battery. Informed consent was obtained.  History of Presenting Problem:  Kathryn Mcguire has a history of bipolar I disorder (multiple psychiatric hospitalizations in the past, untreated in recent months/years) and poorly controlled diabetes with recent medication noncompliance. Her family has expressed concerns to her PCP, [name], that she has demonstrated decline cognitively and in her ability to care for herself. They also expressed concerns that she was using alcohol and recreational drugs. The patient has consistently stated that she is doing fine and is not drinking or using drugs. Hgb A1c on 12/28/2016 was 15.1 and cholesterol was severely elevated. She continued to maintain that she was taking her insulin and metformin although family apparently stated that she had multiple unused bottles of insulin. She was also found to have UTI in 12/2016 and was put on antibiotics, unclear if she took these or not. She has been evaluated at the ED at least 3 times in 2019 for hyperglycemia, never admitted. Per phone notes reviewed, her daughter Kathryn Mcguire has reported concerns about the patient's competency (although she has been deemed competent by ED doctors at recent visits). Per phone notes reviewed, Kathryn Mcguire has expressed concerns about the patient leaving food on the stove burning, incontinence of urine and  stool, and lack of self hygiene. Per phone notes, Kathryn Mcguire stated that there have been increasing behavioral changes over the past 3 years, escalating in the past year. At recent office visits, the patient has behaved appropriately, with stable affect, no signs of mania, 29/30 on MMSE.    At today's visit (04/29/2017), the patient continues to deny having any concerns about her memory/cognitive functioning or her ability to manage daily affairs. She reports she independently manages her medications (which she states are only insulin and metformin) according to instructions and does not miss or forget doses. She is not aware that she is prescribed anything for cholesterol. She reports she is not taking anything for bipolar disorder because "I was told I didn't have to take them". She states her psychiatrist at [facility] told her this. She states she last saw him/her about a month ago. She states she manages her bills/finances and appointments without any difficulty. She denies any complaints about her health or physical functioning. She denies any changes in mood. She describes her current mood as "level". She denies symptoms of mania and depression. She denies anxiety. She denies suicidal ideation or intention. She denies sleep difficulty. She denies using any illegal drugs or misusing prescription drugs. She reports drinking "one beer every now and then", never more than one beer in a day. She reports she smokes 4 cigarettes per day. She lives with her ex-husband, daughter (not the one who is here today), and adult granddaughter (she is here today).   Her daughter Kathryn Mcguire and granddaughter, on the other hand, report concerns about her memory/cognitive functioning. Her daughter feels there was a relatively  abrupt onset of cognitive/behavioral change about 8 months ago. That was around the same time that Kathryn Mcguire moved away from the area. Kathryn Mcguire reported that prior to that the patient was taking her medications without any  problems. Now her family says she does not take her medications or monitor her blood sugar. This is their biggest concern. They also note that she has a tendency to leave the stove on and walk away from cooking. They have noticed a decline her housekeeping and self hygiene. She has to be reminded to bathe and change her clothes. They state they have to help her manage appointments. However, they agree that she manages the bills/finances without any problems, no forgetting to make payments, etc. She has not driven in a long time; she does not have a car. They denied significant forgetfulness (eg for recent conversations or events). They reported she sometimes misplaces things but not very often. They state she "maybe" has difficulty concentrating at times. She does not seem particularly distractible to them. They denied any change in her communication abilities, no word finding difficulty or comprehension problems. They denied any visual-spatial problems or disorientation to place. They denied observing episodes of acute confusion. They denied concerns about change in mood. They agree that her mood is level and she has not had any mania in a long time. They deny any recent psychosis (stating that only occurs when she is manic). Her granddaughter thinks she sleeps a lot. Her granddaughter notes that the patient eats "all the time" and sometimes unusual things, like putting syrup in her lemonade. Her family states that they think she is drinking more alcohol that she admits, but they do not give me any indication of how much they think or have observed her drinking. They also state concerns that she is using illegal drugs but again do not elaborate.      Psychiatric History: The patient has a history of bipolar disorder, unknown when it was first diagnosed. She has previously been treated by psychiatry through [facility]. She has a history of prior psychiatric hospitalizations. She has not had any manic episodes,  depressive episodes or psychosis in the past year per family. She was previously managed on Lithium, it is unclear when/why this was stopped.    Family History: The patient reported that her mother had dementia but she is not sure of her age of onset or death. None of the patient's siblings have dementia/memory loss. The patient also denied any family history of bipolar disorder.     Social History: Born/Raised: [place], [place] Education: 1 year of college Occupational history: Retired. Previously Glass blower/designer at [employer] Marital history: Divorced but lives with her ex-husband ("we are just friends").  She has 4 children and 5 grandchildren.  Alcohol: Patient denied history of heavy drinking but this is questionable given discrepancies between patient and family report. Patient reports she has "one beer every now and then", not daily, never more than one beer. However, family reports it is more than this. Tobacco: Patient reports 4 cigarettes/day, but family has reported that she is "chain smoking" cigarettes daily. SA: Patient denies; per record review, family has reported marijuana use. Today family reports concerns about drug use but does not elaborate.   Medical History:  Past Medical History:  Diagnosis Date  . Arthritis   . Bipolar depression ([place])   . COPD (chronic obstructive pulmonary disease) ([place])   . Diabetes mellitus without complication ([place])   . Elevated LFTs   . Emphysema of  lung ([place])   . GERD (gastroesophageal reflux disease)   . History of adenomatous polyps of colon 10/15/2013  . Hyperlipemia   . Hypertension   . UTI (lower urinary tract infection)     Current medications:  Outpatient Encounter Medications as of 06/20/2017  Medication Sig  . atorvastatin (LIPITOR) 40 MG tablet Take 1 tablet (40 mg total) by mouth daily. (Patient not taking: Reported on 02/01/2017)  . glimepiride (AMARYL) 4 MG tablet Take 1 tablet by mouth every morning.  . hydroxypropyl  methylcellulose / hypromellose (ISOPTO TEARS / GONIOVISC) 2.5 % ophthalmic solution Place 1 drop into both eyes as needed for dry eyes.  . Insulin Syringe-Needle U-100 (BD VEO INSULIN SYRINGE U/F) 31G X 15/64" 0.5 ML MISC USE AS DIRECTED TWICE DAILY WITH INSULIN  . lithium carbonate (ESKALITH) 450 MG CR tablet Take 450 mg by mouth 2 (two) times daily.  . metFORMIN (GLUCOPHAGE-XR) 500 MG 24 hr tablet TAKE ONE TABLET BY MOUTH WITH BREAKFAST AND ONE WITH SUPPER FOR  DIABETES  . NOVOLIN 70/30 (70-30) 100 UNIT/ML injection Inject 35 units with breakfast and dinner   No facility-administered encounter medications on file as of 06/20/2017.     Current Examination:  Behavioral Observations:  Appearance: Casually dressed, minimally reduced grooming. Repetitive, ?involuntary mouth/lip movements observed (?TD) Gait: Ambulated independently, no gross abnormalities observed Speech: Fluent but relatively sparse; normal rate, rhythm and volume. No significant word finding difficulty. Speech is not rapid or pressured.  Thought process: Appears linear. No signs of delusions/hallucinations or loose associations. Affect: Blunted. Does smile/joke occasionally. No sign of irritability. She does not show any emotion or argue against family when they report specific concerns/changes in functioning. Interpersonal: Pleasant, appropriate.  Orientation: Oriented to person, place, and current month and year. Disoriented to current age 67"), date ("3rd" on the 22nd), and day of the week (one day off). Accurately named the current President and his predecessor.   Tests Administered: . Test of Premorbid Functioning (TOPF) . Wechsler Adult Intelligence Scale-Fourth Edition (WAIS-IV): Similarities, Music therapist, Coding and Digit Span subtests . Wechsler Memory Scale-Fourth Edition (WMS-IV) Older Adult Version (ages 76-90): Logical Memory I, II and Recognition subtests  . [employer] Verbal Learning Test - 2nd Edition  (CVLT-2) Short Form . Repeatable Battery for the Assessment of Neuropsychological Status (RBANS) Form A:  Figure Copy and Recall subtests and Semantic Fluency subtest . Neuropsychological Assessment Battery (NAB) Language Module, Form 1: Naming subtest . Boston Diagnostic Aphasia Examination: Complex Ideational Material subtest . Controlled Oral Word Association Test (COWAT) . Trail Making Test A and B . Clock drawing test . Verbal Reasoning Cognitive Competency . Beck Depression Inventory - 2nd Edition (BDI-II) . Generalized Anxiety Disorder - 7 item screener (GAD-7)  Test Results: Note: Standardized scores are presented only for use by appropriately trained professionals and to allow for any future test-retest comparison. These scores should not be interpreted without consideration of all the information that is contained in the rest of the report. The most recent standardization samples from the test publisher or other sources were used whenever possible to derive standard scores; scores were corrected for age, gender, ethnicity and education when available.   Test Scores:  Test Name Raw Score Standardized Score Descriptor  TOPF 33/70 SS= 93 Average  WAIS-IV Subtests     Similarities 10/36 ss= 4 Impaired  Block Design 4/66 ss= 2 Impaired  Coding 65/135 ss= 12 High average  Digit Span Forward 11/16 ss= 11 Average  Digit Span Backward 6/16  ss= 8 Average  WMS-IV Subtests     LM I 17/53 ss= 5 Borderline  LM II 4/39 ss= 3 Impaired  LM II Recognition 17/23 Cum %: 17-25   RBANS Subtests     Figure Copy 11/20 Z= -4.2 Severely impaired  Figure Recall 6/20 Z= -1.9 Borderline  Semantic Fluency 13 Z= -1.7 Borderline  CVLT-II Scores     Trial 1 2/9 Z= -3 Severely impaired  Trial 4 4/9 Z= -2.5 Impaired  Trials 1-4 total 14/36 T= 19 Severely impaired  SD Free Recall 3/9 Z= -2.5 Impaired  LD Free Recall 2/9 Z= -2 Impaired  LD Cued Recall 3/9 Z= -2.5 Impaired  Recognition Discriminability  7/9 hits, 0 false positives Z= -0.5 Average  Forced Choice Recognition 9/9  WNL  NAB Naming 28/31 T= 38 Low average  BDAE Complex Ideational Material 5/6  Below expectation  COWAT-FAS 28 T= 38 Low average  COWAT-Animals 17 T= 47 Average  Trail Making Test A  62" 1 error T= 28 Impaired  Trail Making Test B  Pt unable   Severely impaired  Clock Drawing   Impaired  Verbal Reasoning Cognitive Competency 16/20  Low average to average  BDI-II 0/63  (6 questions left blank)  WNL  GAD-7 0/21  WNL      Description of Test Results:  Premorbid verbal intellectual abilities were estimated to have been within the average range based on a test of word reading. Psychomotor processing speed was high average. Auditory attention and working memory were average. Visual-spatial construction was severely impaired. Language abilities were somewhat variable. Specifically, confrontation naming was low average, and semantic verbal fluency ranged from average for animals to borderline for fruits/vegetables. Auditory comprehension of complex ideational material was below expectation. With regard to verbal memory, encoding and acquisition of non-contextual information (i.e., word list) was severely impaired. After a brief distracter task, free recall was impaired (3/9 items). After a delay, free recall was impaired (2/9 items). Cued recall was impaired (3/9 items). Performance on a yes/no recognition task was average. On another verbal memory test, encoding and acquisition of contextual auditory information (i.e., short stories) was borderline. After a delay, free recall was impaired. Performance on a yes/no recognition task was within normal limits. With regard to non-verbal memory, delayed free recall of visual information was borderline. Executive functioning was variable but there were several performances in the impaired range. Mental flexibility and set-shifting were impaired; she was unable to complete Trails [address].  Verbal fluency with phonemic search restrictions was low average. Verbal abstract reasoning was impaired. Performance on a clock drawing task was impaired. On a test of reasoning abilities for hypothetical problems that could occur in everyday life, she demonstrated mostly normal responses but evidence of some reduced judgment (e.g., when told to imagine it is a cold winter day and the streets are very slippery, but she is out of bread and fresh food, and then asked what to do, she responded "walk to the store"). On self-report measures of mood, the patient's responses were not indicative of clinically significant depression at the present time; however, validity of responses is questionable as it did not appear she fully read the questions or gave thoughtful responses.   Clinical Impressions: Mild to moderate dementia, unspecified. Bipolar disorder (by history). On cognitive testing, significant impairments were demonstrated in temporal orientation, visual-spatial construction, memory encoding/retrieval (although recognition was relatively intact), and executive functioning (mental flexibility/set-shifting, reasoning, clock drawing). Additionally, there is evidence that her cognitive deficits are interfering  with her ability to manage IADLs/ADLs. Therefore, diagnostic criteria for a dementia syndrome are met. She does not appear to be in a state of delirium. Her cognitive profile on testing is reflective of both cortical and subcortical features. Consistent with cortical involvement, visual-spatial construction was severely impaired. Consistent with disruption of frontal-subcortical networks, memory encoding/retrieval and executive functioning were impaired.  Etiology of dementia is unclear. Determination of etiology is limited by discrepancies/gaps in informant report along with lack of reliable self-report. Determination of etiology is further complicated by psychiatric history and recent noncompliance with  all medical/psychiatric treatments as well as possible substance abuse. Given these factors, it is impossible to tell if there is an underlying neurodegenerative process versus reversible dementia/encephalopathy. Regardless, her severely uncontrolled diabetes and hyperglycemia is likely a major contributing factor to current cognitive impairment.  While she is currently unmedicated for bipolar disorder, she denies any depression or mania, and her family agrees there has not been evidence of change in mood.     Recommendations/Plan: Based on the findings of the present evaluation, the following recommendations are offered:  1. The patient's family may wish to pursue guardianship. I do suspect that the patient is lacking capacity, due to cognitive impairment and especially impaired reasoning ability, to make medical decisions and manage her health. She states that she is taking her medications and following medical advice when this is clearly not the case. It would be different if she was stating she is choosing not to take her medications and had a reason for that, and could appreciate the advantages and disadvantages of doing so.  2. Obviously optimal control of diabetes, along with no smoking/alcohol/drug use, and compliance with medical recommendations is recommended, but without guardianship I do not see a change in the patient's behavior occurring.  3. Brain MRI is highly recommended to assist with differential diagnosis.  4. Once the patient is stabilized from a medical standpoint we could do neurocognitive re-evaluation to help determine if there is underlying neurodegenerative dementia.    Feedback to Patient: Kathryn Mcguire and her daughter returned for a feedback appointment on 06/20/2017 to review the results of her neuropsychological evaluation with this provider. 25 minutes face-to-face time was spent reviewing her test results, my impressions and my recommendations as detailed above. A  copy of this report was provided to the patient/family.   Total time spent on this patient's case: 100 minutes for neurobehavioral status exam with psychologist (CPT code [phone], [phone]); 90 minutes of testing/scoring by psychometrician under psychologist's supervision (CPT codes [phone], [phone] units); 180 minutes for integration of patient data, interpretation of standardized test results and clinical data, clinical decision making, treatment planning and preparation of this report, and interactive feedback with review of results to the patient/family by psychologist (CPT codes [phone], [phone] units).      Thank you for your referral of Kathryn Mcguire. Please feel free to contact me if you have any questions or concerns regarding this report.

## 2017-06-20 ENCOUNTER — Ambulatory Visit (INDEPENDENT_AMBULATORY_CARE_PROVIDER_SITE_OTHER): Payer: Medicare HMO | Admitting: Psychology

## 2017-06-20 ENCOUNTER — Encounter: Payer: Self-pay | Admitting: Psychology

## 2017-06-20 DIAGNOSIS — F0391 Unspecified dementia with behavioral disturbance: Secondary | ICD-10-CM

## 2017-06-21 ENCOUNTER — Telehealth: Payer: Self-pay | Admitting: Emergency Medicine

## 2017-06-21 DIAGNOSIS — F0391 Unspecified dementia with behavioral disturbance: Secondary | ICD-10-CM

## 2017-06-21 NOTE — Telephone Encounter (Signed)
Please advise   Copied from [place] [phone]. Topic: Inquiry >> Jun 20, 2017  4:09 PM Mylinda Latina, NT wrote: Reason for CRM: Patient daughter Glenard Haring called and states the Neuro Psychologist sent over a report today . The daughter would like to be in the loop about the date and time the brain scan will be. She will be the patient transportation to this appt.  She is requesting a call back Cb# [phone]

## 2017-06-21 NOTE — Telephone Encounter (Signed)
Reviewed consult notes and testing results from Dr. Si Raider. We should be able to get MRI covered for Kathryn Mcguire now. I have placed a new order for MRI Brain WO contrast. They will call Levada Dy to schedule for Kathryn Mcguire. This will help [place] determine if there is something structural contributing to the dementia or if it is more so related to her uncontrolled diabetes. Now that we have the proper documentation, Ms Lorra Mcguire may want to consider seeking guardianship of Ms. Malson.

## 2017-06-24 NOTE — Telephone Encounter (Signed)
Advised patient daughter Glenard Haring of recent testing results and documentation from [name] should help get the MRI covered. Will have the schedulers work on her getting scheduled. Once we get the results back from the MRI will work on getting the guardianship. She is agreeable.

## 2017-06-27 ENCOUNTER — Telehealth: Payer: Self-pay | Admitting: Physician Assistant

## 2017-06-27 ENCOUNTER — Other Ambulatory Visit: Payer: Self-pay | Admitting: Physician Assistant

## 2017-06-27 NOTE — Telephone Encounter (Signed)
Copied from [place] [phone]. Topic: Quick Communication - See Telephone Encounter >> Jun 27, 2017  2:14 PM Vernona Rieger wrote: CRM for notification. See Telephone encounter for: 06/27/17.   Ena Dawley from [facility] is needing a Prior Autho for the patient to have brain MRI without contrast. The appointment is 07/03/17. The code is [phone] MRI of the brain. [employer] insurance.  Call back @ [phone]

## 2017-06-28 NOTE — Telephone Encounter (Signed)
Please call [facility] to see if the order needs to be changed to w contrast or w/ and w/o contrast instead.

## 2017-06-28 NOTE — Telephone Encounter (Signed)
Spoke with scheduler and spoke with imaging yesterday and fixed the order

## 2017-07-03 ENCOUNTER — Ambulatory Visit
Admission: RE | Admit: 2017-07-03 | Discharge: 2017-07-03 | Disposition: A | Discharge status: Home / Self Care | Payer: Medicare HMO | Source: Ambulatory Visit | Attending: Physician Assistant | Admitting: Physician Assistant

## 2017-07-03 ENCOUNTER — Other Ambulatory Visit: Payer: Self-pay | Admitting: Physician Assistant

## 2017-07-03 ENCOUNTER — Telehealth: Payer: Self-pay | Admitting: Physician Assistant

## 2017-07-03 DIAGNOSIS — F0391 Unspecified dementia with behavioral disturbance: Secondary | ICD-10-CM | POA: Diagnosis not present

## 2017-07-03 NOTE — Telephone Encounter (Signed)
I will call [employer].

## 2017-07-03 NOTE — Telephone Encounter (Signed)
Per Estill Bamberg, prior auth was needed for MRI. auth # [id number], call ref #[id number]. I have called [facility] and made Dorian Pod aware this had been done and to let [place] know.

## 2017-07-03 NOTE — Telephone Encounter (Signed)
Copied from [place] [phone]. Topic: Quick Communication - See Telephone Encounter >> Jul 03, 2017  9:09 AM Ahmed Prima L wrote: CRM for notification. See Telephone encounter for: 07/03/17.  Ena Dawley from [facility] called and said she is coming in today for a brain MRI @ 1pm. She said that she has contacted [place] insurance twice and they advised her a prior autho is required.  CPT Code [phone] Call back [phone] Ena Dawley )

## 2017-08-15 ENCOUNTER — Encounter: Payer: Self-pay | Admitting: Physician Assistant

## 2017-08-15 ENCOUNTER — Telehealth: Payer: Self-pay | Admitting: Emergency Medicine

## 2017-08-15 ENCOUNTER — Encounter (HOSPITAL_COMMUNITY): Payer: Self-pay

## 2017-08-15 ENCOUNTER — Emergency Department (HOSPITAL_COMMUNITY)
Admission: EM | Admit: 2017-08-15 | Discharge: 2017-08-16 | Disposition: A | Discharge status: Home / Self Care | Payer: Medicare HMO | Attending: Emergency Medicine | Admitting: Emergency Medicine

## 2017-08-15 ENCOUNTER — Other Ambulatory Visit: Payer: Self-pay

## 2017-08-15 DIAGNOSIS — F329 Major depressive disorder, single episode, unspecified: Secondary | ICD-10-CM | POA: Diagnosis present

## 2017-08-15 DIAGNOSIS — R739 Hyperglycemia, unspecified: Secondary | ICD-10-CM | POA: Insufficient documentation

## 2017-08-15 DIAGNOSIS — N390 Urinary tract infection, site not specified: Secondary | ICD-10-CM | POA: Diagnosis not present

## 2017-08-15 DIAGNOSIS — Z046 Encounter for general psychiatric examination, requested by authority: Secondary | ICD-10-CM | POA: Diagnosis not present

## 2017-08-15 DIAGNOSIS — F17210 Nicotine dependence, cigarettes, uncomplicated: Secondary | ICD-10-CM | POA: Diagnosis not present

## 2017-08-15 DIAGNOSIS — J449 Chronic obstructive pulmonary disease, unspecified: Secondary | ICD-10-CM | POA: Insufficient documentation

## 2017-08-15 DIAGNOSIS — F32A Depression, unspecified: Secondary | ICD-10-CM | POA: Diagnosis present

## 2017-08-15 DIAGNOSIS — F0151 Vascular dementia with behavioral disturbance: Secondary | ICD-10-CM | POA: Insufficient documentation

## 2017-08-15 DIAGNOSIS — E119 Type 2 diabetes mellitus without complications: Secondary | ICD-10-CM | POA: Diagnosis not present

## 2017-08-15 DIAGNOSIS — Z7689 Persons encountering health services in other specified circumstances: Secondary | ICD-10-CM | POA: Diagnosis not present

## 2017-08-15 DIAGNOSIS — I10 Essential (primary) hypertension: Secondary | ICD-10-CM | POA: Diagnosis not present

## 2017-08-15 DIAGNOSIS — F320 Major depressive disorder, single episode, mild: Secondary | ICD-10-CM | POA: Insufficient documentation

## 2017-08-15 DIAGNOSIS — Z794 Long term (current) use of insulin: Secondary | ICD-10-CM | POA: Insufficient documentation

## 2017-08-15 DIAGNOSIS — F3164 Bipolar disorder, current episode mixed, severe, with psychotic features: Secondary | ICD-10-CM | POA: Diagnosis not present

## 2017-08-15 DIAGNOSIS — E1165 Type 2 diabetes mellitus with hyperglycemia: Secondary | ICD-10-CM | POA: Diagnosis not present

## 2017-08-15 LAB — BASIC METABOLIC PANEL
Anion gap: 14 (ref 5–15)
BUN: 27 mg/dL — ABNORMAL HIGH (ref 8–23)
CO2: 22 mmol/L (ref 22–32)
Calcium: 9.7 mg/dL (ref 8.9–10.3)
Chloride: 95 mmol/L — ABNORMAL LOW (ref 98–111)
Creatinine, Ser: 0.84 mg/dL (ref 0.44–1.00)
GFR calc Af Amer: >60 mL/min (ref >60)
GFR calc non Af Amer: >60 mL/min (ref >60)
GLUCOSE: 555 mg/dL — AB (ref 70–99)
POTASSIUM: 4.2 mmol/L (ref 3.5–5.1)
Sodium: 131 mmol/L — ABNORMAL LOW (ref 135–145)

## 2017-08-15 LAB — CBC
HEMATOCRIT: 41.9 % (ref 36.0–46.0)
Hemoglobin: 13.7 g/dL (ref 12.0–15.0)
MCH: 27.1 pg (ref 26.0–34.0)
MCHC: 32.7 g/dL (ref 30.0–36.0)
MCV: 83 fL (ref 78.0–100.0)
Platelets: 236 10*3/uL (ref 150–400)
RBC: 5.05 MIL/uL (ref 3.87–5.11)
RDW: 13.5 % (ref 11.5–15.5)
WBC: 8.5 10*3/uL (ref 4.0–10.5)

## 2017-08-15 LAB — DIFFERENTIAL
BASOS ABS: 0 10*3/uL (ref 0.0–0.1)
Basophils Relative: 0 %
EOS PCT: 1 %
Eosinophils Absolute: 0.1 10*3/uL (ref 0.0–0.7)
LYMPHS PCT: 27 %
Lymphs Abs: 2.3 10*3/uL (ref 0.7–4.0)
Monocytes Absolute: 0.4 10*3/uL (ref 0.1–1.0)
Monocytes Relative: 5 %
NEUTROS ABS: 5.5 10*3/uL (ref 1.7–7.7)
NEUTROS PCT: 67 %

## 2017-08-15 LAB — CBG MONITORING, ED
GLUCOSE-CAPILLARY: 537 mg/dL — AB (ref 70–99)
GLUCOSE-CAPILLARY: 359 mg/dL — AB (ref 70–99)
GLUCOSE-CAPILLARY: 271 mg/dL — AB (ref 70–99)

## 2017-08-15 LAB — RAPID URINE DRUG SCREEN, HOSP PERFORMED
Amphetamines: NOT DETECTED
BENZODIAZEPINES: NOT DETECTED
Barbiturates: NOT DETECTED
COCAINE: NOT DETECTED
OPIATES: NOT DETECTED
Tetrahydrocannabinol: NOT DETECTED

## 2017-08-15 LAB — URINALYSIS, ROUTINE W REFLEX MICROSCOPIC
BACTERIA UA: NONE SEEN
Bilirubin Urine: NEGATIVE
Glucose, UA: >=500 mg/dL — AB
Hgb urine dipstick: NEGATIVE
Ketones, ur: 5 mg/dL — AB
Leukocytes, UA: NEGATIVE
Nitrite: NEGATIVE
PROTEIN: NEGATIVE mg/dL
Specific Gravity, Urine: 1.025 (ref 1.005–1.030)
pH: 5 (ref 5.0–8.0)

## 2017-08-15 LAB — ACETAMINOPHEN LEVEL: Acetaminophen (Tylenol), Serum: <10 ug/mL — ABNORMAL LOW (ref 10–30)

## 2017-08-15 LAB — SALICYLATE LEVEL

## 2017-08-15 LAB — ETHANOL: Alcohol, Ethyl (B): <10 mg/dL (ref <10)

## 2017-08-15 MED ORDER — INSULIN ASPART 100 UNIT/ML ~~LOC~~ SOLN
5.0000 [IU] | Freq: Once | SUBCUTANEOUS | Status: AC
  Administered 2017-08-15: 5 [IU] via SUBCUTANEOUS
  Filled 2017-08-15: qty 1

## 2017-08-15 MED ORDER — METFORMIN HCL 500 MG PO TABS
500.0000 mg | ORAL_TABLET | Freq: Once | ORAL | Status: AC
  Administered 2017-08-15: 500 mg via ORAL
  Filled 2017-08-15: qty 1

## 2017-08-15 MED ORDER — METFORMIN HCL ER 500 MG PO TB24
500.0000 mg | ORAL_TABLET | Freq: Every day | ORAL | Status: DC
  Administered 2017-08-16: 500 mg via ORAL
  Filled 2017-08-15 (×2): qty 1

## 2017-08-15 MED ORDER — INSULIN ASPART PROT & ASPART (70-30 MIX) 100 UNIT/ML ~~LOC~~ SUSP
35.0000 [IU] | Freq: Every day | SUBCUTANEOUS | Status: DC
  Administered 2017-08-16: 35 [IU] via SUBCUTANEOUS
  Filled 2017-08-15 (×2): qty 10

## 2017-08-15 MED ORDER — SODIUM CHLORIDE 0.9 % IV BOLUS
1000.0000 mL | Freq: Once | INTRAVENOUS | Status: AC
  Administered 2017-08-15: 1000 mL via INTRAVENOUS

## 2017-08-15 NOTE — Telephone Encounter (Signed)
Patient daughter calling in from the [facility], about a phone call from 08/12/17. The CRM created on 08/12/17 was resolved but not routed to PCP. Spoke with patient daughter Kathryn Mcguire. Kathryn Mcguire is upset about the delayed process.  The letter was competed today by PCP that patient is unable to make decisions on her own.  Patient's daughter is in the process of guardianship.  Kathryn Mcguire (patient daughter) that she will need to contact Kathryn Mcguire for a letter as well and to follow up on the next steps/process. Kathryn Mcguire (patient's daughter) states that Kathryn Mcguire is seeing a therapist Kathryn Mcguire. They will request records to review her medical records. Kathryn Mcguire states she will bring Kathryn Mcguire assessment for PCP to review and have in medical records. When picking up the letter PCP completed today. Kathryn Mcguire states patient is using drugs (no recent drug screen completed to verify), she is not bathing, she is eating raw food, vomiting, refusing to speak, she cut all her hair off, she is having skin changes(discoloration), extremely elevated blood sugars, and declines taking any of her medications.  Kathryn Mcguire states her mother mental health and medical health is declining rapidly and wants help for her mother ASAP.  Advised for her to take to ER for evaluation to rule out any infections and mental status changes. Advised since patient has been evaluated by Kathryn Mcguire (Neuropsychology) that there is documentation in her chart that patient is unable to make sound decisions on her own.
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
## 2017-08-15 NOTE — Telephone Encounter (Signed)
Call patients daughter to verify fax number for therapist so it can be sent directly to therapist. There is a hard copy up front for pick up as well.

## 2017-08-15 NOTE — ED Notes (Signed)
Called lab to add differential. Lab stated there still needs to be a dark green for the ethanol, acetaminophen, and salicylate.

## 2017-08-15 NOTE — Telephone Encounter (Signed)
Spoke with Patient's daughter, she states that Kathryn Mcguire's sugars are 492. She states that her mother has agreed to go somewhere to get some help but she doesn't know where to take her.  After consulting with Dr. Birdie Riddle, I called the daughter back and Dr. Birdie Riddle Recommended that they Take her To [facility]. Patient's daughter verbalized understanding.   Doloris Hall,  LPN

## 2017-08-15 NOTE — Telephone Encounter (Signed)
I agree with the advice given. I can understand how frustrating it is that the guardianship process is a long one. Thankfully now she has had formal evaluation with neuropsychology and has been deemed lacking in capacity. Hopefully my letter will expedite the process for them somewhat, but a letter from the neuropsychologist would likely be more beneficial. They should also Request a letter from her psychiatrist and therapist, helping t o document her noncompliance with medication, as well as her mental and cognitive status. The recent evaluation with specialist should hopefully make their ER assessment easier on the family in the sense that the providers will be able to take patients lack of capacity into consideration while evaluating Kathryn Mcguire. We have also recommended involuntary commitment by the family previously given their prior concerns, but they had chosen no t to do so.

## 2017-08-15 NOTE — Telephone Encounter (Signed)
Please advise? It says pt daughter wants letter faxed to therapist however the fax included has attention to pt daughter name.Marland Kitchen

## 2017-08-15 NOTE — ED Notes (Signed)
Pt is alert and oriented x 4 and is verbally responsive. Pt is at bedside. Pt denies any pain at this time. Obtained urine specimen.

## 2017-08-15 NOTE — ED Provider Notes (Signed)
[facility] DEPT Provider Note   CSN: [id number] Arrival date & time: 08/15/17  1725     History   Chief Complaint Chief Complaint  Patient presents with  . Hyperglycemia    HPI Kathryn Mcguire is a 67 y.o. female.  HPI  Patient is a 67 year old female with a history of bipolar depression, COPD, T2DM, elevated LFTs, emphysema, GERD, hyperlipidemia, hypertension who presents emergency department today for evaluation of hyperglycemia and psychiatric evaluation.  Patient's daughter is at bedside and assist with the history.  She states that patient has "cognitive delay ", that was diagnosed after being evaluated by neuropsychology.  States she is unsure if the cognitive delay is due to dementia, substance abuse, or her underlying psychiatric condition, however family is concerned as patient has had erratic behaviors at home and they feel that she is unsafe.  They state that she has left the house at 2 AM several times to go to the store.  She often leaves the stove on and forgets about it.  She under cooks her food and is often eating raw food at home.  She is noncompliant with any of her medications.  They are concerned that she is abusing drugs.  They state that they have found pills in the house that do not belong to her.  She also has a history of drinking alcohol however daughter at bedside does not think that she drinks every day.  She endorses that family members have reported the patient has hallucinated and has been talking to voices.  She denies any endorsement of SI or HI.  The patient was brought here requesting behavioral health evaluation that was recommended by her PCP and neuropsychologist.  Family is currently trying to obtain guardianship of the patient.  They state that the patient is currently at her mental baseline.  The symptoms described above have been developing over the course of the last 3 years and daughter denies any acute changes.  She  denies any known fevers or infectious symptoms.  Patient denies any chest pain, shortness of breath, fevers, chills, cough, congestion, abdominal pain, nausea, vomiting, diarrhea, urinary symptoms.  No headaches, numbness or weakness to the bilateral upper or lower extremities.  Patient denies any SI, HI, AVH.  She states that she has been compliant with her medications.  She denies any drug or alcohol use.  Does endorse tobacco use.  Past Medical History:  Diagnosis Date  . Arthritis   . Bipolar depression ([place])   . COPD (chronic obstructive pulmonary disease) ([place])   . Diabetes mellitus without complication ([place])   . Elevated LFTs   . Emphysema of lung ([place])   . GERD (gastroesophageal reflux disease)   . History of adenomatous polyps of colon 10/15/2013  . Hyperlipemia   . Hypertension   . UTI (lower urinary tract infection)     Patient Active Problem List   Diagnosis Date Noted  . Essential hypertension 02/04/2017  . Self neglect 02/04/2017  . Acute cystitis without hematuria 01/17/2017  . Breast cancer screening 12/30/2016  . Hypertension 12/21/2016  . Type 2 diabetes mellitus without complications ([place]) 34/19/3790  . Patient's other noncompliance with medication regimen 06/08/2014  . Bipolar 1 disorder, depressed ([place]) 01/18/2014  . Hyperglycemia   . Noncompliance with medication regimen   . Psychiatric illness   . Long-term insulin use ([place]) 01/07/2014  . Vitamin D deficiency 01/06/2014  . History of adenomatous polyps of colon 10/15/2013  . Abnormal LFTs 08/25/2013  .  Transaminitis 06/17/2013  . Depression 06/15/2013  . Severe bipolar I disorder, most recent episode mixed, with psychotic features, mood-incongruent ([place]) 06/15/2013  . Dyslipidemia 04/20/2013  . GERD (gastroesophageal reflux disease) 04/20/2013  . Osteoporosis 04/20/2013  . COPD (chronic obstructive pulmonary disease) ([place]) 06/16/2012  . S/P colonoscopic polypectomy 06/16/2012    Past Surgical  History:  Procedure Laterality Date  . TONSILLECTOMY    . TONSILLECTOMY AND ADENOIDECTOMY    . TUBAL LIGATION       OB History   None      Home Medications    Prior to Admission medications   Medication Sig Start Date End Date Taking? Authorizing Provider  metFORMIN (GLUCOPHAGE-XR) 500 MG 24 hr tablet TAKE ONE TABLET BY MOUTH WITH BREAKFAST AND ONE WITH SUPPER FOR  DIABETES 01/04/17  Yes Brunetta Jeans, PA-C  NOVOLIN 70/30 (70-30) 100 UNIT/ML injection Inject 35 units with breakfast and dinner 01/04/17  Yes Brunetta Jeans, PA-C  atorvastatin (LIPITOR) 40 MG tablet Take 1 tablet (40 mg total) by mouth daily. Patient not taking: Reported on 02/01/2017 12/28/16   Brunetta Jeans, PA-C    Family History Family History  Problem Relation Age of Onset  . Colon cancer Neg Hx   . Stomach cancer Neg Hx   . Esophageal cancer Neg Hx   . Rectal cancer Neg Hx     Social History Social History   Tobacco Use  . Smoking status: Current Every Day Smoker    Packs/day: 1.00    Types: Cigarettes  . Smokeless tobacco: Former Systems developer    Types: Chew  Substance Use Topics  . Alcohol use: Yes    Comment: beer  . Drug use: No     Allergies   Patient has no known allergies.   Review of Systems Review of Systems  Constitutional: Negative for chills and fever.  HENT: Negative for congestion, rhinorrhea and sore throat.   Eyes: Negative for visual disturbance.  Respiratory: Negative for cough and shortness of breath.   Gastrointestinal: Negative for abdominal pain, constipation, diarrhea, nausea and vomiting.  Genitourinary: Negative for dysuria, frequency, hematuria and urgency.  Musculoskeletal: Negative for back pain.  Skin: Negative for wound.  Neurological: Negative for dizziness, weakness, light-headedness, numbness and headaches.   Physical Exam Updated Vital Signs BP 95/65 (BP Location: Right Arm)   Pulse 69   Temp 98.8 F (37.1 C) (Oral)   Resp 16   Ht 5\' 3"  (1.6  m)   Wt 57.2 kg   SpO2 97%   BMI 22.32 kg/m   Physical Exam  Constitutional: She appears well-developed and well-nourished. No distress.  HENT:  Head: Normocephalic and atraumatic.  Dry mucous membranes  Eyes: Pupils are equal, round, and reactive to light. Conjunctivae and EOM are normal.  Neck: Neck supple.  Cardiovascular: Normal rate, regular rhythm, normal heart sounds and intact distal pulses.  No murmur heard. Pulmonary/Chest: Effort normal and breath sounds normal. No stridor. No respiratory distress. She has no wheezes.  Abdominal: Soft. Bowel sounds are normal. She exhibits no distension. There is no tenderness. There is no guarding.  Musculoskeletal: Normal range of motion. She exhibits no edema.  Neurological: She is alert.  Patient oriented to self, place, situation, and year.  Not oriented to month or day of the week.   Mental Status:  Alert, Speech fluent without evidence of aphasia. Able to follow 2 step commands without difficulty.  Cranial Nerves:  II: pupils equal, round, reactive to light III,IV, VI:  ptosis not present, extra-ocular motions intact bilaterally  V,VII: smile symmetric, facial light touch sensation equal VIII: hearing grossly normal to voice  X: uvula elevates symmetrically  XI: bilateral shoulder shrug symmetric and strong XII: midline tongue extension without fassiculations Motor:  Normal tone. 5/5 strength of BUE and BLE major muscle groups including strong and equal grip strength and dorsiflexion/plantar flexion Sensory: light touch normal in all extremities. Cerebellar: normal finger-to-nose with bilateral upper extremities, normal heel to shin CV: 2+ radial and DP pulses Negative pronator drift  Skin: Skin is warm and dry. Capillary refill takes less than 2 seconds.  Psychiatric:  Patient has flat affect.  Has short answers.  Is withdrawn.  Nursing note and vitals reviewed.    ED Treatments / Results  Labs (all labs ordered are  listed, but only abnormal results are displayed) Labs Reviewed  BASIC METABOLIC PANEL - Abnormal; Notable for the following components:      Result Value   Sodium 131 (*)    Chloride 95 (*)    Glucose, Bld 555 (*)    BUN 27 (*)    All other components within normal limits  URINALYSIS, ROUTINE W REFLEX MICROSCOPIC - Abnormal; Notable for the following components:   Color, Urine STRAW (*)    Glucose, UA >=500 (*)    Ketones, ur 5 (*)    All other components within normal limits  ACETAMINOPHEN LEVEL - Abnormal; Notable for the following components:   Acetaminophen (Tylenol), Serum <10 (*)    All other components within normal limits  CBG MONITORING, ED - Abnormal; Notable for the following components:   Glucose-Capillary 537 (*)    All other components within normal limits  CBG MONITORING, ED - Abnormal; Notable for the following components:   Glucose-Capillary 359 (*)    All other components within normal limits  CBG MONITORING, ED - Abnormal; Notable for the following components:   Glucose-Capillary 271 (*)    All other components within normal limits  URINE CULTURE  CBC  RAPID URINE DRUG SCREEN, HOSP PERFORMED  ETHANOL  SALICYLATE LEVEL  DIFFERENTIAL    EKG EKG Interpretation  Date/Time:  Thursday August 15 2017 19:11:06 EDT Ventricular Rate:  75 PR Interval:    QRS Duration: 69 QT Interval:  357 QTC Calculation: 399 R Axis:   38 Text Interpretation:  Sinus rhythm Borderline short PR interval Minimal ST elevation, anterior leads Baseline wander in lead(s) V3 no significant change since 2016 Confirmed by Sherwood Gambler [id number]) on 08/15/2017 7:20:19 PM   Radiology No results found.  Procedures Procedures (including critical care time)  Medications Ordered in ED Medications  metFORMIN (GLUCOPHAGE-XR) 24 hr tablet 500 mg (has no administration in time range)  insulin aspart protamine- aspart (NOVOLOG MIX 70/30) injection 35 Units (has no administration in time  range)  sodium chloride 0.9 % bolus 1,000 mL ( Intravenous Stopped 08/15/17 2108)  metFORMIN (GLUCOPHAGE) tablet 500 mg (500 mg Oral Given 08/15/17 2059)  insulin aspart (novoLOG) injection 5 Units (5 Units Subcutaneous Given 08/15/17 2059)     Initial Impression / Assessment and Plan / ED Course  I have reviewed the triage vital signs and the nursing notes.  Pertinent labs & imaging results that were available during my care of the patient were reviewed by me and considered in my medical decision making (see chart for details).   On review of prior records patient had an MRI brain on 07/06/2017 that showed mild atrophy, typical for age.  Negative for  hydrocephalus and no acute abnormality. Given recent imaging study and no changes since his imaging study was completed, I do not feel that further imaging of the brain is necessary today.  Patient has no focal neuro deficits on exam.  She is at her mental baseline according to daughter at bedside.  Is grossly oriented.  Discussed pt presentation and exam findings with Dr. Regenia Skeeter, who personally evaluated the pt agrees with the current workup and plan to medically clear patient and admit her to geripsych.   Final Clinical Impressions(s) / ED Diagnoses   Final diagnoses:  Hyperglycemia  Encounter for psychiatric assessment   Patient presenting to the emergency department today with family who is concerned that she needs a psychiatric evaluation.  Has a history of bipolar disorder and is only has not been caring for self, taking medications, and is frequently a danger to herself.  Has not been evaluated by neuropsychology who determined that she is not able to make decisions on her own.  According to family they are unsure if she has underlying dementia versus substance abuse versus symptoms occurring because of her bipolar disorder.  They also brought patient to the ED today for evaluation of hyperglycemia.  They states she has not been compliant with  her medication has had elevated blood sugars at home.  Today her labs show an elevated blood glucose above 500.  She has no elevated anion gap and has no signs or symptoms of DKA.  Urine has mild ketones.  Patient clinically does not appear to be in DKA and labs are not supportive of this.  CBC without leukocytosis.  No anemia.  UA without evidence of UTI.  UDS negative.  EtOH negative.  Acetaminophen and salicylate levels are within normal limits.  EKG is unchanged from baseline.  Patient is afebrile with normal vital signs.  Has no signs or symptoms of an infectious cause to explain her symptoms and symptoms have been chronic for the last 3 years.  She also recently had a negative MRI in June of this year.  She has had no changes in mental status since this MRI was completed. Pt is medically cleared. Will have patient evaluated by behavioral health.  Behavioral health evaluated the patient and recommended Geri psych placement. Pt care will be transitioned to oncoming provider at shift change.  ED Discharge Orders    None       Bishop Dublin 08/15/17 2334    Sherwood Gambler, MD 08/16/17 [phone]

## 2017-08-15 NOTE — ED Notes (Addendum)
CRITICAL VALUE STICKER  CRITICAL VALUE: Glucose 555  RECEIVER (on-site recipient of call):C. Chiante Peden, RN   DATE & TIME NOTIFIED: 08/15/2017  MESSENGER (representative from lab): Amy  MD NOTIFIED: Dr. Regenia Skeeter  TIME OF NOTIFICATION: 1905   RESPONSE: awaiting orders.

## 2017-08-15 NOTE — ED Triage Notes (Signed)
Per daughter, pt's glucose is at 492.  Pt does not take her medication on a regular basis. Daughter advised by PCP to bring pt her. Please consult notes per daughter request for request for [facility] assessment.

## 2017-08-15 NOTE — Telephone Encounter (Signed)
Pts daughter will like note faxed out to therapist. Fax # [phone] Attn: [name]

## 2017-08-15 NOTE — Telephone Encounter (Signed)
Pts daughter would like a call once faxed

## 2017-08-15 NOTE — Telephone Encounter (Signed)
Called pt daughter she advised that she works in the therapist office. Would not give me the name of the therapist but told me to fax the letter to therapist assistant Juliet. I obliged and sent that today.

## 2017-08-16 ENCOUNTER — Telehealth: Payer: Self-pay

## 2017-08-16 ENCOUNTER — Ambulatory Visit: Payer: Self-pay | Admitting: Physician Assistant

## 2017-08-16 DIAGNOSIS — R4189 Other symptoms and signs involving cognitive functions and awareness: Secondary | ICD-10-CM

## 2017-08-16 DIAGNOSIS — F0391 Unspecified dementia with behavioral disturbance: Secondary | ICD-10-CM

## 2017-08-16 DIAGNOSIS — F3164 Bipolar disorder, current episode mixed, severe, with psychotic features: Secondary | ICD-10-CM

## 2017-08-16 DIAGNOSIS — F319 Bipolar disorder, unspecified: Secondary | ICD-10-CM

## 2017-08-16 LAB — CBG MONITORING, ED: Glucose-Capillary: 276 mg/dL — ABNORMAL HIGH (ref 70–99)

## 2017-08-16 NOTE — Telephone Encounter (Signed)
Per ER notes I can see -- there is note that a daughter Andee Poles) stated she felt patient would be safe at home and signed patient out. Can we please call the charge nurse to get more information regarding the discharge? No MD notes are in for this yet. Thank you.

## 2017-08-16 NOTE — ED Notes (Signed)
Assumed care of pt, pt is asleep, respirations equal and unlabored. Family and sitter at bedside.

## 2017-08-16 NOTE — ED Notes (Signed)
Pharmacy sent full vial of Novolog 70/30 for patient.  One dose, 35 units, given.  Remainder of vial placed in refrigerated Pyxis in [place] supply room.

## 2017-08-16 NOTE — ED Notes (Signed)
Patient is resting comfortably. Sitter at bedside, vitals stable.

## 2017-08-16 NOTE — Addendum Note (Signed)
Addended by: Leonidas Romberg on: 08/16/2017 04:58 PM   Modules accepted: Orders

## 2017-08-16 NOTE — Telephone Encounter (Signed)
Left a detailed message on daughter Kathryn Mcguire VM.  Urgent referral placed to Neurology at Dr Joretta Bachelor office.  Encouraged them to follow up with [name] for further steps.  Continue with the steps for guardianship.

## 2017-08-16 NOTE — ED Notes (Signed)
Spoke to [place], daughter. Patients daughter had not received update. States, " If she goes home she will do the same thing again." Social work attempting to reach family. Attempting to contact social work regarding update.

## 2017-08-16 NOTE — Telephone Encounter (Signed)
Spoke with ER charge nurse that was only able to provide diagnosis of hyperglycemia and psych assessment. He then transferred me to separate department, where I spoke with [place]. She was unable to provide additional information regarding why patient was not admitted to [facility] and was discharged home instead.

## 2017-08-16 NOTE — ED Notes (Signed)
Patient is resting comfortably. 

## 2017-08-16 NOTE — Telephone Encounter (Signed)
SW patients daughter, Kathryn Mcguire, regarding patients hospital discharge.  Kathryn Mcguire states the hospital called to inform her they were discharging the patient because they "had no reason to keep her". Kathryn Mcguire is very upset and feels like the hospital care team is not listening to the family concerns. There was discussion of admitting patient to an ALF, but no further steps were made. Patient has been discharged to family, who plans to assist with care at this time. Please advise.

## 2017-08-16 NOTE — ED Notes (Signed)
Patients family member, Andee Poles, stated she felt comfortable with patient being discharged home.

## 2017-08-16 NOTE — Telephone Encounter (Signed)
Patient discharged from Psych services per note NP note. Shows they tried to contact family multiple times to discuss things. I really wish family would have been present for the consult as I feel that would have been key to getting her admitted. Since they were not present the only person present to give them information was the patient.  At this point, family needs to continue with plans on guardianship. I recommend we do an urgent referral back to the Neurology group having her see Neurologist for dementia and so they can say once and for all if she is fit to make her own decisions. They also were supposed to follow-up with Dr. Si Raider per her notes but I cannot see where this was done.   Please inform daughter and place urgent referral.  Have them come see me next week so we can reassess sugars and try to figure out our next steps.

## 2017-08-16 NOTE — ED Notes (Signed)
Patients family member here to take patient home. Patient verbalized understanding of discharge instructions, no questions. Patient ambulated out of ED with steady gait in no distress.

## 2017-08-16 NOTE — Consult Note (Addendum)
[facility] Psych ED Discharge  08/16/2017 12:30 PM Kathryn Mcguire  MRN:  [id number] Principal Problem: Depression Discharge Diagnoses:  Patient Active Problem List   Diagnosis Date Noted  . Essential hypertension [I10] 02/04/2017  . Self neglect [R46.89] 02/04/2017  . Acute cystitis without hematuria [N30.00] 01/17/2017  . Encounter for psychiatric assessment [Z76.89] 12/30/2016  . Hypertension [I10] 12/21/2016  . Type 2 diabetes mellitus without complications ([place]) [G29.5] 12/21/2016  . Patient's other noncompliance with medication regimen [Z91.14] 06/08/2014  . Bipolar 1 disorder, depressed ([place]) [F31.9] 01/18/2014  . Hyperglycemia [R73.9]   . Noncompliance with medication regimen [Z91.14]   . Psychiatric illness [F99]   . Long-term insulin use ([place]) [Z79.4] 01/07/2014  . Vitamin D deficiency [E55.9] 01/06/2014  . History of adenomatous polyps of colon [Z86.010] 10/15/2013  . Abnormal LFTs [R94.5] 08/25/2013  . Transaminitis [R74.0] 06/17/2013  . Depression [F32.9] 06/15/2013  . Severe bipolar I disorder, most recent episode mixed, with psychotic features, mood-incongruent ([place]) [F31.64] 06/15/2013  . Dyslipidemia [E78.5] 04/20/2013  . GERD (gastroesophageal reflux disease) [K21.9] 04/20/2013  . Osteoporosis [M81.0] 04/20/2013  . COPD (chronic obstructive pulmonary disease) ([place]) [J44.9] 06/16/2012  . S/P colonoscopic polypectomy [Z98.890] 06/16/2012    Subjective: Pt was seen and chart reviewed with treatment team and Dr Mariea Clonts. Pt denies suicidal/homicidal ideation, denies auditory/visual hallucinations and does not appear to be responding to internal stimuli. Pt stated she is here for "high blood sugar" and does not need psychiatry. Pt stated she lives her husband and his name is Edd Arbour at [phone] but the chart lists a son as Edd Arbour with a different phone number. Pt's family states she has been more confused lately and is leaving the house at [place] and wandering. Pt stated she is  going to the store at that time to get a soda. Pt has had neuropsychology workup and has been said to have "cognitive delay." It is unknown whether pt has a dementia diagnosis at this time. Pt's UDS and BAL are negative. Labwork shows elevated glucose level. EKG reviewed and it is WNL. Family believes  Pt may be drinking alcohol. Pt would benefit from a full neuro work up for dementia and possible ALF placement. TTS has attempted to reach pt's family but so far no answer. Pt is stable and psychiatrically clear for discharge.   Total Time spent with patient: 45 minutes  Past Psychiatric History: As above  Past Medical History:  Past Medical History:  Diagnosis Date  . Arthritis   . Bipolar depression ([place])   . COPD (chronic obstructive pulmonary disease) ([place])   . Diabetes mellitus without complication ([place])   . Elevated LFTs   . Emphysema of lung ([place])   . GERD (gastroesophageal reflux disease)   . History of adenomatous polyps of colon 10/15/2013  . Hyperlipemia   . Hypertension   . UTI (lower urinary tract infection)    Past Surgical History:  Procedure Laterality Date  . TONSILLECTOMY    . TONSILLECTOMY AND ADENOIDECTOMY    . TUBAL LIGATION     Family History:  Family History  Problem Relation Age of Onset  . Colon cancer Neg Hx   . Stomach cancer Neg Hx   . Esophageal cancer Neg Hx   . Rectal cancer Neg Hx    Family Psychiatric  History: Unknown Social History:  Social History   Substance and Sexual Activity  Alcohol Use Yes   Comment: beer    Social History   Substance and Sexual Activity  Drug Use No   Social History   Socioeconomic History  . Marital status: Single    Spouse name: Not on file  . Number of children: 4  . Years of education: Not on file  . Highest education level: Not on file  Occupational History  . Occupation: Retired   Scientific laboratory technician  . Financial resource strain: Not on file  . Food insecurity:    Worry: Not on file    Inability: Not on  file  . Transportation needs:    Medical: Not on file    Non-medical: Not on file  Tobacco Use  . Smoking status: Current Every Day Smoker    Packs/day: 1.00    Types: Cigarettes  . Smokeless tobacco: Former Systems developer    Types: Chew  Substance and Sexual Activity  . Alcohol use: Yes    Comment: beer  . Drug use: No  . Sexual activity: Not on file  Lifestyle  . Physical activity:    Days per week: Not on file    Minutes per session: Not on file  . Stress: Not on file  Relationships  . Social connections:    Talks on phone: Not on file    Gets together: Not on file    Attends religious service: Not on file    Active member of club or organization: Not on file    Attends meetings of clubs or organizations: Not on file    Relationship status: Not on file  Other Topics Concern  . Not on file  Social History Narrative   Daily caffeine     Has this patient used any form of tobacco in the last 30 days? (Cigarettes, Smokeless Tobacco, Cigars, and/or Pipes) Prescription not provided because: Pt is a non-smoker  Current Medications: Current Facility-Administered Medications  Medication Dose Route Frequency Provider Last Rate Last Dose  . insulin aspart protamine- aspart (NOVOLOG MIX 70/30) injection 35 Units  35 Units Subcutaneous Q breakfast Couture, Cortni S, PA-C   35 Units at 08/16/17 1017  . metFORMIN (GLUCOPHAGE-XR) 24 hr tablet 500 mg  500 mg Oral Q breakfast Couture, Cortni S, PA-C   500 mg at 08/16/17 1017   Current Outpatient Medications  Medication Sig Dispense Refill  . metFORMIN (GLUCOPHAGE-XR) 500 MG 24 hr tablet TAKE ONE TABLET BY MOUTH WITH BREAKFAST AND ONE WITH SUPPER FOR  DIABETES 60 tablet 3  . NOVOLIN 70/30 (70-30) 100 UNIT/ML injection Inject 35 units with breakfast and dinner 10 mL 3  . atorvastatin (LIPITOR) 40 MG tablet Take 1 tablet (40 mg total) by mouth daily. (Patient not taking: Reported on 02/01/2017) 30 tablet 3    Musculoskeletal: Strength & Muscle  Tone: within normal limits Gait & Station: not assessed Patient leans: N/A  Psychiatric Specialty Exam: Physical Exam  Nursing note and vitals reviewed. Constitutional: She appears well-developed and well-nourished.  HENT:  Head: Normocephalic and atraumatic.  Neck: Normal range of motion.  Respiratory: Effort normal.  Musculoskeletal: Normal range of motion.  Neurological: She is alert.  Psychiatric: Her speech is normal and behavior is normal. Thought content normal. Her mood appears anxious. Cognition and memory are impaired. She expresses impulsivity. She exhibits a depressed mood.    Review of Systems  Psychiatric/Behavioral: Positive for memory loss. Negative for depression, hallucinations, substance abuse and suicidal ideas. The patient is not nervous/anxious and does not have insomnia.   All other systems reviewed and are negative.   Blood pressure 107/90, pulse 77, temperature 98 F (36.7  C), resp. rate 13, height 5\' 3"  (1.6 m), weight 57.2 kg, SpO2 97 %.Body mass index is 22.32 kg/m.  General Appearance: Casual  Eye Contact:  Fair  Speech:  Clear and Coherent  Volume:  Normal  Mood:  Depressed  Affect:  Congruent and Depressed  Thought Process:  Coherent  Orientation:  Full (Time, Place, and Person)  Thought Content:  Illogical  Suicidal Thoughts:  No  Homicidal Thoughts:  No  Memory:  Immediate;   Fair Recent;   Fair Remote;   Fair  Judgement:  Impaired  Insight:  Lacking  Psychomotor Activity:  Normal  Concentration:  Concentration: Fair and Attention Span: Fair  Recall:  Poor  Fund of Knowledge:  Fair  Language:  Good  Akathisia:  No  Handed:  Right  AIMS (if indicated):   N/A  Assets:  Communication Skills Housing  ADL's:  Intact  Cognition:  WNL  Sleep:   N/A     Demographic Factors:  Age 71 or older  Loss Factors: Decline in physical health  Historical Factors: Impulsivity  Risk Reduction Factors:   Sense of responsibility to family and  Living with another person, especially a relative  Continued Clinical Symptoms:  Depression:   Impulsivity  Cognitive Features That Contribute To Risk:  Closed-mindedness    Suicide Risk:  Minimal: No identifiable suicidal ideation.  Patients presenting with no risk factors but with morbid ruminations; may be classified as minimal risk based on the severity of the depressive symptoms   Plan Of Care/Follow-up recommendations:  Activity:  as tolerated Diet:  Heart healthy  Disposition: Discharge Home Follow up with your current outpatient providers for medication management and neuropsychology care.   Ethelene Hal, NP 08/16/2017, 12:30 PM   Patient seen face-to-face for psychiatric evaluation, chart reviewed and case discussed with the physician extender and developed treatment plan. Reviewed the information documented and agree with the treatment plan.  Buford Dresser, DO 08/16/17 4:12 PM

## 2017-08-16 NOTE — ED Notes (Signed)
Please contact pt's daughter Santiago Glad [phone] or Glenard Haring [phone] with any updates regarding patient. They want to know the results of psych evaluation.

## 2017-08-17 LAB — URINE CULTURE

## 2017-08-19 NOTE — Telephone Encounter (Signed)
Sanjuan Dame patient daughter of PCP recommendations. Referral placed for [facility] is agreeable with setting up appointment with PCP. She will coordinate a time to set up an appointment.

## 2017-08-19 NOTE — Telephone Encounter (Signed)
I understand that this must be frustrating to them. I would recommend they discuss this issue with the hospital member that will contact them to follow-up on their ER stay. Reviewing the documentation in the record I do see where the ER provider's had planned on admission to [facility]. Patient was evaluated by Psychiatry that felt she was stable to be discharged home, needing to be assessed by a Neurologist for underlying dementia. I agree this needs to be done but I do disagree with them discharging her after the plan was to admit her. We are trying to get her in with Neurology ASAP (please check on status of referral). Again I recommend that they come see me in office so that we can discuss skilled nursing facility (assisted living would not be a valid option giving all of the ongoing issues) and next steps. They do need to follow-up with Dr. Si Raider.   Again I really wish a family member would have stayed with her in the ER so that they could have discussed the larger picture/issues with the providers there.

## 2017-08-19 NOTE — Addendum Note (Signed)
Addended by: Leonidas Romberg on: 08/19/2017 05:02 PM   Modules accepted: Orders

## 2017-08-19 NOTE — Telephone Encounter (Signed)
Kathryn Mcguire called back again. Has not followed up with Dr Marcia Brash. Kathryn Mcguire is very upset. Don't understand why there is no treatment plan. The hospital did not review the assessment completed by Dr.Wanda therapist. Patient daughter states the she is getting worst. Family was advised that patient would be given resources for placement. Per ED noted patient was to be placed to [place] for placement.   Please advice

## 2017-08-20 ENCOUNTER — Other Ambulatory Visit: Payer: Self-pay

## 2017-08-20 DIAGNOSIS — F0391 Unspecified dementia with behavioral disturbance: Secondary | ICD-10-CM

## 2017-08-20 DIAGNOSIS — Z9114 Patient's other noncompliance with medication regimen: Secondary | ICD-10-CM

## 2017-08-22 ENCOUNTER — Telehealth: Payer: Self-pay | Admitting: Physician Assistant

## 2017-08-22 ENCOUNTER — Other Ambulatory Visit: Payer: Self-pay

## 2017-08-22 NOTE — Telephone Encounter (Signed)
Copied from [place] [id number]. Topic: Referral - Question >> Aug 22, 2017  9:18 AM Sheran Luz wrote: Reason for CRM: Hinton Dyer from [facility] is requesting a call back about a referral that was placed with them. Hinton Dyer states that the pts insurance will not cover what the pt needs done. Hinton Dyer inquired as to whether or not pt would need another referral.   Best call back number: [phone]

## 2017-08-22 NOTE — Patient Outreach (Signed)
[name]) Care Management  08/22/2017  Kathryn Mcguire Sep 15, 1950 [id number]   Referral Date: 08/20/17 Referral Source: MD referral Referral Reason: Diabetes management   Outreach Attempt: Telephone call to daughter [name] [facility]).  No answer.  HIPAA compliant voice message left.   Plan: RN CM will send letter and attempt patient again within 4 business days.     Jone Baseman, RN, MSN [facility] Care Management Care Management Coordinator Direct Line [phone] Toll Free: [phone]  Fax: [phone]

## 2017-08-23 NOTE — Telephone Encounter (Signed)
Will hold off on wait until [facility] has gone to the home for recommendations.

## 2017-08-26 ENCOUNTER — Other Ambulatory Visit: Payer: Self-pay

## 2017-08-26 NOTE — Patient Outreach (Signed)
[facility]) Care Management  08/26/2017  JESSACA PHILIPPI Nov 15, 1950 [id number]   Referral Date: 08/20/17 Referral Source: MD referral Referral Reason: Diabetes management   Outreach Attempt: Telephone call to daughter [name] [facility]).  Spoke with daughter who states she cannot speak at this time. She states that she will call back.  Plan: RN CM will wait return call.  If no return call will attempt patient again within 4 business days.    Jone Baseman, RN, MSN [place] Management Care Management Coordinator Direct Line [phone] Cell [phone] Toll Free: [phone]  Fax: [phone]

## 2017-08-26 NOTE — Addendum Note (Signed)
Addended by: Jone Baseman on: 08/26/2017 01:40 PM   Modules accepted: Orders

## 2017-08-26 NOTE — Patient Outreach (Signed)
[facility]) Care Management  08/26/2017  Kathryn Mcguire 01-19-1950 [id number]   Referral Date:08/20/17 Referral Source:MD referral Referral Reason:Diabetes management   Return call to daughter Kathryn Mcguire who lives in [place] and is [place].  She is able to verify HIPAA.  She states that she and her family are frustrated with patient needs and getting patient what she needs.  She states that patient is bipolar and is cognitively impaired and they are at their wits end with patient.  She states that patient pooping all over toilet, drinking alcohol and vomiting this past weekend.  Patient currently lives with ex-husband which is her children's father.  He is no longer willing for patient to stay in the home and family has come to an agreement to have patient placed but needs resources and guidance.  Patient is not bathing and dressing unless made to by family.  Patient has had multiple trips to the ER and had an intervention with her therapist per daughter but patient has done nothing.  Family does have documentation from physicians stating that patient is incompetent and cannot make her own decisions.    Patient has DM, HTN, COPD, GERD and is bipolar.  Diabetes is out of control due to non-compliance with medications.  Last A1c was 15.0.  She states that patient is not taking her medications as prescribed.  She states that sometimes she will take her insulin when her ex-husband tells her that her sugar is too high.  Consent: Discussed [facility] services with daughter.  She agrees to nurse and Education officer, museum for assessment, care coordination, resources, and help with placement.     Plan:  RN CM will refer to community nurse and social work.     Kathryn Baseman, RN, MSN [facility] Care Management Care Management Coordinator Direct Line [phone] Toll Free: [phone]  Fax: [phone]

## 2017-08-27 ENCOUNTER — Other Ambulatory Visit: Payer: Self-pay | Admitting: *Deleted

## 2017-08-27 ENCOUNTER — Encounter: Payer: Self-pay | Admitting: Neurology

## 2017-08-27 ENCOUNTER — Ambulatory Visit: Payer: Self-pay

## 2017-08-27 NOTE — Patient Outreach (Addendum)
[facility]) Care Management  08/27/2017  YUMNA EBERS 1950/04/04 [id number]    Referral received 8/19 Initial call unsuccessful  RN attempted outreach call to pt's daughter Alex Gardener) however unsuccessful but able to leave a HIPAA approved voice message requesting a call back. Will further engage at that time and follow up next week with another call. Will also send outreach letter accordingly.  Raina Mina, RN Care Management Coordinator [place] Office [phone]

## 2017-08-29 ENCOUNTER — Ambulatory Visit: Payer: Self-pay

## 2017-08-30 ENCOUNTER — Other Ambulatory Visit: Payer: Self-pay | Admitting: *Deleted

## 2017-08-30 ENCOUNTER — Encounter: Payer: Self-pay | Admitting: *Deleted

## 2017-08-30 NOTE — Patient Outreach (Signed)
[facility]) Care Mcguire  08/30/2017  Kathryn Mcguire 27-May-1950 [id number]   CSW was able to make contact with patient's daughter, Kathryn Mcguire today to perform the initial phone assessment on patient, as well as assess and assist with social work needs and services.  CSW introduced self, explained role and types of services provided through Kathryn Mcguire (Seymour Mcguire).  CSW further explained to Kathryn Mcguire that CSW works with patient's Telephonic RNCM, also with Kathryn Mcguire, Kathryn Mcguire. CSW then explained the reason for the call, indicating that Mrs. Kathryn Mcguire thought that patient would benefit from social work services and resources to assist with long-term care placement arrangements for patient in an assisted living facility.  CSW obtained two HIPAA compliant identifiers from Mrs. Kathryn Mcguire, which included patient's name and date of birth. Mrs. Kathryn Mcguire admits that they are looking to place patient in an assisted living facility as soon as possible, as patient's health continues to decline and patient is unable to perform activities of daily living independently.  Mrs. Kathryn Mcguire indicated that she is wanting to schedule a family meeting to include all respective family members, as well as the patient, to discuss the entire placement process, in addition to learning what their options are.  However, Mrs. Kathryn Mcguire does not feel that patient is capable of making appropriate decisions for herself at this time and is putting the necessary paperwork in place (Kathryn Mcguire documents) to become patient's healthcare agent.  The initial home visit has been scheduled with patient and patient's family for Thursday, September 05, 2017 at 10:00AM. [facility] CM Care Plan Problem One     Most Recent Value  Care Plan Problem One  Level of care issues.  Role Documenting the Problem One  Clinical Social Worker  Care Plan for Problem One  Active   [facility] Long Term Goal   Patient will receive 24 hour care and supervision in a long-term care assisted living facility of choice, within the next 45 days.  THN Long Term Goal Start Date  08/30/17  Interventions for Problem One Long Term Goal  CSW will assist patient and patient's family with arranging long-term care assisted living placement for patient.  THN CM Short Term Goal #1   Patient and patient's family will meet with CSW for an initial home visit to have CSW discuss the entire placement process, within the next two weeks.  THN CM Short Term Goal #1 Start Date  08/30/17  Interventions for Short Term Goal #1  An initial home visit has been scheduled with patient and patient's family to discuss long-term care assisted living placement for patient.  THN CM Short Term Goal #2   Patient and patient's family will decide on an assisted living facility of choice, from the list of assisted living facilities provided to them by CSW, within the next 30 days.  THN CM Short Term Goal #2 Start Date  08/30/17  Interventions for Short Term Goal #2  CSW will provide patient and patient's family with a list of assisted living facilities in [facility] that offer long-term care services.  THN CM Short Term Goal #3  Patient and patient's family will go ahead and apply for Long-Term Care Medicaid through the [place], within the next 30 days.  THN CM Short Term Goal #3 Start Date  08/30/17  Interventions for Short Tern Goal #3  CSW will provide patient and patient's family with a list  of information that they will need to take with them to the [place] when they go to apply for [facility] for patient.  THN CM Short Term Goal #4  Patient and patient's family will contact patient's Primary Care Physician office to check the status of patient's FL-2 Form, to be completed within the next two weeks.  THN CM Short Term Goal #4 Start  Date  08/30/17  Interventions for Short Term Goal #4  CSW will contact patient's Primary Care Physician, Kathryn Mcguire to request that an FL-2 Form be completed on patient, as well as a TB Skin Test order to be placed.     Kathryn Mcguire, BSW, MSW, LCSW  Licensed Education officer, environmental Health System  Mailing [place] [address] [place], [place] [address] Physical Address-[address], [place], [place] [address] Toll Free Main # [phone] Fax # [phone] Cell # [phone]  Office # [phone] [email or web address]

## 2017-09-02 ENCOUNTER — Other Ambulatory Visit: Payer: Self-pay | Admitting: *Deleted

## 2017-09-02 NOTE — Patient Outreach (Addendum)
[facility]) Care Management  09/02/2017  Kathryn Mcguire 06/15/1950 [id number]    Unsuccessful outreach #2  RN attempted another outreach call today to both the home # and caregiver Kathryn Mcguire [place]) and left HIPAA approved voice messages requesting a call back. Will awaiting a call back and inquired further on possible needs for [facility] services. Will rescheduled an additional follow up call of inquiry later in this week.  Kathryn Mina, RN Care Management Coordinator [place] Office [phone]  Addendum: Will contact the involved social worker and inquire on her scheduled home visit for this week for a possible joint visit if possible.

## 2017-09-04 ENCOUNTER — Other Ambulatory Visit: Payer: Self-pay | Admitting: *Deleted

## 2017-09-04 NOTE — Patient Outreach (Signed)
[facility]) Care Management  09/04/2017  Kathryn Mcguire Feb 12, 1950 [id number]  Unsuccessful (3rd attempt)  Third outreach attempt unsuccessful however able to leave HIPAA approved voice messages to both the daughter [name]) and the home number noted via Epic. Will outreach to [employer] who indicated her upcoming visit with involved placement for this pt. RN will remain available is need to continue any disease management issues prior to placement of this pt. Note social worker will once again introduce [facility] RN case manager for assistance if needed with this pt. Case will be closed on 9/2 if no needs are mention on the upcoming home visit related to case management.   Raina Mina, RN Care Management Coordinator [place] Office [phone]

## 2017-09-05 ENCOUNTER — Encounter: Payer: Self-pay | Admitting: *Deleted

## 2017-09-05 ENCOUNTER — Other Ambulatory Visit: Payer: Self-pay | Admitting: *Deleted

## 2017-09-05 ENCOUNTER — Ambulatory Visit: Payer: Self-pay | Admitting: *Deleted

## 2017-09-05 NOTE — Patient Outreach (Addendum)
[facility]) Care Management  09/05/2017  Kathryn Mcguire 19-Jun-1950 [id number]  Patient and patient's daughter, Kathryn Mcguire were both "no shows" for their initial home visit with CSW today.  The home visit was scheduled for 10:00AM on Thursday, September 05, 2017 at patient's home.  CSW was planning to talk with patient and Kathryn Mcguire about the entire placement process for patient.  CSW knocked on patient's door on several different occasions and waited in the driveway for 45 minutes.  CSW also made an attempt to try and contact patient and Kathryn Mcguire, without success.  HIPAA compliant messages were left for patient and Kathryn Mcguire on [employer].  CSW is currently awaiting a return call. If CSW does not receive a return call within the next 3-4 business days, CSW will then make a second outreach attempt.  CSW will mail an outreach letter to patient's home requesting that patient contact CSW if she is interested in receiving social work services through [employer]. THN CM Care Plan Problem One     Most Recent Value  Care Plan Problem One  Level of care issues.  Role Documenting the Problem One  Clinical Social Worker  Care Plan for Problem One  Active  [facility] Long Term Goal   Patient will receive 24 hour care and supervision in a long-term care assisted living facility of choice, within the next 45 days.  THN Long Term Goal Start Date  08/30/17  [facility] CM Short Term Goal #1   Patient and patient's family will meet with CSW for an initial home visit to have CSW discuss the entire placement process, within the next two weeks.  THN CM Short Term Goal #1 Start Date  08/30/17  [facility] CM Short Term Goal #1 Met Date  09/05/17  Interventions for Short Term Goal #1  Patient and patient's daughter were both "no shows" for the initial home visit scheduled for today.  THN CM Short Term Goal #2   Patient and patient's family will decide on an assisted living facility  of choice, from the list of assisted living facilities provided to them by CSW, within the next 30 days.  THN CM Short Term Goal #2 Start Date  08/30/17  [facility] CM Short Term Goal #3  Patient and patient's family will go ahead and apply for Long-Term Care Medicaid through the [place], within the next 30 days.  THN CM Short Term Goal #3 Start Date  08/30/17  [facility] CM Short Term Goal #4  Patient and patient's family will contact patient's Primary Care Physician office to check the status of patient's FL-2 Form, to be completed within the next two weeks.  THN CM Short Term Goal #4 Start Date  08/30/17  Interventions for Short Term Goal #4  CSW will contact patient's Primary Care Physician, Kathryn Mcguire to request that an FL-2 Form be completed on patient, as well as a TB Skin Test order to be placed.     [name], BSW, MSW, LCSW  Licensed Education officer, environmental Health System  Mailing [place] [address], [place], [place] [address] Physical Address-[address], [place], [place] [address] Toll Free Main # [phone] Fax # [phone] Cell # [phone]  Office # [phone] [email or web address]

## 2017-09-10 ENCOUNTER — Other Ambulatory Visit: Payer: Self-pay | Admitting: *Deleted

## 2017-09-10 NOTE — Patient Outreach (Signed)
[facility]) Care Management  09/10/2017  Kathryn Mcguire 1950-02-10 [id number]  Case closure Due to unsuccessful contacts case will be closed and provider notified of pt's disposition with this RN case manager. RN will also notify the involved social worker of pt's disposition with this RN case manger. Case will be closed at this time.   Raina Mina, RN Care Management Coordinator [place] Office [phone]

## 2017-09-11 ENCOUNTER — Other Ambulatory Visit: Payer: Self-pay | Admitting: *Deleted

## 2017-09-11 NOTE — Patient Outreach (Signed)
[facility]) Care Management  09/11/2017  BRITINI GARCILAZO Jan 02, 1951 [id number]   CSW made a second attempt to try and contact patient and patient's daughter, [name] today to perform the phone assessment on patient, as well as assess and assist with social work needs and services, without success.  A HIPAA compliant message was left for patient and [name] on [employer].  CSW continues to await a return call.  CSW will make a third and final outreach attempt within the next 3-4 business days, if a return call is not received from patient and/or [name] in the meantime.  CSW will then proceed with case closure if a return call is not received from patient and/or [name] with a total of 10 business days, as required number of phone attempts will have been made and outreach letter mailed.  [name], BSW, MSW, LCSW  Licensed Education officer, environmental Health System  Mailing [place] [address], [place], [place] [address] Physical Address-[address], [place], [place] [address] Toll Free Main # [phone] Fax # [phone] Cell # [phone]  Office # [phone] [name]

## 2017-09-17 ENCOUNTER — Other Ambulatory Visit: Payer: Self-pay | Admitting: *Deleted

## 2017-09-17 NOTE — Patient Outreach (Signed)
[facility]) Care Management  09/17/2017  Kathryn Mcguire 1950-12-13 [id number]   CSW was able to make contact with patient's daughter, Kathryn Mcguire today to follow-up regarding long-term care placement arrangements for patient.  Mrs. Kathryn Mcguire reported that she is trying to convince patient's husband to agree to placement for patient, as he is currently very reluctant.  Mrs. Kathryn Mcguire indicated that she is trying to schedule another home visit with CSW, as she and patient were a "no show" for the initial home visit.  Mrs. Kathryn Mcguire would like for patient's home health physical therapist to be available at the home visit, along with patient, patient's husband and patient's other daughter.  Mrs. Kathryn Mcguire agreed to contact CSW as soon as she has some times and dates available, with regards to scheduling another home visit.  CSW will make arrangements to contact [name] again on Tuesday, September 24, 2017, if a return call is not received from Mrs. Kathryn Mcguire in the meantime. THN CM Care Plan Problem One     Most Recent Value  Care Plan Problem One  Level of care issues.  Role Documenting the Problem One  Clinical Social Worker  Care Plan for Problem One  Active  [facility] Long Term Goal   Patient will receive 24 hour care and supervision in a long-term care assisted living facility of choice, within the next 45 days.  THN Long Term Goal Start Date  08/30/17  [facility] CM Short Term Goal #1   Patient and patient's family will meet with CSW for an initial home visit to have CSW discuss the entire placement process, within the next two weeks.  THN CM Short Term Goal #1 Start Date  09/17/17  Interventions for Short Term Goal #1  CSW is awaiting a return call from patient's daughter with regards to a time and date that is convenient for them to meet for the initial home visit.  THN CM Short Term Goal #2   Patient and patient's family will decide on an assisted living facility of choice, from the  list of assisted living facilities provided to them by CSW, within the next 30 days.  THN CM Short Term Goal #2 Start Date  08/30/17  [facility] CM Short Term Goal #3  Patient and patient's family will go ahead and apply for Long-Term Care Medicaid through the [place], within the next 30 days.  THN CM Short Term Goal #3 Start Date  08/30/17  [facility] CM Short Term Goal #4  Patient and patient's family will contact patient's Primary Care Physician office to check the status of patient's FL-2 Form, to be completed within the next two weeks.  THN CM Short Term Goal #4 Start Date  08/30/17  Interventions for Short Term Goal #4  CSW will contact patient's Primary Care Physician, Dr. Raiford Mcguire to request that an FL-2 Form be completed on patient, as well as a TB Skin Test order to be placed.    Kathryn Mcguire, BSW, MSW, LCSW  Licensed Education officer, environmental Health System  Mailing [place] [address] [place], [place] [address] Physical Address-[address], [place], [place] [address] Toll Free Main # [phone] Fax # [phone] Cell # [phone]  Office # [phone] [email or web address]

## 2017-09-19 ENCOUNTER — Other Ambulatory Visit: Payer: Self-pay

## 2017-09-19 ENCOUNTER — Ambulatory Visit: Payer: Self-pay | Admitting: *Deleted

## 2017-09-19 ENCOUNTER — Encounter (HOSPITAL_COMMUNITY): Payer: Self-pay | Admitting: *Deleted

## 2017-09-19 ENCOUNTER — Emergency Department (HOSPITAL_COMMUNITY)
Admission: EM | Admit: 2017-09-19 | Discharge: 2017-09-19 | Discharge status: Against Medical Advice | Payer: Medicare HMO | Attending: Emergency Medicine | Admitting: Emergency Medicine

## 2017-09-19 DIAGNOSIS — R739 Hyperglycemia, unspecified: Secondary | ICD-10-CM | POA: Diagnosis not present

## 2017-09-19 DIAGNOSIS — Z5321 Procedure and treatment not carried out due to patient leaving prior to being seen by health care provider: Secondary | ICD-10-CM | POA: Insufficient documentation

## 2017-09-19 LAB — BASIC METABOLIC PANEL
ANION GAP: 11 (ref 5–15)
BUN: 27 mg/dL — ABNORMAL HIGH (ref 8–23)
CALCIUM: 9.5 mg/dL (ref 8.9–10.3)
CO2: 26 mmol/L (ref 22–32)
Chloride: 94 mmol/L — ABNORMAL LOW (ref 98–111)
Creatinine, Ser: 0.97 mg/dL (ref 0.44–1.00)
GFR calc non Af Amer: 60 mL/min — ABNORMAL LOW (ref >60)
Glucose, Bld: 577 mg/dL (ref 70–99)
POTASSIUM: 4.5 mmol/L (ref 3.5–5.1)
Sodium: 131 mmol/L — ABNORMAL LOW (ref 135–145)

## 2017-09-19 LAB — CBG MONITORING, ED
GLUCOSE-CAPILLARY: 545 mg/dL — AB (ref 70–99)
Glucose-Capillary: 550 mg/dL (ref 70–99)

## 2017-09-19 LAB — URINALYSIS, ROUTINE W REFLEX MICROSCOPIC
BACTERIA UA: NONE SEEN
BILIRUBIN URINE: NEGATIVE
Glucose, UA: >=500 mg/dL — AB
HGB URINE DIPSTICK: NEGATIVE
Ketones, ur: NEGATIVE mg/dL
NITRITE: NEGATIVE
PROTEIN: NEGATIVE mg/dL
Specific Gravity, Urine: 1.024 (ref 1.005–1.030)
pH: 5 (ref 5.0–8.0)

## 2017-09-19 LAB — CBC
HEMATOCRIT: 43.1 % (ref 36.0–46.0)
HEMOGLOBIN: 13.8 g/dL (ref 12.0–15.0)
MCH: 26.6 pg (ref 26.0–34.0)
MCHC: 32 g/dL (ref 30.0–36.0)
MCV: 83.2 fL (ref 78.0–100.0)
Platelets: 230 10*3/uL (ref 150–400)
RBC: 5.18 MIL/uL — AB (ref 3.87–5.11)
RDW: 13.2 % (ref 11.5–15.5)
WBC: 8.6 10*3/uL (ref 4.0–10.5)

## 2017-09-19 NOTE — ED Triage Notes (Signed)
Pt said she checked her sugar and it was in the 400's at 1500. She takes insulin and metformin as prescribed. She last took 35 units regular insulin around 10am with her metformin. Denies recently being sick or any other symptoms.

## 2017-09-19 NOTE — ED Notes (Signed)
Called Pt in lobby to be roomed, no response x3.

## 2017-09-19 NOTE — Telephone Encounter (Signed)
Pt husband Edd Arbour called in stating he checked pt sugar level and it read 441. Requests a call back. Cb# [phone]  Spoke with pt's ex-husband, Edd Arbour and asked that he have the pt return call to the office regarding blood sugar level. Edd Arbour, is not on the  Current DPR. Ronne, states that the pt was not at home at the current time and went to the store but he would notify the pt to call the office when she returns home.

## 2017-09-19 NOTE — ED Notes (Signed)
PT BLOOD SUGAR WAS 545

## 2017-09-19 NOTE — Telephone Encounter (Addendum)
Pt returning call to office after pt's ex husband called to report that the pt's blood sugar was 441. Pt reports that she ate before checking her blood sugar and she usually ranges in the 400s. Pt states when she checks her blood sugar in the morning before eating it is usually in the 200s. While on the phone with triage nurse, pt puts Kathryn Mcguire on the phone who states that he checked the pt's blood sugar approximately 5 min ago and the meter is reading "too high" and an actual number was not given. Pt and Kathryn Mcguire advised to seek care in the ED for increased glucose reading. Understanding verbalized.  Reason for Disposition . Blood glucose > 500 mg/dL (27.8 mmol/L)  Answer Assessment - Initial Assessment Questions 1. BLOOD GLUCOSE: "What is your blood glucose level?"      Glucose 441 about an hour ago 2. ONSET: "When did you check the blood glucose?"     About an hour ago 3. USUAL RANGE: "What is your glucose level usually?" (e.g., usual fasting morning value, usual evening value)     400s 4. KETONES: "Do you check for ketones (urine or blood test strips)?" If yes, ask: "What does the test show now?"      Yes, but has not checked recently 5. TYPE 1 or 2:  "Do you know what type of diabetes you have?"  (e.g., Type 1, Type 2, Gestational; doesn't know)      Type  6. INSULIN: "Do you take insulin?" "What type of insulin(s) do you use? What is the mode of delivery? (syringe, pen (e.g., injection or  pump)?"      Insulin-Novolog 35 units am and pm 7. DIABETES PILLS: "Do you take any pills for your diabetes?" If yes, ask: "Have you missed taking any pills recently?"     Metformin 500mg  twice a day 8. OTHER SYMPTOMS: "Do you have any symptoms?" (e.g., fever, frequent urination, difficulty breathing, dizziness, weakness, vomiting)     no 9. PREGNANCY: "Is there any chance you are pregnant?" "When was your last menstrual period?"     n/a  Protocols used: DIABETES - HIGH BLOOD SUGAR-A-AH

## 2017-09-24 ENCOUNTER — Other Ambulatory Visit: Payer: Self-pay | Admitting: *Deleted

## 2017-09-24 NOTE — Patient Outreach (Signed)
[facility]) Care Management  09/24/2017  Kathryn Mcguire 10-Sep-1950 [id number]   CSW was able to make contact with patient's daughter, [name] today to reschedule the initial home visit.  The home visit has been scheduled for Wednesday, September 25, 2017 at 11:00AM at patient's home.  CSW will talk with patient and patient's family about long-term care placement arrangements in an assisted living facility versus skilled nursing facility, depending upon patient's recommended level of care. THN CM Care Plan Problem One     Most Recent Value  Care Plan Problem One  Level of care issues.  Role Documenting the Problem One  Clinical Social Worker  Care Plan for Problem One  Active  [facility] Long Term Goal   Patient will receive 24 hour care and supervision in a long-term care assisted living facility of choice, within the next 45 days.  THN Long Term Goal Start Date  08/30/17  [facility] CM Short Term Goal #1   Patient and patient's family will meet with CSW for an initial home visit to have CSW discuss the entire placement process, within the next two weeks.  THN CM Short Term Goal #1 Start Date  09/17/17  Interventions for Short Term Goal #1  An initial home visit has been scheduled with patient and patient's family to discuss long-term care assisted living placement for patient.  The home visit will take place on Wednesday, September 25, 2017 at 11:00AM.  [facility] CM Short Term Goal #2   Patient and patient's family will decide on an assisted living facility of choice, from the list of assisted living facilities provided to them by CSW, within the next 30 days.  THN CM Short Term Goal #2 Start Date  08/30/17  [facility] CM Short Term Goal #3  Patient and patient's family will go ahead and apply for Long-Term Care Medicaid through the [place], within the next 30 days.  THN CM Short Term Goal #3 Start Date  08/30/17  [facility] CM Short Term Goal #4  Patient and  patient's family will contact patient's Primary Care Physician office to check the status of patient's FL-2 Form, to be completed within the next two weeks.  THN CM Short Term Goal #4 Start Date  08/30/17  Interventions for Short Term Goal #4  CSW will contact patient's Primary Care Physician, Dr. Raiford Noble to request that an FL-2 Form be completed on patient, as well as a TB Skin Test order to be placed.    [name], BSW, MSW, LCSW  Licensed Education officer, environmental Health System  Mailing [place] [address], [place], [place] [address] Physical Address-[address], [place], [place] [address] Toll Free Main # [phone] Fax # [phone] Cell # [phone]  Office # [phone] [email or web address]

## 2017-09-25 ENCOUNTER — Other Ambulatory Visit: Payer: Self-pay | Admitting: *Deleted
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
## 2017-10-03 ENCOUNTER — Ambulatory Visit (INDEPENDENT_AMBULATORY_CARE_PROVIDER_SITE_OTHER): Payer: Medicare HMO | Admitting: Physician Assistant

## 2017-10-03 ENCOUNTER — Encounter: Payer: Self-pay | Admitting: Physician Assistant

## 2017-10-03 ENCOUNTER — Other Ambulatory Visit: Payer: Self-pay

## 2017-10-03 ENCOUNTER — Other Ambulatory Visit: Payer: Self-pay | Admitting: *Deleted

## 2017-10-03 VITALS — BP 92/70 | HR 71 | Temp 98.2°F | Resp 14 | Ht 63.0 in | Wt 133.0 lb

## 2017-10-03 DIAGNOSIS — Z794 Long term (current) use of insulin: Secondary | ICD-10-CM | POA: Diagnosis not present

## 2017-10-03 DIAGNOSIS — E119 Type 2 diabetes mellitus without complications: Secondary | ICD-10-CM

## 2017-10-03 DIAGNOSIS — R152 Fecal urgency: Secondary | ICD-10-CM

## 2017-10-03 DIAGNOSIS — F319 Bipolar disorder, unspecified: Secondary | ICD-10-CM

## 2017-10-03 DIAGNOSIS — I10 Essential (primary) hypertension: Secondary | ICD-10-CM | POA: Diagnosis not present

## 2017-10-03 DIAGNOSIS — E785 Hyperlipidemia, unspecified: Secondary | ICD-10-CM | POA: Diagnosis not present

## 2017-10-03 LAB — LIPID PANEL
CHOL/HDL RATIO: 6
Cholesterol: 306 mg/dL — ABNORMAL HIGH (ref 0–200)
HDL: 53.8 mg/dL (ref >39.00)
LDL Cholesterol: 225 mg/dL — ABNORMAL HIGH (ref 0–99)
NONHDL: 252.27
Triglycerides: 134 mg/dL (ref 0.0–149.0)
VLDL: 26.8 mg/dL (ref 0.0–40.0)

## 2017-10-03 LAB — COMPREHENSIVE METABOLIC PANEL
ALBUMIN: 4.6 g/dL (ref 3.5–5.2)
ALK PHOS: 99 U/L (ref 39–117)
ALT: 22 U/L (ref 0–35)
AST: 18 U/L (ref 0–37)
BILIRUBIN TOTAL: 0.3 mg/dL (ref 0.2–1.2)
BUN: 15 mg/dL (ref 6–23)
CHLORIDE: 92 meq/L — AB (ref 96–112)
CO2: 28 mEq/L (ref 19–32)
Calcium: 10.6 mg/dL — ABNORMAL HIGH (ref 8.4–10.5)
Creatinine, Ser: 0.81 mg/dL (ref 0.40–1.20)
GFR: 90.71 mL/min (ref >60.00)
GLUCOSE: 346 mg/dL — AB (ref 70–99)
Potassium: 4.8 mEq/L (ref 3.5–5.1)
SODIUM: 131 meq/L — AB (ref 135–145)
Total Protein: 7.4 g/dL (ref 6.0–8.3)

## 2017-10-03 LAB — HEMOGLOBIN A1C: HEMOGLOBIN A1C: 15.7 % — AB (ref 4.6–6.5)

## 2017-10-03 MED ORDER — METFORMIN HCL ER 500 MG PO TB24: ORAL_TABLET | ORAL | 3 refills | Status: DC

## 2017-10-03 MED ORDER — NOVOLIN 70/30 (70-30) 100 UNIT/ML ~~LOC~~ SUSP: SUBCUTANEOUS | 3 refills | Status: DC

## 2017-10-03 NOTE — Patient Instructions (Signed)
Please go to the lab today for blood work.  I will call you with your results. We will alter treatment regimen(s) if indicated by your results.   Please restart the Atorvastatin. I have sent in medication refills for you as you have to have been out for > 6 months if you are taking as directed. Restart medications as directed on your list!  We are going to send in the [facility] form to social work so they can help with placement in a long-term facility that can take better care of you and make sure you are taking all medications as directed.  Attend your appointment with Neurology on 10/16/17 as scheduled. I am setting you up with Gastroenterology for the stool incontinence.   Follow-up with me in 1 week to reassess glucose back on medications. We will make further adjustments at that time.

## 2017-10-03 NOTE — Patient Outreach (Signed)
[facility]) Care Management  10/03/2017  Kathryn Mcguire November 26, 1950 [id number]   CSW was able to make contact with patient's ex-husband, Kathryn Mcguire today to follow-up regarding patient's recent appointment with her Primary Care Physician, Dr. Raiford Noble on Thursday, October 03, 2017 at 10:30AM.  According to Kathryn Mcguire, a neurological consult has been placed for patient with Dr. Ellouise Newer at [facility] and will take place on Wednesday, October 16, 2017 at 10:30AM.  Kathryn Mcguire went on to say that Dr. Hassell Done is ordering patient a gastroenterology appointment for stool incontinence.  Kathryn Mcguire reported that Dr. Hassell Done changed patient's diabetic medications and would like to follow-up with patient in his office in one week.  The follow-up appointment with Dr. Hassell Done has been scheduled for Thursday, October 10, 2017 at 10:30AM at [facility].  Kathryn Mcguire reported that he, or his daughter, Kathryn Mcguire will make arrangements to transport patient to all of these appointments. Kathryn Mcguire indicated that Dr. Hassell Done provided him and his daughter, Kathryn Mcguire with a copy of patient's completed and signed FL-2 Form.  Kathryn Mcguire agreed to provide CSW with a copy, but stated that Dr. Hassell Done was planning to send a copy to CSW, as well.  CSW sent an In [employer] to Dr. Sabra Heck, via Epic, providing all of CSW's contact information.  Once the FL-2 Form is received, CSW will fax to all assisted living facilities in [facility] to try and pursue bed offers.  Once bed offers are received, CSW will discuss these offers with Kathryn Mcguire and Kathryn Mcguire, encouraging them to go and tour all facilities of interest.  CSW politely reminded Kathryn Mcguire and Kathryn Mcguire that they may want to go ahead and apply for Long-Term Care Medicaid for patient, through the [place], as the applications usually  take 45-60 days to process, as Mr. Sherrard has already admitted that patient is unable to private pay at an assisted living facility. CSW will wait for the [facility] FL-2 Form from Dr. Sabra Heck, then fax to all assisted living facilities in [place].  CSW will follow-up with Kathryn Mcguire and Kathryn Mcguire as soon as bed offers are received.  CSW will make arrangements to contact Kathryn Mcguire and Kathryn Mcguire again next Thursday, October 10, 2017 at 1:00PM, to follow-up regarding patient's appointment with Dr. Sabra Heck.  CSW was able to ensure that Kathryn Mcguire and Kathryn Mcguire have the correct contact information for CSW, encouraging them to contact CSW directly if anything changes or if they need social work services and/or assistance in the meantime.  Both voiced understanding and were agreeable to this plan. THN CM Care Plan Problem One     Most Recent Value  Care Plan Problem One  Level of care issues.  Role Documenting the Problem One  Clinical Social Worker  Care Plan for Problem One  Active  [facility] Long Term Goal   Patient will receive 24 hour care and supervision in a long-term care assisted living facility of choice, within the next 45 days.  THN Long Term Goal Start Date  08/30/17  [facility] CM Short Term Goal #1   Patient and patient's family will meet with CSW for an initial home visit to have CSW discuss the entire placement process, within the next two weeks.  THN CM Short Term Goal #1 Start Date  09/17/17  [facility] CM Short Term Goal #1 Met Date  09/25/17  [facility]N CM Short Term Goal #2   Patient and patient's family will decide on an assisted living facility of choice, from the list of assisted living facilities provided to them by CSW, within the next 30 days.  THN CM Short Term Goal #2 Start Date  08/30/17  [facility] CM Short Term Goal #3  Patient and patient's family will go ahead and apply for Long-Term Care Medicaid through the [place], within the next 30 days.  THN CM  Short Term Goal #3 Start Date  08/30/17  [facility] CM Short Term Goal #4  Patient and patient's family will contact patient's Primary Care Physician office to check the status of patient's FL-2 Form, to be completed within the next two weeks.  THN CM Short Term Goal #4 Start Date  08/30/17  [facility] CM Short Term Goal #4 Met Date  10/03/17  Interventions for Short Term Goal #4  [facility] FL-2 Form was completed by patient's Primary Care Physician, Dr. Raiford Noble and a copy will be faxed to [place] today.    Kathryn Mcguire, BSW, MSW, LCSW  Licensed Education officer, environmental Health System  Mailing [place] [address] [place], [place] [address] Physical Address-[address], [place], [place] [address] Toll Free Main # [phone] Fax # [phone] Cell # [phone]  Office # [phone] [email or web address]

## 2017-10-03 NOTE — Progress Notes (Signed)
Patient presents to clinic today with her daughter Santiago Glad for completion of FL2 forms for placement in an assisted living facility. Patient with history of Bipolar depression I, uncontrolled diabetes mellitus, Hyperlipidemia, Hypertension, uncontrolled due to patient non-compliance. She has been deemed unable to completely care for herself by her Psychologist, primary care, Neuropsychology and social work. She has pending appointment with Neurology. She has also been diagnosed with mild cognitive impairment. MRI negative. Patient is able to bath and dress herself. Is continent of urine but has recently had issue with bowel incontinence. Patient endorses that she is not taking any psychiatric medications presently as she "does not need them". Notes stopping her cholesterol medication "some time ago". Endorses that she is taking her Insulin as directed but is not checking her glucose levels regularly. On questioning that her insulin has not been filled in > 6 months and on mention that she has had repetitive visits to ER for hyperglycemia, she only shrugs her shoulder. .  In regards to bowel incontinence, patient sates this has been an issue over the past couple of months. Family thinks it has been longer. Denies loose stools or constipation. Denies melena, hematochezia or tenesmus. States she sometimes cannot make it in time. Denies abdominal pain, nausea or vomiting.   Past Medical History:  Diagnosis Date  . Arthritis   . Bipolar depression ([place])   . COPD (chronic obstructive pulmonary disease) ([place])   . Diabetes mellitus without complication ([place])   . Elevated LFTs   . Emphysema of lung ([place])   . GERD (gastroesophageal reflux disease)   . History of adenomatous polyps of colon 10/15/2013  . Hyperlipemia   . Hypertension   . UTI (lower urinary tract infection)     No current outpatient medications on file prior to visit.   No current facility-administered medications on file prior to visit.       No Known Allergies  Family History  Problem Relation Age of Onset  . Colon cancer Neg Hx   . Stomach cancer Neg Hx   . Esophageal cancer Neg Hx   . Rectal cancer Neg Hx     Social History   Socioeconomic History  . Marital status: Single    Spouse name: Not on file  . Number of children: 4  . Years of education: Not on file  . Highest education level: Not on file  Occupational History  . Occupation: Retired   Scientific laboratory technician  . Financial resource strain: Not on file  . Food insecurity:    Worry: Not on file    Inability: Not on file  . Transportation needs:    Medical: Not on file    Non-medical: Not on file  Tobacco Use  . Smoking status: Current Every Day Smoker    Packs/day: 1.00    Types: Cigarettes  . Smokeless tobacco: Former Systems developer    Types: Chew  Substance and Sexual Activity  . Alcohol use: Yes    Comment: beer  . Drug use: No  . Sexual activity: Not on file  Lifestyle  . Physical activity:    Days per week: Not on file    Minutes per session: Not on file  . Stress: Not on file  Relationships  . Social connections:    Talks on phone: Not on file    Gets together: Not on file    Attends religious service: Not on file    Active member of club or organization: Not on file  Attends meetings of clubs or organizations: Not on file    Relationship status: Not on file  Other Topics Concern  . Not on file  Social History Narrative   Daily caffeine     Review of Systems - See HPI.  All other ROS are negative.  BP 92/70   Pulse 71   Temp 98.2 F (36.8 C) (Oral)   Resp 14   Ht '5\' 3"'$  (1.6 m)   Wt 133 lb (60.3 kg)   SpO2 98%   BMI 23.56 kg/m   Physical Exam  Constitutional: She is oriented to person, place, and time. She appears well-developed and well-nourished.  HENT:  Head: Normocephalic and atraumatic.  Neck: Neck supple.  Cardiovascular: Normal rate, regular rhythm, normal heart sounds and intact distal pulses.  Pulmonary/Chest: Effort  normal and breath sounds normal. No stridor. No respiratory distress. She has no wheezes. She has no rales. She exhibits no tenderness.  Abdominal: Soft. Bowel sounds are normal. She exhibits no distension. There is no tenderness.  Genitourinary: Rectum normal. Rectal exam shows no fissure, no mass and guaiac negative stool.  Genitourinary Comments: Chaperone present for DRE. Questionable mild decrease in rectal tone on examination  Neurological: She is alert and oriented to person, place, and time.  Psychiatric: Her affect is blunt. Her speech is delayed. She is slowed. She is not withdrawn and not combative. Thought content is not paranoid and not delusional. She expresses no homicidal and no suicidal ideation. She expresses no suicidal plans and no homicidal plans.  Extremely blunt and flat affect. Clothing is dirty but hair is well-kept today. Nails are very dirty on examination.  Vitals reviewed.   Recent Results (from the past 2160 hour(s))  CBG monitoring, ED     Status: Abnormal   Collection Time: 08/15/17  6:03 PM  Result Value Ref Range   Glucose-Capillary 537 (HH) 70 - 99 mg/dL  Basic metabolic panel     Status: Abnormal   Collection Time: 08/15/17  6:09 PM  Result Value Ref Range   Sodium 131 (L) 135 - 145 mmol/L   Potassium 4.2 3.5 - 5.1 mmol/L   Chloride 95 (L) 98 - 111 mmol/L   CO2 22 22 - 32 mmol/L   Glucose, Bld 555 (HH) 70 - 99 mg/dL    Comment: CRITICAL RESULT CALLED TO, READ BACK BY AND VERIFIED WITH: L CORDON RN 1902 08/15/17 A NAVARRO    BUN 27 (H) 8 - 23 mg/dL   Creatinine, Ser 0.84 0.44 - 1.00 mg/dL   Calcium 9.7 8.9 - 10.3 mg/dL   GFR calc non Af Amer >60 >60 mL/min   GFR calc Af Amer >60 >60 mL/min    Comment: (NOTE) The eGFR has been calculated using the CKD EPI equation. This calculation has not been validated in all clinical situations. eGFR's persistently <60 mL/min signify possible Chronic Kidney Disease.    Anion gap 14 5 - 15    Comment:  Performed at [facility], [place] [address], [place], [place] [address]  CBC     Status: None   Collection Time: 08/15/17  6:09 PM  Result Value Ref Range   WBC 8.5 4.0 - 10.5 K/uL   RBC 5.05 3.87 - 5.11 MIL/uL   Hemoglobin 13.7 12.0 - 15.0 g/dL   HCT 41.9 36.0 - 46.0 %   MCV 83.0 78.0 - 100.0 fL   MCH 27.1 26.0 - 34.0 pg   MCHC 32.7 30.0 - 36.0 g/dL  RDW 13.5 11.5 - 15.5 %   Platelets 236 150 - 400 K/uL    Comment: Performed at [facility], [place] [address] [place], [place] [address]  Differential     Status: None   Collection Time: 08/15/17  6:09 PM  Result Value Ref Range   Neutrophils Relative % 67 %   Neutro Abs 5.5 1.7 - 7.7 K/uL   Lymphocytes Relative 27 %   Lymphs Abs 2.3 0.7 - 4.0 K/uL   Monocytes Relative 5 %   Monocytes Absolute 0.4 0.1 - 1.0 K/uL   Eosinophils Relative 1 %   Eosinophils Absolute 0.1 0.0 - 0.7 K/uL   Basophils Relative 0 %   Basophils Absolute 0.0 0.0 - 0.1 K/uL    Comment: Performed at [facility], [place] [address], [place], [place] [address]  Urinalysis, Routine w reflex microscopic     Status: Abnormal   Collection Time: 08/15/17  7:11 PM  Result Value Ref Range   Color, Urine STRAW (A) YELLOW   APPearance CLEAR CLEAR   Specific Gravity, Urine 1.025 1.005 - 1.030   pH 5.0 5.0 - 8.0   Glucose, UA >=500 (A) NEGATIVE mg/dL   Hgb urine dipstick NEGATIVE NEGATIVE   Bilirubin Urine NEGATIVE NEGATIVE   Ketones, ur 5 (A) NEGATIVE mg/dL   Protein, ur NEGATIVE NEGATIVE mg/dL   Nitrite NEGATIVE NEGATIVE   Leukocytes, UA NEGATIVE NEGATIVE   RBC / HPF 0-5 0 - 5 RBC/hpf   WBC, UA 0-5 0 - 5 WBC/hpf   Bacteria, UA NONE SEEN NONE SEEN   Squamous Epithelial / LPF 0-5 0 - 5   Mucus PRESENT     Comment: Performed at [facility], [place] [address], [place], [place] [address]  Rapid urine drug screen (hospital performed)     Status: None   Collection Time: 08/15/17  7:11 PM  Result Value  Ref Range   Opiates NONE DETECTED NONE DETECTED   Cocaine NONE DETECTED NONE DETECTED   Benzodiazepines NONE DETECTED NONE DETECTED   Amphetamines NONE DETECTED NONE DETECTED   Tetrahydrocannabinol NONE DETECTED NONE DETECTED   Barbiturates NONE DETECTED NONE DETECTED    Comment: (NOTE) DRUG SCREEN FOR MEDICAL PURPOSES ONLY.  IF CONFIRMATION IS NEEDED FOR ANY PURPOSE, NOTIFY LAB WITHIN 5 DAYS. LOWEST DETECTABLE LIMITS FOR URINE DRUG SCREEN Drug Class                     Cutoff (ng/mL) Amphetamine and metabolites    1000 Barbiturate and metabolites    200 Benzodiazepine                 093 Tricyclics and metabolites     300 Opiates and metabolites        300 Cocaine and metabolites        300 THC                            50 Performed at [facility], [place] [address] [place], [place] [address]   Urine culture     Status: Abnormal   Collection Time: 08/15/17  7:11 PM  Result Value Ref Range   Specimen Description      URINE, RANDOM Performed at [place] [address], [place],  [address]    Special Requests      NONE Performed at [facility], [place] [name]  [place], [place] [address]    Culture MULTIPLE SPECIES PRESENT, SUGGEST RECOLLECTION (A)    Report Status 08/17/2017 FINAL   Ethanol     Status: None   Collection Time: 08/15/17  7:48 PM  Result Value Ref Range   Alcohol, Ethyl (B) <10 <10 mg/dL    Comment: (NOTE) Lowest detectable limit for serum alcohol is 10 mg/dL. For medical purposes only. Performed at [facility], [place] [address] [place], [place] [address]   Acetaminophen level     Status: Abnormal   Collection Time: 08/15/17  7:48 PM  Result Value Ref Range   Acetaminophen (Tylenol), Serum <10 (L) 10 - 30 ug/mL    Comment: (NOTE) Therapeutic concentrations vary significantly. A range of 10-30 ug/mL  may be an effective concentration for many patients. However,  some  are best treated at concentrations outside of this range. Acetaminophen concentrations >150 ug/mL at 4 hours after ingestion  and >50 ug/mL at 12 hours after ingestion are often associated with  toxic reactions. Performed at [facility], [place] [address], [place], [place] [address]   Salicylate level     Status: None   Collection Time: 08/15/17  7:48 PM  Result Value Ref Range   Salicylate Lvl <2.8 2.8 - 30.0 mg/dL    Comment: Performed at [facility], [place] [address], [place], [place] [address]  POC CBG, ED     Status: Abnormal   Collection Time: 08/15/17  8:49 PM  Result Value Ref Range   Glucose-Capillary 359 (H) 70 - 99 mg/dL   Comment 1 Notify RN    Comment 2 Document in Chart   POC CBG, ED     Status: Abnormal   Collection Time: 08/15/17 10:32 PM  Result Value Ref Range   Glucose-Capillary 271 (H) 70 - 99 mg/dL  CBG monitoring, ED     Status: Abnormal   Collection Time: 08/16/17 10:11 AM  Result Value Ref Range   Glucose-Capillary 276 (H) 70 - 99 mg/dL   Comment 1 Notify RN   CBG monitoring, ED     Status: Abnormal   Collection Time: 09/19/17  7:27 PM  Result Value Ref Range   Glucose-Capillary 550 (HH) 70 - 99 mg/dL   Comment 1 Notify RN    Comment 2 Document in Chart   Basic metabolic panel     Status: Abnormal   Collection Time: 09/19/17  7:48 PM  Result Value Ref Range   Sodium 131 (L) 135 - 145 mmol/L   Potassium 4.5 3.5 - 5.1 mmol/L   Chloride 94 (L) 98 - 111 mmol/L   CO2 26 22 - 32 mmol/L   Glucose, Bld 577 (HH) 70 - 99 mg/dL    Comment: CRITICAL RESULT CALLED TO, READ BACK BY AND VERIFIED WITH: GIBSON,K. RN '@2046'$  ON 09.12.19 BY COHEN,K    BUN 27 (H) 8 - 23 mg/dL   Creatinine, Ser 0.97 0.44 - 1.00 mg/dL   Calcium 9.5 8.9 - 10.3 mg/dL   GFR calc non Af Amer 60 (L) >60 mL/min   GFR calc Af Amer >60 >60 mL/min    Comment: (NOTE) The eGFR has been calculated using the CKD EPI equation. This calculation has not  been validated in all clinical situations. eGFR's persistently <60 mL/min signify possible Chronic Kidney Disease.    Anion gap 11 5 - 15    Comment: Performed at [facility], [place] [address] [place], [place] [address]  CBC  Status: Abnormal   Collection Time: 09/19/17  7:48 PM  Result Value Ref Range   WBC 8.6 4.0 - 10.5 K/uL   RBC 5.18 (H) 3.87 - 5.11 MIL/uL   Hemoglobin 13.8 12.0 - 15.0 g/dL   HCT 43.1 36.0 - 46.0 %   MCV 83.2 78.0 - 100.0 fL   MCH 26.6 26.0 - 34.0 pg   MCHC 32.0 30.0 - 36.0 g/dL   RDW 13.2 11.5 - 15.5 %   Platelets 230 150 - 400 K/uL    Comment: Performed at [facility], [place] [address], [place], [place] [address]  Urinalysis, Routine w reflex microscopic     Status: Abnormal   Collection Time: 09/19/17  7:48 PM  Result Value Ref Range   Color, Urine COLORLESS (A) YELLOW   APPearance CLEAR CLEAR   Specific Gravity, Urine 1.024 1.005 - 1.030   pH 5.0 5.0 - 8.0   Glucose, UA >=500 (A) NEGATIVE mg/dL   Hgb urine dipstick NEGATIVE NEGATIVE   Bilirubin Urine NEGATIVE NEGATIVE   Ketones, ur NEGATIVE NEGATIVE mg/dL   Protein, ur NEGATIVE NEGATIVE mg/dL   Nitrite NEGATIVE NEGATIVE   Leukocytes, UA TRACE (A) NEGATIVE   RBC / HPF 0-5 0 - 5 RBC/hpf   WBC, UA 0-5 0 - 5 WBC/hpf   Bacteria, UA NONE SEEN NONE SEEN   Squamous Epithelial / LPF 0-5 0 - 5    Comment: Performed at [facility], [place] [address] [place], [place] [address]  CBG monitoring, ED     Status: Abnormal   Collection Time: 09/19/17  8:04 PM  Result Value Ref Range   Glucose-Capillary 545 (HH) 70 - 99 mg/dL   Comment 1 Notify RN    Comment 2 Document in Chart   Comprehensive metabolic panel     Status: Abnormal   Collection Time: 10/03/17 11:23 AM  Result Value Ref Range   Sodium 131 (L) 135 - 145 mEq/L   Potassium 4.8 3.5 - 5.1 mEq/L   Chloride 92 (L) 96 - 112 mEq/L   CO2 28 19 - 32 mEq/L   Glucose, Bld 346 (H) 70 - 99 mg/dL   BUN  15 6 - 23 mg/dL   Creatinine, Ser 0.81 0.40 - 1.20 mg/dL   Total Bilirubin 0.3 0.2 - 1.2 mg/dL   Alkaline Phosphatase 99 39 - 117 U/L   AST 18 0 - 37 U/L   ALT 22 0 - 35 U/L   Total Protein 7.4 6.0 - 8.3 g/dL   Albumin 4.6 3.5 - 5.2 g/dL   Calcium 10.6 (H) 8.4 - 10.5 mg/dL   GFR 90.71 >60.00 mL/min  Lipid panel     Status: Abnormal   Collection Time: 10/03/17 11:23 AM  Result Value Ref Range   Cholesterol 306 (H) 0 - 200 mg/dL    Comment: ATP III Classification       Desirable:  < 200 mg/dL               Borderline High:  200 - 239 mg/dL          High:  > = 240 mg/dL   Triglycerides 134.0 0.0 - 149.0 mg/dL    Comment: Normal:  <150 mg/dLBorderline High:  150 - 199 mg/dL   HDL 53.80 >39.00 mg/dL   VLDL 26.8 0.0 - 40.0 mg/dL   LDL Cholesterol 225 (H) 0 - 99 mg/dL   Total CHOL/HDL Ratio 6     Comment:  Men          Women1/2 Average Risk     3.4          3.3Average Risk          5.0          4.42X Average Risk          9.6          7.13X Average Risk          15.0          11.0                       NonHDL 252.27     Comment: NOTE:  Non-HDL goal should be 30 mg/dL higher than patient's LDL goal (i.e. LDL goal of < 70 mg/dL, would have non-HDL goal of < 100 mg/dL)  Hemoglobin A1c     Status: Abnormal   Collection Time: 10/03/17 11:23 AM  Result Value Ref Range   Hgb A1c MFr Bld 15.7 (H) 4.6 - 6.5 %    Comment: Glycemic Control Guidelines for People with Diabetes:Non Diabetic:  <6%Goal of Therapy: <7%Additional Action Suggested:  >8%     Assessment/Plan: 1. Type 2 diabetes mellitus without complication, with long-term current use of insulin ([place]) Repeat labs today. Patient endorses taking medication but is clearly not taking as directed as recent glucose levels in ER and Rx refill history show. Discussed again with her and family that I cannot take care of her if she is not willing to take care of herself. Family has been encouraged to help her with medication so they can  verify she is taking. They still seem to be dejected to this idea. FL2 form submitted to get her round the clock care in a safe environment.  Repeat labs today. Medications refilled. They are to start helping her take medication. Follow-up 1 week for reassessment back on medications so we can adjust regimen further.  - Comprehensive metabolic panel - Lipid panel - Hemoglobin A1c  2. Essential hypertension 3. Dyslipidemia BP stable today. Patient not taking Atorvastatin as directed. Repeat lipids today. Medication refilled. She is to take as directed due to her significant CV risk.   4. Bipolar 1 disorder, depressed ([place]) FL2 forms completed today.  5. Fecal urgency Hemoccult negative. No stool leakage on exam today. Rectal tone seems to be within normal limits maybe just slightly weak. Referral to GI placed for further assessment and management.     [name], PA-C

## 2017-10-05 ENCOUNTER — Other Ambulatory Visit: Payer: Self-pay | Admitting: Physician Assistant

## 2017-10-05 MED ORDER — ATORVASTATIN CALCIUM 40 MG PO TABS: 40.0000 mg | ORAL_TABLET | Freq: Every day | ORAL | 3 refills | Status: DC

## 2017-10-07 ENCOUNTER — Telehealth: Payer: Self-pay | Admitting: Physician Assistant

## 2017-10-07 NOTE — Telephone Encounter (Signed)
Pt's daughter, Glenard Haring called back.  Copied from [place] [id number]. Topic: Quick Communication - Lab Results >> Oct 07, 2017  4:33 PM Sigurd Sos, LPN wrote: Called patient to inform them of 10/07/2017 lab results. When patient returns call, triage nurse may disclose results.

## 2017-10-07 NOTE — Telephone Encounter (Signed)
Left message for pt's daughter to to return call to office for results.

## 2017-10-09 ENCOUNTER — Telehealth: Payer: Self-pay | Admitting: Physician Assistant

## 2017-10-09 NOTE — Telephone Encounter (Signed)
FYI  Patient's daughter, Glenard Haring called for lab results. Reviewed labs and physician's note with her. She reports Mrs. Justo continues to refuse taking any medications and her insulin. Stating they have realized she is stealing from stores and is very concerned she will be arrested for these acts. There is an appointment/call on the chart with [name], LCSW for tomorrow at 1:00p. Encouraged her to continue to reach out to the SW regarding placement. She cancelled tomorrow's follow-up with Einar Pheasant due to nothing has changed with the patient not taking her medications. Routing to PCP.

## 2017-10-10 ENCOUNTER — Other Ambulatory Visit: Payer: Self-pay | Admitting: *Deleted

## 2017-10-10 ENCOUNTER — Ambulatory Visit: Payer: Self-pay | Admitting: Physician Assistant

## 2017-10-10 NOTE — Patient Outreach (Signed)
[facility]) Care Management  10/10/2017  ROCHELLA BENNER Mar 31, 1950 [id number]   CSW made an attempt to try and contact patient's ex-husband, Makaya Juneau, for whom patient currently resides, to obtain verbal consent to fax out patient's FL-2 Form to all assisted living facilities in [facility] to try and pursue bed offers; however, Mr. Boyan was not available at the time of CSW's call.  CSW also tried calling patient's daughter, [name], but she too, was unavailable.  HIPAA compliant messages were left for Mr. Rabe and [name], explaining the reason for the call, encouraging them to contact CSW at their earliest convenience so that CSW can get patient's information faxed out as soon as possible.  CSW is currently awaiting a return call.  CSW will make a second outreach attempt within the next 3-4 business days, if a return call is not received from Mr. Chervenak and/or [name], in the meantime.  [facility] CM Care Plan Problem One     Most Recent Value  Care Plan Problem One  Level of care issues.  Role Documenting the Problem One  Clinical Social Worker  Care Plan for Problem One  Active  [facility] Long Term Goal   Patient will receive 24 hour care and supervision in a long-term care assisted living facility of choice, within the next 45 days.  [facility]N Long Term Goal Start Date  08/30/17  [facility] CM Short Term Goal #1   Patient and patient's family will meet with CSW for an initial home visit to have CSW discuss the entire placement process, within the next two weeks.  [facility] CM Short Term Goal #1 Start Date  09/17/17  [facility] CM Short Term Goal #1 Met Date  09/25/17  [facility] CM Short Term Goal #2   Patient and patient's family will decide on an assisted living facility of choice, from the list of assisted living facilities provided to them by CSW, within the next 30 days.  THN CM Short Term Goal #2 Start Date  08/30/17  [facility] CM Short Term Goal #3  Patient and patient's family will  go ahead and apply for Long-Term Care Medicaid through the [place], within the next 30 days.  THN CM Short Term Goal #3 Start Date  08/30/17  [facility] CM Short Term Goal #4  Patient and patient's family will contact patient's Primary Care Physician office to check the status of patient's FL-2 Form, to be completed within the next two weeks.  [facility]N CM Short Term Goal #4 Start Date  08/30/17  [facility] CM Short Term Goal #4 Met Date  10/03/17  Inter[facility] for Short Term Goal #4  [facility] FL-2 Form was completed by patient's Primary Care Physician, Dr. Raiford Noble and a copy will be faxed to [place] today.     [name], BSW, MSW, LCSW  Licensed Education officer, [facility] [employer] [facility]  Mailing [place] [address], [place], [place] [address] Physical Address-[address], [place], [place] [address] Toll Free Main # [phone] Fax # [phone] Cell # [phone]  Office # [phone] [email or web address]

## 2017-10-10 NOTE — Telephone Encounter (Signed)
Unfortunately I can no longer supervise patient and her family's neglect of her care. On notes from social work, there are two of her adult children who live in the home, know she does not take her medication but do not attempt to supervise or encourage her to take her medications. They also do not offer any of this information at visits, even when questioned about it. FL2 forms have been completed. I will leave her specialty care at the discretion of those providers but she will need to find a new PCP.   Estill Bamberg, what further steps need to be taken?

## 2017-10-11 ENCOUNTER — Encounter: Payer: Self-pay | Admitting: Physician Assistant

## 2017-10-16 ENCOUNTER — Encounter: Payer: Self-pay | Admitting: Neurology

## 2017-10-16 ENCOUNTER — Ambulatory Visit: Payer: Medicare HMO | Admitting: Neurology

## 2017-10-16 ENCOUNTER — Other Ambulatory Visit: Payer: Self-pay

## 2017-10-16 ENCOUNTER — Other Ambulatory Visit: Payer: Self-pay | Admitting: *Deleted

## 2017-10-16 VITALS — BP 112/66 | HR 89 | Ht 63.0 in | Wt 133.0 lb

## 2017-10-16 DIAGNOSIS — F319 Bipolar disorder, unspecified: Secondary | ICD-10-CM

## 2017-10-16 DIAGNOSIS — F0391 Unspecified dementia with behavioral disturbance: Secondary | ICD-10-CM

## 2017-10-16 MED ORDER — DIVALPROEX SODIUM ER 250 MG PO TB24: ORAL_TABLET | ORAL | 11 refills | Status: DC

## 2017-10-16 NOTE — Patient Instructions (Addendum)
1. Proceed with placement where she has more supervision, while waiting for this, recommend getting in home care to help with medications and home/hygiene supervision  2. Start Depakote ER 250mg  every night  3. Refer to Psychiatry for bipolar disorder  4. Take all your medications as instructed, have family help you with medications  5. Follow-up in 6 months, call for any changes  We have sent a referral to [facility] for [facility]CHIATRY.  Please call [phone] to schedule your first appointment.   FALL PRECAUTIONS: Be cautious when walking. Scan the area for obstacles that may increase the risk of trips and falls. When getting up in the mornings, sit up at the edge of the bed for a few minutes before getting out of bed. Consider elevating the bed at the head end to avoid drop of blood pressure when getting up. Walk always in a well-lit room (use night lights in the walls). Avoid area rugs or power cords from appliances in the middle of the walkways. Use a walker or a cane if necessary and consider physical therapy for balance exercise. Get your eyesight checked regularly.  FINANCIAL OVERSIGHT: Supervision, especially oversight when making financial decisions or transactions is also recommended.  HOME SAFETY: Consider the safety of the kitchen when operating appliances like stoves, microwave oven, and blender. Consider having supervision and share cooking responsibilities until no longer able to participate in those. Accidents with firearms and other hazards in the house should be identified and addressed as well.  ABILITY TO BE LEFT ALONE: If patient is unable to contact 911 operator, consider using LifeLine, or when the need is there, arrange for someone to stay with patients. Smoking is a fire hazard, consider supervision or cessation. Risk of wandering should be assessed by caregiver and if detected at any point, supervision and safe proof recommendations should be  instituted.  MEDICATION SUPERVISION: Inability to self-administer medication needs to be constantly addressed. Implement a mechanism to ensure safe administration of the medications.  RECOMMENDATIONS FOR ALL PATIENTS WITH MEMORY PROBLEMS: 1. Continue to exercise (Recommend 30 minutes of walking everyday, or 3 hours every week) 2. Increase social interactions - continue going to [place] and enjoy social gatherings with friends and family 3. Eat healthy, avoid fried foods and eat more fruits and vegetables 4. Maintain adequate blood pressure, blood sugar, and blood cholesterol level. Reducing the risk of stroke and cardiovascular disease also helps promoting better memory. 5. Avoid stressful situations. Live a simple life and avoid aggravations. Organize your time and prepare for the next day in anticipation. 6. Sleep well, avoid any interruptions of sleep and avoid any distractions in the bedroom that may interfere with adequate sleep quality 7. Avoid sugar, avoid sweets as there is a strong link between excessive sugar intake, diabetes, and cognitive impairment The Mediterranean diet has been shown to help patients reduce the risk of progressive memory disorders and reduces cardiovascular risk. This includes eating fish, eat fruits and green leafy vegetables, nuts like almonds and hazelnuts, walnuts, and also use olive oil. Avoid fast foods and fried foods as much as possible. Avoid sweets and sugar as sugar use has been linked to worsening of memory function.  There is always a concern of gradual progression of memory problems. If this is the case, then we may need to adjust level of care according to patient needs. Support, both to the patient and caregiver, should then be put into place.

## 2017-10-16 NOTE — Patient Outreach (Signed)
[facility]) Care Management  10/16/2017  Kathryn Mcguire May 08, 1950 [id number]   CSW made an attempt to try and contact patient's daughter, [name] today to follow-up with her regarding long-term care placement arrangements for patient in an assisted living facility; however, [name] was not available at the time of CSW's call.  CSW left a HIPAA compliant message on voicemail and is currently awaiting a return call.  CSW will make a second outreach attempt within the next 3-4 business days, if a return call is not received in the meantime. [name], BSW, MSW, LCSW  Licensed Education officer, environmental Health System  Mailing [place] [address], [place], [place] [address] Physical Address-[address], [place], [place] [address] Toll Free Main # [phone] Fax # [phone] Cell # [phone]  Office # [phone] [email or web address]
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
## 2017-10-17 ENCOUNTER — Telehealth: Payer: Self-pay | Admitting: Physician Assistant

## 2017-10-17 ENCOUNTER — Other Ambulatory Visit: Payer: Self-pay | Admitting: *Deleted

## 2017-10-17 NOTE — Telephone Encounter (Signed)
Patient dismissed from [facility] by [name] PA-C, effective 10/11/17. Dismissal letter sent out by 1st class mail.   10/17/17    LM

## 2017-10-17 NOTE — Patient Outreach (Signed)
[facility]) Care Management  10/17/2017  Kathryn Mcguire 01-15-1950 [id number]   CSW received a return call from patient's daughter, [name] today, in response to the HIPAA compliant message left for Kathryn Mcguire on voicemail on Wednesday, October 16, 2017 by CSW.  Kathryn Mcguire admitted that she was confused about patient's recommended level of care, thinking that patient would definitely qualify for skilled nursing.  CSW explained to Kathryn Mcguire that patient does not require skilled nursing level of care, as patient does not currently have a skilled need.  As of today, patient is still able to perform all activities of daily living independently, as well as ambulate without assistance.  Patient does not require nursing services, nor would patient benefit from physical therapy, occupational therapy and/or speech therapy. Patient does suffer from memory deficits and becomes easily confused.  Patient also has a tendency to wander the neighborhood and has made inappropriate choices with regards to her health.  Due to the nature of patient's current mental status, CSW has recommended that patient be placed in a long-term care assisted living facility that offers memory impaired services and has the wander-guard system.  CSW was able to fax patient's FL-2 Form to all of the following assisted living facilities: [facility] [place] [facility] [place] [facility] [place] Kathryn Mcguire admits to receiving bed offers from all of the above named facilities; however, she reports that patient definitely does not have the financial means to afford any of them.  CSW reminded Kathryn Mcguire that someone will need to go to the [facility] to apply for [employer], on patient's behalf.  CSW went on to say that CSW is unable to assist with this process, as Long-Term Care  Medicaid must be applied for in person.  Kathryn Mcguire currently resides in [place]; therefore, she plans to ask patient's ex-husband, Kathryn Mcguire to go and apply for patient.  Kathryn Mcguire then agreed to follow-up with CSW to report findings.  CSW will await a return call.  CSW will make arrangements to contact Kathryn Mcguire again on Monday, October 21, 2017, if a return call is not received from Kathryn Mcguire in the meantime. [facility] CM Care Plan Problem One     Most Recent Value  Care Plan Problem One  Level of care issues.  Role Documenting the Problem One  Clinical Social Worker  Care Plan for Problem One  Active  [facility] Long Term Goal   Patient will receive 24 hour care and supervision in a long-term care assisted living facility of choice, within the next 45 days.  [facility] Long Term Goal Start Date  08/30/17  [facility] CM Short Term Goal #1   Patient and patient's family will meet with CSW for an initial home visit to have CSW discuss the entire placement process, within the next two weeks.  [facility] CM Short Term Goal #1 Start Date  09/17/17  [facility] CM Short Term Goal #1 Met Date  09/25/17  [facility] CM Short Term Goal #2   Patient and patient's family will decide on an assisted living facility of choice, from the list of assisted living facilities provided to them by CSW, within the next 30 days.  [facility] CM Short Term Goal #2 Start Date  08/30/17  [facility] CM Short Term Goal #3  Patient and patient's family will go ahead and apply for Long-Term Care Medicaid through the [facility]  [place], within the next 30 days.  [facility] CM Short Term Goal #3 Start Date  08/30/17  [facility] CM Short Term Goal #4  Patient and patient's family will contact patient's Primary Care Physician office to check the status of patient's FL-2 Form, to be completed within the next two weeks.  [facility] CM Short Term Goal #4 Start Date  08/30/17  [facility] CM Short Term Goal #4 Met Date  10/03/17  [facility] for Short Term Goal #4  [facility]  FL-2 Form was completed by patient's Primary Care Physician, Dr. Raiford Noble and a copy will be faxed to [place] today.    [name], BSW, MSW, LCSW  Licensed Education officer, environmental Health System  Mailing [place] [address] [place], [place] [address] Physical Address-[address], [place], [place] [address] Toll Free Main # [phone] Fax # [phone] Cell # [phone]  Office # [phone] [name]

## 2017-10-21 ENCOUNTER — Other Ambulatory Visit: Payer: Self-pay | Admitting: *Deleted

## 2017-10-21 NOTE — Patient Outreach (Signed)
[facility]) Care Management  10/21/2017  Kathryn Mcguire 29-Nov-1950 [id number]   CSW made an attempt to try and contact patient's daughter, Kathryn Mcguire today, without success.  A HIPAA compliant message was left on voicemail for Kathryn Mcguire and CSW is currently awaiting a return call.  CSW will make a second outreach attempt within the next 3-4 business days, if a return call is not received from [name] in the meantime. [name], BSW, MSW, LCSW  Licensed Education officer, environmental Health System  Mailing [place] [address] [place], [place] [address] Physical Address[address], [place], [place] [address] Toll Free Main # [phone] Fax # [phone] Cell # [phone]  Office # [phone] [email or web address]

## 2017-10-25 ENCOUNTER — Other Ambulatory Visit: Payer: Self-pay | Admitting: *Deleted

## 2017-10-25 ENCOUNTER — Encounter: Payer: Self-pay | Admitting: *Deleted

## 2017-10-25 NOTE — Patient Outreach (Signed)
[facility]) Care Management  10/25/2017  Kathryn Mcguire 08-Nov-1950 [id number]   CSW made a second attempt to try and contact patient's daughter, [name] today to follow-up regarding long-term care placement arrangements for patient in an assisted living facility; however, [name] was not available at the time of CSW's call.  A HIPAA compliant message was left for [name] on [employer].  CSW continues to await a return call.  CSW will make a third and final outreach attempt within the next 3-4 business days, if a return call is not received from [name] in the meantime.  CSW will also mail an outreach letter to patient's home, allowing 10 business days for a response if [name] is interested in continuing to receive social work services through [facility] with [facility].  CSW will then proceed with case closure if a return call is not received from patient with a total of 10 business days, as required number of phone attempts will have been made and outreach letter mailed.  [name], BSW, MSW, LCSW  Licensed Education officer, environmental Health System  Mailing [place] [address] [place], [place] [address] Physical Address-[address], [place], [place] [address] Toll Free Main # [phone] Fax # [phone] Cell # [phone]  Office # [phone] [name]

## 2017-10-30 ENCOUNTER — Other Ambulatory Visit: Payer: Self-pay | Admitting: *Deleted

## 2017-10-30 NOTE — Patient Outreach (Signed)
[facility]) Care Management  10/30/2017  Kathryn Mcguire [birth year] [id number]    CSW made a third and final attempt to try and contact patient's daughter, Kathryn Mcguire today to follow-up regarding long-term care placement arrangements for patient in an assisted living facility, without success.  A HIPAA compliant message was left for [name] on [employer].  CSW is currently awaiting a return call. CSW will proceed with case closure in two business days, if a return call is not received in the meantime, as required number of phone attempts have been made and an outreach letter was mailed to patient's home allowing 10 business days for a response. [name], BSW, MSW, LCSW  Licensed Education officer, environmental [facility]  Mailing [place] [address] [place], [place] [address] Physical Address-[address], [place], [place] [address] Toll Free Main # [phone] Fax # [phone] Cell # [phone]  Office # [phone] [email or web address]

## 2017-11-04 ENCOUNTER — Encounter: Payer: Self-pay | Admitting: *Deleted

## 2017-11-04 ENCOUNTER — Other Ambulatory Visit: Payer: Self-pay | Admitting: *Deleted

## 2017-11-04 NOTE — Patient Outreach (Signed)
[facility]) Care Management  11/04/2017  GRICEL COPEN February 16, 1950 [id number]   CSW will perform a case closure on patient, due to inability to maintain phone contact with patient and family, despite required number of phone attempts made and outreach letter mailed to patient's home, allowing 10 business days for a response if patient and family are interested in continuing to receive social work services through [place] with [facility].  CSW will fax an update to patient's Primary Care Physician, Dr. Raiford Noble to ensure that they are aware of CSW's involvement with patient's plan of care.  [name], BSW, MSW, LCSW  Licensed Education officer, environmental Health System  Mailing [place] [address] [place], [place] [address] Physical Address-[address], [place],  [address] Toll Free Main # [phone] Fax # [phone] Cell # [phone]  Office # [phone] [email or web address]

## 2017-11-08 ENCOUNTER — Encounter: Payer: Self-pay | Admitting: Internal Medicine

## 2017-12-13 ENCOUNTER — Ambulatory Visit: Payer: Self-pay | Admitting: Internal Medicine

## 2017-12-18 ENCOUNTER — Telehealth: Payer: Self-pay | Admitting: Emergency Medicine

## 2017-12-18 NOTE — Telephone Encounter (Signed)
Copied from [place] [id number]. Topic: General - Inquiry >> Dec 13, 2017 12:38 PM Vernona Rieger wrote: Reason for CRM: Patient's daughter, Glenard Haring states she needs a new FL2 filled out by [name] per [employer] with [facility]. She said they finally got her approved for long term medicaid and while they were waiting on that to be approved her FL2 expired. The family is wanting to place her into a facility. She said to please give her a call and if she does not answer it is because she is at work and she can call back around 4pm

## 2017-12-25 NOTE — Telephone Encounter (Signed)
Daughter Kathryn Mcguire is calling because the patient is in dire need of a higher level of care because she is cognitively impaired. Angel hasn't heard back since 12/13/2017. The patient wont take her diabetes medication. Please call [facility] emergency contact listed) or Santiago Glad [phone]), both other daughters of the patient.

## 2017-12-25 NOTE — Telephone Encounter (Signed)
Kathryn Mcguire, please reach out to patient's family. She was dismissed from practice in October due to her and families non-adherence with medications and follow-ups of a continual basis. Her FL2 form was filled out some time ago but they did not proceed with their portion of getting her into a facility. Was even dismissed from [facility] due to multiple missed consults with daughter [name] and other family members. She will need to have FL2 form filled out by her specialist.

## 2017-12-26 NOTE — Telephone Encounter (Signed)
Spoke with daughter, Glenard Haring, and advised that pt was dismissed back in October. She states she was unaware and frustrated with the entire [facility] system. Feels we have let them down and dismissal letter should have been mailed to [place]. I explained that until they have her declared incompetent, we had to communicate the dismissal directly with the patient. I empathized with her and while we tried an numerous occasions to help Ms. Leard we continued to hit roadblocks as did the family.       See phone note from Provider dated 10/10/17.   [facility] has also dismissed pt as of 11/04/17 also due to non-compliance.

## 2018-01-02 ENCOUNTER — Telehealth: Payer: Self-pay | Admitting: Neurology

## 2018-01-02 NOTE — Telephone Encounter (Signed)
Patient's daughter called regarding her mother needing an SL 2 Form filled out for placement in a Skilled nursing facility. Please Call. Thanks

## 2018-01-09 NOTE — Telephone Encounter (Signed)
Daughter called back regarding needing an FL 2 Form filled out for her mother. She had her Mother's PCP fill out her last form. She is now discharged from that practice. The office manager advised her to call Dr. Amparo [place] office regarding the form. Please Call. Thanks

## 2018-01-10 NOTE — Telephone Encounter (Signed)
LMOM advising daughter that Dr. Delice Lesch would most likely fill out FL2 form, however our office does not supply these forms.  Daughter will need to obtain and bring to office.  Also advised that pt will need to be seen within 90 days prior to placement - pt scheduled f/u for May, we might have to change that.

## 2018-02-04 ENCOUNTER — Telehealth: Payer: Self-pay | Admitting: Neurology

## 2018-02-04 NOTE — Telephone Encounter (Signed)
Patient daughter is returning your call and is available at  12:45 to 2 and then after 3 please call her

## 2018-02-05 NOTE — Telephone Encounter (Signed)
Asked Theadora Rama to relay to pt's daughter that we do not carry FL2 forms and that she will need to supply form (this was also relayed to pt's daughter via VM on 01/10/2018)

## 2018-02-05 NOTE — Telephone Encounter (Signed)
Patient's daughter called back. Please Call. She is getting worse and needs a higher level of Care. Her PCP has dismissed her from the practice due to missing appts. Please Call. Thanks

## 2018-02-10 NOTE — Telephone Encounter (Signed)
Spoke with pt's daughter, Glenard Haring, advising that FL2 form has been completed.  Glenard Haring states that either she or her sister, Santiago Glad, will be in sometime this week to pick up form.  Glenard Haring states that they have been trying to place pt, but keep "running into brick walls" pt has been dismissed from her PCP as well as from Education officer, museum.  I advised that I have some resources that may help family with their decision.  Will place resources with FL2 form at front desk for pick up.

## 2018-05-28 ENCOUNTER — Encounter: Payer: Self-pay | Admitting: Neurology

## 2018-06-04 ENCOUNTER — Ambulatory Visit: Payer: Self-pay | Admitting: Neurology

## 2018-09-19 ENCOUNTER — Ambulatory Visit: Payer: Self-pay | Admitting: Family

## 2018-09-23 ENCOUNTER — Ambulatory Visit: Payer: Self-pay | Admitting: Family

## 2018-11-13 ENCOUNTER — Ambulatory Visit: Payer: Self-pay | Admitting: Family

## 2018-11-26 ENCOUNTER — Ambulatory Visit: Payer: Self-pay | Admitting: Family

## 2018-12-03 ENCOUNTER — Ambulatory Visit: Payer: Self-pay | Admitting: Family

## 2019-01-03 ENCOUNTER — Other Ambulatory Visit: Payer: Self-pay

## 2019-01-03 ENCOUNTER — Encounter: Payer: Self-pay | Admitting: Emergency Medicine

## 2019-01-03 ENCOUNTER — Ambulatory Visit
Admission: EM | Admit: 2019-01-03 | Discharge: 2019-01-03 | Disposition: A | Discharge status: Home / Self Care | Payer: Medicare HMO | Attending: Emergency Medicine | Admitting: Emergency Medicine

## 2019-01-03 DIAGNOSIS — J439 Emphysema, unspecified: Secondary | ICD-10-CM | POA: Diagnosis not present

## 2019-01-03 DIAGNOSIS — E119 Type 2 diabetes mellitus without complications: Secondary | ICD-10-CM | POA: Diagnosis not present

## 2019-01-03 DIAGNOSIS — M545 Low back pain, unspecified: Secondary | ICD-10-CM

## 2019-01-03 MED ORDER — DICLOFENAC SODIUM 1 % EX GEL: 2.0000 g | Freq: Four times a day (QID) | CUTANEOUS | 0 refills | Status: DC

## 2019-01-03 MED ORDER — NAPROXEN 500 MG PO TABS: 500.0000 mg | ORAL_TABLET | Freq: Two times a day (BID) | ORAL | 0 refills | Status: DC

## 2019-01-03 NOTE — ED Notes (Signed)
Patient able to ambulate independently  

## 2019-01-03 NOTE — ED Triage Notes (Signed)
Pt presents to [facility] for assessment of left lower back pain after a fall 2 weeks ago.  Pt denies hitting anything but the ground outside, states she slipped on some ice, denies head injury.  Pt does not take blood thinners.

## 2019-01-03 NOTE — Discharge Instructions (Signed)
Take anti-inflammatory as directed. Do NOT take ibuprofen, aleve, BC powders, advil with this medication. You MAY take tylenol still.  Heat therapy (hot compress, warm wash red, hot showers, etc.) can help relax muscles and soothe muscle aches. Cold therapy (ice packs) can be used to help swelling both after injury and after prolonged use of areas of chronic pain/aches.  Return for worsening pain, spreading of pain, fever, change in bowel/bladder habit.

## 2019-01-03 NOTE — ED Provider Notes (Signed)
[facility]    CSN: [id number] Arrival date & time: 01/03/19  1411      History   Chief Complaint Chief Complaint  Patient presents with  . Fall    HPI Kathryn Mcguire is a 68 y.o. female with history of hypertension, diabetes, dementia, COPD, arthritis presenting with her daughter for left low back pain status post fall 2 weeks ago.  Daughter helps provide history: States patient has had some mild low back pain since fall (at which time she did not have any head trauma, LOC), though this morning had worse pain.  Patient states "feels like a normal pain ".  Denies radiation, burning, shooting sensation.  No saddle area anesthesia, change to bowel or bladder habit.  Has not affected gait.  No repeat falls since initial injury.  Patient has tried Tylenol without significant relief.   Past Medical History:  Diagnosis Date  . Arthritis   . Bipolar depression ([place])   . COPD (chronic obstructive pulmonary disease) ([place])   . Dementia ([place])   . Diabetes mellitus without complication ([place])   . Elevated LFTs   . Emphysema of lung ([place])   . GERD (gastroesophageal reflux disease)   . History of adenomatous polyps of colon 10/15/2013  . Hyperlipemia   . Hypertension   . UTI (lower urinary tract infection)     Patient Active Problem List   Diagnosis Date Noted  . Essential hypertension 02/04/2017  . Self neglect 02/04/2017  . Encounter for psychiatric assessment 12/30/2016  . Hypertension 12/21/2016  . Type 2 diabetes mellitus without complications ([place]) [id number]  . Patient's other noncompliance with medication regimen 06/08/2014  . Bipolar 1 disorder, depressed ([place]) 01/18/2014  . Hyperglycemia   . Noncompliance with medication regimen   . Psychiatric illness   . Long-term insulin use ([place]) 01/07/2014  . Vitamin D deficiency 01/06/2014  . History of adenomatous polyps of colon 10/15/2013  . Abnormal LFTs 08/25/2013  . Transaminitis 06/17/2013  . Depression  06/15/2013  . Severe bipolar I disorder, most recent episode mixed, with psychotic features, mood-incongruent ([place]) 06/15/2013  . Dyslipidemia 04/20/2013  . GERD (gastroesophageal reflux disease) 04/20/2013  . Osteoporosis 04/20/2013  . COPD (chronic obstructive pulmonary disease) ([place]) 06/16/2012  . S/P colonoscopic polypectomy 06/16/2012    Past Surgical History:  Procedure Laterality Date  . COLONOSCOPY    . ESOPHAGOGASTRODUODENOSCOPY    . TONSILLECTOMY    . TONSILLECTOMY AND ADENOIDECTOMY    . TUBAL LIGATION      OB History   No obstetric history on file.      Home Medications    Prior to Admission medications   Medication Sig Start Date End Date Taking? Authorizing Provider  atorvastatin (LIPITOR) 40 MG tablet Take 1 tablet (40 mg total) by mouth daily. 10/05/17   Brunetta Jeans, PA-C  diclofenac Sodium (VOLTAREN) 1 % GEL Apply 2 g topically 4 (four) times daily. 01/03/19   Hall-Potvin, [place], PA-C  divalproex (DEPAKOTE ER) 250 MG 24 hr tablet Take 1 tablet every night 10/16/17   Cameron Sprang, MD  Glucerna [facility]) LIQD Take 237 mLs by mouth.    [provider]  metFORMIN (GLUCOPHAGE-XR) 500 MG 24 hr tablet TAKE ONE TABLET BY MOUTH WITH BREAKFAST AND ONE WITH SUPPER FOR  DIABETES 10/03/17   Brunetta Jeans, PA-C  naproxen (NAPROSYN) 500 MG tablet Take 1 tablet (500 mg total) by mouth 2 (two) times daily for 14 days. 01/03/19 01/17/19  Hall-Potvin, [place], PA-C  NOVOLIN 70/30 (70-30) 100 UNIT/ML injection Inject 35 units with breakfast and dinner 10/03/17   Brunetta Jeans, PA-C    Family History Family History  Problem Relation Age of Onset  . Diabetes Mother   . Lung cancer Sister   . Diabetes Daughter   . Lupus Daughter   . Migraines Daughter        chronic  . Bursitis Daughter   . Colon cancer Neg Hx   . Stomach cancer Neg Hx   . Esophageal cancer Neg Hx   . Rectal cancer Neg Hx     Social History Social History   Tobacco Use  .  Smoking status: Current Every Day Smoker    Packs/day: 5.00    Types: Cigarettes  . Smokeless tobacco: Current User    Types: Chew  Substance Use Topics  . Alcohol use: Not Currently    Comment:    . Drug use: No     Allergies   Patient has no known allergies.   Review of Systems Review of Systems  Constitutional: Negative for fatigue and fever.  Respiratory: Negative for cough and shortness of breath.   Cardiovascular: Negative for chest pain and palpitations.  Musculoskeletal: Negative for gait problem, neck pain and neck stiffness.       Positive for left low back pain without radiation  Neurological: Negative for weakness and numbness.     Physical Exam Triage Vital Signs ED Triage Vitals  Enc Vitals Group     BP      Pulse      Resp      Temp      Temp src      SpO2      Weight      Height      Head Circumference      Peak Flow      Pain Score      Pain Loc      Pain Edu?      Excl. in [place]?    No data found.  Updated Vital Signs BP (!) 163/98 (BP Location: Left Arm)   Pulse 96   Temp (!) 97 F (36.1 C) (Temporal)   Resp 18   SpO2 95%   Visual Acuity Right Eye Distance:   Left Eye Distance:   Bilateral Distance:    Right Eye Near:   Left Eye Near:    Bilateral Near:     Physical Exam Constitutional:      General: She is not in acute distress.    Appearance: She is normal weight. She is not ill-appearing.  HENT:     Head: Normocephalic and atraumatic.     Mouth/Throat:     Mouth: Mucous membranes are moist.     Pharynx: Oropharynx is clear.  Eyes:     General: No scleral icterus.    Pupils: Pupils are equal, round, and reactive to light.  Cardiovascular:     Rate and Rhythm: Normal rate.  Pulmonary:     Effort: Pulmonary effort is normal.  Musculoskeletal:     Cervical back: Normal.     Thoracic back: Normal.     Lumbar back: Tenderness present. No swelling, deformity, spasms or bony tenderness. Normal range of motion. Negative  right straight leg raise test and negative left straight leg raise test. No scoliosis.       Back:     Right lower leg: No edema.     Left lower leg: No edema.  Comments: 5/5 strength of bilateral lower extremities symmetrically.  Skin:    Capillary Refill: Capillary refill takes less than 2 seconds.     Coloration: Skin is not jaundiced or pale.     Findings: No rash.  Neurological:     Mental Status: She is alert and oriented to person, place, and time.     Coordination: Coordination normal.     Gait: Gait normal.     Deep Tendon Reflexes: Reflexes normal.       UC Treatments / Results  Labs (all labs ordered are listed, but only abnormal results are displayed) Labs Reviewed - No data to display  EKG   Radiology No results found.  Procedures Procedures (including critical care time)  Medications Ordered in UC Medications - No data to display  Initial Impression / Assessment and Plan / UC Course  I have reviewed the triage vital signs and the nursing notes.  Pertinent labs & imaging results that were available during my care of the patient were reviewed by me and considered in my medical decision making (see chart for details).     Patient with mild TTP to palpation over left lower back, otherwise exam very reassuring, better than anticipated given patient's age, comorbidities.  Low concern for fracture at this time: Shared decision making with patient, patient's mother, and I resulting in deferral of x-ray at this time.  Will trial Naprosyn as anti-inflammatory, continue Tylenol, and add heating pad and stretches.  Return precautions discussed, patient verbalized understanding and is agreeable to plan. Final Clinical Impressions(s) / UC Diagnoses   Final diagnoses:  Acute left-sided low back pain without sciatica     Discharge Instructions     Take anti-inflammatory as directed. Do NOT take ibuprofen, aleve, BC powders, advil with this medication. You MAY  take tylenol still.  Heat therapy (hot compress, warm wash red, hot showers, etc.) can help relax muscles and soothe muscle aches. Cold therapy (ice packs) can be used to help swelling both after injury and after prolonged use of areas of chronic pain/aches.  Return for worsening pain, spreading of pain, fever, change in bowel/bladder habit.    ED Prescriptions    Medication Sig Dispense Auth. Provider   diclofenac Sodium (VOLTAREN) 1 % GEL Apply 2 g topically 4 (four) times daily. 100 g Hall-Potvin, [place], PA-C   naproxen (NAPROSYN) 500 MG tablet Take 1 tablet (500 mg total) by mouth 2 (two) times daily for 14 days. 28 tablet Hall-Potvin, [place], PA-C     PDMP not reviewed this encounter.   Hall-Potvin, [place], [place] 01/03/19 1535

## 2019-01-13 ENCOUNTER — Ambulatory Visit (INDEPENDENT_AMBULATORY_CARE_PROVIDER_SITE_OTHER): Payer: Medicare HMO | Admitting: Family

## 2019-01-13 ENCOUNTER — Encounter: Payer: Self-pay | Admitting: Family

## 2019-01-13 ENCOUNTER — Other Ambulatory Visit: Payer: Self-pay

## 2019-01-13 VITALS — BP 112/80 | HR 95 | Temp 97.1°F | Resp 18 | Ht 63.0 in | Wt 135.6 lb

## 2019-01-13 DIAGNOSIS — F319 Bipolar disorder, unspecified: Secondary | ICD-10-CM | POA: Diagnosis not present

## 2019-01-13 DIAGNOSIS — L602 Onychogryphosis: Secondary | ICD-10-CM

## 2019-01-13 DIAGNOSIS — Z794 Long term (current) use of insulin: Secondary | ICD-10-CM

## 2019-01-13 DIAGNOSIS — J439 Emphysema, unspecified: Secondary | ICD-10-CM | POA: Diagnosis not present

## 2019-01-13 DIAGNOSIS — Z23 Encounter for immunization: Secondary | ICD-10-CM | POA: Diagnosis not present

## 2019-01-13 DIAGNOSIS — E785 Hyperlipidemia, unspecified: Secondary | ICD-10-CM

## 2019-01-13 DIAGNOSIS — Z1211 Encounter for screening for malignant neoplasm of colon: Secondary | ICD-10-CM

## 2019-01-13 DIAGNOSIS — H0266 Xanthelasma of left eye, unspecified eyelid: Secondary | ICD-10-CM

## 2019-01-13 DIAGNOSIS — G8929 Other chronic pain: Secondary | ICD-10-CM | POA: Insufficient documentation

## 2019-01-13 DIAGNOSIS — M545 Low back pain, unspecified: Secondary | ICD-10-CM

## 2019-01-13 DIAGNOSIS — F0390 Unspecified dementia without behavioral disturbance: Secondary | ICD-10-CM | POA: Diagnosis not present

## 2019-01-13 DIAGNOSIS — Z720 Tobacco use: Secondary | ICD-10-CM | POA: Diagnosis not present

## 2019-01-13 DIAGNOSIS — L84 Corns and callosities: Secondary | ICD-10-CM

## 2019-01-13 DIAGNOSIS — E119 Type 2 diabetes mellitus without complications: Secondary | ICD-10-CM

## 2019-01-13 DIAGNOSIS — Z1231 Encounter for screening mammogram for malignant neoplasm of breast: Secondary | ICD-10-CM | POA: Diagnosis not present

## 2019-01-13 DIAGNOSIS — H0263 Xanthelasma of right eye, unspecified eyelid: Secondary | ICD-10-CM | POA: Insufficient documentation

## 2019-01-13 DIAGNOSIS — I10 Essential (primary) hypertension: Secondary | ICD-10-CM

## 2019-01-13 MED ORDER — NOVOLIN 70/30 (70-30) 100 UNIT/ML ~~LOC~~ SUSP: SUBCUTANEOUS | 3 refills | Status: DC

## 2019-01-13 MED ORDER — BUPROPION HCL ER (XL) 150 MG PO TB24: 150.0000 mg | ORAL_TABLET | Freq: Every day | ORAL | 0 refills | Status: DC

## 2019-01-13 MED ORDER — METFORMIN HCL ER 500 MG PO TB24: ORAL_TABLET | ORAL | 3 refills | Status: DC

## 2019-01-13 MED ORDER — TETANUS-DIPHTH-ACELL PERTUSSIS 5-2.5-18.5 LF-MCG/0.5 IM SUSP: 0.5000 mL | Freq: Once | INTRAMUSCULAR | 0 refills | Status: AC

## 2019-01-13 NOTE — Patient Instructions (Addendum)
- Please your Tdap vaccine at the pharmacy  - Lab work done today will call you with results. - Get lower back X-ray done at [facility] over [address] at [place].will call you with results.  - Take Bupropion 150 mg tablet one by mouth daily then follow up in 4 weeks to evaluate Tobacco uses   Bupropion extended-release tablets (Depression/Mood Disorders) What is this medicine? BUPROPION (byoo PROE pee on) is used to treat depression. This medicine may be used for other purposes; ask your health care provider or pharmacist if you have questions. COMMON BRAND NAME(S): Aplenzin, Budeprion XL, Forfivo XL, Wellbutrin XL What should I tell my health care provider before I take this medicine? They need to know if you have any of these conditions:  an eating disorder, such as anorexia or bulimia  bipolar disorder or psychosis  diabetes or high blood sugar, treated with medication  glaucoma  head injury or brain tumor  heart disease, previous heart attack, or irregular heart beat  high blood pressure  kidney or liver disease  seizures (convulsions)  suicidal thoughts or a previous suicide attempt  Tourette's syndrome  weight loss  an unusual or allergic reaction to bupropion, other medicines, foods, dyes, or preservatives  breast-feeding  pregnant or trying to become pregnant How should I use this medicine? Take this medicine by mouth with a glass of water. Follow the directions on the prescription label. You can take it with or without food. If it upsets your stomach, take it with food. Do not crush, chew, or cut these tablets. This medicine is taken once daily at the same time each day. Do not take your medicine more often than directed. Do not stop taking this medicine suddenly except upon the advice of your doctor. Stopping this medicine too quickly may cause serious side effects or your condition may worsen. A special MedGuide will be given to you by the pharmacist  with each prescription and refill. Be sure to read this information carefully each time. Talk to your pediatrician regarding the use of this medicine in children. Special care may be needed. Overdosage: If you think you have taken too much of this medicine contact a poison control center or emergency room at once. NOTE: This medicine is only for you. Do not share this medicine with others. What if I miss a dose? If you miss a dose, skip the missed dose and take your next tablet at the regular time. Do not take double or extra doses. What may interact with this medicine? Do not take this medicine with any of the following medications:  linezolid  MAOIs like Azilect, Carbex, Eldepryl, Marplan, Nardil, and Parnate  methylene blue (injected into a vein)  other medicines that contain bupropion like Zyban This medicine may also interact with the following medications:  alcohol  certain medicines for anxiety or sleep  certain medicines for blood pressure like metoprolol, propranolol  certain medicines for depression or psychotic disturbances  certain medicines for HIV or AIDS like efavirenz, lopinavir, nelfinavir, ritonavir  certain medicines for irregular heart beat like propafenone, flecainide  certain medicines for Parkinson's disease like amantadine, levodopa  certain medicines for seizures like carbamazepine, phenytoin, phenobarbital  cimetidine  clopidogrel  cyclophosphamide  digoxin  furazolidone  isoniazid  nicotine  orphenadrine  procarbazine  steroid medicines like prednisone or cortisone  stimulant medicines for attention disorders, weight loss, or to stay awake  tamoxifen  theophylline  thiotepa  ticlopidine  tramadol  warfarin This  list may not describe all possible interactions. Give your health care provider a list of all the medicines, herbs, non-prescription drugs, or dietary supplements you use. Also tell them if you smoke, drink alcohol,  or use illegal drugs. Some items may interact with your medicine. What should I watch for while using this medicine? Tell your doctor if your symptoms do not get better or if they get worse. Visit your doctor or healthcare provider for regular checks on your progress. Because it may take several weeks to see the full effects of this medicine, it is important to continue your treatment as prescribed by your doctor. This medicine may cause serious skin reactions. They can happen weeks to months after starting the medicine. Contact your healthcare provider right away if you notice fevers or flu-like symptoms with a rash. The rash may be red or purple and then turn into blisters or peeling of the skin. Or, you might notice a red rash with swelling of the face, lips or lymph nodes in your neck or under your arms. Patients and their families should watch out for new or worsening thoughts of suicide or depression. Also watch out for sudden changes in feelings such as feeling anxious, agitated, panicky, irritable, hostile, aggressive, impulsive, severely restless, overly excited and hyperactive, or not being able to sleep. If this happens, especially at the beginning of treatment or after a change in dose, call your healthcare provider. Avoid alcoholic drinks while taking this medicine. Drinking large amounts of alcoholic beverages, using sleeping or anxiety medicines, or quickly stopping the use of these agents while taking this medicine may increase your risk for a seizure. Do not drive or use heavy machinery until you know how this medicine affects you. This medicine can impair your ability to perform these tasks. Do not take this medicine close to bedtime. It may prevent you from sleeping. Your mouth may get dry. Chewing sugarless gum or sucking hard candy, and drinking plenty of water may help. Contact your doctor if the problem does not go away or is severe. The tablet shell for some brands of this medicine  does not dissolve. This is normal. The tablet shell may appear whole in the stool. This is not a cause for concern. What side effects may I notice from receiving this medicine? Side effects that you should report to your doctor or health care professional as soon as possible:  allergic reactions like skin rash, itching or hives, swelling of the face, lips, or tongue  breathing problems  changes in vision  confusion  elevated mood, decreased need for sleep, racing thoughts, impulsive behavior  fast or irregular heartbeat  hallucinations, loss of contact with reality  increased blood pressure  rash, fever, and swollen lymph nodes  redness, blistering, peeling or loosening of the skin, including inside the mouth  seizures  suicidal thoughts or other mood changes  unusually weak or tired  vomiting Side effects that usually do not require medical attention (report to your doctor or health care professional if they continue or are bothersome):  constipation  headache  loss of appetite  nausea  tremors  weight loss This list may not describe all possible side effects. Call your doctor for medical advice about side effects. You may report side effects to FDA at 1-800-FDA-1088. Where should I keep my medicine? Keep out of the reach of children. Store at room temperature between 15 and 30 degrees C (59 and 86 degrees F). Throw away any unused medicine after  the expiration date. NOTE: This sheet is a summary. It may not cover all possible information. If you have questions about this medicine, talk to your doctor, pharmacist, or health care provider.  2020 Elsevier/Gold Standard (2018-03-20 13:45:31) Smoking Tobacco Information, Adult Smoking tobacco can be harmful to your health. Tobacco contains a poisonous (toxic), colorless chemical called nicotine. Nicotine is addictive. It changes the brain and can make it hard to stop smoking. Tobacco also has other toxic chemicals  that can hurt your body and raise your risk of many cancers. How can smoking tobacco affect me? Smoking tobacco puts you at risk for:  Cancer. Smoking is most commonly associated with lung cancer, but can also lead to cancer in other parts of the body.  Chronic obstructive pulmonary disease (COPD). This is a long-term lung condition that makes it hard to breathe. It also gets worse over time.  High blood pressure (hypertension), heart disease, stroke, or heart attack.  Lung infections, such as pneumonia.  Cataracts. This is when the lenses in the eyes become clouded.  Digestive problems. This may include peptic ulcers, heartburn, and gastroesophageal reflux disease (GERD).  Oral health problems, such as gum disease and tooth loss.  Loss of taste and smell. Smoking can affect your appearance by causing:  Wrinkles.  Yellow or stained teeth, fingers, and fingernails. Smoking tobacco can also affect your social life, because:  It may be challenging to find places to smoke when away from home. Many workplaces, [employer], hotels, and public places are tobacco-free.  Smoking is expensive. This is due to the cost of tobacco and the long-term costs of treating health problems from smoking.  Secondhand smoke may affect those around you. Secondhand smoke can cause lung cancer, breathing problems, and heart disease. Children of smokers have a higher risk for: ? Sudden infant death syndrome (SIDS). ? Ear infections. ? Lung infections. If you currently smoke tobacco, quitting now can help you:  Lead a longer and healthier life.  Look, smell, breathe, and feel better over time.  Save money.  Protect others from the harms of secondhand smoke. What actions can I take to prevent health problems? Quit smoking   Do not start smoking. Quit if you already do.  Make a plan to quit smoking and commit to it. Look for programs to help you and ask your health care provider for recommendations  and ideas.  Set a date and write down all the reasons you want to quit.  Let your friends and family know you are quitting so they can help and support you. Consider finding friends who also want to quit. It can be easier to quit with someone else, so that you can support each other.  Talk with your health care provider about using nicotine replacement medicines to help you quit, such as gum, lozenges, patches, sprays, or pills.  Do not replace cigarette smoking with electronic cigarettes, which are commonly called e-cigarettes. The safety of e-cigarettes is not known, and some may contain harmful chemicals.  If you try to quit but return to smoking, stay positive. It is common to slip up when you first quit, so take it one day at a time.  Be prepared for cravings. When you feel the urge to smoke, chew gum or suck on hard candy. Lifestyle  Stay busy and take care of your body.  Drink enough fluid to keep your urine pale yellow.  Get plenty of exercise and eat a healthy diet. This can help prevent weight  gain after quitting.  Monitor your eating habits. Quitting smoking can cause you to have a larger appetite than when you smoke.  Find ways to relax. Go out with friends or family to a movie or a restaurant where people do not smoke.  Ask your health care provider about having regular tests (screenings) to check for cancer. This may include blood tests, imaging tests, and other tests.  Find ways to manage your stress, such as meditation, yoga, or exercise. Where to find support To get support to quit smoking, consider:  Asking your health care provider for more information and resources.  Taking classes to learn more about quitting smoking.  Looking for local organizations that offer resources about quitting smoking.  Joining a support group for people who want to quit smoking in your local community.  Calling the smokefree.gov counselor helpline: 1-800-Quit-Now  (865)395-3081) Where to find more information You may find more information about quitting smoking from:  HelpGuide.org: www.helpguide.org  https://hall.com/: smokefree.gov  American Lung Association: www.lung.org Contact a health care provider if you:  Have problems breathing.  Notice that your lips, nose, or fingers turn blue.  Have chest pain.  Are coughing up blood.  Feel faint or you pass out.  Have other health changes that cause you to worry. Summary  Smoking tobacco can negatively affect your health, the health of those around you, your finances, and your social life.  Do not start smoking. Quit if you already do. If you need help quitting, ask your health care provider.  Think about joining a support group for people who want to quit smoking in your local community. There are many effective programs that will help you to quit this behavior. This information is not intended to replace advice given to you by your health care provider. Make sure you discuss any questions you have with your health care provider. Document Revised: 09/19/2018 Document Reviewed: 01/10/2016 Elsevier Patient Education  2020 [employer].

## 2019-01-13 NOTE — Progress Notes (Signed)
Provider: Marlowe Sax FNP-C   Dmitriy Gair, Nelda Bucks, NP  Patient Care Team: Navayah Sok, Nelda Bucks, NP as PCP - General (Family Medicine) Cameron Sprang, MD as Consulting Physician (Neurology)  Extended Emergency Contact Information Primary Emergency Contact: [facility] Phone: [phone] Relation: Daughter Interpreter needed? No  Code Status: Full Code  Goals of care: Advanced Directive information Advanced Directives 01/13/2019  Does Patient Have a Medical Advance Directive? No  Does patient want to make changes to medical advance directive? Yes (ED - Information included in AVS)  Would patient like information on creating a medical advance directive? -     Chief Complaint  Patient presents with  . Establish Care    New patient to establish care, patient states she fell 3 weeks ago and hurt back she would like get pain medication   . Quality Metric Gaps    Patient would like to get flu vaccine today , A1c, urine microalbumin   . Medication Management    Daughter would like to discuss something to help patient stop using tobacco     HPI:  Pt is a 69 y.o. female seen today to establish care for medical management of chronic diseases.she is here with her daughter.she states has a medical history of type 2 DM,Hypertension,Emphysema off inhalers for years,Bipolar disorder off lithium for several years,Dementia without any behavioral disturbance and Tobacco use disordershe states fell 3 weeks ago and hurt her lower back.she was seen in the ED Naprosyn ordered.no fracture was suspected so X-ray deferred.she complains of left lower back pain.she completed prescribed medication without any relief.Pain does not radiate down to her leg.she denies any new onset of urine or bowel incontinence,weakness,numbness or tingling.  Hypertension - husband checks blood pressure but did not bring readings to visit today.she denies any headache,dizziness,fatigue,palpitation,chest pain or shortness of  breath.  Type 2 DM - states husband checks CBG in the 500's sometimes.does not follow low carbo diet.Daughter states patient drinks sugar drinks.On metformin 500 mg 24 RH tablet and Novolin 70/30 injection 35 units SQ  With breakfast and dinner.she denies any symptoms of hypo/hyperglycemia.  COPD - states off inhalers for several years.Has occasional cough.smokes 4-5 cigarettes per day and also chews tobacco.Daughter concerned about patient spitting all the time.she denies any shortness of breath or wheezing.  Bipolar disorder - off lithium for many years.No issues mood swings.  Dementia - per daughter patient still able to complete her activity of daily living except husband assist with cooking. No wandering behaviors reported.she has been forgetful.Not on any medication.   Hyperlipidemia - has taken atorvastatin 40 mg tablet in the past but now off medication.No recent  Labs for review.   Health maintenance  She is due for Influenza vaccine and Pneumococcal vaccine and Tdap.she does not recall getting any shingles vaccine.she has never had shingles.  Also due for colonoscopy previous colonoscopy had polyps removal instructed to follow up in 3 years but did not follow up.   States no mammogram for about three years. Last mm done 2015 on chart review.  Dexa scan up to date.  Past Medical History:  Diagnosis Date  . Arthritis   . Bipolar depression ([place])   . COPD (chronic obstructive pulmonary disease) ([place])   . Dementia ([place])   . Diabetes mellitus without complication ([place])   . Elevated LFTs   . Emphysema of lung ([place])   . GERD (gastroesophageal reflux disease)   . History of adenomatous polyps of colon 10/15/2013  . Hyperlipemia   .  Hypertension   . UTI (lower urinary tract infection)    Past Surgical History:  Procedure Laterality Date  . COLONOSCOPY    . ESOPHAGOGASTRODUODENOSCOPY    . TONSILLECTOMY    . TONSILLECTOMY AND ADENOIDECTOMY    . TUBAL LIGATION      No Known  Allergies  Allergies as of 01/13/2019   No Known Allergies     Medication List       Accurate as of January 13, 2019 11:51 AM. If you have any questions, ask your nurse or doctor.        STOP taking these medications   atorvastatin 40 MG tablet Commonly known as: LIPITOR Stopped by: Nelda Bucks Jhett Fretwell, NP   divalproex 250 MG 24 hr tablet Commonly known as: Depakote ER Stopped by: Sandrea Hughs, NP   naproxen 500 MG tablet Commonly known as: NAPROSYN Stopped by: Sandrea Hughs, NP     TAKE these medications   diclofenac Sodium 1 % Gel Commonly known as: VOLTAREN Apply 2 g topically 4 (four) times daily.   Glucerna Liqd Take 237 mLs by mouth.   metFORMIN 500 MG 24 hr tablet Commonly known as: GLUCOPHAGE-XR TAKE ONE TABLET BY MOUTH WITH BREAKFAST AND ONE WITH SUPPER FOR  DIABETES   NovoLIN 70/30 (70-30) 100 UNIT/ML injection Generic drug: insulin NPH-regular Human Inject 35 units with breakfast and dinner       Review of Systems  Constitutional: Negative for appetite change, chills, fatigue, fever and unexpected weight change.  HENT: Positive for dental problem. Negative for congestion, rhinorrhea, sinus pressure, sinus pain, sneezing, sore throat and trouble swallowing.        No teeth   Eyes: Negative for discharge, redness, itching and visual disturbance.  Respiratory: Negative for cough, chest tightness, shortness of breath and wheezing.   Cardiovascular: Negative for chest pain, palpitations and leg swelling.  Gastrointestinal: Negative for abdominal distention, abdominal pain, constipation, diarrhea, nausea and vomiting.  Endocrine: Negative for cold intolerance, heat intolerance, polydipsia, polyphagia and polyuria.  Genitourinary: Negative for difficulty urinating, dysuria, flank pain, frequency, urgency and vaginal bleeding.  Musculoskeletal: Positive for arthralgias and back pain. Negative for gait problem and myalgias.  Skin: Negative for color change,  pallor, rash and wound.  Neurological: Negative for dizziness, speech difficulty, weakness, light-headedness, numbness and headaches.  Hematological: Negative for adenopathy. Does not bruise/bleed easily.  Psychiatric/Behavioral: Negative for agitation, behavioral problems and sleep disturbance. The patient is not nervous/anxious.        Forgetful     Immunization History  Administered Date(s) Administered  . Fluad Quad(high Dose 65+) 01/13/2019  . Influenza,inj,Quad PF,6+ Mos 09/28/2016  . PPD Test 06/16/2013   Pertinent  Health Maintenance Due  Topic Date Due  . OPHTHALMOLOGY EXAM  10/19/1960  . MAMMOGRAM  10/09/2015  . PNA vac Low Risk Adult (1 of 2 - PCV13) 10/20/2015  . COLONOSCOPY  10/15/2016  . FOOT EXAM  12/21/2017  . URINE MICROALBUMIN  12/28/2017  . HEMOGLOBIN A1C  04/03/2018  . INFLUENZA VACCINE  Completed  . DEXA SCAN  Completed   Fall Risk  01/13/2019 10/16/2017 08/30/2017 12/28/2016 12/21/2016  Falls in the past year? 1 No No No No  Number falls in past yr: 0 - - - -  Injury with Fall? 1 - - - -  Risk for fall due to : - - Mental status change - -    Vitals:   01/13/19 1106  BP: 112/80  Pulse: 95  Resp: 18  Temp: (!) 97.1 F (36.2 C)  TempSrc: Temporal  SpO2: 96%  Weight: 135 lb 9.6 oz (61.5 kg)  Height: '5\' 3"'$  (1.6 m)   Body mass index is 24.02 kg/m. Physical Exam Vitals reviewed.  Constitutional:      General: She is not in acute distress.    Appearance: She is normal weight. She is not ill-appearing.  HENT:     Head: Normocephalic.     Right Ear: There is impacted cerumen.     Left Ear: Tympanic membrane, ear canal and external ear normal. There is no impacted cerumen.     Nose: Nose normal. No congestion or rhinorrhea.     Mouth/Throat:     Mouth: Mucous membranes are moist.     Pharynx: Oropharynx is clear. No oropharyngeal exudate or posterior oropharyngeal erythema.  Eyes:     General: No scleral icterus.       Right eye: No discharge.         Left eye: No discharge.     Extraocular Movements: Extraocular movements intact.     Conjunctiva/sclera: Conjunctivae normal.     Pupils: Pupils are equal, round, and reactive to light.     Comments: Pale yellowish lumps on bilateral upper eyelids.  Neck:     Vascular: No carotid bruit.  Cardiovascular:     Rate and Rhythm: Normal rate and regular rhythm.     Pulses: Normal pulses.     Heart sounds: Normal heart sounds. No murmur. No friction rub. No gallop.   Pulmonary:     Effort: Pulmonary effort is normal. No respiratory distress.     Breath sounds: Normal breath sounds. No wheezing, rhonchi or rales.  Chest:     Chest wall: No tenderness.  Abdominal:     General: Bowel sounds are normal. There is no distension.     Palpations: Abdomen is soft. There is no mass.     Tenderness: There is no abdominal tenderness. There is no right CVA tenderness, left CVA tenderness, guarding or rebound.  Musculoskeletal:        General: No swelling. Normal range of motion.     Cervical back: Normal range of motion. No rigidity or tenderness.     Lumbar back: Tenderness present. No swelling or edema. Negative right straight leg raise test and negative left straight leg raise test.       Back:     Right lower leg: No edema.     Left lower leg: No edema.     Comments: Bilateral feet overgrown toenails and callus   Lymphadenopathy:     Cervical: No cervical adenopathy.  Skin:    General: Skin is warm and dry.     Coloration: Skin is not pale.     Findings: No bruising, erythema or rash.  Neurological:     Mental Status: She is alert and oriented to person, place, and time.     Cranial Nerves: No cranial nerve deficit.     Sensory: No sensory deficit.     Motor: No weakness.     Coordination: Coordination normal.     Gait: Gait normal.  Psychiatric:        Mood and Affect: Mood normal.        Speech: Speech normal.        Behavior: Behavior normal.        Thought Content: Thought  content normal.        Cognition and Memory: Memory is impaired.  Judgment: Judgment normal.    Labs reviewed: No results for input(s): NA, K, CL, CO2, GLUCOSE, BUN, CREATININE, CALCIUM, MG, PHOS in the last 8760 hours. No results for input(s): AST, ALT, ALKPHOS, BILITOT, PROT, ALBUMIN in the last 8760 hours. No results for input(s): WBC, NEUTROABS, HGB, HCT, MCV, PLT in the last 8760 hours. Lab Results  Component Value Date   TSH 0.50 01/17/2017   Lab Results  Component Value Date   HGBA1C 15.7 (H) 10/03/2017   Lab Results  Component Value Date   CHOL 306 (H) 10/03/2017   HDL 53.80 10/03/2017   LDLCALC 225 (H) 10/03/2017   TRIG 134.0 10/03/2017   CHOLHDL 6 10/03/2017    Significant Diagnostic Results in last 30 days:  No results found.  Assessment/Plan 1. Need for influenza vaccination Afebrile.No signs of URI's. - Flu Vaccine QUAD High Dose(Fluad) administered by CMA Ruthell Rummage.Education information on Influenza vaccine provided.  2. Type 2 diabetes mellitus without complication, with long-term current use of insulin (HCC) Reports uncontrolled CBG no log for review. Dietary and lifestyle modification advised.Encouraged to drink water instead of sodas. - continue on Metformin  500 mg 24 RH tablet and Novolin 70/30 injection 35 units SQ  With breakfast and dinner.will obtain lab work this visit. - Hemoglobin A1c - TSH - Ambulatory referral to Podiatry for annual diabetic foot exam. - Ambulatory referral to Ophthalmology for annual eye exam  - metFORMIN (GLUCOPHAGE-XR) 500 MG 24 hr tablet; TAKE ONE TABLET BY MOUTH WITH BREAKFAST AND ONE WITH SUPPER FOR  DIABETES  Dispense: 60 tablet; Refill: 3 - NOVOLIN 70/30 (70-30) 100 UNIT/ML injection; Inject 35 units with breakfast and dinner  Dispense: 10 mL; Refill: 3 - Microalbumin / creatinine urine ratio  3. Essential hypertension B/p at goal today.off medication.will consider adding ASA and ACE inhibitor/ARB for  renal protection on next visit. - CBC with Differential/Platelet - CMP with eGFR([facility])  4. Bipolar 1 disorder, depressed ([place]) Off lithium.No mood swings reported.will continue to monitor for now.  5. Pulmonary emphysema, unspecified emphysema type (HCC) Breathing stable.smoking cessation advised.   6. Chronic left-sided low back pain without sciatica Left side lower back slight tender to palpation.instructed to apply warm compressor 5-10 minutes daily.will order x-ray to rule out other acute abnormalities.  - DG Lumbar Spine Complete; Future Advised to get X-ray done at [place] over [place] [facility] [place].will call you with results.  7. Hyperlipidemia LDL goal <70 Dietary and lifestyle modification advised. - Lipid Panel  8. Tobacco abuse disorder smoking cessation advised.Discussed side effects and starting on BuPropion patient agreed.Education information regarding BuPropion provided on AVS. - buPROPion (WELLBUTRIN XL) 150 MG 24 hr tablet; Take 1 tablet (150 mg total) by mouth daily.  Dispense: 30 tablet; Refill: 0  9. Breast cancer screening by mammogram Asymptomatic. - MM Digital Screening; Future  10. Encounter for screening colonoscopy Due for colonoscopy screening.Agrees for referral to Gastroenterology. - Ambulatory referral to Gastroenterology  11. Need for Tdap vaccination Due for Tdap vaccine.Advised to get vaccine at her pharmacy.Scrip e-scribed to her pharmacy. - Tdap (BOOSTRIX) 5-2.5-18.5 LF-MCG/0.5 injection; Inject 0.5 mLs into the muscle once for 1 dose.  Dispense: 0.5 mL; Refill: 0  12. Need for pneumococcal vaccination Afebrile.Has never received her PNA- 13 vaccine. - Pneumococcal conjugate vaccine 13-valent IM administered by CMA.Education information on pneumonia vaccine provided.  13. Overgrown toenails Bilateral legs toenails overgrown.Refered to podiatrist for annual foot exam and trim overgrown toenails.  14. Callus Bilateral plantar  callus.refer  to Podiatrist for evaluation.   15. Xanthelasma of eyelid, bilateral Bilateral eyelid. - lipid panel   Family/ staff Communication: Reviewed plan of care with patient.  Labs/tests ordered:   - Hemoglobin A1C - TSH - CBC with Differential/Platelet - CMP with eGFR([facility]) - Lipid Panel - Microalbumin / creatinine urine ratio - DG Lumbar Spine Complete; Future - MM Digital Screening; Future  Sandrea Hughs, NP

## 2019-01-14 LAB — COMPLETE METABOLIC PANEL WITH GFR
AG Ratio: 1.7 (calc) (ref 1.0–2.5)
ALT: 21 U/L (ref 6–29)
AST: 14 U/L (ref 10–35)
Albumin: 4.6 g/dL (ref 3.6–5.1)
Alkaline phosphatase (APISO): 184 U/L — ABNORMAL HIGH (ref 37–153)
BUN: 16 mg/dL (ref 7–25)
CO2: 25 mmol/L (ref 20–32)
Calcium: 10.3 mg/dL (ref 8.6–10.4)
Chloride: 95 mmol/L — ABNORMAL LOW (ref 98–110)
Creat: 0.79 mg/dL (ref 0.50–0.99)
GFR, Est African American: 89 mL/min/{1.73_m2} (ref >=60)
GFR, Est Non African American: 77 mL/min/{1.73_m2} (ref >=60)
Globulin: 2.7 g/dL (calc) (ref 1.9–3.7)
Glucose, Bld: 349 mg/dL — ABNORMAL HIGH (ref 65–99)
Potassium: 4.9 mmol/L (ref 3.5–5.3)
Sodium: 133 mmol/L — ABNORMAL LOW (ref 135–146)
Total Bilirubin: 0.3 mg/dL (ref 0.2–1.2)
Total Protein: 7.3 g/dL (ref 6.1–8.1)

## 2019-01-14 LAB — CBC WITH DIFFERENTIAL/PLATELET
Absolute Monocytes: 623 cells/uL (ref 200–950)
Basophils Absolute: 58 cells/uL (ref 0–200)
Basophils Relative: 0.7 %
Eosinophils Absolute: 91 cells/uL (ref 15–500)
Eosinophils Relative: 1.1 %
HCT: 44.5 % (ref 35.0–45.0)
Hemoglobin: 14.1 g/dL (ref 11.7–15.5)
Lymphs Abs: 2000 cells/uL (ref 850–3900)
MCH: 26.2 pg — ABNORMAL LOW (ref 27.0–33.0)
MCHC: 31.7 g/dL — ABNORMAL LOW (ref 32.0–36.0)
MCV: 82.7 fL (ref 80.0–100.0)
MPV: 12.2 fL (ref 7.5–12.5)
Monocytes Relative: 7.5 %
Neutro Abs: 5528 cells/uL (ref 1500–7800)
Neutrophils Relative %: 66.6 %
Platelets: 294 10*3/uL (ref 140–400)
RBC: 5.38 10*6/uL — ABNORMAL HIGH (ref 3.80–5.10)
RDW: 12.3 % (ref 11.0–15.0)
Total Lymphocyte: 24.1 %
WBC: 8.3 10*3/uL (ref 3.8–10.8)

## 2019-01-14 LAB — LIPID PANEL
Cholesterol: 371 mg/dL — ABNORMAL HIGH (ref <200)
HDL: 59 mg/dL (ref >=50)
LDL Cholesterol (Calc): 274 mg/dL (calc) — ABNORMAL HIGH
Non-HDL Cholesterol (Calc): 312 mg/dL (calc) — ABNORMAL HIGH (ref <130)
Total CHOL/HDL Ratio: 6.3 (calc) — ABNORMAL HIGH (ref <5.0)
Triglycerides: 204 mg/dL — ABNORMAL HIGH (ref <150)

## 2019-01-14 LAB — HEMOGLOBIN A1C: Hgb A1c MFr Bld: >14 % of total Hgb — ABNORMAL HIGH (ref <5.7)

## 2019-01-14 LAB — MICROALBUMIN / CREATININE URINE RATIO
Creatinine, Urine: 49 mg/dL (ref 20–275)
Microalb Creat Ratio: 29 mcg/mg creat (ref <30)
Microalb, Ur: 1.4 mg/dL

## 2019-01-14 LAB — TSH: TSH: 0.51 mIU/L (ref 0.40–4.50)

## 2019-01-19 ENCOUNTER — Ambulatory Visit: Payer: Medicare HMO | Admitting: Podiatry

## 2019-01-20 ENCOUNTER — Encounter: Payer: Self-pay | Admitting: *Deleted

## 2019-01-23 ENCOUNTER — Other Ambulatory Visit: Payer: Self-pay | Admitting: Family

## 2019-01-23 ENCOUNTER — Telehealth: Payer: Self-pay | Admitting: *Deleted

## 2019-01-23 DIAGNOSIS — Z794 Long term (current) use of insulin: Secondary | ICD-10-CM

## 2019-01-23 DIAGNOSIS — E119 Type 2 diabetes mellitus without complications: Secondary | ICD-10-CM

## 2019-01-23 MED ORDER — ONETOUCH ULTRA 2 W/DEVICE KIT: PACK | 0 refills | Status: DC

## 2019-01-23 MED ORDER — METFORMIN HCL 1000 MG PO TABS: 1000.0000 mg | ORAL_TABLET | Freq: Two times a day (BID) | ORAL | 1 refills | Status: DC

## 2019-01-23 MED ORDER — ONETOUCH ULTRA VI STRP: ORAL_STRIP | 1 refills | Status: DC

## 2019-01-23 MED ORDER — ONETOUCH ULTRASOFT LANCETS MISC: 1 refills | Status: DC

## 2019-01-23 NOTE — Telephone Encounter (Signed)
-----   Message from Despina Hidden, [place] sent at 01/22/2019 11:27 AM EST -----  ----- Message ----- From: Lauree Chandler, NP Sent: 01/20/2019   4:06 PM EST To: [facility] Pool  Blood counts are stable, Hgb A1c is very high would strongly recommend referral to endocrinologist at this time. Would increase metformin to 1000 mg by mouth twice daily but she will likely also need adjustment in insulin as well.  Sodium level is low (but stable) and likely related to uncontrolled diabetes

## 2019-01-23 NOTE — Telephone Encounter (Signed)
Spoke with daughter and advised results. rx sent to pharmacy by e-script Med list updated

## 2019-02-10 ENCOUNTER — Ambulatory Visit: Payer: Medicare HMO | Admitting: Family

## 2019-02-11 ENCOUNTER — Other Ambulatory Visit: Payer: Self-pay | Admitting: Family

## 2019-02-11 DIAGNOSIS — Z720 Tobacco use: Secondary | ICD-10-CM

## 2019-02-17 ENCOUNTER — Encounter: Payer: Self-pay | Admitting: Internal Medicine

## 2019-02-27 ENCOUNTER — Telehealth: Payer: Self-pay | Admitting: Family

## 2019-02-27 ENCOUNTER — Encounter: Payer: Self-pay | Admitting: Family

## 2019-02-27 NOTE — Telephone Encounter (Signed)
No answering machine, no VM, will try again later.

## 2019-02-27 NOTE — Telephone Encounter (Signed)
Emailed received from [facility] tried to reach patient to schedule for colonoscopy but patient not answering her phone.Please call patient and ask her to call [facility].

## 2019-03-10 ENCOUNTER — Ambulatory Visit: Payer: Medicare HMO | Admitting: Podiatry

## 2019-03-16 ENCOUNTER — Ambulatory Visit: Payer: Medicare HMO | Admitting: Internal Medicine

## 2019-04-03 ENCOUNTER — Ambulatory Visit: Payer: Medicare HMO | Admitting: Internal Medicine

## 2019-04-03 DIAGNOSIS — Z0289 Encounter for other administrative examinations: Secondary | ICD-10-CM

## 2019-04-03 NOTE — Progress Notes (Deleted)
 Name: Kathryn Mcguire  MRN/ DOB: [id number], 05/24/1950   Age/ Sex: 68 y.o., female    PCP: [name], [name] C, NP   Reason for Endocrinology Evaluation: Type 2 Diabetes Mellitus     Date of Initial Endocrinology Visit: 04/03/2019     PATIENT IDENTIFIER: Kathryn Mcguire is a 68 y.o. female with a past medical history of T2DM, HTN and COPD. The patient presented for initial endocrinology clinic visit on 04/03/2019 for consultative assistance with her diabetes management.    HPI: Kathryn Mcguire was    Diagnosed with DM *** Prior Medications tried/Intolerance: *** Currently checking blood sugars *** x / day,  before breakfast and ***.  Hypoglycemia episodes : ***               Symptoms: ***                 Frequency: ***/  Hemoglobin A1c has ranged from *** in ***, peaking at *** in ***. Patient required assistance for hypoglycemia:  Patient has required hospitalization within the last 1 year from hyper or hypoglycemia:   In terms of diet, the patient ***   HOME DIABETES REGIMEN: Basal: ***  Bolus: ***   Statin: {Yes/No:11203} ACE-I/ARB: {YES/NO:17245} Prior Diabetic Education: {Yes/No:11203}   METER DOWNLOAD SUMMARY: Date range evaluated: *** Fingerstick Blood Glucose Tests = *** Average Number Tests/Day = *** Overall Mean FS Glucose = *** Standard Deviation = ***  BG Ranges: Low = *** High = ***   Hypoglycemic Events/30 Days: BG < 50 = *** Episodes of symptomatic severe hypoglycemia = ***   DIABETIC COMPLICATIONS: Microvascular complications:   ***  Denies: ***  Last eye exam: Completed   Macrovascular complications:   ***  Denies: CAD, PVD, CVA   PAST HISTORY: Past Medical History:  Past Medical History:  Diagnosis Date  . Arthritis   . Bipolar depression (HCC)   . COPD (chronic obstructive pulmonary disease) (HCC)   . Dementia (HCC)   . Diabetes mellitus without complication (HCC)   . Elevated LFTs   . Emphysema of lung (HCC)   .  GERD (gastroesophageal reflux disease)   . History of adenomatous polyps of colon 10/15/2013  . Hyperlipemia   . Hypertension   . UTI (lower urinary tract infection)     Past Surgical History:  Past Surgical History:  Procedure Laterality Date  . COLONOSCOPY    . ESOPHAGOGASTRODUODENOSCOPY    . TONSILLECTOMY    . TONSILLECTOMY AND ADENOIDECTOMY    . TUBAL LIGATION        Social History:  reports that she has been smoking cigarettes. She has been smoking about 5.00 packs per day. Her smokeless tobacco use includes chew. She reports previous alcohol use. She reports that she does not use drugs. Family History:  Family History  Problem Relation Age of Onset  . Diabetes Mother   . Lung cancer Sister   . Diabetes Daughter   . Lupus Daughter   . Migraines Daughter        chronic  . Bursitis Daughter   . Colon cancer Neg Hx   . Stomach cancer Neg Hx   . Esophageal cancer Neg Hx   . Rectal cancer Neg Hx       HOME MEDICATIONS: Allergies as of 04/03/2019   No Known Allergies     Medication List       Accurate as of April 03, 2019  7:34 AM. If you have any questions, ask   your nurse or doctor.        buPROPion 150 MG 24 hr tablet Commonly known as: WELLBUTRIN XL TAKE 1 TABLET BY MOUTH EVERY DAY   diclofenac Sodium 1 % Gel Commonly known as: VOLTAREN Apply 2 g topically 4 (four) times daily.   Glucerna Liqd Take 237 mLs by mouth.   metFORMIN 1000 MG tablet Commonly known as: GLUCOPHAGE Take 1 tablet (1,000 mg total) by mouth 2 (two) times daily with a meal.   NovoLIN 70/30 (70-30) 100 UNIT/ML injection Generic drug: insulin NPH-regular Human Inject 35 units with breakfast and dinner   ONE TOUCH ULTRA 2 w/Device Kit Use to test blood sugar once daily DX: E11.9   OneTouch Ultra test strip Generic drug: glucose blood Use to test blood sugar once daily DX: E11.9   onetouch ultrasoft lancets Use to test blood sugar once daily DX: E11.9         ALLERGIES: No Known Allergies   REVIEW OF SYSTEMS: A comprehensive ROS was conducted with the patient and is negative except as per HPI and below:  ROS    OBJECTIVE:   VITAL SIGNS: There were no vitals taken for this visit.   PHYSICAL EXAM:  General: Pt appears well and is in NAD  Hydration: Well-hydrated with moist mucous membranes and good skin turgor  HEENT: Head: Unremarkable with good dentition. Oropharynx clear without exudate.  Eyes: External eye exam normal without stare, lid lag or exophthalmos.  EOM intact.  PERRL.  Neck: General: Supple without adenopathy or carotid bruits. Thyroid: Thyroid size normal.  No goiter or nodules appreciated. No thyroid bruit.  Lungs: Clear with good BS bilat with no rales, rhonchi, or wheezes  Heart: RRR with normal S1 and S2 and no gallops; no murmurs; no rub  Abdomen: Normoactive bowel sounds, soft, nontender, without masses or organomegaly palpable  Extremities:  Lower extremities - No pretibial edema. No lesions.  Skin: Normal texture and temperature to palpation. No rash noted. No Acanthosis nigricans/skin tags. No lipohypertrophy.  Neuro: MS is good with appropriate affect, pt is alert and Ox3    DM foot exam:    DATA REVIEWED:  Lab Results  Component Value Date   HGBA1C >14.0 (H) 01/13/2019   HGBA1C 15.7 (H) 10/03/2017   HGBA1C 15.1 (H) 12/28/2016   Lab Results  Component Value Date   MICROALBUR 1.4 01/13/2019   LDLCALC 274 (H) 01/13/2019   CREATININE 0.79 01/13/2019   Lab Results  Component Value Date   MICRALBCREAT 29 01/13/2019    Lab Results  Component Value Date   CHOL 371 (H) 01/13/2019   HDL 59 01/13/2019   LDLCALC 274 (H) 01/13/2019   TRIG 204 (H) 01/13/2019   CHOLHDL 6.3 (H) 01/13/2019        ASSESSMENT / PLAN / RECOMMENDATIONS:   1) Type *** Diabetes Mellitus, ***controlled, With*** complications - Most recent A1c of *** %. Goal A1c < *** %.   ***  Plan: GENERAL:  ***  MEDICATIONS:  ***  EDUCATION / INSTRUCTIONS:  BG monitoring instructions: Patient is instructed to check her blood sugars *** times a day, ***.  Call [facility] if: BG persistently < 70 or > 300. . I reviewed the Rule of 15 for the treatment of hypoglycemia in detail with the patient. Literature supplied.   2) Diabetic complications:   Eye: Does *** have known diabetic retinopathy.   Neuro/ Feet: Does *** have known diabetic peripheral neuropathy.  Renal: Patient does *** have   known baseline CKD. She is *** on an ACEI/ARB at present.Check urine albumin/creatinine ratio yearly starting at time of diagnosis. If albuminuria is positive, treatment is geared toward better glucose, blood pressure control and use of ACE inhibitors or ARBs. Monitor electrolytes and creatinine once to twice yearly.   3) Lipids: Patient is *** on a statin.    4) Hypertension: ***  at goal of < 140/90 mmHg.       Signed electronically by: Mack Guise, MD  [facility]  [facility] [address] [place], [place] [address] Phone: [phone] FAX: [phone]   CC: Sandrea Hughs, NP [place] [address] Phone: [phone]  Fax: [phone]    Return to Endocrinology clinic as below: Future Appointments  Date Time Provider [place]  04/03/2019  9:50 AM Ocie Tino, Melanie Crazier, MD [facility] None

## 2019-08-18 ENCOUNTER — Ambulatory Visit: Payer: Medicare HMO | Admitting: Family

## 2019-08-25 ENCOUNTER — Ambulatory Visit: Payer: Medicare HMO | Admitting: Family

## 2019-09-18 DIAGNOSIS — Z20828 Contact with and (suspected) exposure to other viral communicable diseases: Secondary | ICD-10-CM | POA: Diagnosis not present

## 2019-12-16 ENCOUNTER — Other Ambulatory Visit: Payer: Self-pay | Admitting: Family

## 2019-12-16 ENCOUNTER — Telehealth: Payer: Self-pay

## 2019-12-16 DIAGNOSIS — Z794 Long term (current) use of insulin: Secondary | ICD-10-CM

## 2019-12-16 DIAGNOSIS — I10 Essential (primary) hypertension: Secondary | ICD-10-CM

## 2019-12-16 DIAGNOSIS — E785 Hyperlipidemia, unspecified: Secondary | ICD-10-CM

## 2019-12-16 DIAGNOSIS — E119 Type 2 diabetes mellitus without complications: Secondary | ICD-10-CM

## 2019-12-16 NOTE — Telephone Encounter (Signed)
Labs ordered.

## 2019-12-16 NOTE — Telephone Encounter (Signed)
Just spoke to patient daughter Jakyah Bradby who called and states that phone number has changed. Patient is scheduled for Fasting Labs 12/18/2019 and Routine Follow Up 12/22/2019. Orders for Lab not placed. Please Add Labs.

## 2019-12-18 ENCOUNTER — Other Ambulatory Visit: Payer: Self-pay

## 2019-12-22 ENCOUNTER — Ambulatory Visit: Payer: Self-pay | Admitting: Family

## 2019-12-28 ENCOUNTER — Other Ambulatory Visit: Payer: Self-pay

## 2019-12-30 ENCOUNTER — Ambulatory Visit: Payer: Self-pay | Admitting: Family

## 2020-01-18 ENCOUNTER — Other Ambulatory Visit: Payer: Self-pay

## 2020-01-18 ENCOUNTER — Other Ambulatory Visit: Payer: Medicare HMO

## 2020-01-18 DIAGNOSIS — E119 Type 2 diabetes mellitus without complications: Secondary | ICD-10-CM | POA: Diagnosis not present

## 2020-01-18 DIAGNOSIS — I10 Essential (primary) hypertension: Secondary | ICD-10-CM | POA: Diagnosis not present

## 2020-01-18 DIAGNOSIS — Z794 Long term (current) use of insulin: Secondary | ICD-10-CM | POA: Diagnosis not present

## 2020-01-18 DIAGNOSIS — E785 Hyperlipidemia, unspecified: Secondary | ICD-10-CM | POA: Diagnosis not present

## 2020-01-19 LAB — COMPLETE METABOLIC PANEL WITH GFR
AG Ratio: 1.5 (calc) (ref 1.0–2.5)
ALT: 11 U/L (ref 6–29)
AST: 11 U/L (ref 10–35)
Albumin: 4.1 g/dL (ref 3.6–5.1)
Alkaline phosphatase (APISO): 89 U/L (ref 37–153)
BUN: 21 mg/dL (ref 7–25)
CO2: 28 mmol/L (ref 20–32)
Calcium: 10.4 mg/dL (ref 8.6–10.4)
Chloride: 93 mmol/L — ABNORMAL LOW (ref 98–110)
Creat: 0.77 mg/dL (ref 0.50–0.99)
GFR, Est African American: 91 mL/min/{1.73_m2} (ref >=60)
GFR, Est Non African American: 79 mL/min/{1.73_m2} (ref >=60)
Globulin: 2.7 g/dL (calc) (ref 1.9–3.7)
Glucose, Bld: 356 mg/dL — ABNORMAL HIGH (ref 65–99)
Potassium: 4.8 mmol/L (ref 3.5–5.3)
Sodium: 133 mmol/L — ABNORMAL LOW (ref 135–146)
Total Bilirubin: 0.3 mg/dL (ref 0.2–1.2)
Total Protein: 6.8 g/dL (ref 6.1–8.1)

## 2020-01-19 LAB — CBC WITH DIFFERENTIAL/PLATELET
Absolute Monocytes: 689 cells/uL (ref 200–950)
Basophils Absolute: 57 cells/uL (ref 0–200)
Basophils Relative: 0.7 %
Eosinophils Absolute: 57 cells/uL (ref 15–500)
Eosinophils Relative: 0.7 %
HCT: 43.8 % (ref 35.0–45.0)
Hemoglobin: 14.1 g/dL (ref 11.7–15.5)
Lymphs Abs: 2211 cells/uL (ref 850–3900)
MCH: 26.4 pg — ABNORMAL LOW (ref 27.0–33.0)
MCHC: 32.2 g/dL (ref 32.0–36.0)
MCV: 82 fL (ref 80.0–100.0)
MPV: 12.8 fL — ABNORMAL HIGH (ref 7.5–12.5)
Monocytes Relative: 8.5 %
Neutro Abs: 5087 cells/uL (ref 1500–7800)
Neutrophils Relative %: 62.8 %
Platelets: 302 10*3/uL (ref 140–400)
RBC: 5.34 10*6/uL — ABNORMAL HIGH (ref 3.80–5.10)
RDW: 12.2 % (ref 11.0–15.0)
Total Lymphocyte: 27.3 %
WBC: 8.1 10*3/uL (ref 3.8–10.8)

## 2020-01-19 LAB — HEMOGLOBIN A1C: Hgb A1c MFr Bld: >14 % of total Hgb — ABNORMAL HIGH (ref <5.7)

## 2020-01-19 LAB — LIPID PANEL
Cholesterol: 331 mg/dL — ABNORMAL HIGH (ref <200)
HDL: 43 mg/dL — ABNORMAL LOW (ref >=50)
LDL Cholesterol (Calc): 259 mg/dL (calc) — ABNORMAL HIGH
Non-HDL Cholesterol (Calc): 288 mg/dL (calc) — ABNORMAL HIGH (ref <130)
Total CHOL/HDL Ratio: 7.7 (calc) — ABNORMAL HIGH (ref <5.0)
Triglycerides: 141 mg/dL (ref <150)

## 2020-01-19 LAB — TSH: TSH: 0.73 mIU/L (ref 0.40–4.50)

## 2020-01-20 ENCOUNTER — Ambulatory Visit: Payer: Self-pay | Admitting: Family

## 2020-01-22 ENCOUNTER — Other Ambulatory Visit: Payer: Self-pay

## 2020-01-22 DIAGNOSIS — Z794 Long term (current) use of insulin: Secondary | ICD-10-CM

## 2020-01-22 DIAGNOSIS — E119 Type 2 diabetes mellitus without complications: Secondary | ICD-10-CM

## 2020-01-22 MED ORDER — ATORVASTATIN CALCIUM 10 MG PO TABS: 10.0000 mg | ORAL_TABLET | Freq: Every day | ORAL | 0 refills | Status: DC

## 2020-01-22 MED ORDER — ASPIRIN EC 81 MG PO TBEC: 81.0000 mg | DELAYED_RELEASE_TABLET | Freq: Every day | ORAL | 0 refills | Status: DC

## 2020-02-03 ENCOUNTER — Other Ambulatory Visit: Payer: Self-pay | Admitting: Family

## 2020-02-03 ENCOUNTER — Ambulatory Visit: Payer: Self-pay | Admitting: Family

## 2020-02-03 DIAGNOSIS — E119 Type 2 diabetes mellitus without complications: Secondary | ICD-10-CM

## 2020-02-03 NOTE — Telephone Encounter (Addendum)
Refill request received for Novolin 35 units, but last dosing on med list is for 38 units. Called patient to question which is correct, but noticed later that patient has an OV scheduled for today and will verify dosing at appointment.

## 2020-02-04 ENCOUNTER — Other Ambulatory Visit: Payer: Self-pay

## 2020-02-04 MED ORDER — NOVOLIN 70/30 (70-30) 100 UNIT/ML ~~LOC~~ SUSP: 38.0000 [IU] | Freq: Two times a day (BID) | SUBCUTANEOUS | 0 refills | Status: DC

## 2020-02-16 ENCOUNTER — Ambulatory Visit: Payer: Self-pay | Admitting: Family

## 2020-02-23 ENCOUNTER — Telehealth: Payer: Self-pay | Admitting: Family

## 2020-02-23 NOTE — Telephone Encounter (Signed)
Letter received from patient's Insurance [employer] indicates Relion Novolon 70/30 vial note covered. Patient missed her 6 months follow up appointment.will need to schedule visit prior to any further medication refill.

## 2020-02-23 NOTE — Telephone Encounter (Signed)
Called and spoke with daughter. The car has been broken down and suppose to be fixed this week.   Scheduled an appointment for 03/08/2020 with [facility]

## 2020-03-08 ENCOUNTER — Ambulatory Visit: Payer: Self-pay | Admitting: Family

## 2020-03-14 ENCOUNTER — Telehealth: Payer: Self-pay | Admitting: Family

## 2020-03-14 NOTE — Telephone Encounter (Signed)
Patient has missed her routine appointments too.Has been over one year since she was seen.

## 2020-03-14 NOTE — Telephone Encounter (Signed)
I called patient to schedule AWV and I was unable to leave a vm or schedule

## 2020-03-17 ENCOUNTER — Encounter: Payer: Self-pay | Admitting: Family

## 2020-07-23 ENCOUNTER — Ambulatory Visit (HOSPITAL_COMMUNITY): Payer: Self-pay

## 2020-07-25 ENCOUNTER — Ambulatory Visit (HOSPITAL_COMMUNITY): Payer: Self-pay

## 2020-07-26 ENCOUNTER — Ambulatory Visit (HOSPITAL_COMMUNITY): Payer: Self-pay

## 2021-04-11 ENCOUNTER — Ambulatory Visit: Payer: Medicare HMO | Admitting: Family

## 2021-04-25 ENCOUNTER — Encounter: Payer: Medicare HMO | Admitting: Family

## 2021-04-26 NOTE — Progress Notes (Signed)
  This encounter was created in error - please disregard. No show 

## 2021-05-08 ENCOUNTER — Ambulatory Visit: Payer: Medicare HMO | Admitting: Family

## 2021-05-16 ENCOUNTER — Encounter: Payer: Self-pay | Admitting: Family

## 2021-05-16 ENCOUNTER — Ambulatory Visit (INDEPENDENT_AMBULATORY_CARE_PROVIDER_SITE_OTHER): Payer: Medicare HMO | Admitting: Family

## 2021-05-16 ENCOUNTER — Other Ambulatory Visit: Payer: Self-pay | Admitting: Family

## 2021-05-16 VITALS — BP 100/70 | HR 89 | Temp 97.8°F | Resp 16 | Ht 63.0 in

## 2021-05-16 DIAGNOSIS — E119 Type 2 diabetes mellitus without complications: Secondary | ICD-10-CM

## 2021-05-16 DIAGNOSIS — Z1211 Encounter for screening for malignant neoplasm of colon: Secondary | ICD-10-CM

## 2021-05-16 DIAGNOSIS — Z794 Long term (current) use of insulin: Secondary | ICD-10-CM

## 2021-05-16 DIAGNOSIS — J439 Emphysema, unspecified: Secondary | ICD-10-CM | POA: Diagnosis not present

## 2021-05-16 DIAGNOSIS — Z23 Encounter for immunization: Secondary | ICD-10-CM

## 2021-05-16 DIAGNOSIS — H6121 Impacted cerumen, right ear: Secondary | ICD-10-CM | POA: Diagnosis not present

## 2021-05-16 DIAGNOSIS — E785 Hyperlipidemia, unspecified: Secondary | ICD-10-CM

## 2021-05-16 DIAGNOSIS — Z1231 Encounter for screening mammogram for malignant neoplasm of breast: Secondary | ICD-10-CM

## 2021-05-16 DIAGNOSIS — Z720 Tobacco use: Secondary | ICD-10-CM | POA: Diagnosis not present

## 2021-05-16 DIAGNOSIS — I10 Essential (primary) hypertension: Secondary | ICD-10-CM

## 2021-05-16 DIAGNOSIS — F319 Bipolar disorder, unspecified: Secondary | ICD-10-CM | POA: Diagnosis not present

## 2021-05-16 MED ORDER — BUPROPION HCL ER (XL) 150 MG PO TB24: 150.0000 mg | ORAL_TABLET | Freq: Every day | ORAL | 1 refills | Status: DC

## 2021-05-16 MED ORDER — ATORVASTATIN CALCIUM 10 MG PO TABS: 10.0000 mg | ORAL_TABLET | Freq: Every day | ORAL | 0 refills | Status: DC

## 2021-05-16 MED ORDER — NOVOLIN 70/30 (70-30) 100 UNIT/ML ~~LOC~~ SUSP: 38.0000 [IU] | Freq: Two times a day (BID) | SUBCUTANEOUS | 0 refills | Status: DC

## 2021-05-16 MED ORDER — TETANUS-DIPHTH-ACELL PERTUSSIS 5-2.5-18.5 LF-MCG/0.5 IM SUSP: 0.5000 mL | Freq: Once | INTRAMUSCULAR | 0 refills | Status: AC

## 2021-05-16 MED ORDER — DEBROX 6.5 % OT SOLN: 5.0000 [drp] | Freq: Two times a day (BID) | OTIC | 0 refills | Status: DC

## 2021-05-16 MED ORDER — ONETOUCH ULTRA 2 W/DEVICE KIT: PACK | 0 refills | Status: DC

## 2021-05-16 MED ORDER — ONETOUCH ULTRASOFT LANCETS MISC: 1 refills | Status: DC

## 2021-05-16 MED ORDER — ASPIRIN EC 81 MG PO TBEC: 81.0000 mg | DELAYED_RELEASE_TABLET | Freq: Every day | ORAL | 0 refills | Status: DC

## 2021-05-16 MED ORDER — GLUCERNA PO LIQD: 237.0000 mL | Freq: Two times a day (BID) | ORAL | 3 refills | Status: DC

## 2021-05-16 MED ORDER — METFORMIN HCL 1000 MG PO TABS: 1000.0000 mg | ORAL_TABLET | Freq: Two times a day (BID) | ORAL | 1 refills | Status: DC

## 2021-05-16 MED ORDER — ONETOUCH ULTRA VI STRP: ORAL_STRIP | 1 refills | Status: DC

## 2021-05-16 NOTE — Progress Notes (Signed)
? ?Provider: Marlowe Sax FNP-C  ? ?Kathryn Mcguire, Kathryn Bucks, NP ? ?Patient Care Team: ?Kathryn Mcguire, Kathryn Bucks, NP as PCP - General (Family Medicine) ?Kathryn Sprang, MD as Consulting Physician (Neurology) ? ?Extended Emergency Contact Information ?Primary Emergency Contact: Kathryn Mcguire ?Mobile Phone: [phone] ?Relation: Daughter ?Interpreter needed? No ? ?Code Status: Full code ?Goals of care: Advanced Directive information ? ?  05/16/2021  ?  2:53 PM  ?Advanced Directives  ?Does Patient Have a Medical Advance Directive? No  ?Would patient like information on creating a medical advance directive? No - Patient declined  ? ? ? ?Chief Complaint  ?Patient presents with  ? Medical Management of Chronic Issues  ?  Routine Visit.  ? Health Maintenance  ?  Discuss the need for Eye exam, Foot exam, Mammogram, Hemoglobin A1C, Urine Microalbumin, and Colonoscopy.   ? Immunizations  ?  Discuss the need for Tetanus vaccine, Shingrix vaccine, Covid vaccine, and Pne vaccine.   ? ? ?HPI:  ?Pt is a 71 y.o. female seen today for routine visit for medical management of chronic diseases.  Has not been seen for more than 6 months.  She is here with her daughter.She denies any acute issues.Daughter's request refill for oral medication.  Has a medical history of essential hypertension COPD, GERD, type 2 diabetes, osteoporosis, osteoarthritis, dementia without behavioral disturbances, hyperlipidemia, bipolar 1 disorder depression, vitamin D deficiency among other conditions. ?No home CBG readings for evaluation but recalls blood sugars ranges up and down up to the 200s.  She is due for annual eye and foot exam Will need referral.  Also due for fasting blood work. ?Health maintenance: ?Due for tetanus and Shingrix vaccine and pneumococcal vaccine.  Agrees to get the pneumonia vaccine today.  Made aware to get her tetanus and Shingrix vaccine at her pharmacy. ?Also made aware to get her COVID booster vaccine at the pharmacy. ? ?No recent  hospitalizations since her last visit.  No fall episodes reported ?Unable to check weight this visit due to wearing machine being out of order.  Daughter reports no falls weight loss. Drinks Glucerna daily.Does not skip meals. ?Daughter reports no behavioral issues. ? ?Past Medical History:  ?Diagnosis Date  ? Arthritis   ? Bipolar depression ([place])   ? COPD (chronic obstructive pulmonary disease) ([place])   ? Dementia ([place])   ? Diabetes mellitus without complication (Kathryn Mcguire)   ? Elevated LFTs   ? Emphysema of lung (Kathryn Mcguire)   ? GERD (gastroesophageal reflux disease)   ? History of adenomatous polyps of colon 10/15/2013  ? Hyperlipemia   ? Hypertension   ? UTI (lower urinary tract infection)   ? ?Past Surgical History:  ?Procedure Laterality Date  ? COLONOSCOPY    ? ESOPHAGOGASTRODUODENOSCOPY    ? TONSILLECTOMY    ? TONSILLECTOMY AND ADENOIDECTOMY    ? TUBAL LIGATION    ? ? ?No Known Allergies ? ?Allergies as of 05/16/2021   ?No Known Allergies ?  ? ?  ?Medication List  ?  ? ?  ? Accurate as of May 16, 2021  8:47 PM. If you have any questions, ask your nurse or doctor.  ?  ?  ? ?  ? ?aspirin EC 81 MG tablet ?Take 1 tablet (81 mg total) by mouth daily. Swallow whole. ?  ?atorvastatin 10 MG tablet ?Commonly known as: LIPITOR ?Take 1 tablet (10 mg total) by mouth daily. ?  ?buPROPion 150 MG 24 hr tablet ?Commonly known as: WELLBUTRIN XL ?Take 1 tablet (150 mg total)  by mouth daily. ?  ?Debrox 6.5 % OTIC solution ?Generic drug: carbamide peroxide ?Place 5 drops into the right ear 2 (two) times daily. ?Started by: Sandrea Hughs, NP ?  ?diclofenac Sodium 1 % Gel ?Commonly known as: VOLTAREN ?Apply 2 g topically 4 (four) times daily. ?  ?Glucerna Liqd ?Take 237 mLs by mouth 2 (two) times daily between meals. ?What changed: when to take this ?Changed by: Sandrea Hughs, NP ?  ?metFORMIN 1000 MG tablet ?Commonly known as: GLUCOPHAGE ?Take 1 tablet (1,000 mg total) by mouth 2 (two) times daily with a meal. ?  ?NovoLIN 70/30 (70-30) 100  UNIT/ML injection ?Generic drug: insulin NPH-regular Human ?Inject 38 Units into the skin 2 (two) times daily with a meal. Breakfast and Dinner. ?  ?ONE TOUCH ULTRA 2 w/Device Kit ?USE TO TEST BLOOD SUGAR ONCE DAILY ?What changed: additional instructions ?Changed by: Sandrea Hughs, NP ?  ?OneTouch Ultra test strip ?Generic drug: glucose blood ?USE TO TEST BLOOD SUGAR ONCE DAILY ?What changed: additional instructions ?Changed by: Sandrea Hughs, NP ?  ?onetouch ultrasoft lancets ?Use to test blood sugar once daily DX: E11.9 ?  ?Tdap 5-2.5-18.5 LF-MCG/0.5 injection ?Commonly known as: BOOSTRIX ?Inject 0.5 mLs into the muscle once for 1 dose. ?  ? ?  ? ? ?Review of Systems  ?Constitutional:  Negative for appetite change, chills, fatigue, fever and unexpected weight change.  ?HENT:  Negative for congestion, dental problem, ear discharge, ear pain, facial swelling, hearing loss, nosebleeds, postnasal drip, rhinorrhea, sinus pressure, sinus pain, sneezing, sore throat, tinnitus and trouble swallowing.   ?Eyes:  Negative for pain, discharge, redness, itching and visual disturbance.  ?Respiratory:  Negative for cough, chest tightness, shortness of breath and wheezing.   ?Cardiovascular:  Negative for chest pain, palpitations and leg swelling.  ?Gastrointestinal:  Negative for abdominal distention, abdominal pain, blood in stool, constipation, diarrhea, nausea and vomiting.  ?Endocrine: Negative for cold intolerance, heat intolerance, polydipsia, polyphagia and polyuria.  ?Genitourinary:  Negative for difficulty urinating, dysuria, flank pain, frequency and urgency.  ?Musculoskeletal:  Negative for arthralgias, back pain, gait problem, joint swelling, myalgias, neck pain and neck stiffness.  ?Skin:  Negative for color change, pallor, rash and wound.  ?Neurological:  Negative for dizziness, syncope, speech difficulty, weakness, light-headedness, numbness and headaches.  ?Hematological:  Does not bruise/bleed easily.   ?Psychiatric/Behavioral:  Negative for agitation, behavioral problems, confusion, hallucinations, self-injury, sleep disturbance and suicidal ideas. The patient is not nervous/anxious.   ?     Memory lapse  ? ?Immunization History  ?Administered Date(s) Administered  ? Fluad Quad(high Dose 65+) 01/13/2019  ? Influenza,inj,Quad PF,6+ Mos 09/28/2016  ? PFIZER(Purple Top)SARS-COV-2 Vaccination 08/27/2019  ? PNEUMOCOCCAL CONJUGATE-20 05/16/2021  ? PPD Test 06/16/2013  ? Pneumococcal Conjugate-13 01/13/2019  ? ?Pertinent  Health Maintenance Due  ?Topic Date Due  ? OPHTHALMOLOGY EXAM  Never done  ? MAMMOGRAM  10/09/2015  ? COLONOSCOPY (Pts 45-46yr Insurance coverage will need to be confirmed)  10/15/2016  ? FOOT EXAM  12/21/2017  ? URINE MICROALBUMIN  01/13/2020  ? HEMOGLOBIN A1C  07/17/2020  ? INFLUENZA VACCINE  08/08/2021  ? DEXA SCAN  Completed  ? ? ?  09/19/2017  ?  7:44 PM 10/16/2017  ? 10:34 AM 01/03/2019  ?  2:22 PM 01/13/2019  ? 11:14 AM 05/16/2021  ?  2:53 PM  ?Fall Risk  ?Falls in the past year?  No  1 0  ?Was there an injury with Fall?  1 0  ?Fall Risk Category Calculator    2 0  ?Fall Risk Category    Moderate Low  ?Patient Fall Risk Level Low fall risk  Low fall risk Moderate fall risk Low fall risk  ?Patient at Risk for Falls Due to     No Fall Risks  ?Fall risk Follow up     Falls evaluation completed  ? ?Functional Status Survey: ?  ? ?Vitals:  ? 05/16/21 1447  ?BP: 100/70  ?Pulse: 89  ?Resp: 16  ?Temp: 97.8 ?F (36.6 ?C)  ?SpO2: 93%  ?Height: _0  (1.6 m)  ? ?Body mass index is 24.02 kg/m?Marland Kitchen ?Physical Exam ?Vitals reviewed.  ?Constitutional:   ?   General: She is not in acute distress. ?   Appearance: Normal appearance. She is not ill-appearing or diaphoretic.  ?HENT:  ?   Head: Normocephalic.  ?   Right Ear: There is impacted cerumen.  ?   Left Ear: There is impacted cerumen.  ?   Nose: Nose normal. No congestion or rhinorrhea.  ?   Mouth/Throat:  ?   Mouth: Mucous membranes are moist.  ?   Pharynx:  Oropharynx is clear. No oropharyngeal exudate or posterior oropharyngeal erythema.  ?Eyes:  ?   General: No scleral icterus.    ?   Right eye: No discharge.     ?   Left eye: No discharge.  ?   Extraocular Movements: Extraoc

## 2021-05-16 NOTE — Patient Instructions (Addendum)
?  Please get Tetanus and Shingles vaccine at your pharmacy.  ?- - Instill debrox 6.5 otic solution 5 drops into right ear twice daily x 4 days then follow up for ear lavage.May apply cotton ball at bedtime to prevent drainage to pillow. ? ? ? ? ? ? ? ? ? ? ? ? ? ? ? ? ? ? ? ? ? ? ? ? ? ?Please contact your local pharmacy, previous provider, or insurance carrier for vaccine/immunization records. Ensure that any procedures done outside of [facility] are faxed to [place] [phone] or you can sign release of records form at the front desk to keep your medical record updated.   ? ?

## 2021-05-17 LAB — MICROALBUMIN / CREATININE URINE RATIO
Creatinine, Urine: 26 mg/dL (ref 20–275)
Microalb, Ur: <0.2 mg/dL

## 2021-05-25 ENCOUNTER — Ambulatory Visit: Payer: Medicare HMO | Admitting: Family

## 2021-05-26 ENCOUNTER — Encounter: Payer: Self-pay | Admitting: Family

## 2021-07-06 ENCOUNTER — Encounter: Payer: Medicare HMO | Admitting: Family

## 2021-07-09 NOTE — Progress Notes (Signed)
  This encounter was created in error - please disregard. No show 

## 2021-10-04 ENCOUNTER — Other Ambulatory Visit: Payer: Self-pay | Admitting: Family

## 2021-10-04 DIAGNOSIS — E785 Hyperlipidemia, unspecified: Secondary | ICD-10-CM

## 2021-11-14 ENCOUNTER — Encounter: Payer: Self-pay | Admitting: Family

## 2021-11-15 ENCOUNTER — Encounter: Payer: Medicare HMO | Admitting: Family

## 2021-11-20 ENCOUNTER — Ambulatory Visit: Payer: Medicare HMO | Admitting: Family

## 2021-12-01 NOTE — Progress Notes (Signed)
  This encounter was created in error - please disregard. No show 

## 2021-12-05 ENCOUNTER — Encounter: Payer: Medicare HMO | Admitting: Family

## 2021-12-06 NOTE — Progress Notes (Signed)
  This encounter was created in error - please disregard. No show 

## 2021-12-29 ENCOUNTER — Other Ambulatory Visit: Payer: Self-pay | Admitting: Family

## 2021-12-29 DIAGNOSIS — Z794 Long term (current) use of insulin: Secondary | ICD-10-CM

## 2022-01-08 ENCOUNTER — Other Ambulatory Visit: Payer: Self-pay | Admitting: Family

## 2022-01-08 DIAGNOSIS — E785 Hyperlipidemia, unspecified: Secondary | ICD-10-CM

## 2022-01-31 ENCOUNTER — Other Ambulatory Visit: Payer: Self-pay | Admitting: Family

## 2022-01-31 DIAGNOSIS — E785 Hyperlipidemia, unspecified: Secondary | ICD-10-CM

## 2022-03-27 ENCOUNTER — Other Ambulatory Visit: Payer: Self-pay | Admitting: Family

## 2022-03-27 DIAGNOSIS — Z794 Long term (current) use of insulin: Secondary | ICD-10-CM

## 2022-03-27 DIAGNOSIS — E785 Hyperlipidemia, unspecified: Secondary | ICD-10-CM

## 2022-03-27 MED ORDER — ATORVASTATIN CALCIUM 10 MG PO TABS: 10.0000 mg | ORAL_TABLET | Freq: Every day | ORAL | 5 refills | Status: DC

## 2022-03-27 MED ORDER — ONETOUCH ULTRA 2 W/DEVICE KIT: PACK | 0 refills | Status: DC

## 2022-03-27 MED ORDER — NOVOLIN 70/30 (70-30) 100 UNIT/ML ~~LOC~~ SUSP: 38.0000 [IU] | Freq: Two times a day (BID) | SUBCUTANEOUS | 5 refills | Status: DC

## 2022-04-24 ENCOUNTER — Ambulatory Visit: Payer: Medicare HMO | Admitting: Family

## 2022-05-03 ENCOUNTER — Ambulatory Visit: Payer: Medicare HMO | Admitting: Family

## 2022-05-17 ENCOUNTER — Ambulatory Visit: Payer: Medicare HMO | Admitting: Family

## 2022-05-29 ENCOUNTER — Ambulatory Visit: Payer: Medicare HMO | Admitting: Family

## 2022-06-13 ENCOUNTER — Ambulatory Visit: Payer: Medicare HMO | Admitting: Family

## 2022-06-20 ENCOUNTER — Ambulatory Visit: Payer: Medicare HMO | Admitting: Family

## 2022-06-21 ENCOUNTER — Encounter: Payer: Self-pay | Admitting: *Deleted

## 2022-06-21 NOTE — Progress Notes (Signed)
  [facility] Quality Team Note  Name: Kathryn Mcguire Date of Birth: 1950/05/25 MRN: [id number] Date: 06/21/2022  [facility] Quality Team has reviewed this patient's chart, please see recommendations below:  [facility] Quality Other; After reviewing chart, pt has open gaps for the following: AWV:  2018 BCS:  message was sent to pt in EPIC on 04/25/22 EED:  referral sent 05/2021, no record of completion yet HBD:  pt has scheduled 07/17/22 per appt comment KED:  Would need EGFR and Urine Albumin Creatinine Ratio test ordered  Would provider be able to address gaps at ov on 07/17/2022?

## 2022-07-04 ENCOUNTER — Other Ambulatory Visit: Payer: Self-pay | Admitting: Family

## 2022-07-04 DIAGNOSIS — E119 Type 2 diabetes mellitus without complications: Secondary | ICD-10-CM

## 2022-07-09 ENCOUNTER — Other Ambulatory Visit: Payer: Self-pay | Admitting: Family

## 2022-07-09 DIAGNOSIS — E119 Type 2 diabetes mellitus without complications: Secondary | ICD-10-CM

## 2022-07-09 NOTE — Telephone Encounter (Signed)
Pharmacy comment: Product Backordered/Unavailable:ON BACKORDER!! 

## 2022-07-17 ENCOUNTER — Ambulatory Visit (INDEPENDENT_AMBULATORY_CARE_PROVIDER_SITE_OTHER): Payer: Medicare HMO | Admitting: Family

## 2022-07-17 ENCOUNTER — Telehealth: Payer: Self-pay

## 2022-07-17 ENCOUNTER — Encounter: Payer: Self-pay | Admitting: Family

## 2022-07-17 VITALS — BP 126/78 | HR 88 | Temp 97.8°F | Resp 18 | Ht 63.0 in | Wt 116.6 lb

## 2022-07-17 DIAGNOSIS — Z720 Tobacco use: Secondary | ICD-10-CM | POA: Diagnosis not present

## 2022-07-17 DIAGNOSIS — E119 Type 2 diabetes mellitus without complications: Secondary | ICD-10-CM

## 2022-07-17 DIAGNOSIS — Z1231 Encounter for screening mammogram for malignant neoplasm of breast: Secondary | ICD-10-CM | POA: Diagnosis not present

## 2022-07-17 DIAGNOSIS — Z794 Long term (current) use of insulin: Secondary | ICD-10-CM | POA: Diagnosis not present

## 2022-07-17 DIAGNOSIS — E785 Hyperlipidemia, unspecified: Secondary | ICD-10-CM

## 2022-07-17 DIAGNOSIS — H6121 Impacted cerumen, right ear: Secondary | ICD-10-CM | POA: Diagnosis not present

## 2022-07-17 DIAGNOSIS — I10 Essential (primary) hypertension: Secondary | ICD-10-CM

## 2022-07-17 DIAGNOSIS — F319 Bipolar disorder, unspecified: Secondary | ICD-10-CM | POA: Diagnosis not present

## 2022-07-17 DIAGNOSIS — J439 Emphysema, unspecified: Secondary | ICD-10-CM | POA: Diagnosis not present

## 2022-07-17 LAB — CBC WITH DIFFERENTIAL/PLATELET
Absolute Monocytes: 752 cells/uL (ref 200–950)
Basophils Relative: 0.7 %
Eosinophils Absolute: 94 cells/uL (ref 15–500)
Hemoglobin: 14.1 g/dL (ref 11.7–15.5)
Lymphs Abs: 2519 cells/uL (ref 850–3900)
MCH: 26.4 pg — ABNORMAL LOW (ref 27.0–33.0)
MCHC: 31.6 g/dL — ABNORMAL LOW (ref 32.0–36.0)
MPV: 12.1 fL (ref 7.5–12.5)
Platelets: 255 10*3/uL (ref 140–400)
RBC: 5.34 10*6/uL — ABNORMAL HIGH (ref 3.80–5.10)
RDW: 12.1 % (ref 11.0–15.0)
Total Lymphocyte: 26.8 %

## 2022-07-17 MED ORDER — BUPROPION HCL ER (XL) 150 MG PO TB24: 150.0000 mg | ORAL_TABLET | Freq: Every day | ORAL | 1 refills | Status: DC

## 2022-07-17 MED ORDER — ONETOUCH ULTRA 2 W/DEVICE KIT: PACK | 0 refills | Status: DC

## 2022-07-17 MED ORDER — ONETOUCH ULTRA VI STRP: ORAL_STRIP | 1 refills | Status: DC

## 2022-07-17 MED ORDER — INSULIN PEN NEEDLE 32G X 4 MM MISC
1.0000 | Freq: Two times a day (BID) | 3 refills | Status: DC
Start: 2022-07-17 — End: 2023-07-16

## 2022-07-17 MED ORDER — ONETOUCH ULTRASOFT LANCETS MISC: 1 refills | Status: DC

## 2022-07-17 NOTE — Telephone Encounter (Signed)
Diabetic Meters Test Strips PA 2  Standard Prior Auth    They are asking that one of these meters be ordered. This is for both the kit and test strips Please advise  Message sent to Richarda Blade, NP

## 2022-07-17 NOTE — Telephone Encounter (Signed)
Okay to order requested glucometer and supplies

## 2022-07-17 NOTE — Progress Notes (Signed)
Provider: Richarda Blade FNP-C  [name], [name], NP  Patient Care Team: Tavish Gettis, [name], NP as PCP - General (Family Medicine) Van Clines, MD as Consulting Physician (Neurology)  Extended Emergency Contact Information Primary Emergency Contact: [facility] Phone: [phone] Relation: Daughter Interpreter needed? No  Code Status:  Full Code  Goals of care: Advanced Directive information    07/17/2022   12:44 PM  Advanced Directives  Does Patient Have a Medical Advance Directive? No  Would patient like information on creating a medical advance directive? No - Patient declined     Chief Complaint  Patient presents with   Acute Visit    Needs A1c/insulin refills.    HPI:  Pt is a 72 y.o. female seen today for an acute visit for medication refills.She is here with her daughter who provides additional HPI information. Has not used insulin for the past 5 days did not know how to use Kwipen. No needles ordered.  Also request new diabetic glucometer.  No home CBG's for review. No home B/p readings for review.She denies any headache,dizziness,vision changes,fatigue,chest tightness,palpitation,chest pain or shortness of breath.     Has been exercise by walking daily up the street.  Past Medical History:  Diagnosis Date   Arthritis    Bipolar depression (HCC)    COPD (chronic obstructive pulmonary disease) (HCC)    Dementia (HCC)    Diabetes mellitus without complication (HCC)    Elevated LFTs    Emphysema of lung (HCC)    GERD (gastroesophageal reflux disease)    History of adenomatous polyps of colon 10/15/2013   Hyperlipemia    Hypertension    UTI (lower urinary tract infection)    Past Surgical History:  Procedure Laterality Date   COLONOSCOPY     ESOPHAGOGASTRODUODENOSCOPY     TONSILLECTOMY     TONSILLECTOMY AND ADENOIDECTOMY     TUBAL LIGATION      No Known Allergies  Outpatient Encounter Medications as of 07/17/2022  Medication Sig    aspirin EC 81 MG tablet Take 1 tablet (81 mg total) by mouth daily. Swallow whole.   atorvastatin (LIPITOR) 10 MG tablet Take 1 tablet (10 mg total) by mouth daily. Needs appointment before anymore future refills.   Blood Glucose Monitoring Suppl (ONE TOUCH ULTRA 2) w/Device KIT USE TO TEST BLOOD SUGAR ONCE DAILY   buPROPion (WELLBUTRIN XL) 150 MG 24 hr tablet Take 1 tablet (150 mg total) by mouth daily.   carbamide peroxide (DEBROX) 6.5 % OTIC solution Place 5 drops into the right ear 2 (two) times daily.   diclofenac Sodium (VOLTAREN) 1 % GEL Apply 2 g topically 4 (four) times daily.   Glucerna (GLUCERNA) LIQD Take 237 mLs by mouth 2 (two) times daily between meals.   glucose blood (ONETOUCH ULTRA) test strip USE TO TEST BLOOD SUGAR ONCE DAILY   insulin isophane & regular human KwikPen (HUMULIN 70/30 KWIKPEN) (70-30) 100 UNIT/ML KwikPen 2 units with breakfast and supper   Lancets (ONETOUCH ULTRASOFT) lancets Use to test blood sugar once daily DX: E11.9   metFORMIN (GLUCOPHAGE) 1000 MG tablet TAKE 1 TABLET (1,000 MG TOTAL) BY MOUTH TWICE A DAY WITH FOOD   No facility-administered encounter medications on file as of 07/17/2022.    Review of Systems  Constitutional:  Negative for appetite change, chills, fatigue, fever and unexpected weight change.  HENT:  Negative for congestion, dental problem, ear discharge, ear pain, facial swelling, hearing loss, nosebleeds, postnasal drip, rhinorrhea, sinus pressure, sinus pain,  sneezing, sore throat, tinnitus and trouble swallowing.   Eyes:  Negative for pain, discharge, redness, itching and visual disturbance.  Respiratory:  Negative for cough, chest tightness, shortness of breath and wheezing.   Cardiovascular:  Negative for chest pain, palpitations and leg swelling.  Gastrointestinal:  Negative for abdominal distention, abdominal pain, blood in stool, constipation, diarrhea, nausea and vomiting.  Endocrine: Negative for cold intolerance, heat  intolerance, polydipsia, polyphagia and polyuria.  Genitourinary:  Negative for difficulty urinating, dysuria, flank pain, frequency and urgency.  Musculoskeletal:  Negative for arthralgias, back pain, gait problem, joint swelling, myalgias, neck pain and neck stiffness.  Skin:  Negative for color change, pallor, rash and wound.  Neurological:  Negative for dizziness, syncope, speech difficulty, weakness, light-headedness, numbness and headaches.  Hematological:  Does not bruise/bleed easily.  Psychiatric/Behavioral:  Positive for sleep disturbance. Negative for agitation, behavioral problems, confusion, hallucinations, self-injury and suicidal ideas. The patient is not nervous/anxious.        Sleeps at 8 pm and wakes up at 11 pm     Immunization History  Administered Date(s) Administered   Fluad Quad(high Dose 65+) 01/13/2019   Influenza,inj,Quad PF,6+ Mos 09/28/2016   Influenza-Unspecified 09/08/2013   PFIZER(Purple Top)SARS-COV-2 Vaccination 08/27/2019   PNEUMOCOCCAL CONJUGATE-20 05/16/2021   PPD Test 06/16/2013   Pneumococcal Conjugate-13 01/13/2019   Pertinent  Health Maintenance Due  Topic Date Due   OPHTHALMOLOGY EXAM  Never done   MAMMOGRAM  10/09/2015   Colonoscopy  10/15/2016   FOOT EXAM  12/21/2017   HEMOGLOBIN A1C  07/17/2020   INFLUENZA VACCINE  08/09/2022   DEXA SCAN  Completed      01/03/2019    2:22 PM 01/13/2019   11:14 AM 05/16/2021    2:53 PM 11/14/2021    2:32 PM 07/17/2022   12:52 PM  Fall Risk  Falls in the past year?  1 0 0 0  Was there an injury with Fall?  1 0 0 0  Fall Risk Category Calculator  2 0 0 0  Fall Risk Category (Retired)  Moderate Low Low   (RETIRED) Patient Fall Risk Level Low fall risk Moderate fall risk Low fall risk Low fall risk   Patient at Risk for Falls Due to   No Fall Risks No Fall Risks No Fall Risks  Fall risk Follow up   Falls evaluation completed Falls evaluation completed Falls evaluation completed   Functional Status  Survey:    Vitals:   07/17/22 1252  BP: 126/78  Pulse: 88  Resp: 18  Temp: 97.8 F (36.6 C)  SpO2: 95%  Weight: 116 lb 9.6 oz (52.9 kg)  Height: 5\' 3"  (1.6 m)   Body mass index is 20.65 kg/m. Physical Exam Vitals reviewed.  Constitutional:      General: She is not in acute distress.    Appearance: Normal appearance. She is normal weight. She is not ill-appearing or diaphoretic.  HENT:     Head: Normocephalic.     Right Ear: There is impacted cerumen.     Left Ear: Tympanic membrane, ear canal and external ear normal. There is no impacted cerumen.     Ears:     Comments: Right ear cerumen lavaged with warm water and hydrogen peroxide moderate amounts of cerumen obtained.No instrument used.Tolerated procedure well.TM clear without any signs of infection.     Nose: Nose normal. No congestion or rhinorrhea.     Mouth/Throat:     Mouth: Mucous membranes are moist.  Pharynx: Oropharynx is clear. No oropharyngeal exudate or posterior oropharyngeal erythema.  Eyes:     General: No scleral icterus.       Right eye: No discharge.        Left eye: No discharge.     Extraocular Movements: Extraocular movements intact.     Conjunctiva/sclera: Conjunctivae normal.     Pupils: Pupils are equal, round, and reactive to light.  Neck:     Vascular: No carotid bruit.  Cardiovascular:     Rate and Rhythm: Normal rate and regular rhythm.     Pulses: Normal pulses.     Heart sounds: Normal heart sounds. No murmur heard.    No friction rub. No gallop.  Pulmonary:     Effort: Pulmonary effort is normal. No respiratory distress.     Breath sounds: Normal breath sounds. No wheezing, rhonchi or rales.  Chest:     Chest wall: No tenderness.  Abdominal:     General: Bowel sounds are normal. There is no distension.     Palpations: Abdomen is soft. There is no mass.     Tenderness: There is no abdominal tenderness. There is no right CVA tenderness, left CVA tenderness, guarding or rebound.   Musculoskeletal:        General: No swelling or tenderness. Normal range of motion.     Cervical back: Normal range of motion. No rigidity or tenderness.     Right lower leg: No edema.     Left lower leg: No edema.  Lymphadenopathy:     Cervical: No cervical adenopathy.  Skin:    General: Skin is warm and dry.     Coloration: Skin is not pale.     Findings: No bruising, erythema, lesion or rash.  Neurological:     Mental Status: She is alert and oriented to person, place, and time.     Cranial Nerves: No cranial nerve deficit.     Sensory: No sensory deficit.     Motor: No weakness.     Coordination: Coordination normal.     Gait: Gait normal.  Psychiatric:        Mood and Affect: Mood normal.        Speech: Speech normal.        Behavior: Behavior normal.        Thought Content: Thought content normal.        Judgment: Judgment normal.     Labs reviewed: No results for input(s): "NA", "K", "CL", "CO2", "GLUCOSE", "BUN", "CREATININE", "CALCIUM", "MG", "PHOS" in the last 8760 hours. No results for input(s): "AST", "ALT", "ALKPHOS", "BILITOT", "PROT", "ALBUMIN" in the last 8760 hours. No results for input(s): "WBC", "NEUTROABS", "HGB", "HCT", "MCV", "PLT" in the last 8760 hours. Lab Results  Component Value Date   TSH 0.73 01/18/2020   Lab Results  Component Value Date   HGBA1C >14.0 (H) 01/18/2020   Lab Results  Component Value Date   CHOL 331 (H) 01/18/2020   HDL 43 (L) 01/18/2020   LDLCALC 259 (H) 01/18/2020   TRIG 141 01/18/2020   CHOLHDL 7.7 (H) 01/18/2020    Significant Diagnostic Results in last 30 days:  No results found.  Assessment/Plan 1. Hyperlipidemia LDL goal <70 LDL not at goal  -Continue on atorvastatin -Dietary modification and exercise advised - Lipid panel  2. Type 2 diabetes mellitus without complication, with long-term current use of insulin (HCC) Lab Results  Component Value Date   HGBA1C >14.0 (H) 01/18/2020  -No home CBG for  review  requests new glucometer.  Has not used insulin as prescribed since picked up from pharmacy. -Continue on metformin and Humulin 70/30 KwikPen.  Will need pen needles.  Discussed with daughter during visit how to use pen.  Advised to notify provider or pharmacist if unable to use pen. -On statin and aspirin for cardiovascular event prevention -Will refer to ophthalmology for annual eye exam -Also follow-up with podiatrist for annual foot exam - TSH - COMPLETE METABOLIC PANEL WITH GFR - CBC with Differential/Platelet - Hemoglobin A1c - Blood Glucose Monitoring Suppl (ONE TOUCH ULTRA 2) w/Device KIT; USE TO TEST BLOOD SUGAR ONCE DAILY  Dispense: 1 kit; Refill: 0 - glucose blood (ONETOUCH ULTRA) test strip; USE TO TEST BLOOD SUGAR ONCE DAILY  Dispense: 100 strip; Refill: 1 - Lancets (ONETOUCH ULTRASOFT) lancets; Use to test blood sugar once daily DX: E11.9  Dispense: 100 each; Refill: 1 - Insulin Pen Needle 32G X 4 MM MISC; 1 Device by Does not apply route in the morning and at bedtime.  Dispense: 100 each; Refill: 3 - Ambulatory referral to Ophthalmology  3. Essential hypertension Blood pressure well-controlled - TSH - COMPLETE METABOLIC PANEL WITH GFR - CBC with Differential/Platelet  4. Tobacco abuse disorder Smoking cessation advised -Continue on Wellbutrin - buPROPion (WELLBUTRIN XL) 150 MG 24 hr tablet; Take 1 tablet (150 mg total) by mouth daily.  Dispense: 90 tablet; Refill: 1  5. Impacted cerumen of right ear Right ear cerumen lavaged with warm water and hydrogen peroxide moderate amounts of cerumen obtained.No instrument used.Tolerated procedure well.TM clear without any signs of infection.  6. Breast cancer screening by mammogram Asymptomatic  - MM DIGITAL SCREENING BILATERAL  Family/ staff Communication: Reviewed plan of care with patient and daughter verbalized understanding  Labs/tests ordered:  - Lipid panel - TSH - COMPLETE METABOLIC PANEL WITH GFR - CBC with  Differential/Platelet - Hemoglobin A1c  - MM DIGITAL SCREENING BILATERAL  Next Appointment: Return in about 6 months (around 01/17/2023) for medical mangement of chronic issues.Caesar Bookman, NP

## 2022-07-17 NOTE — Patient Instructions (Addendum)
Please call to schedule appointment for colonoscopy [facility]   Address: [address] [place], [place], [place] [address]   Phone: [phone]  2. Please call to schedule for Podiatrist   Referral was sent to [facility] they will contact patient with appointment.   [facility])   Address: [address] [place], [place], [place] [address]   Phone: [phone]

## 2022-07-17 NOTE — Telephone Encounter (Signed)
PA request received for OneTouch ultra 2 divce kit as well as test strips. PA initiated through covermymeds and waiting on reply.

## 2022-07-18 LAB — TSH: TSH: 0.45 mIU/L (ref 0.40–4.50)

## 2022-07-18 LAB — LIPID PANEL
Cholesterol: 222 mg/dL — ABNORMAL HIGH (ref <200)
HDL: 57 mg/dL (ref >=50)
LDL Cholesterol (Calc): 128 mg/dL (calc) — ABNORMAL HIGH
Non-HDL Cholesterol (Calc): 165 mg/dL (calc) — ABNORMAL HIGH (ref <130)
Total CHOL/HDL Ratio: 3.9 (calc) (ref <5.0)
Triglycerides: 220 mg/dL — ABNORMAL HIGH (ref <150)

## 2022-07-18 LAB — COMPLETE METABOLIC PANEL WITH GFR
AG Ratio: 1.4 (calc) (ref 1.0–2.5)
ALT: 14 U/L (ref 6–29)
AST: 11 U/L (ref 10–35)
Albumin: 4.3 g/dL (ref 3.6–5.1)
Alkaline phosphatase (APISO): 76 U/L (ref 37–153)
BUN: 19 mg/dL (ref 7–25)
CO2: 30 mmol/L (ref 20–32)
Calcium: 10.4 mg/dL (ref 8.6–10.4)
Chloride: 93 mmol/L — ABNORMAL LOW (ref 98–110)
Creat: 0.69 mg/dL (ref 0.60–1.00)
Globulin: 3 g/dL (calc) (ref 1.9–3.7)
Glucose, Bld: 413 mg/dL — ABNORMAL HIGH (ref 65–139)
Potassium: 4.9 mmol/L (ref 3.5–5.3)
Sodium: 131 mmol/L — ABNORMAL LOW (ref 135–146)
Total Bilirubin: 0.3 mg/dL (ref 0.2–1.2)
Total Protein: 7.3 g/dL (ref 6.1–8.1)
eGFR: 93 mL/min/{1.73_m2} (ref >=60)

## 2022-07-18 LAB — CBC WITH DIFFERENTIAL/PLATELET
Basophils Absolute: 66 cells/uL (ref 0–200)
Eosinophils Relative: 1 %
HCT: 44.6 % (ref 35.0–45.0)
MCV: 83.5 fL (ref 80.0–100.0)
Monocytes Relative: 8 %
Neutro Abs: 5969 cells/uL (ref 1500–7800)
Neutrophils Relative %: 63.5 %
WBC: 9.4 10*3/uL (ref 3.8–10.8)

## 2022-07-18 LAB — HEMOGLOBIN A1C
Hgb A1c MFr Bld: 13.7 % of total Hgb — ABNORMAL HIGH (ref <5.7)
Mean Plasma Glucose: 346 mg/dL
eAG (mmol/L): 19.2 mmol/L

## 2022-07-18 NOTE — Telephone Encounter (Signed)
One touch glucose test strips and meter approved. Approval letter is available under media tab.

## 2022-07-27 ENCOUNTER — Other Ambulatory Visit: Payer: Self-pay

## 2022-07-27 DIAGNOSIS — Z794 Long term (current) use of insulin: Secondary | ICD-10-CM

## 2022-07-27 DIAGNOSIS — E785 Hyperlipidemia, unspecified: Secondary | ICD-10-CM

## 2022-07-27 MED ORDER — ATORVASTATIN CALCIUM 20 MG PO TABS: 20.0000 mg | ORAL_TABLET | Freq: Every day | ORAL | 1 refills | Status: DC

## 2022-08-19 NOTE — Progress Notes (Signed)
Pharmacy Quality Measure Review  This patient is appearing on a report for being at risk of failing the adherence measure for cholesterol (statin) medications this calendar year.   Medication: atorvastatin 20 Last fill date: 07/27/22 for 90 day supply  Insurance report was not up to date. No action needed at this time.   Nils Pyle, PharmD PGY1 Pharmacy Resident

## 2022-08-27 ENCOUNTER — Other Ambulatory Visit: Payer: Medicare HMO

## 2022-10-09 ENCOUNTER — Other Ambulatory Visit: Payer: Self-pay | Admitting: Family

## 2022-10-09 DIAGNOSIS — E119 Type 2 diabetes mellitus without complications: Secondary | ICD-10-CM

## 2022-10-09 NOTE — Telephone Encounter (Signed)
Patient is requesting refill on medication. Medication shouldn't be due for refill until December 2024.

## 2022-10-21 ENCOUNTER — Other Ambulatory Visit: Payer: Self-pay | Admitting: Urgent Care

## 2022-10-21 ENCOUNTER — Ambulatory Visit (HOSPITAL_BASED_OUTPATIENT_CLINIC_OR_DEPARTMENT_OTHER)
Admission: RE | Admit: 2022-10-21 | Discharge: 2022-10-21 | Disposition: A | Discharge status: Home / Self Care | Payer: Medicare HMO | Source: Ambulatory Visit | Attending: Urgent Care | Admitting: Urgent Care

## 2022-10-21 ENCOUNTER — Ambulatory Visit
Admission: EM | Admit: 2022-10-21 | Discharge: 2022-10-21 | Disposition: A | Discharge status: Home / Self Care | Payer: Medicare HMO | Attending: Internal Medicine | Admitting: Internal Medicine

## 2022-10-21 DIAGNOSIS — S32591A Other specified fracture of right pubis, initial encounter for closed fracture: Secondary | ICD-10-CM | POA: Insufficient documentation

## 2022-10-21 DIAGNOSIS — M25551 Pain in right hip: Secondary | ICD-10-CM

## 2022-10-21 DIAGNOSIS — W19XXXA Unspecified fall, initial encounter: Secondary | ICD-10-CM | POA: Insufficient documentation

## 2022-10-21 DIAGNOSIS — S32511A Fracture of superior rim of right pubis, initial encounter for closed fracture: Secondary | ICD-10-CM | POA: Diagnosis not present

## 2022-10-21 DIAGNOSIS — S7001XA Contusion of right hip, initial encounter: Secondary | ICD-10-CM

## 2022-10-21 MED ORDER — OXYCODONE-ACETAMINOPHEN 5-325 MG PO TABS
1.0000 | ORAL_TABLET | Freq: Four times a day (QID) | ORAL | 0 refills | Status: DC | PRN
Start: 2022-10-21 — End: 2022-10-26

## 2022-10-21 MED ORDER — ACETAMINOPHEN 325 MG PO TABS: 650.0000 mg | ORAL_TABLET | Freq: Four times a day (QID) | ORAL | 0 refills | Status: DC | PRN

## 2022-10-21 NOTE — Discharge Instructions (Addendum)
Limit the use of the right leg. Use Tylenol for regular pain control, oxycodone for breakthrough pain. Call Dr. Nilsa Nutting office first thing tomorrow.

## 2022-10-21 NOTE — ED Provider Notes (Addendum)
[facility]RG[facility]  Note:  This document was prepared using Conservation officer, historic buildings and may include unintentional dictation errors.  MRN: [id number] DOB: 09/10/1950  Subjective:   Kathryn Mcguire is a 72 y.o. female presenting for 1 day history of persistent moderate to severe right hip pain.  Symptoms started from falling out of her bed yesterday while reaching for a water.  Per the patient and her family, no other area is in pain, believes that she absorbed most of the impact on her right hip.  No head injury, loss consciousness, confusion, changes to bowel or urinary habits.  She does have a history of dementia but is at her baseline per the family.  No current facility-administered medications for this encounter.  Current Outpatient Medications:    aspirin EC 81 MG tablet, Take 1 tablet (81 mg total) by mouth daily. Swallow whole., Disp: 90 tablet, Rfl: 0   atorvastatin (LIPITOR) 20 MG tablet, Take 1 tablet (20 mg total) by mouth daily., Disp: 90 tablet, Rfl: 1   Blood Glucose Monitoring Suppl (ONE TOUCH ULTRA 2) w/Device KIT, USE TO TEST BLOOD SUGAR ONCE DAILY, Disp: 1 kit, Rfl: 0   buPROPion (WELLBUTRIN XL) 150 MG 24 hr tablet, Take 1 tablet (150 mg total) by mouth daily., Disp: 90 tablet, Rfl: 1   carbamide peroxide (DEBROX) 6.5 % OTIC solution, Place 5 drops into the right ear 2 (two) times daily., Disp: 15 mL, Rfl: 0   diclofenac Sodium (VOLTAREN) 1 % GEL, Apply 2 g topically 4 (four) times daily., Disp: 100 g, Rfl: 0   Glucerna (GLUCERNA) LIQD, Take 237 mLs by mouth 2 (two) times daily between meals., Disp: 1500 mL, Rfl: 3   glucose blood (ONETOUCH ULTRA) test strip, USE TO TEST BLOOD SUGAR ONCE DAILY, Disp: 100 strip, Rfl: 1   insulin NPH-regular Human (70-30) 100 UNIT/ML injection, Inject 6 Units into the skin 2 (two) times daily with a meal., Disp: , Rfl:    Insulin Pen Needle 32G X 4 MM MISC, 1 Device by Does not apply route in the morning and at  bedtime., Disp: 100 each, Rfl: 3   Lancets (ONETOUCH ULTRASOFT) lancets, Use to test blood sugar once daily DX: E11.9, Disp: 100 each, Rfl: 1   metFORMIN (GLUCOPHAGE) 1000 MG tablet, TAKE 1 TABLET (1,000 MG TOTAL) BY MOUTH TWICE A DAY WITH FOOD, Disp: 180 tablet, Rfl: 1   No Known Allergies  Past Medical History:  Diagnosis Date   Arthritis    Bipolar depression (HCC)    COPD (chronic obstructive pulmonary disease) (HCC)    Dementia (HCC)    Diabetes mellitus without complication (HCC)    Elevated LFTs    Emphysema of lung (HCC)    GERD (gastroesophageal reflux disease)    History of adenomatous polyps of colon 10/15/2013   Hyperlipemia    Hypertension    UTI (lower urinary tract infection)      Past Surgical History:  Procedure Laterality Date   COLONOSCOPY     ESOPHAGOGASTRODUODENOSCOPY     TONSILLECTOMY     TONSILLECTOMY AND ADENOIDECTOMY     TUBAL LIGATION      Family History  Problem Relation Age of Onset   Diabetes Mother    Lung cancer Sister    Diabetes Daughter    Lupus Daughter    Migraines Daughter        chronic   Bursitis Daughter    Colon cancer Neg Hx  Stomach cancer Neg Hx    Esophageal cancer Neg Hx    Rectal cancer Neg Hx     Social History   Tobacco Use   Smoking status: Every Day    Types: Cigarettes   Smokeless tobacco: Current    Types: Chew   Tobacco comments:    Smokes maybe one cigarette a week. But chews everyday.  Vaping Use   Vaping status: Never Used  Substance Use Topics   Alcohol use: Not Currently    Comment:     Drug use: No    ROS   Objective:   Vitals: BP 106/72 (BP Location: Right Arm)   Pulse (!) 108   Temp 98.9 F (37.2 C) (Oral)   Resp (!) 24   SpO2 94%   Physical Exam Constitutional:      General: She is not in acute distress.    Appearance: Normal appearance. She is well-developed. She is not ill-appearing, toxic-appearing or diaphoretic.  HENT:     Head: Normocephalic and atraumatic.     Nose:  Nose normal.     Mouth/Throat:     Mouth: Mucous membranes are moist.  Eyes:     General: No scleral icterus.       Right eye: No discharge.        Left eye: No discharge.     Extraocular Movements: Extraocular movements intact.  Cardiovascular:     Rate and Rhythm: Normal rate.  Pulmonary:     Effort: Pulmonary effort is normal.  Musculoskeletal:     Right hip: Tenderness (about the area outlined) and bony tenderness present. No deformity, lacerations or crepitus. Decreased range of motion.     Right upper leg: No swelling, edema, deformity, lacerations, tenderness or bony tenderness.       Legs:     Comments: Patient ambulating in wheelchair she is having difficulty bearing weight of the right leg.  Skin:    General: Skin is warm and dry.  Neurological:     General: No focal deficit present.     Mental Status: She is alert and oriented to person, place, and time.  Psychiatric:        Mood and Affect: Mood normal.        Behavior: Behavior normal.     Assessment and Plan :   PDMP not reviewed this encounter.  1. Right hip pain   2. Contusion of right hip, initial encounter    Working diagnosis is contusion of the right hip.  I am pursuing outpatient imaging.  Will finalize an update diagnoses and treatment plan following the radiology over read.  For now recommended using Tylenol for pain relief.  Counseled patient on potential for adverse effects with medications prescribed/recommended today, ER and return-to-clinic precautions discussed, patient verbalized understanding.    Wallis Bamberg, [place] 10/21/22 1610  UPDATE: DG Hip Unilat With Pelvis 2-3 Views Right  Result Date: 10/21/2022 CLINICAL DATA:  Right hip pain.  Status post fall. EXAM: DG HIP (WITH OR WITHOUT PELVIS) 2-3V RIGHT COMPARISON:  None Available. FINDINGS: There are nondisplaced fractures involving the right superior and inferior pubic rami. The right hip is located and intact. No signs of hip fracture.  IMPRESSION: Nondisplaced fractures of the right superior and inferior pubic rami. Electronically Signed   By: Signa Kell M.D.   On: 10/21/2022 10:31    1. Closed fracture of ramus of right pubis, initial encounter (HCC)   2. Right hip pain   3. Contusion of  right hip, initial encounter    Discussed results with her family member who presented to the clinic, [name].  Patient has nondisplaced fracture of the right superior and inferior pubic rami. Clinically this is not unstable.  Patient appeared quite comfortable in a seated position in the wheelchair.  Vital signs were hemodynamically stable.  Strength was preserved for the right thigh, right lower leg.  Will recommend continued use of Tylenol, oxycodone for breakthrough pain.  Follow-up with Dr. Charlann Boxer at [facility] asap.    Wallis Bamberg, PA-C 10/21/22 1100

## 2022-10-21 NOTE — ED Triage Notes (Addendum)
Per pt and family pt fell from her bed yesterday while reaching for water-c/o pain to right hip-last dose ibuprofen 0645-to tx room in w/c-NAD

## 2022-10-22 ENCOUNTER — Telehealth: Payer: Self-pay

## 2022-10-22 DIAGNOSIS — R2681 Unsteadiness on feet: Secondary | ICD-10-CM

## 2022-10-22 DIAGNOSIS — S32591S Other specified fracture of right pubis, sequela: Secondary | ICD-10-CM

## 2022-10-22 NOTE — Telephone Encounter (Signed)
Please schedule appointment here for follow up then I can evaluate her pain.

## 2022-10-22 NOTE — Telephone Encounter (Signed)
So sorry to hear about her fall on her birthday.will order wheelchair to be faxed to DME company or picked up by POA if desired.

## 2022-10-22 NOTE — Telephone Encounter (Signed)
Patient daughter call this afternoon and left a voicemail in regards of her mother because she fall on her birthday on last Saturday and was taking the [facility] and they examine her with x-ray that show a fracture pelvis in two places.  Patient's family is asking for an order for a wheelchair because she is not walking at all and that she has an upcoming follow -up appointment with the [facility] with Dr. Charlann Boxer.  Message sent to [name], [name], NP

## 2022-10-22 NOTE — Telephone Encounter (Signed)
Spoke with daughter to give an update to let her know that the order has been faxed. Daughter also states that she called [facility] with Dr. Charlann Boxer and that they say that they will not be able to see her until two to three weeks.  Daughter is concern and wanted to ask if you will put in a referral for Orthopedics because they only give her 15 tablets for pain.  Please advise  Message sent [name], [name], NP

## 2022-10-23 NOTE — Telephone Encounter (Signed)
Spoke with [name] and scheduled appointment for this Friday with [name] @ 1:20 pm. [name] declined sooner visit today with Dr.Veludandi. [name] also asked where was order faxed. I placed [name] on a brief hold while I consulted with [name] and concluded that order was faxed to [employer] @ [phone], phone number for [employer] is [phone].

## 2022-10-26 ENCOUNTER — Encounter: Payer: Self-pay | Admitting: Family

## 2022-10-26 ENCOUNTER — Ambulatory Visit (INDEPENDENT_AMBULATORY_CARE_PROVIDER_SITE_OTHER): Payer: Medicare HMO | Admitting: Family

## 2022-10-26 VITALS — BP 100/70 | HR 98 | Temp 96.8°F | Resp 16 | Ht 62.0 in | Wt 116.0 lb

## 2022-10-26 DIAGNOSIS — M25551 Pain in right hip: Secondary | ICD-10-CM | POA: Diagnosis not present

## 2022-10-26 DIAGNOSIS — S32591S Other specified fracture of right pubis, sequela: Secondary | ICD-10-CM | POA: Diagnosis not present

## 2022-10-26 DIAGNOSIS — R2681 Unsteadiness on feet: Secondary | ICD-10-CM

## 2022-10-26 MED ORDER — OXYCODONE-ACETAMINOPHEN 5-325 MG PO TABS: 1.0000 | ORAL_TABLET | Freq: Four times a day (QID) | ORAL | 0 refills | Status: DC | PRN

## 2022-10-26 NOTE — Progress Notes (Signed)
Provider: Richarda Blade FNP-C  Vyolet Sakuma, [name], NP  Patient Care Team: Karri Kallenbach, [name], NP as PCP - General (Family Medicine) Van Clines, MD as Consulting Physician (Neurology)  Extended Emergency Contact Information Primary Emergency Contact: [facility] Phone: [phone] Relation: Daughter Interpreter needed? No Secondary Emergency Contact: Sponsel,[name] Mobile Phone: [phone] Relation: Daughter  Code Status:  DNR Goals of care: Advanced Directive information    07/17/2022   12:44 PM  Advanced Directives  Does Patient Have a Medical Advance Directive? No  Would patient like information on creating a medical advance directive? No - Patient declined     Chief Complaint  Patient presents with   Acute Visit    She feel over weekend, fractured pelvis in two places.     HPI:  Pt is a 72 y.o. female seen today for an acute visit for left hip/pelvis pain.she is here with two of her daughters who provides additional HPI information.states patient fell in her bedroom trying to pickup something from off the floor.she was evaluated in the urgent care. Right hip X-ray Nondisplaced fractures of the right superior and inferior pubic rami.she was prescribed percocet 5-325 mg tablet one by mouth every 6 Hrs PRN. Has appointment with orthopedic on 11/05/2022  Request refill for pain medication.  She denies any numbness ,tingling or weakness on the left leg.   She will require DME light weight wheelchair to allow her to maintain current level of independence with ADL's which cannot be achieved with Rolling walker or cane. Patient suffers from unsteady gait due to recent  Nondisplaced fractures of the right superior and inferior pubic which impairs her ability to perform daily activities like bathing,walking,dressing,grooming and toileting in the home. Has mental and physical ability to operate power wheelchair and willing to use in her home.Family members also  available to assist.   Past Medical History:  Diagnosis Date   Arthritis    Bipolar depression (HCC)    COPD (chronic obstructive pulmonary disease) (HCC)    Dementia (HCC)    Diabetes mellitus without complication (HCC)    Elevated LFTs    Emphysema of lung (HCC)    GERD (gastroesophageal reflux disease)    History of adenomatous polyps of colon 10/15/2013   Hyperlipemia    Hypertension    UTI (lower urinary tract infection)    Past Surgical History:  Procedure Laterality Date   COLONOSCOPY     ESOPHAGOGASTRODUODENOSCOPY     TONSILLECTOMY     TONSILLECTOMY AND ADENOIDECTOMY     TUBAL LIGATION      No Known Allergies  Outpatient Encounter Medications as of 10/26/2022  Medication Sig   acetaminophen (TYLENOL) 325 MG tablet Take 2 tablets (650 mg total) by mouth every 6 (six) hours as needed for moderate pain.   aspirin EC 81 MG tablet Take 1 tablet (81 mg total) by mouth daily. Swallow whole.   atorvastatin (LIPITOR) 20 MG tablet Take 1 tablet (20 mg total) by mouth daily.   Blood Glucose Monitoring Suppl (ONE TOUCH ULTRA 2) w/Device KIT USE TO TEST BLOOD SUGAR ONCE DAILY   buPROPion (WELLBUTRIN XL) 150 MG 24 hr tablet Take 1 tablet (150 mg total) by mouth daily.   carbamide peroxide (DEBROX) 6.5 % OTIC solution Place 5 drops into the right ear 2 (two) times daily.   diclofenac Sodium (VOLTAREN) 1 % GEL Apply 2 g topically 4 (four) times daily.   Glucerna (GLUCERNA) LIQD Take 237 mLs by mouth 2 (two) times daily  between meals.   glucose blood (ONETOUCH ULTRA) test strip USE TO TEST BLOOD SUGAR ONCE DAILY   insulin NPH-regular Human (70-30) 100 UNIT/ML injection Inject 6 Units into the skin 2 (two) times daily with a meal.   Insulin Pen Needle 32G X 4 MM MISC 1 Device by Does not apply route in the morning and at bedtime.   Lancets (ONETOUCH ULTRASOFT) lancets Use to test blood sugar once daily DX: E11.9   metFORMIN (GLUCOPHAGE) 1000 MG tablet TAKE 1 TABLET (1,000 MG TOTAL) BY  MOUTH TWICE A DAY WITH FOOD   oxyCODONE-acetaminophen (PERCOCET/ROXICET) 5-325 MG tablet Take 1 tablet by mouth every 6 (six) hours as needed for severe pain.   No facility-administered encounter medications on file as of 10/26/2022.    Review of Systems  Constitutional:  Negative for appetite change, chills, fatigue, fever and unexpected weight change.  Respiratory:  Negative for cough, chest tightness, shortness of breath and wheezing.   Cardiovascular:  Negative for chest pain, palpitations and leg swelling.  Gastrointestinal:  Negative for abdominal distention, abdominal pain, blood in stool, constipation, diarrhea, nausea and vomiting.  Genitourinary:  Negative for difficulty urinating, dysuria, flank pain, frequency and urgency.  Musculoskeletal:  Positive for arthralgias and gait problem. Negative for back pain, joint swelling, myalgias, neck pain and neck stiffness.  Skin:  Negative for color change, pallor and rash.  Neurological:  Negative for dizziness, syncope, speech difficulty, weakness, light-headedness, numbness and headaches.    Immunization History  Administered Date(s) Administered   Fluad Quad(high Dose 65+) 01/13/2019   Influenza,inj,Quad PF,6+ Mos 09/28/2016   Influenza-Unspecified 09/08/2013   PFIZER(Purple Top)SARS-COV-2 Vaccination 08/27/2019   PNEUMOCOCCAL CONJUGATE-20 05/16/2021   PPD Test 06/16/2013   Pneumococcal Conjugate-13 01/13/2019   Pertinent  Health Maintenance Due  Topic Date Due   OPHTHALMOLOGY EXAM  Never done   MAMMOGRAM  10/09/2015   Colonoscopy  10/15/2016   INFLUENZA VACCINE  08/09/2022   HEMOGLOBIN A1C  01/17/2023   FOOT EXAM  07/17/2023   DEXA SCAN  Completed      01/13/2019   11:14 AM 05/16/2021    2:53 PM 11/14/2021    2:32 PM 07/17/2022   12:52 PM 10/26/2022    1:37 PM  Fall Risk  Falls in the past year? 1 0 0 0 1  Was there an injury with Fall? 1 0 0 0 1  Fall Risk Category Calculator 2 0 0 0 2  Fall Risk Category (Retired)  Moderate Low Low    (RETIRED) Patient Fall Risk Level Moderate fall risk Low fall risk Low fall risk    Patient at Risk for Falls Due to  No Fall Risks No Fall Risks No Fall Risks   Fall risk Follow up  Falls evaluation completed Falls evaluation completed Falls evaluation completed    Functional Status Survey:    Vitals:   10/26/22 1339  BP: 100/70  Pulse: 98  Resp: 16  Temp: (!) 96.8 F (36 C)  SpO2: 98%  Weight: 116 lb (52.6 kg)  Height: 5\' 2"  (1.575 m)   Body mass index is 21.22 kg/m. Physical Exam Vitals reviewed.  Constitutional:      General: She is not in acute distress.    Appearance: Normal appearance. She is normal weight. She is not ill-appearing or diaphoretic.  HENT:     Head: Normocephalic.     Mouth/Throat:     Mouth: Mucous membranes are moist.     Pharynx: Oropharynx is clear. No oropharyngeal exudate  or posterior oropharyngeal erythema.  Eyes:     General: No scleral icterus.       Right eye: No discharge.        Left eye: No discharge.     Extraocular Movements: Extraocular movements intact.     Conjunctiva/sclera: Conjunctivae normal.     Pupils: Pupils are equal, round, and reactive to light.  Neck:     Vascular: No carotid bruit.  Cardiovascular:     Rate and Rhythm: Normal rate and regular rhythm.     Pulses: Normal pulses.     Heart sounds: Normal heart sounds. No murmur heard.    No friction rub. No gallop.  Pulmonary:     Effort: Pulmonary effort is normal. No respiratory distress.     Breath sounds: Normal breath sounds. No wheezing, rhonchi or rales.  Chest:     Chest wall: No tenderness.  Abdominal:     General: Bowel sounds are normal. There is no distension.     Palpations: Abdomen is soft. There is no mass.     Tenderness: There is no abdominal tenderness. There is no right CVA tenderness, left CVA tenderness, guarding or rebound.  Musculoskeletal:        General: No swelling.     Cervical back: Normal range of motion. No  rigidity or tenderness.     Right hip: Tenderness present. Decreased range of motion. Normal strength.     Left hip: Normal.     Right lower leg: No edema.     Left lower leg: No edema.     Comments: Limited right hip exam due to pain with weight bearing   Lymphadenopathy:     Cervical: No cervical adenopathy.  Skin:    General: Skin is warm and dry.     Coloration: Skin is not pale.     Findings: No bruising, erythema, lesion or rash.  Neurological:     Mental Status: She is alert and oriented to person, place, and time.     Cranial Nerves: No cranial nerve deficit.     Sensory: No sensory deficit.     Motor: No weakness.     Coordination: Coordination normal.     Gait: Gait abnormal.  Psychiatric:        Mood and Affect: Mood normal.        Speech: Speech normal.        Behavior: Behavior normal.     Labs reviewed: Recent Labs    07/17/22 1354  NA 131*  K 4.9  CL 93*  CO2 30  GLUCOSE 413*  BUN 19  CREATININE 0.69  CALCIUM 10.4   Recent Labs    07/17/22 1354  AST 11  ALT 14  BILITOT 0.3  PROT 7.3   Recent Labs    07/17/22 1354  WBC 9.4  NEUTROABS 5,969  HGB 14.1  HCT 44.6  MCV 83.5  PLT 255   Lab Results  Component Value Date   TSH 0.45 07/17/2022   Lab Results  Component Value Date   HGBA1C 13.7 (H) 07/17/2022   Lab Results  Component Value Date   CHOL 222 (H) 07/17/2022   HDL 57 07/17/2022   LDLCALC 128 (H) 07/17/2022   TRIG 220 (H) 07/17/2022   CHOLHDL 3.9 07/17/2022    Significant Diagnostic Results in last 30 days:  DG Hip Unilat With Pelvis 2-3 Views Right  Result Date: 10/21/2022 CLINICAL DATA:  Right hip pain.  Status post fall. EXAM: DG HIP (  WITH OR WITHOUT PELVIS) 2-3V RIGHT COMPARISON:  None Available. FINDINGS: There are nondisplaced fractures involving the right superior and inferior pubic rami. The right hip is located and intact. No signs of hip fracture. IMPRESSION: Nondisplaced fractures of the right superior and inferior  pubic rami. Electronically Signed   By: Signa Kell M.D.   On: 10/21/2022 10:31    Assessment/Plan 1. Unsteady gait Status post fall  - DME Wheelchair manual  2. Closed fracture of ramus of right pubis, sequela She require DME light weight wheelchair to allow her to maintain current level of independence with ADL's which cannot be achieved with Rolling walker or cane. Patient suffers from unsteady gait due to recent  Nondisplaced fractures of the right superior and inferior pubic which impairs her ability to perform daily activities like bathing,walking,dressing,grooming and toileting in the home. Has mental and physical ability to operate power wheelchair and willing to use in her home.Family members also available to assist. - DME Wheelchair manual - oxyCODONE-acetaminophen (PERCOCET/ROXICET) 5-325 MG tablet; Take 1 tablet by mouth every 6 (six) hours as needed for severe pain (pain score 7-10).  Dispense: 60 tablet; Refill: 0 - continue to follow up with Orthopedic   3. Right hip pain Will refill oxycodone- APAP as below Side effects discussed  - oxyCODONE-acetaminophen (PERCOCET/ROXICET) 5-325 MG tablet; Take 1 tablet by mouth every 6 (six) hours as needed for severe pain (pain score 7-10).  Dispense: 60 tablet; Refill: 0  Family/ staff Communication: Reviewed plan of care with patient and her daughters   Labs/tests ordered: None   Next Appointment: Return if symptoms worsen or fail to improve.   Caesar Bookman, NP

## 2022-11-05 DIAGNOSIS — S3282XD Multiple fractures of pelvis without disruption of pelvic ring, subsequent encounter for fracture with routine healing: Secondary | ICD-10-CM | POA: Diagnosis not present

## 2022-11-16 NOTE — Addendum Note (Signed)
Addended by: Verlan Friends on: 11/16/2022 01:24 PM   Modules accepted: Orders

## 2022-11-21 ENCOUNTER — Other Ambulatory Visit: Payer: Self-pay

## 2022-11-21 DIAGNOSIS — E785 Hyperlipidemia, unspecified: Secondary | ICD-10-CM

## 2022-11-21 DIAGNOSIS — Z794 Long term (current) use of insulin: Secondary | ICD-10-CM

## 2022-11-27 ENCOUNTER — Other Ambulatory Visit: Payer: Medicare HMO

## 2022-12-11 ENCOUNTER — Ambulatory Visit: Payer: Medicare HMO | Admitting: Family

## 2022-12-11 ENCOUNTER — Other Ambulatory Visit: Payer: Medicare HMO

## 2022-12-11 ENCOUNTER — Other Ambulatory Visit: Payer: Self-pay | Admitting: Family

## 2022-12-11 DIAGNOSIS — E119 Type 2 diabetes mellitus without complications: Secondary | ICD-10-CM

## 2022-12-18 ENCOUNTER — Encounter (INDEPENDENT_AMBULATORY_CARE_PROVIDER_SITE_OTHER): Payer: Medicare HMO | Admitting: Family

## 2022-12-18 ENCOUNTER — Other Ambulatory Visit: Payer: Medicare HMO

## 2022-12-24 NOTE — Progress Notes (Signed)
  This encounter was created in error - please disregard. No show 

## 2023-01-14 ENCOUNTER — Ambulatory Visit: Payer: Medicare HMO | Admitting: Family

## 2023-01-16 ENCOUNTER — Ambulatory Visit: Payer: Medicare HMO | Admitting: Family

## 2023-01-17 ENCOUNTER — Ambulatory Visit: Payer: Medicare HMO | Admitting: Family

## 2023-02-02 ENCOUNTER — Other Ambulatory Visit: Payer: Self-pay | Admitting: Family

## 2023-02-06 ENCOUNTER — Encounter: Payer: Medicare HMO | Admitting: Family

## 2023-02-06 NOTE — Progress Notes (Signed)
This encounter was created in error - please disregard. No show

## 2023-02-26 ENCOUNTER — Encounter: Payer: Medicare HMO | Admitting: Family

## 2023-02-26 NOTE — Progress Notes (Signed)
   This encounter was created in error - please disregard. No show

## 2023-05-01 ENCOUNTER — Ambulatory Visit: Payer: Self-pay

## 2023-05-01 NOTE — Telephone Encounter (Signed)
 Noted.

## 2023-05-01 NOTE — Telephone Encounter (Signed)
  Copied From CRM [id number]. Reason for Triage: daughter calling stated mother is having symptoms of frequent unination and  a foul vaginal odor     Chief Complaint: urinary frequency/smell  Disposition: [] ED /[] Urgent Care (no appt availability in office) / [x] Appointment(In office/virtual)/ []  [facility]/ [] Home Care/ [] Refused Recommended Disposition /[] [facility]/ []  Follow-up with PCP Additional Notes: Daughter Mariah Shines called with concerns of increase in urinary frequency and foul smelling urine. Mariah Shines stated, "this has been going on for a while but I thought these symptoms were related to her diabetes and because she drink so much." Mariah Shines  shared the pt doesn't complain of pain. Denies blood in urine.  Pt has appt tomorrow at 1320 with PCP. RN gave care advice and pt verbalized understanding.            Reason for Disposition  Urinating more frequently than usual (i.e., frequency)  Answer Assessment - Initial Assessment Questions 1. SYMPTOM: "What's the main symptom you're concerned about?" (e.g., frequency, incontinence)     Foul smell urine, increased in frequency  2. ONSET: "When did this  start?"     "Awhile" 3. PAIN: "Is there any pain?" If Yes, ask: "How bad is it?" (Scale: 1-10; mild, moderate, severe)     denies 4. CAUSE: "What do you think is causing the symptoms?"     Not sure 5. OTHER SYMPTOMS: "Do you have any other symptoms?" (e.g., blood in urine, fever, flank pain, pain with urination)     Denies all 6  Protocols used: Urinary Symptoms-A-AH

## 2023-05-02 ENCOUNTER — Ambulatory Visit: Admitting: Family

## 2023-05-07 ENCOUNTER — Other Ambulatory Visit: Payer: Self-pay | Admitting: Family

## 2023-05-07 ENCOUNTER — Ambulatory Visit: Admitting: Family

## 2023-05-07 ENCOUNTER — Encounter: Payer: Self-pay | Admitting: Family

## 2023-05-07 VITALS — BP 112/70 | HR 92 | Temp 97.5°F | Ht 62.0 in | Wt 114.4 lb

## 2023-05-07 DIAGNOSIS — I10 Essential (primary) hypertension: Secondary | ICD-10-CM

## 2023-05-07 DIAGNOSIS — L84 Corns and callosities: Secondary | ICD-10-CM

## 2023-05-07 DIAGNOSIS — R350 Frequency of micturition: Secondary | ICD-10-CM | POA: Diagnosis not present

## 2023-05-07 DIAGNOSIS — E119 Type 2 diabetes mellitus without complications: Secondary | ICD-10-CM | POA: Diagnosis not present

## 2023-05-07 DIAGNOSIS — E785 Hyperlipidemia, unspecified: Secondary | ICD-10-CM | POA: Diagnosis not present

## 2023-05-07 DIAGNOSIS — Z794 Long term (current) use of insulin: Secondary | ICD-10-CM

## 2023-05-07 DIAGNOSIS — F0390 Unspecified dementia without behavioral disturbance: Secondary | ICD-10-CM

## 2023-05-07 LAB — POCT URINALYSIS DIPSTICK (MANUAL)
Nitrite, UA: POSITIVE — AB
Poct Blood: 50 — AB
Poct Glucose: 1000 mg/dL — AB
Poct Ketones: NEGATIVE
Poct Protein: NEGATIVE mg/dL
Poct Urobilinogen: 1 mg/dL — AB
Spec Grav, UA: 1.02 (ref 1.010–1.025)
pH, UA: 7 (ref 5.0–8.0)

## 2023-05-07 MED ORDER — CIPROFLOXACIN HCL 500 MG PO TABS
500.0000 mg | ORAL_TABLET | Freq: Two times a day (BID) | ORAL | 0 refills | Status: AC
Start: 2023-05-07 — End: 2023-05-14

## 2023-05-07 MED ORDER — DONEPEZIL HCL 5 MG PO TABS
ORAL_TABLET | ORAL | 1 refills | Status: DC
Start: 2023-05-07 — End: 2023-05-07

## 2023-05-07 NOTE — Telephone Encounter (Signed)
 Pharmacy sent message: Pharmacy comment: Alternative Requested:INSURANCE WILL NOT PAY FOR MORE THAN 1 PER DAY.   Pharmacy needs new Rx sent.

## 2023-05-09 NOTE — Progress Notes (Signed)
 Provider: Christean Courts FNP-C  Kathryn Mcguire, Elijio Guadeloupe, NP  Patient Care Team: [name], Elijio Guadeloupe, NP as PCP - General (Family Medicine) Jhonny Moss, MD as Consulting Physician (Neurology)  Extended Emergency Contact Information Primary Emergency Contact: [facility] Phone: [phone] Relation: Daughter Interpreter needed? No Secondary Emergency Contact: Bachmeier,[name] Mobile Phone: [phone] Relation: Daughter  Code Status: Full Code  Goals of care: Advanced Directive information    05/07/2023    2:23 PM  Advanced Directives  Does Patient Have a Medical Advance Directive? No  Would patient like information on creating a medical advance directive? No - Patient declined     Chief Complaint  Patient presents with   Urinary Tract Infection    Uti, urine freq, has strong order, no pain or chills that started about 2 months ago ,pt stated she hasn't had any fever or burning with urination  her daughter wants to talk about medication for her memory      Discussed the use of AI scribe software for clinical note transcription with the patient, who gave verbal consent to proceed.  History of Present Illness   Kathryn Mcguire is a 73 year old female with diabetes,Hypertension, hyperlipidemia, dementia without behavioral disturbances who presents with symptoms of a urinary tract infection.  She has been experiencing frequent urination with a foul odor and some pain. No itching, fever, or chills are present. These symptoms have persisted for a while, but she was unable to seek medical attention earlier due to car trouble.  She has a history of diabetes and is currently on metformin  and insulin  (70/30, 26 units twice a day with meals). Her blood sugar levels have been high, sometimes reaching 300 mg/dL. She missed a fasting lab appointment due to transportation issues. Her last A1c, nine months ago, was 13.7%.  She is not currently taking Voltaren  gel, aspirin , or  Wellbutrin . She continues to take atorvastatin  20 mg for cholesterol and uses Tylenol  as needed for pain.  She has a history of dementia and has not experienced any worsening of symptoms. She used to wander to the store but has not done so recently. She manages some daily activities independently, such as cleaning and cooking.  She has not had any falls since breaking her pelvis and denies any current pain or tightness. She has not seen an eye doctor recently and has a callus on her left foot, but no swelling or numbness in her legs.  She is involved in household activities such as cooking and cleaning. She sometimes receives help from her middle sister with cooking.  No fever, chills, itching, or recent falls. She reports a cough and has not experienced any recent depression.   Past Medical History:  Diagnosis Date   Arthritis    Bipolar depression (HCC)    COPD (chronic obstructive pulmonary disease) (HCC)    Dementia (HCC)    Diabetes mellitus without complication (HCC)    Elevated LFTs    Emphysema of lung (HCC)    GERD (gastroesophageal reflux disease)    History of adenomatous polyps of colon 10/15/2013   Hyperlipemia    Hypertension    UTI (lower urinary tract infection)    Past Surgical History:  Procedure Laterality Date   COLONOSCOPY     ESOPHAGOGASTRODUODENOSCOPY     TONSILLECTOMY     TONSILLECTOMY AND ADENOIDECTOMY     TUBAL LIGATION      No Known Allergies  Outpatient Encounter Medications as of 05/07/2023  Medication Sig  acetaminophen  (TYLENOL ) 325 MG tablet Take 2 tablets (650 mg total) by mouth every 6 (six) hours as needed for moderate pain.   atorvastatin  (LIPITOR) 20 MG tablet TAKE 1 TABLET BY MOUTH EVERY DAY   Blood Glucose Monitoring Suppl (ONE TOUCH ULTRA 2) w/Device KIT USE TO TEST BLOOD SUGAR ONCE DAILY   ciprofloxacin  (CIPRO ) 500 MG tablet Take 1 tablet (500 mg total) by mouth 2 (two) times daily for 7 days.   Glucerna (GLUCERNA) LIQD Take 237 mLs by  mouth 2 (two) times daily between meals.   glucose blood (ONETOUCH ULTRA) test strip USE TO TEST BLOOD SUGAR ONCE DAILY   insulin  NPH-regular Human (70-30) 100 UNIT/ML injection Inject 6 Units into the skin 2 (two) times daily with a meal.   Insulin  Pen Needle 32G X 4 MM MISC 1 Device by Does not apply route in the morning and at bedtime.   Lancets (ONETOUCH ULTRASOFT) lancets Use to test blood sugar once daily DX: E11.9   metFORMIN  (GLUCOPHAGE ) 1000 MG tablet TAKE 1 TABLET (1,000 MG TOTAL) BY MOUTH TWICE A DAY WITH FOOD   [DISCONTINUED] donepezil  (ARICEPT ) 5 MG tablet Take one tablet mouth daily x 1 month then increase to 2 tablet daily.   [DISCONTINUED] oxyCODONE -acetaminophen  (PERCOCET/ROXICET) 5-325 MG tablet Take 1 tablet by mouth every 6 (six) hours as needed for severe pain (pain score 7-10).   aspirin  EC 81 MG tablet Take 1 tablet (81 mg total) by mouth daily. Swallow whole. (Patient not taking: Reported on 05/07/2023)   [DISCONTINUED] buPROPion  (WELLBUTRIN  XL) 150 MG 24 hr tablet Take 1 tablet (150 mg total) by mouth daily. (Patient not taking: Reported on 05/07/2023)   [DISCONTINUED] carbamide peroxide (DEBROX) 6.5 % OTIC solution Place 5 drops into the right ear 2 (two) times daily.   [DISCONTINUED] diclofenac  Sodium (VOLTAREN ) 1 % GEL Apply 2 g topically 4 (four) times daily. (Patient not taking: Reported on 05/07/2023)   No facility-administered encounter medications on file as of 05/07/2023.    Review of Systems  Constitutional:  Negative for appetite change, chills, fatigue, fever and unexpected weight change.  HENT:  Negative for congestion, dental problem, ear discharge, ear pain, facial swelling, hearing loss, nosebleeds, postnasal drip, rhinorrhea, sinus pressure, sinus pain, sneezing, sore throat, tinnitus and trouble swallowing.   Eyes:  Negative for pain, discharge, redness, itching and visual disturbance.  Respiratory:  Negative for cough, chest tightness, shortness of breath  and wheezing.   Cardiovascular:  Negative for chest pain, palpitations and leg swelling.  Gastrointestinal:  Negative for abdominal distention, abdominal pain, blood in stool, constipation, diarrhea, nausea and vomiting.  Endocrine: Negative for cold intolerance, heat intolerance, polydipsia, polyphagia and polyuria.  Genitourinary:  Positive for frequency. Negative for difficulty urinating, dysuria, flank pain and urgency.       Urine odor   Musculoskeletal:  Positive for gait problem. Negative for arthralgias, back pain, joint swelling, myalgias, neck pain and neck stiffness.  Skin:  Negative for color change, pallor, rash and wound.  Neurological:  Negative for dizziness, syncope, speech difficulty, weakness, light-headedness, numbness and headaches.  Hematological:  Does not bruise/bleed easily.  Psychiatric/Behavioral:  Negative for agitation, behavioral problems, confusion, hallucinations, self-injury, sleep disturbance and suicidal ideas. The patient is not nervous/anxious.        Memory loss     Immunization History  Administered Date(s) Administered   Fluad Quad(high Dose 65+) 01/13/2019   Influenza,inj,Quad PF,6+ Mos 09/28/2016   Influenza-Unspecified 09/08/2013   PFIZER(Purple Top)SARS-COV-2 Vaccination 08/27/2019  PNEUMOCOCCAL CONJUGATE-20 05/16/2021   PPD Test 06/16/2013   Pneumococcal Conjugate-13 01/13/2019   Pertinent  Health Maintenance Due  Topic Date Due   OPHTHALMOLOGY EXAM  Never done   MAMMOGRAM  10/09/2015   Colonoscopy  10/15/2016   HEMOGLOBIN A1C  01/17/2023   FOOT EXAM  07/17/2023   INFLUENZA VACCINE  08/09/2023   DEXA SCAN  Completed      05/16/2021    2:53 PM 11/14/2021    2:32 PM 07/17/2022   12:52 PM 10/26/2022    1:37 PM 05/07/2023    2:07 PM  Fall Risk  Falls in the past year? 0 0 0 1 0  Was there an injury with Fall? 0 0 0 1 0  Fall Risk Category Calculator 0 0 0 2 0  Fall Risk Category (Retired) Low Low     (RETIRED) Patient Fall Risk Level  Low fall risk Low fall risk     Patient at Risk for Falls Due to No Fall Risks No Fall Risks No Fall Risks  No Fall Risks  Fall risk Follow up Falls evaluation completed Falls evaluation completed Falls evaluation completed  Falls prevention discussed;Falls evaluation completed   Functional Status Survey:    Vitals:   05/07/23 1404  BP: 112/70  Pulse: 92  Temp: (!) 97.5 F (36.4 C)  TempSrc: Temporal  SpO2: 99%  Weight: 114 lb 6.4 oz (51.9 kg)  Height: 5\' 2"  (1.575 m)   Body mass index is 20.92 kg/m. Physical Exam Physical Exam   VITALS: T- 97.5, P- 92, BP- 112/70, SaO2- 99% MEASUREMENTS: Weight- 114. GENERAL: Alert, cooperative, well developed, no acute distress HEENT: Normocephalic, normal oropharynx, moist mucous membranes, nose normal CHEST: Clear to auscultation bilaterally, no wheezes, rhonchi, or crackles CARDIOVASCULAR: Normal heart rate and rhythm, S1 and S2 normal without murmurs ABDOMEN: Soft, non-tender, non-distended, without organomegaly, normal bowel sounds EXTREMITIES: No cyanosis or edema, sensation intact, callus on left plantar foot MUSCULOSKELETAL: Normal range of motion in hips and knees NEUROLOGICAL: Cranial nerves grossly intact, moves all extremities without gross motor or sensory deficit  SKIN: No rash,no lesion or erythema   PSYCHIATRY/BEHAVIORAL: Mood stable memory loss    Labs reviewed: Recent Labs    07/17/22 1354  NA 131*  K 4.9  CL 93*  CO2 30  GLUCOSE 413*  BUN 19  CREATININE 0.69  CALCIUM  10.4   Recent Labs    07/17/22 1354  AST 11  ALT 14  BILITOT 0.3  PROT 7.3   Recent Labs    07/17/22 1354  WBC 9.4  NEUTROABS 5,969  HGB 14.1  HCT 44.6  MCV 83.5  PLT 255   Lab Results  Component Value Date   TSH 0.45 07/17/2022   Lab Results  Component Value Date   HGBA1C 13.7 (H) 07/17/2022   Lab Results  Component Value Date   CHOL 222 (H) 07/17/2022   HDL 57 07/17/2022   LDLCALC 128 (H) 07/17/2022   TRIG 220 (H)  07/17/2022   CHOLHDL 3.9 07/17/2022    Significant Diagnostic Results in last 30 days:  No results found.  Assessment/Plan  Type 2 diabetes mellitus with hyperglycemia Blood glucose levels are elevated, with occasional readings in the 300s. Previous A1c was 13.7% nine months ago, indicating poor glycemic control. Emphasized the importance of glycemic control to prevent complications such as retinopathy, nephropathy, and cardiovascular disease. Encouraged exercise to aid in blood glucose management. - Order A1c and other diabetes-related labs - Encourage regular blood glucose  monitoring - Advise on exercise to aid in blood glucose management - Adjust insulin  dosage based on lab results  Urinary tract infection Symptoms include dysuria, malodorous urine, and mild pain. Urine dipstick indicates infection with leukocytes and nitrites. Urine culture ordered to identify specific pathogens and antibiotic sensitivity. Initiated empirical antibiotic therapy with ciprofloxacin . Discussed potential need to adjust antibiotics based on culture results. - Order urine culture - Prescribe ciprofloxacin  500 mg twice daily for 7 days - Advise on probiotic or Greek yogurt to prevent antibiotic-associated diarrhea  Dementia Dementia is well-managed with no significant progression. Discussed potential use of donepezil  to slow progression, with caution regarding potential weight loss as a side effect. Agreed to start donepezil  5 mg for one month, then increase to 10 mg if tolerated. Clarified that donepezil  is not curative but may slow progression. - Prescribe donepezil  5 mg daily for one month, then increase to 10 mg if tolerated - Monitor weight regularly  Hyperlipidemia Continues on atorvastatin  20 mg for lipid management. Due for lipid panel. - Order lipid panel  General Health Maintenance Due for mammogram and vaccinations including tetanus, herpes zoster, and COVID-19. Also due for Medicare annual  wellness visit. - Schedule mammogram - Administer tetanus, herpes zoster, and COVID-19 vaccines at pharmacy - Schedule Medicare annual wellness visi  Family/ staff Communication: Reviewed plan of care with patient and daughter verbalized understanding  Labs/tests ordered: - POC Urinalysis Dipstick  - Urine Culture - Urine microalbumin creatine ratio - - CBC with Differential/Platelet - CMP with eGFR([facility]) - TSH - Hgb A1C - Lipid panel   Next Appointment: Return in about 4 months (around 09/06/2023) for medical mangement of chronic issues., fasting labs in one week., Annual wellness visit soon. .   Total time: 30 minutes. Greater than 50% of total time spent doing patient education regarding T2DM,HTN, HLD,Urine Frequency,Dementia without behavioral disturbance ,health maintenance including symptom/medication management.   Estil Heman, NP

## 2023-05-10 ENCOUNTER — Other Ambulatory Visit: Payer: Self-pay | Admitting: Family

## 2023-05-10 ENCOUNTER — Other Ambulatory Visit: Payer: Self-pay | Admitting: Nurse Practitioner

## 2023-05-10 ENCOUNTER — Encounter: Payer: Self-pay | Admitting: Nurse Practitioner

## 2023-05-10 LAB — URINE CULTURE
MICRO NUMBER:: 16390065
SPECIMEN QUALITY:: ADEQUATE

## 2023-05-10 MED ORDER — AMOXICILLIN-POT CLAVULANATE 875-125 MG PO TABS: 1.0000 | ORAL_TABLET | Freq: Two times a day (BID) | ORAL | 0 refills | Status: DC

## 2023-05-10 NOTE — Telephone Encounter (Unsigned)
 Copied from CRM [id number]. Topic: Clinical - Medication Refill >> May 10, 2023  1:02 PM Retta Caster wrote: Most Recent Primary Care Visit:  Provider: Christean Courts C  Department: [facility]  Visit Type: OFFICE VISIT  Date: 05/07/2023  Medication: insulin  NPH-regular Human (70-30) 100 UNIT/ML injection   Has the patient contacted their pharmacy? Yes (Agent: If no, request that the patient contact the pharmacy for the refill. If patient does not wish to contact the pharmacy document the reason why and proceed with request.) (Agent: If yes, when and what did the pharmacy advise?)  Is this the correct pharmacy for this prescription? Yes If no, delete pharmacy and type the correct one.  This is the patient's preferred pharmacy:  [facility] [address] [place] - [address] [place] [address] Phone: [phone] Fax: [phone]   Has the prescription been filled recently? No  Is the patient out of the medication? Yes  Has the patient been seen for an appointment in the last year OR does the patient have an upcoming appointment? Yes  Can we respond through MyChart? Yes  Agent: Please be advised that Rx refills may take up to 3 business days. We ask that you follow-up with your pharmacy.

## 2023-05-13 MED ORDER — INSULIN NPH ISOPHANE & REGULAR (70-30) 100 UNIT/ML ~~LOC~~ SUSP: 6.0000 [IU] | Freq: Two times a day (BID) | SUBCUTANEOUS | 0 refills | Status: DC

## 2023-05-14 ENCOUNTER — Other Ambulatory Visit

## 2023-05-14 DIAGNOSIS — I10 Essential (primary) hypertension: Secondary | ICD-10-CM

## 2023-05-14 DIAGNOSIS — E119 Type 2 diabetes mellitus without complications: Secondary | ICD-10-CM

## 2023-05-14 DIAGNOSIS — E785 Hyperlipidemia, unspecified: Secondary | ICD-10-CM

## 2023-05-20 ENCOUNTER — Other Ambulatory Visit: Payer: Self-pay | Admitting: Family

## 2023-05-20 DIAGNOSIS — F0390 Unspecified dementia without behavioral disturbance: Secondary | ICD-10-CM

## 2023-05-21 ENCOUNTER — Other Ambulatory Visit

## 2023-05-28 ENCOUNTER — Ambulatory Visit: Payer: Self-pay

## 2023-05-28 DIAGNOSIS — R300 Dysuria: Secondary | ICD-10-CM

## 2023-05-28 NOTE — Telephone Encounter (Signed)
  Chief Complaint: UTI FU Symptoms: urinary frequency and odor returned Frequency: several days since completing Cipro  Pertinent Negatives: NA Disposition: [] ED /[] Urgent Care (no appt availability in office) / [] Appointment(In office/virtual)/ []  [facility]/ [x] Home Care/ [] Refused Recommended Disposition /[] [facility]/ []  Follow-up with PCP Additional Notes: called and spoke with pt's daughter Mariah Shines. She states pt completed Cipro  and urinary sx have returned. States she received call about culture but didn't know pt was suppose to switch to Augmentin  for UTI. Mariah Shines [facility] not [facility] so [facility]. Advised to have pt complete that abx x 7 days as prescribed and if sx resume then pt will need to be seen again. Mariah Shines verbalized understanding. No further assistance noted.   Copied from CRM [id number]. Topic: Clinical - Medical Advice >> May 28, 2023 12:33 PM Shelby Dessert H wrote: Reason for CRM: Patient had a UTI and now she is done with her antibiotics and her daughter said she thinks the UTI is back and she is wanting a new rx or does she need to be seen again. Patients daughter callback number is [phone].  Reason for Disposition  [1] Taking antibiotic < 72 hours (3 days) for UTI AND [2] painful urination or frequency is SAME (unchanged, not better)  Answer Assessment - Initial Assessment Questions 1. MAIN SYMPTOM: "What is the main symptom you are concerned about?" (e.g., painful urination, urine frequency)     UTI sx have return after completing abx 2. BETTER-SAME-WORSE: "Are you getting better, staying the same, or getting worse compared to how you felt at your last visit to the doctor (most recent medical visit)?"     Same as before when sx started  5. OTHER SYMPTOMS: "Do you have any other symptoms?" (e.g., blood in the urine, flank pain, vaginal discharge)     Frequency and urinary odor  6. DIAGNOSIS: "When was the UTI  diagnosed?" "By whom?" "Was it a kidney infection, bladder infection or both?"     UTI, PCP  7. ANTIBIOTIC: "What antibiotic(s) are you taking?" "How many times per day?"     Cipro  but changed to Augmentin  on 5/2 8. ANTIBIOTIC - START DATE: "When did you start taking the antibiotic?"     Pt completed Cipro  from 05/07/23  Protocols used: Urinary Tract Infection on Antibiotic Follow-up Call - [facility]

## 2023-05-29 NOTE — Telephone Encounter (Signed)
 Noted, will send to [name], Elijio Guadeloupe, NP as a FYI

## 2023-05-29 NOTE — Telephone Encounter (Signed)
 Mychart message sent with providers recommendations and future order placed for urine culture

## 2023-05-29 NOTE — Telephone Encounter (Signed)
 Recommend collecting urine specimen for culture and sensitivity at least three days after completing antibiotics to rule out UTI

## 2023-05-29 NOTE — Addendum Note (Signed)
 Addended by: Fredric Jean on: 05/29/2023 09:02 AM   Modules accepted: Orders

## 2023-06-19 ENCOUNTER — Other Ambulatory Visit: Payer: Self-pay | Admitting: Family

## 2023-07-11 ENCOUNTER — Emergency Department (HOSPITAL_COMMUNITY)

## 2023-07-11 ENCOUNTER — Other Ambulatory Visit: Payer: Self-pay

## 2023-07-11 ENCOUNTER — Inpatient Hospital Stay (HOSPITAL_COMMUNITY)
Admission: EM | Admit: 2023-07-11 | Discharge: 2023-07-16 | DRG: 062 | Disposition: A | Discharge status: Rehabilitation Facility | Attending: Neurology | Admitting: Neurology

## 2023-07-11 ENCOUNTER — Inpatient Hospital Stay (HOSPITAL_COMMUNITY)

## 2023-07-11 ENCOUNTER — Encounter (HOSPITAL_COMMUNITY): Payer: Self-pay | Admitting: Neurology

## 2023-07-11 DIAGNOSIS — Z801 Family history of malignant neoplasm of trachea, bronchus and lung: Secondary | ICD-10-CM

## 2023-07-11 DIAGNOSIS — R29720 NIHSS score 20: Secondary | ICD-10-CM | POA: Diagnosis present

## 2023-07-11 DIAGNOSIS — I63511 Cerebral infarction due to unspecified occlusion or stenosis of right middle cerebral artery: Secondary | ICD-10-CM | POA: Diagnosis not present

## 2023-07-11 DIAGNOSIS — G8194 Hemiplegia, unspecified affecting left nondominant side: Secondary | ICD-10-CM | POA: Diagnosis not present

## 2023-07-11 DIAGNOSIS — Z794 Long term (current) use of insulin: Secondary | ICD-10-CM | POA: Diagnosis not present

## 2023-07-11 DIAGNOSIS — E785 Hyperlipidemia, unspecified: Secondary | ICD-10-CM | POA: Diagnosis not present

## 2023-07-11 DIAGNOSIS — Z7409 Other reduced mobility: Secondary | ICD-10-CM | POA: Diagnosis present

## 2023-07-11 DIAGNOSIS — I639 Cerebral infarction, unspecified: Secondary | ICD-10-CM

## 2023-07-11 DIAGNOSIS — F02A3 Dementia in other diseases classified elsewhere, mild, with mood disturbance: Secondary | ICD-10-CM | POA: Diagnosis present

## 2023-07-11 DIAGNOSIS — R29810 Facial weakness: Secondary | ICD-10-CM | POA: Diagnosis not present

## 2023-07-11 DIAGNOSIS — R9089 Other abnormal findings on diagnostic imaging of central nervous system: Secondary | ICD-10-CM | POA: Diagnosis not present

## 2023-07-11 DIAGNOSIS — I6389 Other cerebral infarction: Secondary | ICD-10-CM | POA: Diagnosis not present

## 2023-07-11 DIAGNOSIS — G936 Cerebral edema: Secondary | ICD-10-CM | POA: Diagnosis not present

## 2023-07-11 DIAGNOSIS — I63231 Cerebral infarction due to unspecified occlusion or stenosis of right carotid arteries: Principal | ICD-10-CM

## 2023-07-11 DIAGNOSIS — R197 Diarrhea, unspecified: Secondary | ICD-10-CM | POA: Diagnosis not present

## 2023-07-11 DIAGNOSIS — I63523 Cerebral infarction due to unspecified occlusion or stenosis of bilateral anterior cerebral arteries: Secondary | ICD-10-CM | POA: Diagnosis not present

## 2023-07-11 DIAGNOSIS — E871 Hypo-osmolality and hyponatremia: Secondary | ICD-10-CM | POA: Diagnosis not present

## 2023-07-11 DIAGNOSIS — I6782 Cerebral ischemia: Secondary | ICD-10-CM | POA: Diagnosis not present

## 2023-07-11 DIAGNOSIS — F319 Bipolar disorder, unspecified: Secondary | ICD-10-CM | POA: Diagnosis present

## 2023-07-11 DIAGNOSIS — Z7982 Long term (current) use of aspirin: Secondary | ICD-10-CM | POA: Diagnosis not present

## 2023-07-11 DIAGNOSIS — R531 Weakness: Secondary | ICD-10-CM | POA: Diagnosis not present

## 2023-07-11 DIAGNOSIS — I69391 Dysphagia following cerebral infarction: Secondary | ICD-10-CM | POA: Diagnosis not present

## 2023-07-11 DIAGNOSIS — F17210 Nicotine dependence, cigarettes, uncomplicated: Secondary | ICD-10-CM | POA: Diagnosis present

## 2023-07-11 DIAGNOSIS — R1312 Dysphagia, oropharyngeal phase: Secondary | ICD-10-CM | POA: Diagnosis not present

## 2023-07-11 DIAGNOSIS — R1310 Dysphagia, unspecified: Secondary | ICD-10-CM | POA: Diagnosis not present

## 2023-07-11 DIAGNOSIS — R4781 Slurred speech: Secondary | ICD-10-CM | POA: Diagnosis not present

## 2023-07-11 DIAGNOSIS — E440 Moderate protein-calorie malnutrition: Secondary | ICD-10-CM | POA: Diagnosis not present

## 2023-07-11 DIAGNOSIS — R29719 NIHSS score 19: Secondary | ICD-10-CM

## 2023-07-11 DIAGNOSIS — Z79899 Other long term (current) drug therapy: Secondary | ICD-10-CM | POA: Diagnosis not present

## 2023-07-11 DIAGNOSIS — I708 Atherosclerosis of other arteries: Secondary | ICD-10-CM | POA: Diagnosis present

## 2023-07-11 DIAGNOSIS — R471 Dysarthria and anarthria: Secondary | ICD-10-CM | POA: Diagnosis present

## 2023-07-11 DIAGNOSIS — R414 Neurologic neglect syndrome: Secondary | ICD-10-CM | POA: Diagnosis not present

## 2023-07-11 DIAGNOSIS — R4701 Aphasia: Secondary | ICD-10-CM | POA: Diagnosis present

## 2023-07-11 DIAGNOSIS — I959 Hypotension, unspecified: Secondary | ICD-10-CM | POA: Diagnosis not present

## 2023-07-11 DIAGNOSIS — F0283 Dementia in other diseases classified elsewhere, unspecified severity, with mood disturbance: Secondary | ICD-10-CM | POA: Diagnosis not present

## 2023-07-11 DIAGNOSIS — I69354 Hemiplegia and hemiparesis following cerebral infarction affecting left non-dominant side: Secondary | ICD-10-CM | POA: Diagnosis not present

## 2023-07-11 DIAGNOSIS — Z9181 History of falling: Secondary | ICD-10-CM

## 2023-07-11 DIAGNOSIS — I10 Essential (primary) hypertension: Secondary | ICD-10-CM | POA: Diagnosis present

## 2023-07-11 DIAGNOSIS — F17200 Nicotine dependence, unspecified, uncomplicated: Secondary | ICD-10-CM | POA: Diagnosis not present

## 2023-07-11 DIAGNOSIS — E1165 Type 2 diabetes mellitus with hyperglycemia: Secondary | ICD-10-CM | POA: Diagnosis not present

## 2023-07-11 DIAGNOSIS — Z4682 Encounter for fitting and adjustment of non-vascular catheter: Secondary | ICD-10-CM | POA: Diagnosis not present

## 2023-07-11 DIAGNOSIS — E46 Unspecified protein-calorie malnutrition: Secondary | ICD-10-CM | POA: Diagnosis not present

## 2023-07-11 DIAGNOSIS — R29711 NIHSS score 11: Secondary | ICD-10-CM | POA: Diagnosis not present

## 2023-07-11 DIAGNOSIS — D72829 Elevated white blood cell count, unspecified: Secondary | ICD-10-CM | POA: Diagnosis present

## 2023-07-11 DIAGNOSIS — R198 Other specified symptoms and signs involving the digestive system and abdomen: Secondary | ICD-10-CM | POA: Diagnosis not present

## 2023-07-11 DIAGNOSIS — J439 Emphysema, unspecified: Secondary | ICD-10-CM | POA: Diagnosis not present

## 2023-07-11 DIAGNOSIS — Z7984 Long term (current) use of oral hypoglycemic drugs: Secondary | ICD-10-CM

## 2023-07-11 DIAGNOSIS — F32A Depression, unspecified: Secondary | ICD-10-CM | POA: Diagnosis present

## 2023-07-11 DIAGNOSIS — I672 Cerebral atherosclerosis: Secondary | ICD-10-CM | POA: Diagnosis not present

## 2023-07-11 DIAGNOSIS — Z860101 Personal history of adenomatous and serrated colon polyps: Secondary | ICD-10-CM

## 2023-07-11 DIAGNOSIS — E119 Type 2 diabetes mellitus without complications: Secondary | ICD-10-CM

## 2023-07-11 DIAGNOSIS — Z833 Family history of diabetes mellitus: Secondary | ICD-10-CM | POA: Diagnosis not present

## 2023-07-11 DIAGNOSIS — Z6822 Body mass index (BMI) 22.0-22.9, adult: Secondary | ICD-10-CM

## 2023-07-11 DIAGNOSIS — R739 Hyperglycemia, unspecified: Secondary | ICD-10-CM | POA: Diagnosis not present

## 2023-07-11 DIAGNOSIS — R29818 Other symptoms and signs involving the nervous system: Secondary | ICD-10-CM | POA: Diagnosis not present

## 2023-07-11 DIAGNOSIS — F01A0 Vascular dementia, mild, without behavioral disturbance, psychotic disturbance, mood disturbance, and anxiety: Secondary | ICD-10-CM | POA: Diagnosis not present

## 2023-07-11 DIAGNOSIS — I6521 Occlusion and stenosis of right carotid artery: Secondary | ICD-10-CM | POA: Diagnosis present

## 2023-07-11 DIAGNOSIS — I779 Disorder of arteries and arterioles, unspecified: Secondary | ICD-10-CM | POA: Diagnosis not present

## 2023-07-11 DIAGNOSIS — K219 Gastro-esophageal reflux disease without esophagitis: Secondary | ICD-10-CM | POA: Diagnosis present

## 2023-07-11 DIAGNOSIS — F03A0 Unspecified dementia, mild, without behavioral disturbance, psychotic disturbance, mood disturbance, and anxiety: Secondary | ICD-10-CM | POA: Insufficient documentation

## 2023-07-11 DIAGNOSIS — J449 Chronic obstructive pulmonary disease, unspecified: Secondary | ICD-10-CM | POA: Diagnosis present

## 2023-07-11 HISTORY — DX: Cerebral infarction, unspecified: I63.9

## 2023-07-11 LAB — DIFFERENTIAL
Abs Immature Granulocytes: 0.05 10*3/uL (ref 0.00–0.07)
Basophils Absolute: 0.1 10*3/uL (ref 0.0–0.1)
Basophils Relative: 1 %
Eosinophils Absolute: 0 10*3/uL (ref 0.0–0.5)
Eosinophils Relative: 0 %
Immature Granulocytes: 1 %
Lymphocytes Relative: 25 %
Lymphs Abs: 2.5 10*3/uL (ref 0.7–4.0)
Monocytes Absolute: 0.8 10*3/uL (ref 0.1–1.0)
Monocytes Relative: 8 %
Neutro Abs: 6.7 10*3/uL (ref 1.7–7.7)
Neutrophils Relative %: 65 %

## 2023-07-11 LAB — GLUCOSE, CAPILLARY: Glucose-Capillary: 184 mg/dL — ABNORMAL HIGH (ref 70–99)

## 2023-07-11 LAB — COMPREHENSIVE METABOLIC PANEL WITH GFR
ALT: 17 U/L (ref 0–44)
AST: 21 U/L (ref 15–41)
Albumin: 3.7 g/dL (ref 3.5–5.0)
Alkaline Phosphatase: 63 U/L (ref 38–126)
Anion gap: 14 (ref 5–15)
BUN: 11 mg/dL (ref 8–23)
CO2: 22 mmol/L (ref 22–32)
Calcium: 9.4 mg/dL (ref 8.9–10.3)
Chloride: 94 mmol/L — ABNORMAL LOW (ref 98–111)
Creatinine, Ser: 0.74 mg/dL (ref 0.44–1.00)
GFR, Estimated: >60 mL/min (ref >60)
Glucose, Bld: 344 mg/dL — ABNORMAL HIGH (ref 70–99)
Potassium: 4.3 mmol/L (ref 3.5–5.1)
Sodium: 130 mmol/L — ABNORMAL LOW (ref 135–145)
Total Bilirubin: 0.4 mg/dL (ref 0.0–1.2)
Total Protein: 6.9 g/dL (ref 6.5–8.1)

## 2023-07-11 LAB — CBC
HCT: 38.5 % (ref 36.0–46.0)
Hemoglobin: 12.3 g/dL (ref 12.0–15.0)
MCH: 25.7 pg — ABNORMAL LOW (ref 26.0–34.0)
MCHC: 31.9 g/dL (ref 30.0–36.0)
MCV: 80.4 fL (ref 80.0–100.0)
Platelets: 226 10*3/uL (ref 150–400)
RBC: 4.79 MIL/uL (ref 3.87–5.11)
RDW: 13.3 % (ref 11.5–15.5)
WBC: 10.1 10*3/uL (ref 4.0–10.5)
nRBC: 0 % (ref 0.0–0.2)

## 2023-07-11 LAB — CBG MONITORING, ED: Glucose-Capillary: 322 mg/dL — ABNORMAL HIGH (ref 70–99)

## 2023-07-11 LAB — HEMOGLOBIN A1C
Hgb A1c MFr Bld: 12.3 % — ABNORMAL HIGH (ref 4.8–5.6)
Mean Plasma Glucose: 306.31 mg/dL

## 2023-07-11 LAB — RESP PANEL BY RT-PCR (RSV, FLU A&B, COVID)  RVPGX2
Influenza A by PCR: NEGATIVE
Influenza B by PCR: NEGATIVE
Resp Syncytial Virus by PCR: NEGATIVE
SARS Coronavirus 2 by RT PCR: NEGATIVE

## 2023-07-11 LAB — APTT: aPTT: 23 s — ABNORMAL LOW (ref 24–36)

## 2023-07-11 LAB — PROTIME-INR
INR: 1 (ref 0.8–1.2)
Prothrombin Time: 14.2 s (ref 11.4–15.2)

## 2023-07-11 LAB — ETHANOL: Alcohol, Ethyl (B): <15 mg/dL (ref <15)

## 2023-07-11 LAB — MRSA NEXT GEN BY PCR, NASAL: MRSA by PCR Next Gen: NOT DETECTED

## 2023-07-11 MED ORDER — SENNOSIDES-DOCUSATE SODIUM 8.6-50 MG PO TABS: 1.0000 | ORAL_TABLET | Freq: Every evening | ORAL | Status: DC | PRN

## 2023-07-11 MED ORDER — CHLORHEXIDINE GLUCONATE CLOTH 2 % EX PADS
6.0000 | MEDICATED_PAD | Freq: Every day | CUTANEOUS | Status: DC
  Administered 2023-07-11 – 2023-07-16 (×4): 6 via TOPICAL

## 2023-07-11 MED ORDER — TENECTEPLASE FOR STROKE
0.2500 mg/kg | PACK | Freq: Once | INTRAVENOUS | Status: AC
  Administered 2023-07-11: 14 mg via INTRAVENOUS
  Filled 2023-07-11: qty 10

## 2023-07-11 MED ORDER — IOHEXOL 350 MG/ML SOLN
75.0000 mL | Freq: Once | INTRAVENOUS | Status: AC | PRN
  Administered 2023-07-11: 75 mL via INTRAVENOUS

## 2023-07-11 MED ORDER — ACETAMINOPHEN 160 MG/5ML PO SOLN: 650.0000 mg | ORAL | Status: DC | PRN

## 2023-07-11 MED ORDER — SODIUM CHLORIDE 0.9 % IV BOLUS
1000.0000 mL | Freq: Once | INTRAVENOUS | Status: AC
  Administered 2023-07-11: 1000 mL via INTRAVENOUS

## 2023-07-11 MED ORDER — SODIUM CHLORIDE 0.9% FLUSH: 3.0000 mL | Freq: Once | INTRAVENOUS | Status: DC

## 2023-07-11 MED ORDER — LABETALOL HCL 5 MG/ML IV SOLN
10.0000 mg | INTRAVENOUS | Status: DC | PRN
  Administered 2023-07-11: 20 mg via INTRAVENOUS

## 2023-07-11 MED ORDER — SODIUM CHLORIDE 0.9 % IV SOLN: INTRAVENOUS | Status: AC

## 2023-07-11 MED ORDER — STROKE: EARLY STAGES OF RECOVERY BOOK: Freq: Once | Status: AC

## 2023-07-11 MED ORDER — ACETAMINOPHEN 325 MG PO TABS
650.0000 mg | ORAL_TABLET | ORAL | Status: DC | PRN
  Administered 2023-07-15: 650 mg via ORAL
  Filled 2023-07-11: qty 2

## 2023-07-11 MED ORDER — ACETAMINOPHEN 650 MG RE SUPP: 650.0000 mg | RECTAL | Status: DC | PRN

## 2023-07-11 MED ORDER — SODIUM CHLORIDE 0.9 % IV BOLUS
500.0000 mL | Freq: Once | INTRAVENOUS | Status: AC
  Administered 2023-07-11: 500 mL via INTRAVENOUS

## 2023-07-11 MED ORDER — PANTOPRAZOLE SODIUM 40 MG IV SOLR
40.0000 mg | Freq: Every day | INTRAVENOUS | Status: DC
  Administered 2023-07-11 – 2023-07-15 (×5): 40 mg via INTRAVENOUS
  Filled 2023-07-11 (×5): qty 10

## 2023-07-11 MED ORDER — PHENYLEPHRINE HCL-NACL 20-0.9 MG/250ML-% IV SOLN
0.0000 ug/min | INTRAVENOUS | Status: DC
  Administered 2023-07-12: 20 ug/min via INTRAVENOUS
  Filled 2023-07-11: qty 250

## 2023-07-11 MED ORDER — INSULIN ASPART 100 UNIT/ML IJ SOLN
0.0000 [IU] | INTRAMUSCULAR | Status: DC
  Administered 2023-07-11: 3 [IU] via SUBCUTANEOUS
  Administered 2023-07-12: 5 [IU] via SUBCUTANEOUS
  Administered 2023-07-12 – 2023-07-13 (×3): 3 [IU] via SUBCUTANEOUS
  Administered 2023-07-13: 5 [IU] via SUBCUTANEOUS
  Administered 2023-07-13: 2 [IU] via SUBCUTANEOUS
  Administered 2023-07-13: 5 [IU] via SUBCUTANEOUS
  Administered 2023-07-13 – 2023-07-14 (×2): 8 [IU] via SUBCUTANEOUS
  Administered 2023-07-14: 15 [IU] via SUBCUTANEOUS
  Administered 2023-07-14: 5 [IU] via SUBCUTANEOUS
  Administered 2023-07-14 (×2): 8 [IU] via SUBCUTANEOUS
  Administered 2023-07-14: 5 [IU] via SUBCUTANEOUS
  Administered 2023-07-15 (×2): 8 [IU] via SUBCUTANEOUS
  Administered 2023-07-15: 2 [IU] via SUBCUTANEOUS
  Administered 2023-07-15: 3 [IU] via SUBCUTANEOUS
  Administered 2023-07-15: 15 [IU] via SUBCUTANEOUS
  Administered 2023-07-15: 3 [IU] via SUBCUTANEOUS
  Administered 2023-07-16: 8 [IU] via SUBCUTANEOUS
  Administered 2023-07-16: 2 [IU] via SUBCUTANEOUS
  Administered 2023-07-16: 8 [IU] via SUBCUTANEOUS
  Administered 2023-07-16 (×2): 2 [IU] via SUBCUTANEOUS

## 2023-07-11 NOTE — Code Documentation (Signed)
 Stroke Response Nurse Documentation Code Documentation  Kathryn Mcguire is a 73 y.o. female arriving to [facility]  via [facility] on 7/3 with past medical hx of dementia, htn, hld, COPD. On No antithrombotic. Code stroke was activated by EMS.   Patient from home where she was LKW at 1330 and now complaining of left sided weakness, L facial droop, and R gaze. Her family noticed she fell this morning but did not have any neurological deficits after the fall and had acute onset of above symptoms at 1330.    Stroke team at the bedside on patient arrival. Labs drawn and patient cleared for CT by Dr. Yolande. Patient to CT with team. NIHSS 20, see documentation for details and code stroke times. Patient with disoriented, right gaze preference , left hemianopia, left facial droop, left arm weakness, left leg weakness, left decreased sensation, Expressive aphasia , dysarthria , and left neglect on exam. The following imaging was completed:  CT Head and CTA. Patient is a candidate for IV Thrombolytic due to fixed neuro deficit onset within 4.5 hours. TNK given 1508. Patient is not a candidate for IR due to provider and family discretion.   Care Plan: q15 NIHSS x 2 hours, q30 x 6 hours, q1 x 16 hours. BP <180/105.   Bedside handoff with ED RN Caron.    Lauraine LITTIE Searle  Stroke Response RN

## 2023-07-11 NOTE — ED Notes (Signed)
 Pt in bed, pt more awake, pt moving R arm and leg

## 2023-07-11 NOTE — Progress Notes (Signed)
 Attempted scheduled echocardiogram @ 3:27pm. Pt OTF, not in room. Will re-attempt

## 2023-07-11 NOTE — ED Notes (Signed)
 Talked with MD Michaela, plan to use the R arm for blood pressure readings due to some stenosis, plan to give 500 of the Liter that is ordered and to start neo if systolic is less than 110.

## 2023-07-11 NOTE — H&P (Addendum)
 NEUROLOGY H&P NOTE   Date of service: July 11, 2023 Patient Name: Kathryn Mcguire MRN:  [id number] DOB:  Apr 22, 1950 Chief Complaint: Code stroke   History of Present Illness  Kathryn Mcguire is a 73 y.o. female with hx of bipolar, dementia, DM, HTN, HLD, COPD who presents to [facility] ED via EMS form home. EMS activated a Code stroke for acute onset of left side weakness, left facial droop and right gaze preference. LKW 1330. EMS reports a witnessed fall this morning with no neurological deficits. She was sitting on the edge of the bed when she slid of the bed and family noticed left side, facial droop and gaze preference. NIHSS 20. CT head with no acute process. CTA head and neck with right internal carotid occlusion  She was deemed an appropriate candidate for TNK. She was administered TNK @ 1515.  At baseline she can hold a conversations, can ambulate, wears depends most times but can occasionally make it to the bathroom, can not cook or clean.  Dr. Michaela spoke at length with both children in regards to CTA results and options and agreed to not proceed with IR.   Risks, benefits and alternatives of IVT discussed with family son (in person) and daughter (via phone) and they agreed. CTH personally reviewed by Dr. Michaela prior to TNK administration   Last known well: 1330 Modified rankin score: 3-Moderate disability-requires help but walks WITHOUT assistance IV Thrombolysis: Yes  Thrombectomy:  no   NIHSS components Score: Comment  1a Level of Conscious 0[x]  1[]  2[]  3[]      1b LOC Questions 0[]  1[]  2[x]       1c LOC Commands 0[x]  1[]  2[]       2 Best Gaze 0[]  1[]  2[x]       3 Visual 0[]  1[]  2[x]  3[]      4 Facial Palsy 0[]  1[]  2[x]  3[]      5a Motor Arm - left 0[]  1[]  2[x]  3[]  4[]  UN[]    5b Motor Arm - Right 0[x]  1[]  2[]  3[]  4[]  UN[]    6a Motor Leg - Left 0[]  1[]  2[x]  3[]  4[]  UN[]    6b Motor Leg - Right 0[x]  1[]  2[]  3[]  4[]  UN[]    7 Limb Ataxia 0[x]  1[]  2[]  UN[]      8 Sensory  0[]  1[x]  2[]  UN[]      9 Best Language 0[]  1[]  2[x]  3[]      10 Dysarthria 0[]  1[]  2[x]  UN[]      11 Extinct. and Inattention 0[]  1[]  2[x]       TOTAL: 19      ROS   Comprehensive ROS Unable to perform due to aphasia   Past History   Past Medical History:  Diagnosis Date   Arthritis    Bipolar depression (HCC)    COPD (chronic obstructive pulmonary disease) (HCC)    Dementia (HCC)    Diabetes mellitus without complication (HCC)    Elevated LFTs    Emphysema of lung (HCC)    GERD (gastroesophageal reflux disease)    History of adenomatous polyps of colon 10/15/2013   Hyperlipemia    Hypertension    UTI (lower urinary tract infection)    Past Surgical History:  Procedure Laterality Date   COLONOSCOPY     ESOPHAGOGASTRODUODENOSCOPY     TONSILLECTOMY     TONSILLECTOMY AND ADENOIDECTOMY     TUBAL LIGATION     Family History  Problem Relation Age of Onset   Diabetes Mother    Lung cancer Sister    Diabetes Daughter  Lupus Daughter    Migraines Daughter        chronic   Bursitis Daughter    Colon cancer Neg Hx    Stomach cancer Neg Hx    Esophageal cancer Neg Hx    Rectal cancer Neg Hx    Social History   Socioeconomic History   Marital status: Single    Spouse name: Not on file   Number of children: 4   Years of education: Not on file   Highest education level: Not on file  Occupational History   Occupation: Retired   Tobacco Use   Smoking status: Every Day    Types: Cigarettes   Smokeless tobacco: Current    Types: Chew   Tobacco comments:    Smokes maybe one cigarette a week. But chews everyday.  Vaping Use   Vaping status: Never Used  Substance and Sexual Activity   Alcohol use: Not Currently    Comment:     Drug use: No   Sexual activity: Not on file  Other Topics Concern   Not on file  Social History Narrative   Pt lives in single story home with her husband, daughter and granddaughter   Has 4 children   Some college education   Last place of  employment - [employer]      Social History      Diet?  Eats/drinks excessively       Do you drink/eat things with caffeine? yes      Marital status?           divorced                        What year were you married?      Do you live in a house, apartment, assisted living, condo, trailer, etc.? house      Is it one or more stories? 1      How many persons live in your home? 4      Do you have any pets in your home? Yes dog      Highest level of education completed? Some college      Current or past profession: retail and cna      Do you exercise?           yes                           Type & how often? Walking daily       Advanced Directives      Do you have a living will? No       Do you have a DNR form?                                  If not, do you want to discuss one?      Do you have signed POA/HPOA for forms? In process      Functional Status      Do you have difficulty bathing or dressing yourself? yes      Do you have difficulty preparing food or eating? yes      Do you have difficulty managing your medications? yes      Do you have difficulty managing your finances? No       Do you have difficulty affording your medications? yes      Social Drivers  of Health   Financial Resource Strain: Not on file  Food Insecurity: Not on file  Transportation Needs: Not on file  Physical Activity: Not on file  Stress: Not on file  Social Connections: Not on file   No Known Allergies  Medications   Current Facility-Administered Medications:    sodium chloride  flush (NS) 0.9 % injection 3 mL, 3 mL, Intravenous, Once, Horton, Kristie M, DO   tenecteplase (TNKASE) injection for Stroke 14 mg, 0.25 mg/kg, Intravenous, Once, Michaela, Aisha SQUIBB, MD  Current Outpatient Medications:    acetaminophen  (TYLENOL ) 325 MG tablet, Take 2 tablets (650 mg total) by mouth every 6 (six) hours as needed for moderate pain., Disp: 30 tablet, Rfl: 0   amoxicillin -clavulanate  (AUGMENTIN ) 875-125 MG tablet, Take 1 tablet by mouth 2 (two) times daily., Disp: 14 tablet, Rfl: 0   aspirin  EC 81 MG tablet, Take 1 tablet (81 mg total) by mouth daily. Swallow whole. (Patient not taking: Reported on 05/07/2023), Disp: 90 tablet, Rfl: 0   atorvastatin  (LIPITOR) 20 MG tablet, TAKE 1 TABLET BY MOUTH EVERY DAY, Disp: 90 tablet, Rfl: 1   Blood Glucose Monitoring Suppl (ONE TOUCH ULTRA 2) w/Device KIT, USE TO TEST BLOOD SUGAR ONCE DAILY, Disp: 1 kit, Rfl: 0   donepezil  (ARICEPT ) 5 MG tablet, TAKE ONE TABLET MOUTH DAILY X 1 MONTH THEN INCREASE TO 2 TABLET DAILY., Disp: 180 tablet, Rfl: 1   Glucerna (GLUCERNA) LIQD, Take 237 mLs by mouth 2 (two) times daily between meals., Disp: 1500 mL, Rfl: 3   glucose blood (ONETOUCH ULTRA) test strip, USE TO TEST BLOOD SUGAR ONCE DAILY, Disp: 100 strip, Rfl: 1   insulin  NPH-regular Human (NOVOLIN  70/30) (70-30) 100 UNIT/ML injection, Inject 6 Units into the skin 2 (two) times daily with a meal., Disp: 30 mL, Rfl: 1   Insulin  Pen Needle 32G X 4 MM MISC, 1 Device by Does not apply route in the morning and at bedtime., Disp: 100 each, Rfl: 3   Lancets (ONETOUCH ULTRASOFT) lancets, Use to test blood sugar once daily DX: E11.9, Disp: 100 each, Rfl: 1   metFORMIN  (GLUCOPHAGE ) 1000 MG tablet, TAKE 1 TABLET (1,000 MG TOTAL) BY MOUTH TWICE A DAY WITH FOOD, Disp: 180 tablet, Rfl: 1   Vitals   Vitals:   07/11/23 1400  Weight: 55.4 kg     Body mass index is 22.34 kg/m.  Physical Exam   Constitutional: acutely ill elderly female  Psych: Affect appropriate to situation.   Eyes: No scleral injection.   HENT: No OP obstruction.   Head: Normocephalic.   Cardiovascular: Normal rate and regular rhythm.   Respiratory: Effort normal, non-labored breathing.   GI: Soft.  No distension. There is no tenderness.   Skin: WDI.    Neurologic Examination   Mental Status -  She is awake and alert, she is confused. She is oriented to self, age  stated 37, month  stated  October. She is aphasic, and dysarthric, can follow commands  Cranial Nerves II - XII - II - Visual field no blink to threat on left . III, IV, VI - right gaze preference, does not cross midline V - Facial sensation intact bilaterally . VII - left facial droop VIII - Hearing & vestibular intact bilaterally . X - Palate elevates symmetrically . XI - Chin turning & shoulder shrug intact bilaterally . XII - Tongue protrusion intact .  Motor Strength - Right arm and right leg can hold against gravity without drift, left arm  and left leg with drift   Bulk was normal and fasciculations were absent.   Motor Tone - Muscle tone was assessed at the neck and appendages and was normal . Sensory - decreased on left  Coordination - unable to assess  Gait and Station - deferred.  Labs   CBC: No results for input(s): WBC, NEUTROABS, HGB, HCT, MCV, PLT in the last 168 hours. Basic Metabolic Panel:  Lab Results  Component Value Date   NA 131 (L) 07/17/2022   K 4.9 07/17/2022   CO2 30 07/17/2022   GLUCOSE 413 (H) 07/17/2022   BUN 19 07/17/2022   CREATININE 0.69 07/17/2022   CALCIUM  10.4 07/17/2022   GFRNONAA 79 01/18/2020   GFRAA 91 01/18/2020   Lipid Panel:  Lab Results  Component Value Date   LDLCALC 128 (H) 07/17/2022   HgbA1c:  Lab Results  Component Value Date   HGBA1C 13.7 (H) 07/17/2022   Urine Drug Screen:     Component Value Date/Time   LABOPIA NONE DETECTED 08/15/2017 1911   COCAINSCRNUR NONE DETECTED 08/15/2017 1911   LABBENZ NONE DETECTED 08/15/2017 1911   AMPHETMU NONE DETECTED 08/15/2017 1911   THCU NONE DETECTED 08/15/2017 1911   LABBARB NONE DETECTED 08/15/2017 1911    Alcohol Level     Component Value Date/Time   ETH <10 08/15/2017 1948   INR  Lab Results  Component Value Date   INR 1.01 01/14/2017   APTT No results found for: APTT   CT Head without contrast(Personally reviewed):  No acute process  CT angio Head and Neck with  contrast(Personally reviewed):  Occlusion of the right internal carotid artery from the proximal cervical segment to the cavernous segment as described above. Reconstitution at the paraclinoid segment likely via the circle-of-Willis.   There is decreased caliber of right MCA branches with fewer second and third order branches of the right MCA compared to the left. Similarly there are decreased caliber and decreased number of right ACA branches compared to the left. There is no focal occlusion identified within the right MCA or right ACA. Suspect findings are related to right ICA occlusion.   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 Multifocal stenosis and irregularity of the left V2 segment. 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   Moderate stenosis of the P2 segment right PCA. Moderate stenosis at the origin of the left SCA.   Prominent noncalcified atherosclerotic plaque in the proximal left subclavian artery resulting in moderate to severe stenosis.     Assessment   REX MAGEE is a 73 y.o. female   hx of bipolar, dementia, DM, HTN, HLD, COPD who presents to [facility] ED via EMS form home. EMS activated a Code stroke for acute onset of left side weakness, left facial droop and right gaze preference. LKW 1330. She received TNK  Primary Diagnosis:  Acute right MCA ischemic infarct   Secondary Diagnosis: Essential (primary) hypertension and Type 2 diabetes mellitus w/o complications  Recommendations  - admit to 4N ICU  - HgbA1c, fasting lipid panel - CBC, BMP in am  - UA - SCD's - BP goal <180/105 for 24 hrs post TNK - MRI of the brain without contrast - Frequent neuro checks - Echocardiogram - No Antiplatelet meds for 24 hrs until after 24 hr brain imaging negative for hemorrhage - bedside swallow  screen. If passes may have heart  healthy diet. If fails may need NG tube - statin  -labetalol PRN to maintain BP goal  - Risk factor modification - Telemetry monitoring - PT consult, OT consult, Speech consult - Stroke team to follow ______________________________________________________________________   Signed, Karna DELENA Geralds, NP [facility]  I have seen the patient and reviewed the above note.  She presents with right hemispheric syndrome in the setting of a right ICA occlusion.  At baseline, the patient needs help with multiple activities of daily living including bathing, wearing a diaper.  She does still have quality of life and is able to perform crossword's and is interactive.  She had a sudden onset change while sitting on the side of her bed around 1:30 PM, and was given IV TNK after emergent evaluation.  She does have what I suspect is an acute carotid occlusion, but she has persistent opacification of the intracranial vessels.  Given her already compromised state, I think that the risks of intervention would be significant, and would likely include an acute stent with dual antiplatelet therapy and would include the risk of distal embolization sacrificing the blood flow that is currently there, or having intracranial hemorrhage in the setting of TNK plus antiplatelets which would likely necessitate stopping antiplatelets in the setting of an acute stent.  After discussion with the family, and neurointerventional radiology, I indicated that I favored not pursuing emergent carotid intervention and admitting to the ICU for close monitoring post TNK.  She will be admitted to the ICU for close monitoring.  I do think that avoiding hypotension is important enough in the setting, that if she were to drop her blood pressure lower than 110 systolic, I would favor augmenting with Neo-Synephrine.  She has already received fluid resuscitation.  Of note, she has significant subclavian stenosis on the left and has a  difference in her blood pressures with the left being lower.  I would favor going with the pressures on her right arm.  This patient is critically ill and at significant risk of neurological worsening, death and care requires constant monitoring of vital signs, hemodynamics,respiratory and cardiac monitoring, neurological assessment, discussion with family, other specialists and medical decision making of high complexity. I spent 65 minutes of neurocritical care time  in the care of  this patient. This was time spent independent of any time provided by nurse practitioner or PA.  Aisha Seals, MD [facility]   If 7pm- 7am, please page neurology on call as listed in AMION. 07/11/2023  9:30 PM

## 2023-07-11 NOTE — ED Notes (Signed)
 Pt has pulse in L arm via doppler, L arm is cool to the touch, R arm is warm and has palpable pulse, L leg is also cooler than R, MD notified

## 2023-07-11 NOTE — Progress Notes (Signed)
 PHARMACIST CODE STROKE RESPONSE  Notified to mix TNK at 1504 by Dr. Michaela TNK preparation completed at 1506  TNK dose = 14 mg IV over 5 seconds.   Issues/delays encountered (if applicable): Confirmation of accurate BP  Dorn Poot 07/11/23 3:10 PM

## 2023-07-11 NOTE — ED Notes (Signed)
 Pt back from ct, md, rn times three and son at bedside

## 2023-07-11 NOTE — ED Provider Notes (Signed)
 [place] EMERGENCY DEPARTMENT AT [facility] Provider Note   CSN: [id number] Arrival date & time: 07/11/23  1453     Patient presents with: No chief complaint on file.   Kathryn Mcguire is a 73 y.o. female.   HPI   73 year old female presents emergency department as a code stroke.  Patient evaluated with the stroke neurology team at bedside.  Patient taken emergently to CT scan.  Report from EMS is that patient had an acute change in mental status with unilateral weakness around 130.  Paged out as LVO positive prior to arrival.  Fingerstick appropriate.  Vital stable in rounds.  On arrival the patient is nonverbal, neglecting.  History and physical exam limited secondary to acuity.  Level 5 caveat.  Prior to Admission medications   Medication Sig Start Date End Date Taking? Authorizing Provider  acetaminophen  (TYLENOL ) 325 MG tablet Take 2 tablets (650 mg total) by mouth every 6 (six) hours as needed for moderate pain. 10/21/22   Christopher Savannah, PA-C  amoxicillin -clavulanate (AUGMENTIN ) 875-125 MG tablet Take 1 tablet by mouth 2 (two) times daily. 05/10/23   Caro Harlene POUR, NP  aspirin  EC 81 MG tablet Take 1 tablet (81 mg total) by mouth daily. Swallow whole. Patient not taking: Reported on 05/07/2023 05/16/21   [name], [name] C, NP  atorvastatin  (LIPITOR) 20 MG tablet TAKE 1 TABLET BY MOUTH EVERY DAY 02/04/23   [name], [name] C, NP  Blood Glucose Monitoring Suppl (ONE TOUCH ULTRA 2) w/Device KIT USE TO TEST BLOOD SUGAR ONCE DAILY 12/11/22   [name], [name] C, NP  donepezil  (ARICEPT ) 5 MG tablet TAKE ONE TABLET MOUTH DAILY X 1 MONTH THEN INCREASE TO 2 TABLET DAILY. 05/20/23   [name], [name] C, NP  Glucerna (GLUCERNA) LIQD Take 237 mLs by mouth 2 (two) times daily between meals. 05/16/21   [name], [name] C, NP  glucose blood (ONETOUCH ULTRA) test strip USE TO TEST BLOOD SUGAR ONCE DAILY 07/17/22   [name], [name] C, NP  insulin  NPH-regular Human (NOVOLIN  70/30) (70-30) 100 UNIT/ML  injection Inject 6 Units into the skin 2 (two) times daily with a meal. 06/19/23   [name], [name] C, NP  Insulin  Pen Needle 32G X 4 MM MISC 1 Device by Does not apply route in the morning and at bedtime. 07/17/22   [name], [name] C, NP  Lancets (ONETOUCH ULTRASOFT) lancets Use to test blood sugar once daily DX: E11.9 07/17/22   [name], [name] C, NP  metFORMIN  (GLUCOPHAGE ) 1000 MG tablet TAKE 1 TABLET (1,000 MG TOTAL) BY MOUTH TWICE A DAY WITH FOOD 10/09/22   [name], [name], NP    Allergies: Patient has no known allergies.    Review of Systems  Unable to perform ROS: Acuity of condition    Updated Vital Signs Wt 55.4 kg   BMI 22.34 kg/m   Physical Exam Constitutional:      Comments: Opens eyes to name  Cardiovascular:     Rate and Rhythm: Normal rate.  Pulmonary:     Effort: Pulmonary effort is normal.  Musculoskeletal:        General: No deformity.  Skin:    General: Skin is warm.  Neurological:     Comments: Neglecting, non verbal, LVO positive     (all labs ordered are listed, but only abnormal results are displayed) Labs Reviewed  APTT - Abnormal; Notable for the following components:      Result Value   aPTT 23 (*)    All other  components within normal limits  CBC - Abnormal; Notable for the following components:   MCH 25.7 (*)    All other components within normal limits  CBG MONITORING, ED - Abnormal; Notable for the following components:   Glucose-Capillary 322 (*)    All other components within normal limits  PROTIME-INR  DIFFERENTIAL  COMPREHENSIVE METABOLIC PANEL WITH GFR  ETHANOL  HEMOGLOBIN A1C  I-STAT CHEM 8, ED  CBG MONITORING, ED    EKG: None  Radiology: CT HEAD CODE STROKE WO CONTRAST Result Date: 07/11/2023 CLINICAL DATA:  Code stroke.  Neuro deficit, concern for stroke. EXAM: CT HEAD WITHOUT CONTRAST TECHNIQUE: Contiguous axial images were obtained from the base of the skull through the vertex without intravenous contrast. RADIATION DOSE  REDUCTION: This exam was performed according to the departmental dose-optimization program which includes automated exposure control, adjustment of the mA and/or kV according to patient size and/or use of iterative reconstruction technique. COMPARISON:  MRI head 07/03/2017. FINDINGS: Brain: No acute intracranial hemorrhage. No CT evidence of acute infarct. No edema, mass effect, or midline shift. The basilar cisterns are patent. Ventricles: The ventricles are normal. Vascular: Atherosclerotic calcifications of the carotid siphons. No hyperdense vessel. Skull: No acute or aggressive finding. Orbits: Orbits are symmetric. Sinuses: The visualized paranasal sinuses are clear. Other: Mastoid air cells are clear. ASPECTS [facility] Stroke Program Early CT Score) - Ganglionic level infarction (caudate, lentiform nuclei, internal capsule, insula, M1-M3 cortex): 7 - Supraganglionic infarction (M4-M6 cortex): 3 Total score (0-10 with 10 being normal): 10 IMPRESSION: 1. No CT evidence of acute intracranial abnormality. 2. ASPECTS is 10 These results were communicated to Dr. Michaela at 3:12 pm on 07/11/2023 by text page via the [facility] messaging system. Electronically Signed   By: Donnice Mania M.D.   On: 07/11/2023 15:12     .Critical Care  Performed by: Bari Roxie HERO, DO Authorized by: Bari Roxie HERO, DO   Critical care provider statement:    Critical care time (minutes):  30   Critical care time was exclusive of:  Separately billable procedures and treating other patients   Critical care was necessary to treat or prevent imminent or life-threatening deterioration of the following conditions:  CNS failure or compromise   Critical care was time spent personally by me on the following activities:  Development of treatment plan with patient or surrogate, discussions with consultants, evaluation of patient's response to treatment, examination of patient, ordering and review of laboratory studies, ordering and  review of radiographic studies, ordering and performing treatments and interventions, pulse oximetry, re-evaluation of patient's condition and review of old charts   I assumed direction of critical care for this patient from another provider in my specialty: no     Care discussed with: admitting provider      Medications Ordered in the ED  sodium chloride  flush (NS) 0.9 % injection 3 mL (has no administration in time range)  tenecteplase (TNKASE) injection for Stroke 14 mg (has no administration in time range)   stroke: early stages of recovery book (has no administration in time range)  0.9 %  sodium chloride  infusion (has no administration in time range)  acetaminophen  (TYLENOL ) tablet 650 mg (has no administration in time range)    Or  acetaminophen  (TYLENOL ) 160 MG/5ML solution 650 mg (has no administration in time range)    Or  acetaminophen  (TYLENOL ) suppository 650 mg (has no administration in time range)  senna-docusate (Senokot-S) tablet 1 tablet (has no administration in time range)  pantoprazole (PROTONIX) injection 40 mg (has no administration in time range)  labetalol (NORMODYNE) injection 10-20 mg (has no administration in time range)  iohexol (OMNIPAQUE) 350 MG/ML injection 75 mL (75 mLs Intravenous Contrast Given 07/11/23 1505)                                    Medical Decision Making Amount and/or Complexity of Data Reviewed Labs: ordered. Radiology: ordered.  Risk Decision regarding hospitalization.   73 year old female presents emergency department as a code stroke, LVO positive.  This was a witnessed acute onset about an hour and a half prior to arrival.  Evaluated with stroke neurology team at bedside in the CT scanner.  CT of the head shows no acute intracranial abnormality.  Blood pressure slightly elevated, dose labetalol given.  Decision to TNK made by stroke neurology team and pharmacy administered medication.  Patient will be admitted to the stroke  neurology team.  Patients evaluation and results requires admission for further treatment and care.  Spoke with hospitalist, reviewed patient's ED course and they accept admission.  Patient agrees with admission plan, offers no new complaints and is stable/unchanged at time of admit.     Final diagnoses:  Acute CVA (cerebrovascular accident) [facility])    ED Discharge Orders     None          Bari Roxie HERO, DO 07/11/23 1521

## 2023-07-11 NOTE — ED Notes (Signed)
 Pt is hypotensive, MD notified, fluids given.

## 2023-07-11 NOTE — ED Triage Notes (Signed)
 Pt code stroke, last known well 1330

## 2023-07-12 ENCOUNTER — Inpatient Hospital Stay (HOSPITAL_COMMUNITY)

## 2023-07-12 DIAGNOSIS — R29711 NIHSS score 11: Secondary | ICD-10-CM

## 2023-07-12 DIAGNOSIS — I6521 Occlusion and stenosis of right carotid artery: Secondary | ICD-10-CM | POA: Diagnosis not present

## 2023-07-12 DIAGNOSIS — I69391 Dysphagia following cerebral infarction: Secondary | ICD-10-CM | POA: Diagnosis not present

## 2023-07-12 DIAGNOSIS — Z7984 Long term (current) use of oral hypoglycemic drugs: Secondary | ICD-10-CM

## 2023-07-12 DIAGNOSIS — Z794 Long term (current) use of insulin: Secondary | ICD-10-CM

## 2023-07-12 DIAGNOSIS — I6389 Other cerebral infarction: Secondary | ICD-10-CM

## 2023-07-12 DIAGNOSIS — E1165 Type 2 diabetes mellitus with hyperglycemia: Secondary | ICD-10-CM

## 2023-07-12 DIAGNOSIS — F17200 Nicotine dependence, unspecified, uncomplicated: Secondary | ICD-10-CM

## 2023-07-12 DIAGNOSIS — E785 Hyperlipidemia, unspecified: Secondary | ICD-10-CM

## 2023-07-12 LAB — ECHOCARDIOGRAM COMPLETE
Area-P 1/2: 3.77 cm2
Est EF: >75
Height: 63 in
S' Lateral: 2.4 cm
Weight: 1954.16 [oz_av]

## 2023-07-12 LAB — GLUCOSE, CAPILLARY
Glucose-Capillary: 73 mg/dL (ref 70–99)
Glucose-Capillary: 103 mg/dL — ABNORMAL HIGH (ref 70–99)
Glucose-Capillary: 155 mg/dL — ABNORMAL HIGH (ref 70–99)
Glucose-Capillary: 191 mg/dL — ABNORMAL HIGH (ref 70–99)
Glucose-Capillary: 238 mg/dL — ABNORMAL HIGH (ref 70–99)
Glucose-Capillary: 271 mg/dL — ABNORMAL HIGH (ref 70–99)

## 2023-07-12 LAB — CBC
HCT: 38.3 % (ref 36.0–46.0)
Hemoglobin: 12.2 g/dL (ref 12.0–15.0)
MCH: 26.1 pg (ref 26.0–34.0)
MCHC: 31.9 g/dL (ref 30.0–36.0)
MCV: 82 fL (ref 80.0–100.0)
Platelets: 199 K/uL (ref 150–400)
RBC: 4.67 MIL/uL (ref 3.87–5.11)
RDW: 13.7 % (ref 11.5–15.5)
WBC: 10.9 K/uL — ABNORMAL HIGH (ref 4.0–10.5)
nRBC: 0 % (ref 0.0–0.2)

## 2023-07-12 LAB — BASIC METABOLIC PANEL WITH GFR
Anion gap: 13 (ref 5–15)
BUN: 7 mg/dL — ABNORMAL LOW (ref 8–23)
CO2: 21 mmol/L — ABNORMAL LOW (ref 22–32)
Calcium: 9.3 mg/dL (ref 8.9–10.3)
Chloride: 101 mmol/L (ref 98–111)
Creatinine, Ser: 0.63 mg/dL (ref 0.44–1.00)
GFR, Estimated: >60 mL/min (ref >60)
Glucose, Bld: 73 mg/dL (ref 70–99)
Potassium: 3.7 mmol/L (ref 3.5–5.1)
Sodium: 135 mmol/L (ref 135–145)

## 2023-07-12 LAB — LIPID PANEL
Cholesterol: 171 mg/dL (ref 0–200)
HDL: 49 mg/dL (ref >40)
LDL Cholesterol: 110 mg/dL — ABNORMAL HIGH (ref 0–99)
Total CHOL/HDL Ratio: 3.5 ratio
Triglycerides: 58 mg/dL (ref <150)
VLDL: 12 mg/dL (ref 0–40)

## 2023-07-12 LAB — MAGNESIUM: Magnesium: 1.9 mg/dL (ref 1.7–2.4)

## 2023-07-12 LAB — PHOSPHORUS: Phosphorus: 3.8 mg/dL (ref 2.5–4.6)

## 2023-07-12 MED ORDER — CLOPIDOGREL BISULFATE 75 MG PO TABS: 300.0000 mg | ORAL_TABLET | Freq: Once | ORAL | Status: DC

## 2023-07-12 MED ORDER — ATORVASTATIN CALCIUM 40 MG PO TABS
40.0000 mg | ORAL_TABLET | Freq: Every day | ORAL | Status: DC
  Filled 2023-07-12: qty 1

## 2023-07-12 MED ORDER — CLOPIDOGREL BISULFATE 75 MG PO TABS
75.0000 mg | ORAL_TABLET | Freq: Every day | ORAL | Status: DC
Start: 2023-07-13 — End: 2023-07-12

## 2023-07-12 MED ORDER — PROSOURCE TF20 ENFIT COMPATIBL EN LIQD
60.0000 mL | Freq: Every day | ENTERAL | Status: DC
  Administered 2023-07-12: 60 mL
  Filled 2023-07-12: qty 60

## 2023-07-12 MED ORDER — CLOPIDOGREL BISULFATE 75 MG PO TABS
300.0000 mg | ORAL_TABLET | Freq: Once | ORAL | Status: AC
  Administered 2023-07-12: 300 mg
  Filled 2023-07-12: qty 4

## 2023-07-12 MED ORDER — ASPIRIN 81 MG PO CHEW
81.0000 mg | CHEWABLE_TABLET | Freq: Every day | ORAL | Status: DC
  Administered 2023-07-13 – 2023-07-15 (×3): 81 mg
  Filled 2023-07-12 (×3): qty 1

## 2023-07-12 MED ORDER — ATORVASTATIN CALCIUM 40 MG PO TABS
40.0000 mg | ORAL_TABLET | Freq: Every day | ORAL | Status: DC
  Administered 2023-07-12 – 2023-07-15 (×4): 40 mg
  Filled 2023-07-12 (×3): qty 1

## 2023-07-12 MED ORDER — ATORVASTATIN CALCIUM 40 MG PO TABS: 40.0000 mg | ORAL_TABLET | Freq: Every day | ORAL | Status: DC

## 2023-07-12 MED ORDER — VITAL HP 1.0 CAL PO LIQD
1000.0000 mL | ORAL | Status: DC
  Administered 2023-07-12: 1000 mL

## 2023-07-12 MED ORDER — ASPIRIN 81 MG PO CHEW
81.0000 mg | CHEWABLE_TABLET | Freq: Every day | ORAL | Status: DC
Start: 2023-07-12 — End: 2023-07-12
  Administered 2023-07-12: 81 mg via ORAL
  Filled 2023-07-12: qty 1

## 2023-07-12 MED ORDER — CLOPIDOGREL BISULFATE 75 MG PO TABS
75.0000 mg | ORAL_TABLET | Freq: Every day | ORAL | Status: DC
  Administered 2023-07-13 – 2023-07-15 (×3): 75 mg
  Filled 2023-07-12 (×3): qty 1

## 2023-07-12 NOTE — Progress Notes (Addendum)
 STROKE TEAM PROGRESS NOTE    SIGNIFICANT HOSPITAL EVENTS 7/3: Patient admitted with acute left-sided weakness, left facial droop and right gaze preference, given TNK to treat stroke.  Patient also found to have right ICA occlusion, but family declined revascularization  INTERIM HISTORY/SUBJECTIVE Patient denies any prior known history of stroke.  She continues to have left-sided weakness Patient has remained afebrile but has required phenylephrine  to maintain systolic blood pressure over 110.  Plan to keep her hydrated and keep blood pressure elevated in the setting of right carotid occlusion  OBJECTIVE  CBC    Component Value Date/Time   WBC 10.9 (H) 07/12/2023 0512   RBC 4.67 07/12/2023 0512   HGB 12.2 07/12/2023 0512   HCT 38.3 07/12/2023 0512   PLT 199 07/12/2023 0512   MCV 82.0 07/12/2023 0512   MCH 26.1 07/12/2023 0512   MCHC 31.9 07/12/2023 0512   RDW 13.7 07/12/2023 0512   LYMPHSABS 2.5 07/11/2023 1458   MONOABS 0.8 07/11/2023 1458   EOSABS 0.0 07/11/2023 1458   BASOSABS 0.1 07/11/2023 1458    BMET    Component Value Date/Time   NA 135 07/12/2023 0512   K 3.7 07/12/2023 0512   CL 101 07/12/2023 0512   CO2 21 (L) 07/12/2023 0512   GLUCOSE 73 07/12/2023 0512   BUN 7 (L) 07/12/2023 0512   CREATININE 0.63 07/12/2023 0512   CREATININE 0.69 07/17/2022 1354   CALCIUM  9.3 07/12/2023 0512   EGFR 93 07/17/2022 1354   GFRNONAA >60 07/12/2023 0512   GFRNONAA 79 01/18/2020 0858    IMAGING past 24 hours ECHOCARDIOGRAM COMPLETE Result Date: 07/12/2023    ECHOCARDIOGRAM REPORT   Patient Name:   Mom[place] A Manthe Date of Exam: 07/12/2023 Medical Rec #:  [id number]          Height:       63.0 in Accession #:    [id number]         Weight:       122.1 lb Date of Birth:  11/23/1950         BSA:          1.568 m Patient Age:    72 years           BP:           137/78 mmHg Patient Gender: F                  HR:           83 bpm. Exam Location:  Inpatient Procedure: 2D Echo, Cardiac  Doppler and Color Doppler (Both Spectral and Color            Flow Doppler were utilized during procedure). Indications:    Stroke  History:        Patient has no prior history of Echocardiogram examinations.                 COPD; Risk Factors:Hypertension, Dyslipidemia and Diabetes.  Sonographer:    Philomena Daring Referring Phys: Anius.Banister MCNEILL P KIRKPATRICK IMPRESSIONS  1. Left ventricular ejection fraction, by estimation, is >75%. The left ventricle has hyperdynamic function. The left ventricle has no regional wall motion abnormalities. Left ventricular diastolic parameters were normal.  2. Right ventricular systolic function is normal. The right ventricular size is normal. Moderately increased right ventricular wall thickness. There is normal pulmonary artery systolic pressure. The estimated right ventricular systolic pressure is 28.0 mmHg.  3. Findings are consistent with cardiac tamponade.  4. The  mitral valve is degenerative. No evidence of mitral valve regurgitation. No evidence of mitral stenosis.  5. The aortic valve is normal in structure. Aortic valve regurgitation is not visualized. No aortic stenosis is present.  6. The inferior vena cava is normal in size with greater than 50% respiratory variability, suggesting right atrial pressure of 3 mmHg.  7. Subcostal images were suboptimal but Agitated saline contrast bubble study was negative, with no evidence of any interatrial shunt. Clinical correlation required. Comparison(s): No prior Echocardiogram. Conclusion(s)/Recommendation(s): No intracardiac source of embolism detected on this transthoracic study. Consider a transesophageal echocardiogram to exclude cardiac source of embolism if clinically indicated. FINDINGS  Left Ventricle: Left ventricular ejection fraction, by estimation, is >75%. The left ventricle has hyperdynamic function. The left ventricle has no regional wall motion abnormalities. The left ventricular internal cavity size was small. There is  no left  ventricular hypertrophy. Left ventricular diastolic parameters were normal. Right Ventricle: The right ventricular size is normal. Moderately increased right ventricular wall thickness. Right ventricular systolic function is normal. There is normal pulmonary artery systolic pressure. The tricuspid regurgitant velocity is 2.50 m/s, and with an assumed right atrial pressure of 3 mmHg, the estimated right ventricular systolic pressure is 28.0 mmHg. Left Atrium: Left atrial size was normal in size. Right Atrium: Right atrial size was normal in size. Pericardium: Trivial pericardial effusion is present. There is evidence of cardiac tamponade. Presence of epicardial fat layer. Mitral Valve: The mitral valve is degenerative in appearance. There is mild thickening of the mitral valve leaflet(s). Normal mobility of the mitral valve leaflets. Mild mitral annular calcification. No evidence of mitral valve regurgitation. No evidence  of mitral valve stenosis. Tricuspid Valve: The tricuspid valve is normal in structure. Tricuspid valve regurgitation is mild . No evidence of tricuspid stenosis. Aortic Valve: The aortic valve is normal in structure. Aortic valve regurgitation is not visualized. No aortic stenosis is present. Pulmonic Valve: The pulmonic valve was not well visualized. Pulmonic valve regurgitation is not visualized. Aorta: The aortic root was not well visualized. Venous: The inferior vena cava is normal in size with greater than 50% respiratory variability, suggesting right atrial pressure of 3 mmHg. IAS/Shunts: The interatrial septum was not well visualized. Agitated saline contrast was given intravenously to evaluate for intracardiac shunting. Subcostal images were suboptimal but Agitated saline contrast bubble study was negative, with no evidence of any interatrial shunt. Clinical correlation required.  LEFT VENTRICLE PLAX 2D LVIDd:         3.60 cm   Diastology LVIDs:         2.40 cm   LV e' medial:     7.18 cm/s LV PW:         0.90 cm   LV E/e' medial:  11.2 LV IVS:        0.90 cm   LV e' lateral:   11.40 cm/s LVOT diam:     2.00 cm   LV E/e' lateral: 7.1 LV SV:         63 LV SV Index:   40 LVOT Area:     3.14 cm  RIGHT VENTRICLE             IVC RV S prime:     14.50 cm/s  IVC diam: 1.40 cm TAPSE (M-mode): 1.7 cm LEFT ATRIUM             Index        RIGHT ATRIUM  Index LA diam:        2.50 cm 1.59 cm/m   RA Area:     9.17 cm LA Vol (A2C):   16.2 ml 10.33 ml/m  RA Volume:   17.30 ml 11.03 ml/m LA Vol (A4C):   31.8 ml 20.28 ml/m LA Biplane Vol: 22.7 ml 14.48 ml/m  AORTIC VALVE LVOT Vmax:   106.00 cm/s LVOT Vmean:  70.200 cm/s LVOT VTI:    0.199 m MITRAL VALVE               TRICUSPID VALVE MV Area (PHT): 3.77 cm    TR Peak grad:   25.0 mmHg MV Decel Time: 201 msec    TR Vmax:        250.00 cm/s MV E velocity: 80.70 cm/s MV A velocity: 90.70 cm/s  SHUNTS MV E/A ratio:  0.89        Systemic VTI:  0.20 m                            Systemic Diam: 2.00 cm Sunit Tolia Electronically signed by Madonna Large Signature Date/Time: 07/12/2023/11:15:38 AM    Final    CT ANGIO HEAD NECK W WO CM (CODE STROKE) Result Date: 07/11/2023 CLINICAL DATA:  Code stroke, neuro deficit. Left-sided gaze and left-sided weakness. EXAM: CT ANGIOGRAPHY HEAD AND NECK WITH AND WITHOUT CONTRAST TECHNIQUE: Multidetector CT imaging of the head and neck was performed using the standard protocol during bolus administration of intravenous contrast. Multiplanar CT image reconstructions and MIPs were obtained to evaluate the vascular anatomy. Carotid stenosis measurements (when applicable) are obtained utilizing NASCET criteria, using the distal internal carotid diameter as the denominator. RADIATION DOSE REDUCTION: This exam was performed according to the departmental dose-optimization program which includes automated exposure control, adjustment of the mA and/or kV according to patient size and/or use of iterative reconstruction technique.  CONTRAST:  75mL OMNIPAQUE  IOHEXOL  350 MG/ML SOLN COMPARISON:  Same-day head CT.  MRI head 07/03/2017. FINDINGS: CTA NECK FINDINGS Aortic arch: Standard configuration of the aortic arch. Imaged portion shows no evidence of aneurysm or dissection. Mild-to-moderate atherosclerosis of the visualized aortic arch. No significant stenosis of the major arch vessel origins. Pulmonary arteries: As permitted by contrast timing, there are no filling defects in the visualized pulmonary arteries. Subclavian arteries: Patent bilaterally. Atherosclerosis at the origin of the left subclavian artery. There is additional noncalcified atherosclerosis along the proximal aspect of the vessel resulting in moderate to severe stenosis. Right carotid system: The common carotid artery is patent. External carotid artery branches are patent. There is abrupt cut off of the proximal cervical ICA approximately 5 mm distal to the bifurcation. The vessel remains occluded to the intracranial segment. Left carotid system: Patent from the origin to the skull base. Carotid bifurcation is patent. External carotid artery branches are patent. No high-grade stenosis of the internal carotid artery. Vertebral arteries: Right vertebral artery is dominant and is patent from the origin to the vertebrobasilar confluence. Atherosclerosis at the right vertebral artery origin without significant stenosis. Atherosclerosis at the origin of the non dominant left vertebral artery resulting in severe stenosis. There is multifocal irregularity and stenosis of the left V2 segment. Diminished caliber of the intracranial left vertebral artery after the origin of the left PICA. There is reconstitution of the distal left V4 segment just proximal to the vertebrobasilar confluence likely filled via retrograde flow. Skeleton: No acute findings. Degenerative changes in the cervical spine.  Disc space narrowing most pronounced at C6-7. Edentulous maxilla and mandible. Other neck:  The visualized airway is patent. No cervical lymphadenopathy. Upper chest: Visualized lung apices are clear. Review of the MIP images confirms the above findings CTA HEAD FINDINGS ANTERIOR CIRCULATION: The left internal carotid artery is patent from the cervical segment to the ICA terminus. Atherosclerosis along the carotid siphon resulting in mild-to-moderate stenosis of the anterior genu of the cavernous segment and paraclinoid segment. The right ICA is occluded from the cervical segment to the distal cavernous/paraclinoid segment. Additional atherosclerosis noted within the right carotid siphon. MCAs: The right M1 segment is patent. The MCA bifurcation is patent on the right. There is diminished caliber of right MCA branches compared to the left. Overall diminished number of second and third order branches of the right MCA compared to the left ACAs: Patent bilaterally. 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 Similar to the right MCA there is decreased caliber of distal right ACA branches compared to the left ACA. POSTERIOR CIRCULATION: No significant stenosis, proximal occlusion, aneurysm, or vascular malformation. PCAs: Patent bilaterally. Moderate stenosis of the P2 a segment of the right PCA. Additional mild stenosis of the P2 a segment left PCA. Pcomm: The posterior communicating arteries are visualized bilaterally. SCAs: Patent bilaterally. Moderate stenosis at the origin of the left SCA. Basilar artery: Patent AICAs: Patent PICAs: Patent Vertebral arteries: As above. Venous sinuses: As permitted by contrast timing, patent. 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 Reconstitution at the paraclinoid segment likely via the circle-of-Willis. There is decreased caliber of right MCA branches with fewer  second and third order branches of the right MCA compared to the left. Similarly there are decreased caliber and decreased number of right ACA branches compared to the left. There is no focal occlusion identified within the right MCA or right ACA. Suspect findings are related to right ICA occlusion. 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 Multifocal stenosis and irregularity of the left V2 segment. 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 Moderate stenosis of the P2 segment right PCA. Moderate stenosis at the origin of the left SCA. Prominent noncalcified atherosclerotic plaque in the proximal left subclavian artery resulting in moderate to severe stenosis. Aortic Atherosclerosis (ICD10-I70.0). Findings of right ICA occlusion and diminished caliber/branching of the right MCA were communicated to Dr. Michaela at 3:31 pm on 07/11/2023 by text page via the [facility] messaging system. Electronically Signed   By: Donnice Mania M.D.   On: 07/11/2023 15:54   CT HEAD CODE STROKE WO CONTRAST Result Date: 07/11/2023 CLINICAL DATA:  Code stroke.  Neuro deficit, concern for stroke. EXAM: CT HEAD WITHOUT CONTRAST TECHNIQUE: Contiguous axial images were obtained from the base of the skull through the vertex without intravenous contrast. RADIATION DOSE REDUCTION: This exam was performed according to the departmental dose-optimization program which includes automated exposure control, adjustment of the mA and/or kV according to patient size and/or use of iterative reconstruction technique. COMPARISON:  MRI head 07/03/2017. FINDINGS: Brain: No acute intracranial hemorrhage. No CT evidence of acute infarct. No edema, mass effect, or midline shift. The basilar cisterns are patent. Ventricles: The ventricles are normal. Vascular: Atherosclerotic calcifications  of the carotid siphons.  No hyperdense vessel. Skull: No acute or aggressive finding. Orbits: Orbits are symmetric. Sinuses: The visualized paranasal sinuses are clear. Other: Mastoid air cells are clear. ASPECTS [facility] Stroke Program Early CT Score) - Ganglionic level infarction (caudate, lentiform nuclei, internal capsule, insula, M1-M3 cortex): 7 - Supraganglionic infarction (M4-M6 cortex): 3 Total score (0-10 with 10 being normal): 10 IMPRESSION: 1. No CT evidence of acute intracranial abnormality. 2. ASPECTS is 10 These results were communicated to Dr. Michaela at 3:12 pm on 07/11/2023 by text page via the [facility] messaging system. Electronically Signed   By: Donnice Mania M.D.   On: 07/11/2023 15:12    Vitals:   07/12/23 1100 07/12/23 1130 07/12/23 1151 07/12/23 1200  BP: 125/65 126/70  129/66  Pulse: 86 76  80  Resp: 19 14  18   Temp:   98.8 F (37.1 C)   TempSrc:   Axillary   SpO2: 96% 100%  96%  Weight:      Height:         PHYSICAL EXAM General:  Alert, well-nourished, well-developed elderly patient in no acute distress Psych:  Mood and affect appropriate for situation CV: Regular rate and rhythm on monitor Respiratory:  Regular, unlabored respirations on room air  NEURO:  Mental Status: Alert and oriented to person and place, gives the right month but the wrong age Speech/Language: speech is with mild dysarthria but no aphasia  Naming, repetition, fluency, and comprehension intact.  Cranial Nerves:  II: PERRL. Visual fields full.  III, IV, VI: Right gaze preference but able to cross midline V: Sensation is intact to light touch and symmetrical to face.  VII: Left facial droop VIII: hearing intact to voice. IX, X: Phonation is normal.  XII: tongue is midline without fasciculations. Motor: Able to move right upper and lower extremities with good antigravity strength, no movement of left upper and lower extremities Tone: is normal and bulk is normal Sensation- Intact to light touch bilaterally.   Coordination: FTN intact on the right, unable to perform on the left Gait- deferred  Most Recent NIH  1a Level of Conscious.: 0 1b LOC Questions: 1 1c LOC Commands: 0 2 Best Gaze: 1 3 Visual: 0 4 Facial Palsy: 0 5a Motor Arm - left: 4 5b Motor Arm - Right: 0 6a Motor Leg - Left: 4 6b Motor Leg - Right: 0 7 Limb Ataxia: 0 8 Sensory: 0 9 Best Language: 0 10 Dysarthria: 1 11 Extinct. and Inatten.: 0 TOTAL: 11   ASSESSMENT/PLAN  Ms. SENYA HINZMAN is a 73 y.o. female with history of diabetes, hypertension, hyperlipidemia and COPD admitted for acute onset left-sided weakness, left facial droop and right gaze preference.  Patient was given TNK to treat her stroke, and was found to have a right internal carotid occlusion.  Family declined interventional radiology given her baseline functional status.  NIH on Admission 19  Acute Ischemic Infarct:  right sided stroke s/p TNK Etiology: Right ICA occlusion Code Stroke CT head No acute abnormality. ASPECTS 10.    CTA head & neck occlusion of the right ICA from proximal cervical segment to cavernous segment with reconstitution of paraclinoid segment likely via circle of Willis, decreased caliber and number of right MCA branches, decreased caliber in number of right ACA branches, severe stenosis at the origin of the left nondominant vertebral artery, multifocal stenosis and irregularity of the left V2 segment, likely chronic occlusion of left V4 segment after the origin of the left  PICA, moderate stenosis of right PCA P2 segment, moderate stenosis of the origin of the left ICA MRI pending 2D Echo EF greater than 75%, degenerative mitral valve, negative agitated saline bubble study, normal left atrial size LDL 110 HgbA1c 12.3 VTE prophylaxis -SCDs No antithrombotic prior to admission, now on No antithrombotic as she is less than 24 hours from TNK administration Therapy recommendations:  Pending Disposition: Pending  Right ICA  occlusion Occlusion of right ICA from proximal cervical segment cavernous segment with reconstitution at the paraclinoid segment noted on CT angiogram Family declined interventions for this given baseline functional status  Hypertension Home meds: None Unstable, requiring phenylephrine  Keep systolic blood pressure greater than 110  Hyperlipidemia Home meds: Atorvastatin  20 mg daily, increased to 40 LDL 110, goal < 70 Continue statin at discharge  Diabetes type II poorly controlled Home meds: Insulin  70/30 6 units twice daily, metformin  1000 mg twice daily HgbA1c 12.3, goal < 7.0 CBGs SSI Recommend close follow-up with PCP for better DM control  Tobacco Abuse Patient smokes cigarettes once a week and uses chewing tobacco Will discuss tobacco cessation with patient  Dysphagia Patient has post-stroke dysphagia, SLP consulted    Diet   Diet NPO time specified   Advance diet as tolerated  Other Stroke Risk Factors Advanced age   Other Active Problems None  Hospital day # 1  Patient seen by NP with MD, MD to edit note as needed. Cortney E Everitt Clint Kill , MSN, AGACNP-BC [facility] See Amion for schedule and pager information 07/12/2023 1:05 PM  I have personally obtained history,examined this patient, reviewed notes, independently viewed imaging studies, participated in medical decision making and plan of care.ROS completed by me personally and pertinent positives fully documented  I have made any additions or clarifications directly to the above note. Agree with note above.  73 year old African-American lady presented with left hemiparesis due to right carotid occlusion received IV TNK but was not felt to be a candidate for thrombectomy given poor baseline functioning and risk-benefit felt not to be in failure of revascularization.  Continue mild permissive hypertension keep systolic greater than 110.  Check MRI scan of the brain.  Continue close neurological  monitoring and strict blood pressure control as per the post TNK protocol.  No family available at the bedside for discussion.This patient is critically ill and at significant risk of neurological worsening, death and care requires constant monitoring of vital signs, hemodynamics,respiratory and cardiac monitoring, extensive review of multiple databases, frequent neurological assessment, discussion with family, other specialists and medical decision making of high complexity.I have made any additions or clarifications directly to the above note.This critical care time does not reflect procedure time, or teaching time or supervisory time of PA/NP/Med Resident etc but could involve care discussion time.  I spent 30 minutes of neurocritical care time  in the care of  this patient.      Eather Popp, MD Medical Director [facility] Pager: [phone] 07/12/2023 3:05 PM   To contact Stroke Continuity provider, please refer to [email or web address]. After hours, contact General Neurology

## 2023-07-12 NOTE — Progress Notes (Addendum)
 OT Cancellation Note  Patient Details Name: Kathryn Mcguire MRN: [id number] DOB: 05/30/1950   Cancelled Treatment:    Reason Eval/Treat Not Completed: Medical issues which prohibited therapy.  Bed rest order in place.  Primary RN advising limited eval this date, no OOB and possibly EOB only from MD.  PT scheduled to see patient, so OT will plan for eval next date, hoping for increased activity level.    Ahnyla Mendel D Dionel Archey 07/12/2023, 12:36 PM 07/12/2023  RP, OTR/L  Acute Rehabilitation Services  Office:  [phone]

## 2023-07-12 NOTE — Progress Notes (Signed)
 PT Cancellation Note  Patient Details Name: Kathryn Mcguire MRN: [id number] DOB: [birth year]   Cancelled Treatment:    Reason Eval/Treat Not Completed: Active bedrest order   Tamirra Sienkiewicz S, PT DPT Acute Rehabilitation Services Secure Chat Preferred  Office [phone]   Mylia Pondexter E Stroup 07/12/2023, 1:20 PM

## 2023-07-12 NOTE — Evaluation (Addendum)
 Clinical/Bedside Swallow Evaluation Patient Details  Name: Kathryn Mcguire MRN: [id number] Date of Birth: Aug 21, 1950  Today's Date: 07/12/2023 Time: SLP Start Time (ACUTE ONLY): 9175 SLP Stop Time (ACUTE ONLY): 0835 SLP Time Calculation (min) (ACUTE ONLY): 11 min  Past Medical History:  Past Medical History:  Diagnosis Date   Arthritis    Bipolar depression (HCC)    COPD (chronic obstructive pulmonary disease) (HCC)    Dementia (HCC)    Diabetes mellitus without complication (HCC)    Elevated LFTs    Emphysema of lung (HCC)    GERD (gastroesophageal reflux disease)    History of adenomatous polyps of colon 10/15/2013   Hyperlipemia    Hypertension    UTI (lower urinary tract infection)    Past Surgical History:  Past Surgical History:  Procedure Laterality Date   COLONOSCOPY     ESOPHAGOGASTRODUODENOSCOPY     TONSILLECTOMY     TONSILLECTOMY AND ADENOIDECTOMY     TUBAL LIGATION     HPI:  73 y.o. female presented 7/3 with L sided weakness, L facial droop, and R gaze preference s/p TNK. CTA revealed R ICA occlusion. PMH includes: bipolar, dementia, DM, HTN, HLD, COPD.    Assessment / Plan / Recommendation  Clinical Impression  Pt is sleepy but awakens well with stimulation. She has a L facial droop and although her tongue protrudes at midline, she does not bring it to her L side when asked to lateralize. Symptoms of oral dysphagia includes mild L buccal pocketing with purees, L anterior loss most prominent with thin liquids, and variable oral transit time, ranging from 1 to 15+ seconds from time of bolus acceptance to palpation of hyoid movement suggestive of swallow. There is also coughing on the majority of thin liquid trials, even when offered by small spoonfuls, concerning for reduced airway protection. Pt is not ready for PO diet yet, but could try essential meds crushed in puree. SLP will f/u for readiness to begin diet vs need for instrumental testing.   SLP Visit  Diagnosis: Dysphagia, unspecified (R13.10)    Aspiration Risk       Diet Recommendation NPO except meds    Medication Administration: Crushed with puree    Other  Recommendations Oral Care Recommendations: Oral care QID Caregiver Recommendations: Have oral suction available     Assistance Recommended at Discharge    Functional Status Assessment Patient has had a recent decline in their functional status and demonstrates the ability to make significant improvements in function in a reasonable and predictable amount of time.  Frequency and Duration min 2x/week  2 weeks       Prognosis Prognosis for improved oropharyngeal function: Good Barriers to Reach Goals: Cognitive deficits      Swallow Study   General HPI: 73 y.o. female presented 7/3 with L sided weakness, L facial droop, and R gaze preference s/p TNK. CTA revealed R ICA occlusion. PMH includes: bipolar, dementia, DM, HTN, HLD, COPD. Pt Type of Study: Bedside Swallow Evaluation Previous Swallow Assessment: none in chart Diet Prior to this Study: NPO Temperature Spikes Noted: No Respiratory Status: Room air History of Recent Intubation: No Behavior/Cognition: Alert;Cooperative;Requires cueing Oral Cavity Assessment: Within Functional Limits Oral Care Completed by SLP: No Oral Cavity - Dentition: Edentulous Self-Feeding Abilities: Total assist Patient Positioning: Upright in bed Baseline Vocal Quality: Normal Volitional Cough: Weak Volitional Swallow: Unable to elicit    Oral/Motor/Sensory Function Overall Oral Motor/Sensory Function: Mild impairment Facial ROM: Reduced left;Suspected CN VII (facial) dysfunction Facial  Symmetry: Abnormal symmetry left;Suspected CN VII (facial) dysfunction Lingual ROM: Reduced left;Suspected CN XII (hypoglossal) dysfunction Lingual Symmetry: Abnormal symmetry left (protrudes at midline but she does not move it to the L when asked to lateralize) Velum:  (difficult to visualize)   Ice  Chips Ice chips: Not tested   Thin Liquid Thin Liquid: Impaired Presentation: Cup;Spoon;Straw;Self Fed Oral Phase Impairments: Reduced labial seal;Poor awareness of bolus Oral Phase Functional Implications: Left anterior spillage Pharyngeal  Phase Impairments: Cough - Immediate    Nectar Thick Nectar Thick Liquid: Not tested   Honey Thick Honey Thick Liquid: Not tested   Puree Puree: Impaired Presentation: Spoon Oral Phase Impairments: Reduced labial seal;Poor awareness of bolus Oral Phase Functional Implications: Left anterior spillage;Prolonged oral transit;Left lateral sulci pocketing   Solid     Solid: Not tested      Leita SAILOR., M.A. CCC-SLP Acute Rehabilitation Services Office: [phone]  Secure chat preferred  07/12/2023,8:57 AM

## 2023-07-12 NOTE — TOC CAGE-AID Note (Signed)
 Transition of Care [facility]) - CAGE-AID Screening   Patient Details  Name: Kathryn Mcguire MRN: [id number] Date of Birth: [birth year]  [facility] of [facility]) CM/SW Contact:    Alyanna Stoermer E Tylisa Alcivar, LCSW Phone Number: 07/12/2023, 10:05 AM   Clinical Narrative:    CAGE-AID Screening: Substance Abuse Screening unable to be completed due to: : Patient unable to participate

## 2023-07-12 NOTE — Evaluation (Signed)
 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   Xanthelasma of eyelid, bilateral 01/13/2019   Callus 01/13/2019   Chronic left-sided low back pain without sciatica 01/13/2019   Tobacco abuse disorder 01/13/2019   Essential hypertension 02/04/2017   Self neglect 02/04/2017   Encounter for psychiatric assessment 12/30/2016   Hypertension 12/21/2016   Type 2 diabetes mellitus without complications (HCC) 12/21/2016   Patient's other noncompliance with medication regimen 06/08/2014   Bipolar 1 disorder, depressed (HCC) 01/18/2014   Hyperglycemia    Noncompliance with medication regimen    Psychiatric illness    Long-term insulin  use (HCC) 01/07/2014   Vitamin D  deficiency 01/06/2014   History of adenomatous polyps of colon 10/15/2013   Abnormal LFTs 08/25/2013   Transaminitis 06/17/2013   Depression 06/15/2013   Severe bipolar I disorder, most recent episode mixed, with psychotic features, mood-incongruent (HCC) 06/15/2013   Dyslipidemia 04/20/2013   GERD (gastroesophageal reflux disease) 04/20/2013   Osteoporosis 04/20/2013   COPD (chronic obstructive pulmonary disease) (HCC) 06/16/2012   S/P colonoscopic polypectomy 06/16/2012   Past Medical History:  Past Medical History:  Diagnosis Date   Arthritis    Bipolar depression (HCC)    COPD (chronic obstructive pulmonary disease) (HCC)    Dementia (HCC)    Diabetes mellitus without complication (HCC)    Elevated LFTs    Emphysema of lung (HCC)    GERD (gastroesophageal reflux disease)    History of adenomatous polyps of colon 10/15/2013   Hyperlipemia    Hypertension    UTI (lower urinary tract infection)    Past Surgical History:  Past Surgical History:  Procedure  Laterality Date   COLONOSCOPY     ESOPHAGOGASTRODUODENOSCOPY     TONSILLECTOMY     TONSILLECTOMY AND ADENOIDECTOMY     TUBAL LIGATION     HPI:  73 y.o. female presented 7/3 with L sided weakness, L facial droop, and R gaze preference s/p TNK. CTA revealed R ICA occlusion. PMH includes: bipolar, dementia, DM, HTN, HLD, COPD.   Assessment / Plan / Recommendation Clinical Impression  Pt presents with cognitive and communicative deficits that are suspected to be a decline from baseline level of function, although noted that she does require assistance at baseline. She now is oriented to person and needs Min-Mod cues to follow one-step commands. Speech is mildly dysarthric although verbal output overall is somewhat limited for assessment. She is inattentive to her L, but with cueing will interact with her L side. She will benefit from ongoing cognitive and communicative therapy acutely and at next level of care.      SLP Assessment  SLP Recommendation/Assessment: Patient needs continued Speech Language Pathology Services SLP Visit Diagnosis: Cognitive communication deficit (R41.841)     Assistance Recommended at Discharge  Frequent or constant Supervision/Assistance  Functional Status Assessment Patient has had a recent decline in their functional status and demonstrates the ability to make significant improvements in function in a reasonable and predictable amount of time.  Frequency and Duration min 2x/week  2 weeks      SLP Evaluation Cognition  Overall Cognitive Status: No family/caregiver present to determine baseline cognitive functioning Arousal/Alertness: Awake/alert Orientation Level: Oriented to person;Disoriented to place Attention: Sustained Sustained Attention: Impaired Sustained Attention Impairment: Verbal basic Memory: Impaired Memory Impairment:  Decreased recall of new information Awareness: Impaired Awareness Impairment: Other (comment) (online) Comments: L  inattention       Comprehension  Auditory Comprehension Overall Auditory Comprehension: Impaired Commands: Impaired One Step Basic Commands: 75-100% accurate    Expression Expression Primary Mode of Expression: Verbal Verbal Expression Overall Verbal Expression: Other (comment) (limited output, but appropriate)   Oral / Motor  Oral Motor/Sensory Function Overall Oral Motor/Sensory Function: Mild impairment Facial ROM: Reduced left;Suspected CN VII (facial) dysfunction Facial Symmetry: Abnormal symmetry left;Suspected CN VII (facial) dysfunction Lingual ROM: Reduced left;Suspected CN XII (hypoglossal) dysfunction Lingual Symmetry: Abnormal symmetry left (protrudes at midline but she does not move it to the L when asked to lateralize) Velum:  (difficult to visualize) Motor Speech Overall Motor Speech: Impaired Respiration: Within functional limits Phonation: Normal Resonance: Within functional limits Articulation: Impaired Level of Impairment: Phrase Intelligibility: Intelligibility reduced Phrase: 75-100% accurate            Leita SAILOR., M.A. CCC-SLP Acute Rehabilitation Services Office: [phone]  Secure chat preferred  07/12/2023, 10:11 AM

## 2023-07-13 ENCOUNTER — Inpatient Hospital Stay (HOSPITAL_COMMUNITY)

## 2023-07-13 DIAGNOSIS — I63511 Cerebral infarction due to unspecified occlusion or stenosis of right middle cerebral artery: Secondary | ICD-10-CM | POA: Diagnosis not present

## 2023-07-13 DIAGNOSIS — I6521 Occlusion and stenosis of right carotid artery: Secondary | ICD-10-CM | POA: Diagnosis not present

## 2023-07-13 DIAGNOSIS — I63523 Cerebral infarction due to unspecified occlusion or stenosis of bilateral anterior cerebral arteries: Secondary | ICD-10-CM

## 2023-07-13 DIAGNOSIS — R29711 NIHSS score 11: Secondary | ICD-10-CM | POA: Diagnosis not present

## 2023-07-13 DIAGNOSIS — I779 Disorder of arteries and arterioles, unspecified: Secondary | ICD-10-CM

## 2023-07-13 DIAGNOSIS — I959 Hypotension, unspecified: Secondary | ICD-10-CM

## 2023-07-13 LAB — URINALYSIS, ROUTINE W REFLEX MICROSCOPIC
Bacteria, UA: NONE SEEN
Bilirubin Urine: NEGATIVE
Glucose, UA: >=500 mg/dL — AB
Hgb urine dipstick: NEGATIVE
Ketones, ur: 20 mg/dL — AB
Leukocytes,Ua: NEGATIVE
Nitrite: NEGATIVE
Protein, ur: NEGATIVE mg/dL
Specific Gravity, Urine: 1.025 (ref 1.005–1.030)
pH: 5 (ref 5.0–8.0)

## 2023-07-13 LAB — CBC
HCT: 37.3 % (ref 36.0–46.0)
Hemoglobin: 12.1 g/dL (ref 12.0–15.0)
MCH: 25.9 pg — ABNORMAL LOW (ref 26.0–34.0)
MCHC: 32.4 g/dL (ref 30.0–36.0)
MCV: 79.9 fL — ABNORMAL LOW (ref 80.0–100.0)
Platelets: 207 K/uL (ref 150–400)
RBC: 4.67 MIL/uL (ref 3.87–5.11)
RDW: 13.8 % (ref 11.5–15.5)
WBC: 9.3 K/uL (ref 4.0–10.5)
nRBC: 0 % (ref 0.0–0.2)

## 2023-07-13 LAB — RAPID URINE DRUG SCREEN, HOSP PERFORMED
Amphetamines: NOT DETECTED
Barbiturates: NOT DETECTED
Benzodiazepines: NOT DETECTED
Cocaine: NOT DETECTED
Opiates: NOT DETECTED
Tetrahydrocannabinol: NOT DETECTED

## 2023-07-13 LAB — GLUCOSE, CAPILLARY
Glucose-Capillary: 200 mg/dL — ABNORMAL HIGH (ref 70–99)
Glucose-Capillary: 137 mg/dL — ABNORMAL HIGH (ref 70–99)
Glucose-Capillary: 248 mg/dL — ABNORMAL HIGH (ref 70–99)
Glucose-Capillary: 244 mg/dL — ABNORMAL HIGH (ref 70–99)
Glucose-Capillary: 266 mg/dL — ABNORMAL HIGH (ref 70–99)

## 2023-07-13 LAB — BASIC METABOLIC PANEL WITH GFR
Anion gap: 9 (ref 5–15)
BUN: 19 mg/dL (ref 8–23)
CO2: 25 mmol/L (ref 22–32)
Calcium: 9.2 mg/dL (ref 8.9–10.3)
Chloride: 101 mmol/L (ref 98–111)
Creatinine, Ser: 0.66 mg/dL (ref 0.44–1.00)
GFR, Estimated: >60 mL/min (ref >60)
Glucose, Bld: 200 mg/dL — ABNORMAL HIGH (ref 70–99)
Potassium: 3.9 mmol/L (ref 3.5–5.1)
Sodium: 135 mmol/L (ref 135–145)

## 2023-07-13 LAB — MAGNESIUM: Magnesium: 1.9 mg/dL (ref 1.7–2.4)

## 2023-07-13 LAB — PHOSPHORUS: Phosphorus: 3.4 mg/dL (ref 2.5–4.6)

## 2023-07-13 MED ORDER — FREE WATER
150.0000 mL | Freq: Four times a day (QID) | Status: DC
  Administered 2023-07-13 – 2023-07-15 (×7): 150 mL

## 2023-07-13 MED ORDER — ENOXAPARIN SODIUM 40 MG/0.4ML IJ SOSY
40.0000 mg | PREFILLED_SYRINGE | INTRAMUSCULAR | Status: DC
  Administered 2023-07-13 – 2023-07-15 (×3): 40 mg via SUBCUTANEOUS
  Filled 2023-07-13 (×3): qty 0.4

## 2023-07-13 MED ORDER — OSMOLITE 1.2 CAL PO LIQD: 1000.0000 mL | ORAL | Status: DC

## 2023-07-13 MED ORDER — DONEPEZIL HCL 10 MG PO TABS
5.0000 mg | ORAL_TABLET | Freq: Every day | ORAL | Status: DC
  Administered 2023-07-14 – 2023-07-15 (×3): 5 mg
  Filled 2023-07-13 (×3): qty 1

## 2023-07-13 NOTE — Progress Notes (Signed)
 Initial Nutrition Assessment  DOCUMENTATION CODES:   Not applicable  INTERVENTION:   Cortrak order in place, pending placement on next available service day, Monday 7/7  Tube Feeding via NG:  Change to Osmolite 1.2 at 55 ml/hr Begin at 35 ml/hr, titrate by 10 mL q 8 hours to goal rate of 55 ml/hr TF at goal over 24 hours provides 1584 kcals, 73 g of protein and 1070 mL of free water   Add free water  flush of 150 mL q 6 hours to meet estimated hydration needs while NPO. TF plus free water  flush provides 1670 mL of free water   Currently only on sliding scale insulin  with hx of DM, receiving continuous feedings. Pt will likely require additional insulin  coverage   Electrolytes currently being checked daily through Monday 7/7. MD to supplement if low  NUTRITION DIAGNOSIS:   Inadequate oral intake related to acute illness as evidenced by NPO status.   GOAL:   Patient will meet greater than or equal to 90% of their needs  MONITOR:   Vent status, TF tolerance, Labs, Weight trends  REASON FOR ASSESSMENT:   Consult, Ventilator Enteral/tube feeding initiation and management  ASSESSMENT:   73 yo female admitted as Code Stroke (activated by EMS) with Left sided weakness and facial droop with acute R MCA ischemic infarct. PMH includes dementia, DM, HTN, COPD, bipolar disorder, GERD, bipolar depression. At baseline, pt requires assistance with ADLs. Pt lives with husband, her daughter and grandaughter per H&P.  7/03 Admitted, CT head no acute process, CTA head/neck with R ICA occlusion-family declined revascularization, received TNK 7/04 NG tube placed, TF initiated by MD via Adult TF protocol  NPO, noted plan for MBS by SLP (tentatively tomorrow or Monday 7/7). NG tube in place, tip in stomach per abd xray report on 7/04. Noted Cortrak ordered, pending placement  TF initiated yesterday via Adult TF protocol. Vital High Protein infusing at 40 ml/hr  Noted electrolytes this AM wdl  post initiation of TF yesterday  At baseline, pt needs assistance with ADLs  Current wt 55.4 kg; noted wt of 51.9 kg in April. Unable to obtain nutrition history from patient at this time   Labs: Phosphorus 3.8 (wdl) Magnesium 1.9 (wdl) CBGs 137-271 (goal 140-180)  Meds:  SS Novolog  Plavix   NUTRITION - FOCUSED PHYSICAL EXAM:  Unable to assess  Diet Order:   Diet Order             Diet NPO time specified  Diet effective now                   EDUCATION NEEDS:   Not appropriate for education at this time  Skin:  Skin Assessment: Reviewed RN Assessment  Last BM:  7/05  Height:   Ht Readings from Last 1 Encounters:  07/11/23 5' 3 (1.6 m)    Weight:   Wt Readings from Last 1 Encounters:  07/11/23 55.4 kg    BMI:  Body mass index is 21.64 kg/m.  Estimated Nutritional Needs:   Kcal:  1450-1650 kcals  Protein:  65-75 g  Fluid:  >/= 1.6 L   Betsey Finger MS, RDN, LDN, CNSC Registered Dietitian 3 Clinical Nutrition RD Inpatient Contact Info in [place]

## 2023-07-13 NOTE — Progress Notes (Signed)

## 2023-07-13 NOTE — Evaluation (Signed)
 Occupational Therapy Evaluation Patient Details Name: Kathryn Mcguire MRN: [id number] DOB: [birth year] Today's Date: 07/13/2023   History of Present Illness   73 y.o. female arrives 7/3 to [facility] ED presenting symptoms of left side weakness, decreased sensation, facial droop, gaze preference, and left neglect. NIH=20; CT Scans show suspicions of R ICA occlusion. S/p TNK. Pt has a hx of bipolar, dementia, DM, HTN, HLD, COPD.     Clinical Impressions PTA, pt lived with family who provided cues/reminders for performance of ADL, but pt physically able to perform with supervision. Upon eval, pt presents with L weakness, decreased strength, L attention, sensation, balance, safety, vision, cognition, and communication. Pt needing up to max A for UB and LB ADL and mod A for grooming. Able to perform bed mobility with max A of 1 and SPT to chair with mod A +2. Pt needing min A - CGA for sitting balance due to L lateral lean. Will continue to follow. Due to significant change in functional status and pt with excellent support, recommending intensive multidisciplinary rehabilitation >3 hours/day to optimize safety and independence in ADL.       If plan is discharge home, recommend the following:   Two people to help with walking and/or transfers;Two people to help with bathing/dressing/bathroom;Assistance with cooking/housework;Assist for transportation;Help with stairs or ramp for entrance;Direct supervision/assist for financial management;Direct supervision/assist for medications management;Assistance with feeding     Functional Status Assessment   Patient has had a recent decline in their functional status and demonstrates the ability to make significant improvements in function in a reasonable and predictable amount of time.     Equipment Recommendations   Other (comment) (defer)     Recommendations for Other Services   Rehab consult;Speech consult     Precautions/Restrictions    Precautions Precautions: Fall;Other (comment) (L inattention) Recall of Precautions/Restrictions: Impaired Restrictions Weight Bearing Restrictions Per Provider Order: No     Mobility Bed Mobility Overal bed mobility: Needs Assistance Bed Mobility: Rolling, Sidelying to Sit Rolling: Max assist Sidelying to sit: Max assist       General bed mobility comments: rolled to left; assist for advancing torso throughout    Transfers Overall transfer level: Needs assistance Equipment used: 2 person hand held assist Transfers: Sit to/from Stand, Bed to chair/wheelchair/BSC Sit to Stand: Mod assist, +2 physical assistance     Step pivot transfers: Max assist, +2 physical assistance     General transfer comment: stepping to chair on her left required total assist to advance LLE, however did not buckle in wt-bearing; assist for balance when advancing each LE      Balance Overall balance assessment: Needs assistance Sitting-balance support: No upper extremity supported, Feet supported Sitting balance-Leahy Scale: Poor Sitting balance - Comments: at times total assist with trunk pulling into left lateral flexion with extension; progressed to periods of CGA Postural control: Left lateral lean Standing balance support: Bilateral upper extremity supported Standing balance-Leahy Scale: Poor                             ADL either performed or assessed with clinical judgement   ADL Overall ADL's : Needs assistance/impaired Eating/Feeding: NPO   Grooming: Moderate assistance;Sitting   Upper Body Bathing: Moderate assistance;Sitting   Lower Body Bathing: Maximal assistance;Sit to/from stand;Sitting/lateral leans   Upper Body Dressing : Maximal assistance;Sitting   Lower Body Dressing: Maximal assistance;Bed level (donning socks)   Toilet Transfer: Moderate assistance;+2 for physical  assistance;+2 for safety/equipment;Stand-pivot           Functional mobility during  ADLs: Moderate assistance;+2 for physical assistance;+2 for safety/equipment       Vision Patient Visual Report: No change from baseline Vision Assessment?: Vision impaired- to be further tested in functional context Additional Comments: pt with difficulty tracking toward her L. pt needing multimodal cues to scan ~10 degrees L past midline. R gaze preference noted. decr peripheral vision, but not following commands well enough for visual fields testing     Perception Perception: Impaired Preception Impairment Details: Inattention/Neglect (L)     Praxis         Pertinent Vitals/Pain Pain Assessment Pain Assessment: No/denies pain     Extremity/Trunk Assessment Upper Extremity Assessment Upper Extremity Assessment: LUE deficits/detail LUE Deficits / Details: no active movement noted today, decr ability to identify where she is being touched   Lower Extremity Assessment Lower Extremity Assessment: Defer to PT evaluation   Cervical / Trunk Assessment Cervical / Trunk Assessment: Kyphotic;Other exceptions Cervical / Trunk Exceptions: with effort, trunk pulls into left lateral flexion with some extension   Communication Communication Communication: Impaired Factors Affecting Communication: Reduced clarity of speech   Cognition Arousal: Lethargic Behavior During Therapy: Flat affect Cognition: Cognition impaired   Orientation impairments: Time (aware of situation; able to report she had a CVA) Awareness: Intellectual awareness impaired, Online awareness impaired (unable to identify how she is affected functionally at times even during errors) Memory impairment (select all impairments): Short-term memory, Working memory Attention impairment (select first level of impairment): Focused attention, Sustained attention Executive functioning impairment (select all impairments): Sequencing, Problem solving, Reasoning OT - Cognition Comments: follows basic one step commands on R side                  Following commands: Impaired Following commands impaired: Follows one step commands inconsistently, Follows one step commands with increased time     Cueing  General Comments   Cueing Techniques: Verbal cues;Gestural cues;Tactile cues;Visual cues  daughter present and supportive   Exercises     Shoulder Instructions      Home Living Family/patient expects to be discharged to:: Private residence Living Arrangements: Children (daughter is caregiver) Available Help at Discharge: Family;Available 24 hours/day Type of Home: House Home Access: Stairs to enter [employer] of Steps: 2 (back entrance) Entrance Stairs-Rails: None Home Layout: Two level Alternate Level Stairs-Number of Steps: 2 (between back entrance and kitchen) Alternate Level Stairs-Rails: None Bathroom Shower/Tub: Producer, television/film/video: Standard     Home Equipment: Agricultural consultant (2 wheels);Grab bars - tub/shower   Additional Comments: information provided by daughter      Prior Functioning/Environment Prior Level of Function : Needs assist             Mobility Comments: ambulated no device ADLs Comments: cues to complete ADLs 2/2 dementia    OT Problem List: Decreased strength;Decreased activity tolerance;Decreased range of motion;Impaired balance (sitting and/or standing);Impaired vision/perception;Decreased coordination;Decreased safety awareness;Decreased cognition;Decreased knowledge of use of DME or AE;Impaired sensation;Impaired tone;Impaired UE functional use   OT Treatment/Interventions: Self-care/ADL training;Therapeutic exercise;DME and/or AE instruction;Therapeutic activities;Patient/family education;Balance training;Cognitive remediation/compensation;Visual/perceptual remediation/compensation      OT Goals(Current goals can be found in the care plan section)   Acute Rehab OT Goals Patient Stated Goal: get better OT Goal Formulation: With  patient/family Time For Goal Achievement: 07/27/23 Potential to Achieve Goals: Good   OT Frequency:  Min 2X/week    Co-evaluation PT/OT/SLP Co-Evaluation/Treatment:  Yes Reason for Co-Treatment: Complexity of the patient's impairments (multi-system involvement);For patient/therapist safety;To address functional/ADL transfers PT goals addressed during session: Mobility/safety with mobility;Balance;Strengthening/ROM OT goals addressed during session: ADL's and self-care      AM-PAC OT 6 Clicks Daily Activity     Outcome Measure Help from another person eating meals?: Total Help from another person taking care of personal grooming?: A Lot Help from another person toileting, which includes using toliet, bedpan, or urinal?: A Lot Help from another person bathing (including washing, rinsing, drying)?: A Lot Help from another person to put on and taking off regular upper body clothing?: A Lot Help from another person to put on and taking off regular lower body clothing?: A Lot 6 Click Score: 11   End of Session Equipment Utilized During Treatment: Gait belt Nurse Communication: Mobility status  Activity Tolerance: Patient tolerated treatment well Patient left: in chair;with call bell/phone within reach;with chair alarm set  OT Visit Diagnosis: Unsteadiness on feet (R26.81);Muscle weakness (generalized) (M62.81);Other symptoms and signs involving cognitive function;Cognitive communication deficit (R41.841);Low vision, both eyes (H54.2);Hemiplegia and hemiparesis Symptoms and signs involving cognitive functions: Cerebral infarction Hemiplegia - Right/Left: Left Hemiplegia - caused by: Cerebral infarction                Time: 1111-1145 OT Time Calculation (min): 34 min Charges:  OT General Charges $OT Visit: 1 Visit OT Evaluation $OT Eval Moderate Complexity: 1 Mod  Elma JONETTA Lebron FREDERICK, OTR/L [facility] Acute Rehabilitation Office: [phone]   Elma JONETTA Lebron 07/13/2023, 4:43 PM

## 2023-07-13 NOTE — Progress Notes (Signed)
 Speech Language Pathology Treatment: Dysphagia  Patient Details Name: KEYONDA BICKLE MRN: [id number] DOB: Feb 09, 1950 Today's Date: 07/13/2023 Time: 9150-9140 SLP Time Calculation (min) (ACUTE ONLY): 10 min  Assessment / Plan / Recommendation Clinical Impression  Pt see for po trials with daughter present who has an NGT placed. Pt alert, communicative and following commands. She exhibited decreased labial seal with leakage with thin liquids, improved oral control with puree. Pt continues to present with s/s pharyngeal dysphagia with immediate cough with thin. Nectar resulted in pt sneezing several times and then with subtle throat clear concerning for possible laryngeal invasion. Recommend continue NPO; may have ice chips after oral care if desired. Pt will need an objective assessment with MBS and will schedule as soon as able. Pt/daughter in agreement.    HPI HPI: 73 y.o. female presented 7/3 with L sided weakness, L facial droop, and R gaze preference s/p TNK. CTA revealed R ICA occlusion. PMH includes: bipolar, dementia, DM, HTN, HLD, COPD.      SLP Plan  MBS          Recommendations  Diet recommendations: NPO Medication Administration: Via alternative means                Rehab consult Oral care QID   Frequent or constant Supervision/Assistance Dysphagia, unspecified (R13.10)     MBS     Dustin Olam Bull  07/13/2023, 9:49 AM

## 2023-07-13 NOTE — Evaluation (Signed)
 Physical Therapy Evaluation Patient Details Name: Kathryn Mcguire MRN: [id number] DOB: 10-May-1950 Today's Date: 07/13/2023  History of Present Illness  73 y.o. female arrives 7/3 to [facility] ED presenting symptoms of left side weakness, decreased sensation, facial droop, gaze preference, and left neglect. NIH=20; CT Scans show suspicions of R ICA occlusion. S/p TNK. Pt has a hx of bipolar, dementia, DM, HTN, HLD, COPD.  Clinical Impression   Pt admitted secondary to problem above with deficits below. PTA patient lives with daughter who is her caregiver due to dementia. She ambulated without a device with supervision. Pt currently requires max assist for bed mobility and +2 mod-max assist for sit to stand and bed to chair. She demonstrates left inattention, however was able to track past midline and find her LUE with her Rt hand. She could not advance her LLE in standing, however maintained knee extension without buckling.  Anticipate patient will benefit from PT to address problems listed below. Will continue to follow acutely to maximize functional mobility, independence, and safety.  Patient will benefit from post-acute intensive therapies >3 hrs/day.          If plan is discharge home, recommend the following: Two people to help with walking and/or transfers;Assistance with feeding;Direct supervision/assist for medications management;Direct supervision/assist for financial management;Assist for transportation;Help with stairs or ramp for entrance;Supervision due to cognitive status   Can travel by private vehicle        Equipment Recommendations Wheelchair (measurements PT);Wheelchair cushion (measurements PT)  Recommendations for Other Services  Rehab consult    Functional Status Assessment Patient has had a recent decline in their functional status and demonstrates the ability to make significant improvements in function in a reasonable and predictable amount of time.     Precautions /  Restrictions Precautions Precautions: Fall Recall of Precautions/Restrictions: Impaired      Mobility  Bed Mobility Overal bed mobility: Needs Assistance Bed Mobility: Rolling, Sidelying to Sit Rolling: Max assist Sidelying to sit: Max assist       General bed mobility comments: rolled to left; assist for advancing torso throughout    Transfers Overall transfer level: Needs assistance Equipment used: 2 person hand held assist Transfers: Sit to/from Stand, Bed to chair/wheelchair/BSC Sit to Stand: Mod assist, +2 physical assistance   Step pivot transfers: Max assist, +2 physical assistance       General transfer comment: stepping to chair on her left required total assist to advance LLE, however did not buckle in wt-bearing; assist for balance when advancing each LE    Ambulation/Gait             Pre-gait activities: pivotal steps only    Stairs            Wheelchair Mobility     Tilt Bed    Modified Rankin (Stroke Patients Only) Modified Rankin (Stroke Patients Only) Pre-Morbid Rankin Score: Moderate disability Modified Rankin: Severe disability     Balance Overall balance assessment: Needs assistance Sitting-balance support: No upper extremity supported, Feet supported Sitting balance-Leahy Scale: Poor Sitting balance - Comments: at times total assist with trunk pulling into left lateral flexion with extension; progressed to periods of CGA Postural control: Left lateral lean Standing balance support: Bilateral upper extremity supported Standing balance-Leahy Scale: Poor                               Pertinent Vitals/Pain Pain Assessment Pain Assessment: No/denies pain  Home Living Family/patient expects to be discharged to:: Private residence Living Arrangements: Children (daughter is caregiver) Available Help at Discharge: Family;Available 24 hours/day Type of Home: House Home Access: Stairs to enter Entrance  Stairs-Rails: None Entrance Stairs-Number of Steps: 2 (back entrance) Alternate Level Stairs-Number of Steps: 2 (between back entrance and kitchen) Home Layout: Two level Home Equipment: Agricultural consultant (2 wheels);Grab bars - tub/shower Additional Comments: information provided by daughter    Prior Function Prior Level of Function : Needs assist             Mobility Comments: ambulated no device ADLs Comments: cues to complete ADLs     Extremity/Trunk Assessment   Upper Extremity Assessment Upper Extremity Assessment: Defer to OT evaluation    Lower Extremity Assessment Lower Extremity Assessment: LLE deficits/detail LLE Deficits / Details: knee extension 2+ in sitting; ankle DF 2+; required assist to advance LLE however did not buckle in wt-bearing LLE Sensation: WNL    Cervical / Trunk Assessment Cervical / Trunk Assessment: Kyphotic;Other exceptions Cervical / Trunk Exceptions: with effort, trunk pulls into left lateral flexion with some extension  Communication   Communication Communication: Impaired Factors Affecting Communication: Reduced clarity of speech    Cognition Arousal: Lethargic Behavior During Therapy: Flat affect   PT - Cognitive impairments: History of cognitive impairments                       PT - Cognition Comments: L inattention, but able to reach and find LUE with Rt hand when cued Following commands: Impaired Following commands impaired: Follows one step commands inconsistently, Follows one step commands with increased time     Cueing Cueing Techniques: Verbal cues, Gestural cues, Tactile cues, Visual cues     General Comments General comments (skin integrity, edema, etc.): Daughter present throughout and provided all home set up and prior functional status.    Exercises     Assessment/Plan    PT Assessment Patient needs continued PT services  PT Problem List Decreased strength;Decreased balance;Decreased mobility;Decreased  cognition;Decreased knowledge of use of DME;Decreased safety awareness;Decreased knowledge of precautions;Impaired tone       PT Treatment Interventions DME instruction;Gait training;Stair training;Functional mobility training;Therapeutic activities;Therapeutic exercise;Balance training;Neuromuscular re-education;Cognitive remediation;Patient/family education    PT Goals (Current goals can be found in the Care Plan section)  Acute Rehab PT Goals Patient Stated Goal: unable to state; agrees with getting more independent PT Goal Formulation: With patient Time For Goal Achievement: 07/27/23 Potential to Achieve Goals: Good    Frequency Min 3X/week     Co-evaluation PT/OT/SLP Co-Evaluation/Treatment: Yes Reason for Co-Treatment: Complexity of the patient's impairments (multi-system involvement);For patient/therapist safety;To address functional/ADL transfers PT goals addressed during session: Mobility/safety with mobility;Balance;Strengthening/ROM         AM-PAC PT 6 Clicks Mobility  Outcome Measure Help needed turning from your back to your side while in a flat bed without using bedrails?: A Lot Help needed moving from lying on your back to sitting on the side of a flat bed without using bedrails?: A Lot Help needed moving to and from a bed to a chair (including a wheelchair)?: Total Help needed standing up from a chair using your arms (e.g., wheelchair or bedside chair)?: Total Help needed to walk in hospital room?: Total Help needed climbing 3-5 steps with a railing? : Total 6 Click Score: 8    End of Session Equipment Utilized During Treatment: Gait belt Activity Tolerance: Patient tolerated treatment well Patient left: in chair;with  call bell/phone within reach;with chair alarm set;with family/visitor present Nurse Communication: Mobility status PT Visit Diagnosis: Hemiplegia and hemiparesis Hemiplegia - Right/Left: Left Hemiplegia - dominant/non-dominant:  Non-dominant Hemiplegia - caused by: Cerebral infarction    Time: 1111-1144 PT Time Calculation (min) (ACUTE ONLY): 33 min   Charges:   PT Evaluation $PT Eval Low Complexity: 1 Low   PT General Charges $$ ACUTE PT VISIT: 1 Visit          Macario RAMAN, PT Acute Rehabilitation Services  Office [phone]   Macario SHAUNNA Soja 07/13/2023, 12:24 PM

## 2023-07-13 NOTE — Progress Notes (Signed)
 Pt noted to have pulled NG tube out.  I replaced a 42fr in the right nares to 55cm and auscultation noted in abdomen.  Dr. Alfornia on call for hospitalist group notified.  Mitten placed on pt's right hand to prevent further removals.  Glade Lee BSN RN [facility] 07/13/2023, 9:25 PM   Call made to Dr.Khaliqdina who is covering for neuro this evening.  Discussed results of xray which showed tube coiled in distal esophagus.  I repositioned tube and am awaiting another xray for placement verification. Glade Lee BSN RN CMSRN 07/14/2023, 12:28 AM

## 2023-07-13 NOTE — Progress Notes (Addendum)
 STROKE TEAM PROGRESS NOTE    SIGNIFICANT HOSPITAL EVENTS 7/3: Patient admitted with acute left-sided weakness, left facial droop and right gaze preference, given TNK to treat stroke.  Patient also found to have right ICA occlusion, but family declined revascularization 7/5: Patient transferred out of the ICU  INTERIM HISTORY/SUBJECTIVE Patient is seen in her room with no family at the bedside.  She has remained hemodynamically stable and afebrile overnight, and phenylephrine  has been discontinued.  Discussed yesterday's echocardiogram report with cardiology, and report of tamponade was a typographical error.  MRI reveals stroke in the right MCA and ACA territories as well as some small strokes in the left ACA territory.  Will transfer patient out of the ICU today.  OBJECTIVE  CBC    Component Value Date/Time   WBC 9.3 07/13/2023 0339   RBC 4.67 07/13/2023 0339   HGB 12.1 07/13/2023 0339   HCT 37.3 07/13/2023 0339   PLT 207 07/13/2023 0339   MCV 79.9 (L) 07/13/2023 0339   MCH 25.9 (L) 07/13/2023 0339   MCHC 32.4 07/13/2023 0339   RDW 13.8 07/13/2023 0339   LYMPHSABS 2.5 07/11/2023 1458   MONOABS 0.8 07/11/2023 1458   EOSABS 0.0 07/11/2023 1458   BASOSABS 0.1 07/11/2023 1458    BMET    Component Value Date/Time   NA 135 07/13/2023 0339   K 3.9 07/13/2023 0339   CL 101 07/13/2023 0339   CO2 25 07/13/2023 0339   GLUCOSE 200 (H) 07/13/2023 0339   BUN 19 07/13/2023 0339   CREATININE 0.66 07/13/2023 0339   CREATININE 0.69 07/17/2022 1354   CALCIUM  9.2 07/13/2023 0339   EGFR 93 07/17/2022 1354   GFRNONAA >60 07/13/2023 0339   GFRNONAA 79 01/18/2020 0858    IMAGING past 24 hours DG Abd Portable 1V Result Date: 07/12/2023 CLINICAL DATA:  NG tube placement. EXAM: PORTABLE ABDOMEN - 1 VIEW COMPARISON:  08/04/2013. FINDINGS: The bowel gas pattern is normal. An enteric tube terminates in the stomach and appears appropriate position. No radio-opaque calculi or other acute  radiographic abnormality are seen. IMPRESSION: 1. Nonobstructive bowel gas pattern. 2. Enteric tube terminates in the stomach. Electronically Signed   By: Leita Birmingham M.D.   On: 07/12/2023 15:28   MR BRAIN WO CONTRAST Result Date: 07/12/2023 CLINICAL DATA:  Stroke follow-up, right ICA occlusion. EXAM: MRI HEAD WITHOUT CONTRAST TECHNIQUE: Multiplanar, multiecho pulse sequences of the brain and surrounding structures were obtained without intravenous contrast. COMPARISON:  CT head and CTA head and neck 07/11/2023. FINDINGS: Brain: Diffuse areas of restricted diffusion in the right frontoparietal lobes within the right MCA and right ACA territories. Associated edema and sulcal effacement. Few areas of susceptibility in the right frontoparietal lobes which may reflect developing petechial hemorrhage. There are additional areas of restricted diffusion along the right insular cortex and right temporal lobe. Few areas of restricted diffusion involving the cortex and subcortical white matter in the lateral right occipital lobe. Five additional punctate areas of restricted diffusion in the left basal ganglia primarily in the caudate head and lentiform nucleus. Small focus of restricted diffusion in the parasagittal posterior left frontal lobe at the vertex within the ACA territory. No midline shift. T2/FLAIR hyperintensity in the periventricular and subcortical white matter. Mild parenchymal volume loss. The basilar cisterns are patent. No extra-axial fluid collections. Ventricles: Normal size and configuration of the ventricles. Vascular: Skull base flow voids are visualized. Right ICA flow void is visualized at the skull base. Skull and upper cervical spine: No focal  abnormality. Sinuses/Orbits: Orbits are symmetric. Paranasal sinuses are clear. Other: Mastoid air cells are clear. IMPRESSION: Scattered areas of acute infarct throughout the right MCA and right ACA territories. Additional punctate areas of acute infarct  in the left basal ganglia and within the posterior aspect of the left ACA territory. Mild edema and sulcal effacement in the right frontoparietal lobes. No midline shift. Mild chronic microvascular ischemic changes. Right ICA flow void is visualized at the skull base which may reflect interval improvement in flow within the vessel compared to the prior CTA. Electronically Signed   By: Donnice Mania M.D.   On: 07/12/2023 15:22    Vitals:   07/13/23 0600 07/13/23 0700 07/13/23 0702 07/13/23 0800  BP: (!) 98/53 (!) 74/51 (!) 90/53 106/65  Pulse: 78 77 77 81  Resp: 13 14 13 15   Temp:    98.1 F (36.7 C)  TempSrc:    Oral  SpO2: 100% 100% 100% 100%  Weight:      Height:         PHYSICAL EXAM General:  Alert, well-nourished, well-developed elderly patient in no acute distress Psych:  Mood and affect appropriate for situation CV: Regular rate and rhythm on monitor Respiratory:  Regular, unlabored respirations on room air  NEURO:  Mental Status: Alert and oriented to person and place, gives the right month but the wrong age Speech/Language: speech is with mild dysarthria,  able to name 2 out of 3 objects, repetition intact  Cranial Nerves:  II: PERRL.  Blinks to threat bilaterally III, IV, VI: Right gaze preference but able to cross midline V: Sensation is intact to light touch and symmetrical to face.  VII: Left facial droop VIII: hearing intact to voice. IX, X: Phonation is normal.  XII: tongue is midline without fasciculations. Motor: Able to move right upper and lower extremities with good antigravity strength, no movement of left upper extremity, withdraws left lower extremity to noxious Tone: is normal and bulk is normal Sensation- Intact to light touch bilaterally.  Coordination: FTN intact on the right, unable to perform on the left Gait- deferred  Most Recent NIH  1a Level of Conscious.: 0 1b LOC Questions: 1 1c LOC Commands: 0 2 Best Gaze: 1 3 Visual: 0 4 Facial  Palsy: 0 5a Motor Arm - left: 4 5b Motor Arm - Right: 0 6a Motor Leg - Left: 4 6b Motor Leg - Right: 0 7 Limb Ataxia: 0 8 Sensory: 0 9 Best Language: 0 10 Dysarthria: 1 11 Extinct. and Inatten.: 0 TOTAL: 11   ASSESSMENT/PLAN  Ms. AMIRI TRITCH is a 73 y.o. female with history of diabetes, hypertension, hyperlipidemia and COPD admitted for acute onset left-sided weakness, left facial droop and right gaze preference.  Patient was given TNK to treat her stroke, and was found to have a right internal carotid occlusion.  Family declined interventional radiology given her baseline functional status.  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 ASPECTS 10.    CTA head & neck occlusion of the right ICA from proximal cervical segment to cavernous segment with reconstitution of paraclinoid segment likely via circle of Willis, decreased caliber and number of right MCA branches, decreased caliber in number of right ACA branches, severe stenosis at the origin of the left nondominant VA, multifocal stenosis and irregularity of the left V2 segment, likely chronic occlusion of left  V4 segment after the origin of the left PICA, moderate stenosis of right PCA P2 segment, moderate stenosis of the origin of the left ICA MRI Scattered areas of acute infarct throughout the right MCA and right ACA territories. 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 segment cavernous segment with reconstitution at the paraclinoid segment noted on CT  angiogram Likely acute on chronic Family declined interventions for this given baseline functional status On DAPT  Hypertension Now hypotension Home meds: None Stable now  off phenylephrine  Keep systolic blood pressure greater than 110 Avoid low BP given R ICA occlusion Long term BP goal normotensive  Hyperlipidemia Home meds: Atorvastatin  20 mg daily, increased to 40 LDL 110, goal < 70 Continue statin at discharge  Diabetes type II poorly controlled Home meds: Insulin  70/30 6 units twice daily, metformin  1000 mg twice daily HgbA1c 12.3, goal < 7.0 CBGs SSI Recommend close follow-up with PCP for better DM control  Tobacco Abuse Patient chewing tobacco, large amount per daughters Tobacco cessation discussed with patient and family  Dysphagia Patient has post-stroke dysphagia SLP consulted Now NPO On TF @ 40cc  Other Stroke Risk Factors Advanced age  Other Active Problems Mild dementia - resume home aricept  COPD Mild leukocytosis WBC 10.9--9.3  Hospital day # 2  Patient seen by NP with MD, MD to edit note as needed. Cortney E Everitt Clint Kill , MSN, AGACNP-BC [facility] See Amion for schedule and pager information 07/13/2023 11:24 AM  ATTENDING NOTE: I reviewed above note and agree with the assessment and plan. Pt was seen and examined.   2 daughters are at bedside.  Patient drowsy sleepy, hard to arouse, but eventually open eyes with repetitive stimulation, she was able to tell me in the hospital, told me age 56 instead of 69, not orientated to time or situation, but able to say I do not know. Paucity of speech, following most simple commands. Naming 2/3 and able to repeat 3 word sentence.  Right forced gaze, not tracking to the left, blinking to visual threat on right but not on left, PERRL.  Left facial droop. Tongue protrusion not corporative.  Right upper extremity against gravity, follow commands on the right hand.  Left upper extremity flaccid.   Right lower extremity 3/5 proximal and able to hold knee flexion and foot in bed position, left lower extremity no spontaneous movement, but mild withdrawal to pain stimulation. Sensation, coordination not corporative and gait not tested.   For detailed assessment and plan, please refer to above as I have made changes wherever appropriate.   Ary Cummins, MD PhD Stroke Neurology 07/13/2023 10:25 PM  This patient is critically ill due to large stroke, R ICA occlusion, hypotension, hyperglycemia, dysphagia and at significant risk of neurological worsening, death form recurrent stroke, DKA, sepsis, aspiration and seizure. This patient's care requires constant monitoring of vital signs, hemodynamics, respiratory and cardiac monitoring, review of multiple databases, neurological assessment, discussion with family, other specialists and medical decision making of high complexity. I spent 40 minutes of neurocritical care time in the care of this patient. I had long discussion with daughters at bedside, updated pt current condition, treatment plan  and potential prognosis, and answered all the questions. They expressed understanding and appreciation.      To contact Stroke Continuity provider, please refer to [email or web address]. After hours, contact General Neurology

## 2023-07-14 ENCOUNTER — Inpatient Hospital Stay (HOSPITAL_COMMUNITY)

## 2023-07-14 DIAGNOSIS — I63511 Cerebral infarction due to unspecified occlusion or stenosis of right middle cerebral artery: Secondary | ICD-10-CM | POA: Diagnosis not present

## 2023-07-14 DIAGNOSIS — I63523 Cerebral infarction due to unspecified occlusion or stenosis of bilateral anterior cerebral arteries: Secondary | ICD-10-CM | POA: Diagnosis not present

## 2023-07-14 DIAGNOSIS — R29711 NIHSS score 11: Secondary | ICD-10-CM | POA: Diagnosis not present

## 2023-07-14 DIAGNOSIS — I6521 Occlusion and stenosis of right carotid artery: Secondary | ICD-10-CM | POA: Diagnosis not present

## 2023-07-14 LAB — GLUCOSE, CAPILLARY
Glucose-Capillary: 207 mg/dL — ABNORMAL HIGH (ref 70–99)
Glucose-Capillary: 241 mg/dL — ABNORMAL HIGH (ref 70–99)
Glucose-Capillary: 261 mg/dL — ABNORMAL HIGH (ref 70–99)
Glucose-Capillary: 280 mg/dL — ABNORMAL HIGH (ref 70–99)
Glucose-Capillary: 289 mg/dL — ABNORMAL HIGH (ref 70–99)
Glucose-Capillary: 291 mg/dL — ABNORMAL HIGH (ref 70–99)
Glucose-Capillary: 307 mg/dL — ABNORMAL HIGH (ref 70–99)
Glucose-Capillary: 415 mg/dL — ABNORMAL HIGH (ref 70–99)

## 2023-07-14 LAB — CBC
HCT: 33.9 % — ABNORMAL LOW (ref 36.0–46.0)
Hemoglobin: 11.3 g/dL — ABNORMAL LOW (ref 12.0–15.0)
MCH: 26.5 pg (ref 26.0–34.0)
MCHC: 33.3 g/dL (ref 30.0–36.0)
MCV: 79.4 fL — ABNORMAL LOW (ref 80.0–100.0)
Platelets: 197 K/uL (ref 150–400)
RBC: 4.27 MIL/uL (ref 3.87–5.11)
RDW: 13.8 % (ref 11.5–15.5)
WBC: 10.4 K/uL (ref 4.0–10.5)
nRBC: 0 % (ref 0.0–0.2)

## 2023-07-14 LAB — BASIC METABOLIC PANEL WITH GFR
Anion gap: 9 (ref 5–15)
BUN: 18 mg/dL (ref 8–23)
CO2: 23 mmol/L (ref 22–32)
Calcium: 8.8 mg/dL — ABNORMAL LOW (ref 8.9–10.3)
Chloride: 99 mmol/L (ref 98–111)
Creatinine, Ser: 0.67 mg/dL (ref 0.44–1.00)
GFR, Estimated: >60 mL/min (ref >60)
Glucose, Bld: 275 mg/dL — ABNORMAL HIGH (ref 70–99)
Potassium: 3.7 mmol/L (ref 3.5–5.1)
Sodium: 131 mmol/L — ABNORMAL LOW (ref 135–145)

## 2023-07-14 LAB — MAGNESIUM: Magnesium: 1.9 mg/dL (ref 1.7–2.4)

## 2023-07-14 LAB — PHOSPHORUS: Phosphorus: 3.2 mg/dL (ref 2.5–4.6)

## 2023-07-14 MED ORDER — OSMOLITE 1.2 CAL PO LIQD
750.0000 mL | ORAL | Status: DC
  Administered 2023-07-14: 750 mL

## 2023-07-14 NOTE — Progress Notes (Signed)
 Patient is back to the unit.

## 2023-07-14 NOTE — Progress Notes (Addendum)
 NG tube dislodged, advanced the tube to 64cm, waiting for X-ray for placement verification  1500: NG tube placement verified by KUB X-ray, patient will be on nocturnal tube feeding

## 2023-07-14 NOTE — Progress Notes (Signed)
 Patient is off the unit for MBS

## 2023-07-14 NOTE — Plan of Care (Signed)
  Problem: Ischemic Stroke/TIA Tissue Perfusion: Goal: Complications of ischemic stroke/TIA will be minimized Outcome: Progressing   Problem: Coping: Goal: Will verbalize positive feelings about self Outcome: Progressing Goal: Will identify appropriate support needs Outcome: Progressing   Problem: Health Behavior/Discharge Planning: Goal: Goals will be collaboratively established with patient/family Outcome: Progressing   Problem: Self-Care: Goal: Ability to communicate needs accurately will improve Outcome: Progressing   Problem: Nutrition: Goal: Risk of aspiration will decrease Outcome: Progressing Goal: Dietary intake will improve Outcome: Progressing   Problem: Education: Goal: Knowledge of General Education information will improve Description: Including pain rating scale, medication(s)/side effects and non-pharmacologic comfort measures Outcome: Progressing   Problem: Clinical Measurements: Goal: Respiratory complications will improve Outcome: Progressing Goal: Cardiovascular complication will be avoided Outcome: Progressing   Problem: Nutrition: Goal: Adequate nutrition will be maintained Outcome: Progressing   Problem: Skin Integrity: Goal: Risk for impaired skin integrity will decrease Outcome: Progressing   Problem: Skin Integrity: Goal: Risk for impaired skin integrity will decrease Outcome: Progressing

## 2023-07-14 NOTE — Progress Notes (Addendum)
 STROKE TEAM PROGRESS NOTE    SIGNIFICANT HOSPITAL EVENTS 7/3: Patient admitted with acute left-sided weakness, left facial droop and right gaze preference, given TNK to treat stroke.  Patient also found to have right ICA occlusion, but family declined revascularization 7/5: Patient transferred out of the ICU  INTERIM HISTORY/SUBJECTIVE Patient is seen in her room with her daughter at the bedside.  She has remained hemodynamically stable and afebrile and has passed her swallow study.  Will initiate pured diet with cycle tube feeds at night.  She partially dislodged her NG tube, and it has been reinserted.  Awaiting KUB for confirmation.  OBJECTIVE  CBC    Component Value Date/Time   WBC 10.4 07/14/2023 0342   RBC 4.27 07/14/2023 0342   HGB 11.3 (L) 07/14/2023 0342   HCT 33.9 (L) 07/14/2023 0342   PLT 197 07/14/2023 0342   MCV 79.4 (L) 07/14/2023 0342   MCH 26.5 07/14/2023 0342   MCHC 33.3 07/14/2023 0342   RDW 13.8 07/14/2023 0342   LYMPHSABS 2.5 07/11/2023 1458   MONOABS 0.8 07/11/2023 1458   EOSABS 0.0 07/11/2023 1458   BASOSABS 0.1 07/11/2023 1458    BMET    Component Value Date/Time   NA 131 (L) 07/14/2023 0342   K 3.7 07/14/2023 0342   CL 99 07/14/2023 0342   CO2 23 07/14/2023 0342   GLUCOSE 275 (H) 07/14/2023 0342   BUN 18 07/14/2023 0342   CREATININE 0.67 07/14/2023 0342   CREATININE 0.69 07/17/2022 1354   CALCIUM  8.8 (L) 07/14/2023 0342   EGFR 93 07/17/2022 1354   GFRNONAA >60 07/14/2023 0342   GFRNONAA 79 01/18/2020 0858    IMAGING past 24 hours DG Swallowing Func-Speech Pathology Result Date: 07/14/2023 Table formatting from the original result was not included. Modified Barium Swallow Study Patient Details Name: Kathryn Mcguire MRN: [id number] Date of Birth: [birth year] Today's Date: 07/14/2023 HPI/PMH: HPI: 73 y.o. female presented 7/3 with L sided weakness, L facial droop, and R gaze preference s/p TNK. CTA revealed R ICA occlusion. PMH includes: bipolar,  dementia, DM, HTN, HLD, COPD. Clinical Impression: Pt presents with a primary oral dysphagia with left sided lingual and facial weakness with additional sensory and attention impairment. Pt has disorganized lingual collection of bolus with passive spillage to the pyriform sinus and modreate oral residuals in the left floor of mouth and buccal cavity. There is an added pharyngeal impairment, likely related to decreased sensation to trigger swallow. Bolus pools in pharynx for 5 to 30 seconds, in one instance leading to sensed aspiration before the swallow with thin liquids. Cough did not immediately eject aspirate, but pt had prolonged coughing that continued to mobilize aspirate to upper trachea. Further trials of straw sips of thin liquids were not aspirated. Pt benefits from cues to swallow twice: to clear oral residue and swallow.  Pt will be at risk of aspiraton of oral residue regardless of texture. Pt demonstrated improvement in timing of airway protection throughout study and would be more likely to mobilize aspirate of thin liquids. Advise initiating pureed solids (pt edentulous with severe oral weakness) and thin liquids, with full supervision and assist needed for oral intake. Focus on oral hygiene, water  intake, pt awareness and second swallow. If pt coughs, encourage harder coughing for pulmonary hygiene. Factors that may increase risk of adverse event in presence of aspiration Noe & Lianne 2021): Factors that may increase risk of adverse event in presence of aspiration Noe & Lianne 2021): Dependence for feeding and/or oral  hygiene; Reduced cognitive function Recommendations/Plan: Swallowing Evaluation Recommendations Swallowing Evaluation Recommendations Recommendations: PO diet PO Diet Recommendation: Dysphagia 1 (Pureed); Thin liquids (Level 0) Liquid Administration via: Straw Medication Administration: Crushed with puree Supervision: Full assist for feeding; Full supervision/cueing for  swallowing strategies Swallowing strategies  : Slow rate; Small bites/sips; Check for pocketing or oral holding; Check for anterior loss; Multiple dry swallows after each bite/sip; Hard cough after swallowing Postural changes: Stay upright 30-60 min after meals; Position pt fully upright for meals Oral care recommendations: Oral care before PO; Oral care QID (4x/day) Caregiver Recommendations: Have oral suction available Treatment Plan Treatment Plan Treatment recommendations: Therapy as outlined in treatment plan below Follow-up recommendations: Acute inpatient rehab (3 hours/day) Treatment frequency: Min 2x/week Treatment duration: 2 weeks Recommendations Recommendations for follow up therapy are one component of a multi-disciplinary discharge planning process, led by the attending physician.  Recommendations may be updated based on patient status, additional functional criteria and insurance authorization. Assessment: Orofacial Exam: Orofacial Exam Oral Cavity: Oral Hygiene: WFL Oral Cavity - Dentition: Edentulous Oral Motor/Sensory Function: Suspected cranial nerve impairment CN V - Trigeminal: Left sensory impairment CN VII - Facial: Left motor impairment CN IX - Glossopharyngeal, CN X - Vagus: Left motor impairment CN XII - Hypoglossal: Left motor impairment Anatomy: Anatomy: WFL Boluses Administered: Boluses Administered Boluses Administered: Thin liquids (Level 0); Mildly thick liquids (Level 2, nectar thick); Moderately thick liquids (Level 3, honey thick); Puree; Solid  Oral Impairment Domain: Oral Impairment Domain Lip Closure: Escape beyond mid-chin Tongue control during bolus hold: Not tested Bolus preparation/mastication: -- (NT) Bolus transport/lingual motion: Repetitive/disorganized tongue motion; Slow tongue motion Oral residue: Residue collection on oral structures Location of oral residue : Floor of mouth; Lateral sulci Initiation of pharyngeal swallow : Pyriform sinuses  Pharyngeal Impairment  Domain: Pharyngeal Impairment Domain Soft palate elevation: No bolus between soft palate (SP)/pharyngeal wall (PW) Laryngeal elevation: Complete superior movement of thyroid  cartilage with complete approximation of arytenoids to epiglottic petiole Anterior hyoid excursion: Complete anterior movement Epiglottic movement: No inversion Laryngeal vestibule closure: Complete, no air/contrast in laryngeal vestibule Pharyngeal stripping wave : Present - complete Pharyngoesophageal segment opening: Complete distension and complete duration, no obstruction of flow Tongue base retraction: Trace column of contrast or air between tongue base and PPW Pharyngeal residue: Trace residue within or on pharyngeal structures Location of pharyngeal residue: Valleculae  Esophageal Impairment Domain: Esophageal Impairment Domain Esophageal clearance upright position: -- (NT) Pill: No data recorded Penetration/Aspiration Scale Score: Penetration/Aspiration Scale Score 1.  Material does not enter airway: Mildly thick liquids (Level 2, nectar thick); Moderately thick liquids (Level 3, honey thick); Puree; Thin liquids (Level 0) 2.  Material enters airway, remains ABOVE vocal cords then ejected out: Thin liquids (Level 0); Mildly thick liquids (Level 2, nectar thick) 7.  Material enters airway, passes BELOW cords and not ejected out despite cough attempt by patient: Thin liquids (Level 0) Compensatory Strategies: No data recorded  General Information: Caregiver present: No  Diet Prior to this Study: NPO   Temperature : Normal   Respiratory Status: WFL   Supplemental O2: None (Room air)   History of Recent Intubation: No  Behavior/Cognition: Alert; Cooperative; Requires cueing Self-Feeding Abilities: Needs assist with self-feeding Baseline vocal quality/speech: Not observed No data recorded Volitional Swallow: Able to elicit Exam Limitations: Excessive movement Goal Planning: Prognosis for improved oropharyngeal function: Good Barriers to  Reach Goals: Cognitive deficits No data recorded No data recorded Consulted and agree with results and recommendations: Pt unable/family  or caregiver not available Pain: Pain Assessment Pain Assessment: No/denies pain Faces Pain Scale: 0 End of Session: Start Time:SLP Start Time (ACUTE ONLY): 1030 Stop Time: SLP Stop Time (ACUTE ONLY): 1050 Time Calculation:SLP Time Calculation (min) (ACUTE ONLY): 20 min Charges: SLP Evaluations $ SLP Speech Visit: 1 Visit SLP Evaluations $MBS Swallow: 1 Procedure $Swallowing Treatment: 1 Procedure SLP visit diagnosis: SLP Visit Diagnosis: Dysphagia, oropharyngeal phase (R13.12) Past Medical History: Past Medical History: Diagnosis Date  Arthritis   Bipolar depression (HCC)   COPD (chronic obstructive pulmonary disease) (HCC)   Dementia (HCC)   Diabetes mellitus without complication (HCC)   Elevated LFTs   Emphysema of lung (HCC)   GERD (gastroesophageal reflux disease)   History of adenomatous polyps of colon 10/15/2013  Hyperlipemia   Hypertension   UTI (lower urinary tract infection)  Past Surgical History: Past Surgical History: Procedure Laterality Date  COLONOSCOPY    ESOPHAGOGASTRODUODENOSCOPY    TONSILLECTOMY    TONSILLECTOMY AND ADENOIDECTOMY    TUBAL LIGATION   Mcguire, Kathryn Fitch 07/14/2023, 11:13 AM  DG Abd 1 View Result Date: 07/14/2023 CLINICAL DATA:  NG tube placement EXAM: ABDOMEN - 1 VIEW COMPARISON:  07/13/2023 FINDINGS: Limited field of view for tube placement verification purposes. An enteric tube is present with tip projecting over the left upper quadrant consistent with location in the upper stomach. This has been advanced since the prior study. Scattered gas and stool throughout the colon. No small or large bowel distention. Visualized lung bases are clear. Degenerative changes in the spine. IMPRESSION: Enteric tube tip projects over the left upper quadrant consistent with location in the upper stomach. Electronically Signed   By: Elsie Gravely M.D.    On: 07/14/2023 01:55   DG Abd 1 View Result Date: 07/13/2023 CLINICAL DATA:  Initial evaluation for NG tube placement EXAM: ABDOMEN - 1 VIEW COMPARISON:  Prior radiograph from 07/12/2023 FINDINGS: Enteric tube in place and is seen coiled back on itself above the diaphragm, likely in the distal esophagus. Tip projects cephalad at. Repositioning recommended. Visualized bowel gas pattern is nonobstructive. No soft tissue mass or abnormal calcification Visualized lungs are clear. IMPRESSION: Enteric tube coiled back on itself above the diaphragm, likely in the distal esophagus. Repositioning recommended. Electronically Signed   By: Morene Hoard M.D.   On: 07/13/2023 23:24    Vitals:   07/14/23 0426 07/14/23 0500 07/14/23 0808 07/14/23 1154  BP: 97/63  (!) 101/58 (!) 119/56  Pulse: 80  80 78  Resp: 17  18 18   Temp: 98.1 F (36.7 C)  99.2 F (37.3 C) 98.3 F (36.8 C)  TempSrc: Oral  Oral Oral  SpO2: 97%  100% 96%  Weight:  55.7 kg    Height:         PHYSICAL EXAM General:  Alert, well-nourished, well-developed elderly patient in no acute distress Psych:  Mood and affect appropriate for situation CV: Regular rate and rhythm on monitor Respiratory:  Regular, unlabored respirations on room air  NEURO:  Mental Status: Alert and oriented to person and place, gives the right month but the wrong age Speech/Language: speech is with mild dysarthria,    Cranial Nerves:  II: PERRL.  Blinks to threat bilaterally III, IV, VI: Right gaze preference but able to cross midline V: Sensation is intact to light touch and symmetrical to face.  VII: Left facial droop VIII: hearing intact to voice. IX, X: Phonation is normal.  XII: tongue is midline without fasciculations. Motor: Able to move  right upper and lower extremities with good antigravity strength, no movement of left upper extremity, withdraws left lower extremity to noxious Tone: is normal and bulk is normal Sensation- Intact to light  touch bilaterally.  Coordination: FTN intact on the right, unable to perform on the left Gait- deferred  Most Recent NIH  1a Level of Conscious.: 0 1b LOC Questions: 1 1c LOC Commands: 0 2 Best Gaze: 1 3 Visual: 0 4 Facial Palsy: 0 5a Motor Arm - left: 4 5b Motor Arm - Right: 0 6a Motor Leg - Left: 4 6b Motor Leg - Right: 0 7 Limb Ataxia: 0 8 Sensory: 0 9 Best Language: 0 10 Dysarthria: 1 11 Extinct. and Inatten.: 0 TOTAL: 11   ASSESSMENT/PLAN  Ms. Kathryn Mcguire is a 73 y.o. female with history of diabetes, hypertension, hyperlipidemia and COPD admitted for acute onset left-sided weakness, left facial droop and right gaze preference.  Patient was given TNK to treat her stroke, and was found to have a right internal carotid occlusion.  Family declined interventional radiology given her baseline functional status.  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 ASPECTS 10.    CTA head & neck occlusion of the right ICA from proximal cervical segment to cavernous segment with reconstitution of paraclinoid segment likely via circle of Willis, decreased caliber and number of right MCA branches, decreased caliber in number of right ACA branches, severe stenosis at the origin of the left nondominant VA, multifocal stenosis and irregularity of the left V2 segment, likely chronic occlusion of left V4 segment after the origin of the left PICA, moderate stenosis of right PCA P2 segment, moderate stenosis of the origin of the left ICA MRI Scattered areas of acute infarct throughout the right MCA and right ACA territories. Additional punctate areas of acute infarct in the left basal ganglia and within the posterior aspect of the left ACA territory 2D Echo EF > 75% LDL 110 HgbA1c 12.3 UDS neg VTE prophylaxis - lovenox  No antithrombotic prior to admission, now on ASA 81 and  plavix  DAPT for 3 months and then ASA alone Therapy recommendations:  CIR Disposition: Pending  Right ICA occlusion Occlusion of right ICA from proximal cervical segment cavernous segment with reconstitution at the paraclinoid segment noted on CT angiogram Likely acute on chronic Family declined interventions for this given baseline functional status On DAPT  Hypertension Now hypotension Home meds: None Stable now  off phenylephrine  Keep systolic blood pressure greater than 110 Avoid low BP given R ICA occlusion Long term BP goal normotensive  Hyperlipidemia Home meds: Atorvastatin  20 mg daily, increased to 40 LDL 110, goal < 70 Continue statin at discharge  Diabetes type II poorly controlled Home meds: Insulin  70/30 6 units twice daily, metformin  1000 mg twice daily HgbA1c 12.3, goal < 7.0 CBGs SSI Recommend close follow-up with PCP for better DM control  Tobacco Abuse Patient chewing tobacco, large amount per daughters Tobacco cessation discussed with patient and family  Dysphagia Patient has post-stroke dysphagia SLP consulted Now on dysphagia 1 diet with thin liquids recommended On nocturnal TF If eating adequately will DC tube feeding  Other Stroke Risk Factors Advanced age  Other Active Problems Mild dementia - resume home aricept  COPD Mild leukocytosis WBC 10.9--9.3--10.4 Hyponatremia sodium 135--131  Hospital day # 3  Patient seen by NP with MD, MD to edit note as  needed. Cortney E Everitt Clint Kill , MSN, AGACNP-BC [facility] See Amion for schedule and pager information 07/14/2023 12:10 PM  ATTENDING NOTE: I reviewed above note and agree with the assessment and plan. Pt was seen and examined.   No family at bedside.  Patient more awake alert than yesterday, orientated to place and age.  Still has left facial droop and left hemiplegia.  Had a swallow on dysphagia 1 and thin liquid.  Change tube feeding to nocturnal tube feeding, if patient  eats well will DC tube feeding.  Pending CIR placement.  Continue DAPT and statin.  For detailed assessment and plan, please refer to above as I have made changes wherever appropriate.   Ary Cummins, MD PhD Stroke Neurology 07/14/2023 5:16 PM   To contact Stroke Continuity provider, please refer to [email or web address]. After hours, contact General Neurology

## 2023-07-14 NOTE — Progress Notes (Signed)
 Modified Barium Swallow Study  Patient Details  Name: Kathryn Mcguire MRN: [id number] Date of Birth: 1950-02-10  Today's Date: 07/14/2023  Modified Barium Swallow completed.  Full report located under Chart Review in the Imaging Section.  History of Present Illness 73 y.o. female presented 7/3 with L sided weakness, L facial droop, and R gaze preference s/p TNK. CTA revealed R ICA occlusion. PMH includes: bipolar, dementia, DM, HTN, HLD, COPD.   Clinical Impression Pt presents with a primary oral dysphagia with left sided lingual and facial weakness with additional sensory and attention impairment. Pt has disorganized lingual collection of bolus with passive spillage to the pyriform sinus and modreate oral residuals in the left floor of mouth and buccal cavity. There is an added pharyngeal impairment, likely related to decreased sensation to trigger swallow. Bolus pools in pharynx for 5 to 30 seconds, in one instance leading to sensed aspiration before the swallow with thin liquids. Cough did not immediately eject aspirate, but pt had prolonged coughing that continued to mobilize aspirate to upper trachea. Further trials of straw sips of thin liquids were not aspirated. Pt benefits from cues to swallow twice: to clear oral residue and swallow.   Pt will be at risk of aspiraton of oral residue regardless of texture. Pt demonstrated improvement in timing of airway protection throughout study and would be more likely to mobilize aspirate of thin liquids. Advise initiating pureed solids (pt edentulous with severe oral weakness) and thin liquids, with full supervision and assist needed for oral intake. Focus on oral hygiene, water  intake, pt awareness and second swallow. If pt coughs, encourage harder coughing for pulmonary hygiene.  Factors that may increase risk of adverse event in presence of aspiration Noe & Lianne 2021): Dependence for feeding and/or oral hygiene;Reduced cognitive  function  Swallow Evaluation Recommendations Recommendations: PO diet PO Diet Recommendation: Dysphagia 1 (Pureed);Thin liquids (Level 0) Liquid Administration via: Straw Medication Administration: Crushed with puree Supervision: Full assist for feeding;Full supervision/cueing for swallowing strategies Swallowing strategies  : Slow rate;Small bites/sips;Check for pocketing or oral holding;Check for anterior loss;Multiple dry swallows after each bite/sip;Hard cough after swallowing Postural changes: Stay upright 30-60 min after meals;Position pt fully upright for meals Oral care recommendations: Oral care before PO;Oral care QID (4x/day) Caregiver Recommendations: Have oral suction available      Nasiir Monts, Consuelo Fitch 07/14/2023,11:12 AM

## 2023-07-15 DIAGNOSIS — G8194 Hemiplegia, unspecified affecting left nondominant side: Secondary | ICD-10-CM

## 2023-07-15 DIAGNOSIS — I63511 Cerebral infarction due to unspecified occlusion or stenosis of right middle cerebral artery: Secondary | ICD-10-CM | POA: Diagnosis not present

## 2023-07-15 DIAGNOSIS — E440 Moderate protein-calorie malnutrition: Secondary | ICD-10-CM | POA: Insufficient documentation

## 2023-07-15 DIAGNOSIS — I63523 Cerebral infarction due to unspecified occlusion or stenosis of bilateral anterior cerebral arteries: Secondary | ICD-10-CM | POA: Diagnosis not present

## 2023-07-15 DIAGNOSIS — R29711 NIHSS score 11: Secondary | ICD-10-CM | POA: Diagnosis not present

## 2023-07-15 DIAGNOSIS — I63231 Cerebral infarction due to unspecified occlusion or stenosis of right carotid arteries: Secondary | ICD-10-CM | POA: Diagnosis not present

## 2023-07-15 DIAGNOSIS — I69391 Dysphagia following cerebral infarction: Secondary | ICD-10-CM | POA: Diagnosis not present

## 2023-07-15 DIAGNOSIS — I6521 Occlusion and stenosis of right carotid artery: Secondary | ICD-10-CM | POA: Diagnosis not present

## 2023-07-15 LAB — GLUCOSE, CAPILLARY
Glucose-Capillary: 124 mg/dL — ABNORMAL HIGH (ref 70–99)
Glucose-Capillary: 130 mg/dL — ABNORMAL HIGH (ref 70–99)
Glucose-Capillary: 188 mg/dL — ABNORMAL HIGH (ref 70–99)
Glucose-Capillary: 190 mg/dL — ABNORMAL HIGH (ref 70–99)
Glucose-Capillary: 251 mg/dL — ABNORMAL HIGH (ref 70–99)
Glucose-Capillary: 257 mg/dL — ABNORMAL HIGH (ref 70–99)
Glucose-Capillary: 369 mg/dL — ABNORMAL HIGH (ref 70–99)

## 2023-07-15 LAB — CBC
HCT: 35.7 % — ABNORMAL LOW (ref 36.0–46.0)
Hemoglobin: 11.4 g/dL — ABNORMAL LOW (ref 12.0–15.0)
MCH: 25.9 pg — ABNORMAL LOW (ref 26.0–34.0)
MCHC: 31.9 g/dL (ref 30.0–36.0)
MCV: 81.1 fL (ref 80.0–100.0)
Platelets: 200 K/uL (ref 150–400)
RBC: 4.4 MIL/uL (ref 3.87–5.11)
RDW: 13.7 % (ref 11.5–15.5)
WBC: 10.3 K/uL (ref 4.0–10.5)
nRBC: 0 % (ref 0.0–0.2)

## 2023-07-15 LAB — BASIC METABOLIC PANEL WITH GFR
Anion gap: 12 (ref 5–15)
BUN: 21 mg/dL (ref 8–23)
CO2: 22 mmol/L (ref 22–32)
Calcium: 8.9 mg/dL (ref 8.9–10.3)
Chloride: 99 mmol/L (ref 98–111)
Creatinine, Ser: 0.63 mg/dL (ref 0.44–1.00)
GFR, Estimated: >60 mL/min (ref >60)
Glucose, Bld: 217 mg/dL — ABNORMAL HIGH (ref 70–99)
Potassium: 4.4 mmol/L (ref 3.5–5.1)
Sodium: 133 mmol/L — ABNORMAL LOW (ref 135–145)

## 2023-07-15 LAB — MAGNESIUM: Magnesium: 1.8 mg/dL (ref 1.7–2.4)

## 2023-07-15 LAB — PHOSPHORUS: Phosphorus: 3.7 mg/dL (ref 2.5–4.6)

## 2023-07-15 MED ORDER — ENSURE PLUS HIGH PROTEIN PO LIQD
237.0000 mL | Freq: Two times a day (BID) | ORAL | Status: DC
  Administered 2023-07-15 – 2023-07-16 (×2): 237 mL via ORAL

## 2023-07-15 NOTE — Plan of Care (Signed)
  Problem: Education: Goal: Knowledge of disease or condition will improve Outcome: Not Progressing Goal: Knowledge of secondary prevention will improve (MUST DOCUMENT ALL) Outcome: Not Progressing Goal: Knowledge of patient specific risk factors will improve (DELETE if not current risk factor) Outcome: Not Progressing   Problem: Ischemic Stroke/TIA Tissue Perfusion: Goal: Complications of ischemic stroke/TIA will be minimized Outcome: Not Progressing   Problem: Coping: Goal: Will verbalize positive feelings about self Outcome: Not Progressing Goal: Will identify appropriate support needs Outcome: Not Progressing   Problem: Health Behavior/Discharge Planning: Goal: Ability to manage health-related needs will improve Outcome: Not Progressing Goal: Goals will be collaboratively established with patient/family Outcome: Not Progressing   Problem: Self-Care: Goal: Ability to participate in self-care as condition permits will improve Outcome: Not Progressing Goal: Verbalization of feelings and concerns over difficulty with self-care will improve Outcome: Not Progressing Goal: Ability to communicate needs accurately will improve Outcome: Not Progressing   Problem: Nutrition: Goal: Risk of aspiration will decrease Outcome: Not Progressing Goal: Dietary intake will improve Outcome: Not Progressing   Problem: Education: Goal: Knowledge of General Education information will improve Description: Including pain rating scale, medication(s)/side effects and non-pharmacologic comfort measures Outcome: Not Progressing   Problem: Health Behavior/Discharge Planning: Goal: Ability to manage health-related needs will improve Outcome: Not Progressing   Problem: Clinical Measurements: Goal: Ability to maintain clinical measurements within normal limits will improve Outcome: Not Progressing Goal: Will remain free from infection Outcome: Not Progressing Goal: Diagnostic test results will  improve Outcome: Not Progressing Goal: Respiratory complications will improve Outcome: Not Progressing Goal: Cardiovascular complication will be avoided Outcome: Not Progressing   Problem: Activity: Goal: Risk for activity intolerance will decrease Outcome: Not Progressing   Problem: Nutrition: Goal: Adequate nutrition will be maintained Outcome: Not Progressing   Problem: Coping: Goal: Level of anxiety will decrease Outcome: Not Progressing   Problem: Elimination: Goal: Will not experience complications related to bowel motility Outcome: Not Progressing Goal: Will not experience complications related to urinary retention Outcome: Not Progressing   Problem: Pain Managment: Goal: General experience of comfort will improve and/or be controlled Outcome: Progressing   Problem: Safety: Goal: Ability to remain free from injury will improve Outcome: Not Progressing   Problem: Skin Integrity: Goal: Risk for impaired skin integrity will decrease Outcome: Not Progressing   Problem: Education: Goal: Ability to describe self-care measures that may prevent or decrease complications (Diabetes Survival Skills Education) will improve Outcome: Not Progressing Goal: Individualized Educational Video(s) Outcome: Not Progressing   Problem: Coping: Goal: Ability to adjust to condition or change in health will improve Outcome: Not Progressing   Problem: Fluid Volume: Goal: Ability to maintain a balanced intake and output will improve Outcome: Not Progressing   Problem: Health Behavior/Discharge Planning: Goal: Ability to identify and utilize available resources and services will improve Outcome: Not Progressing Goal: Ability to manage health-related needs will improve Outcome: Not Progressing   Problem: Metabolic: Goal: Ability to maintain appropriate glucose levels will improve Outcome: Progressing   Problem: Nutritional: Goal: Maintenance of adequate nutrition will  improve Outcome: Not Progressing Goal: Progress toward achieving an optimal weight will improve Outcome: Not Progressing   Problem: Skin Integrity: Goal: Risk for impaired skin integrity will decrease Outcome: Progressing   Problem: Tissue Perfusion: Goal: Adequacy of tissue perfusion will improve Outcome: Progressing

## 2023-07-15 NOTE — TOC Initial Note (Signed)
 Transition of Care [facility]) - Initial/Assessment Note    Patient Details  Name: Kathryn Mcguire MRN: [id number] Date of Birth: May 11, 1950  Transition of Care [facility]) CM/SW Contact:    Andrez JULIANNA George, RN Phone Number: 07/15/2023, 11:14 AM  Clinical Narrative:                  CM met with the patient and her middle daughter. Pt lives with her spouse and youngest daughter.  No DME.  Daughter manages her medications and provides needed transportation. PCP: [employer]. Awaiting CIR work up. TOC following.  Expected Discharge Plan: IP Rehab Facility Barriers to Discharge: Continued Medical Work up   Patient Goals and CMS Choice   CMS Medicare.gov Compare Post Acute Care list provided to:: Patient Represenative (must comment) Choice offered to / list presented to : Adult Children      Expected Discharge Plan and Services   Discharge Planning Services: CM Consult Post Acute Care Choice: IP Rehab Living arrangements for the past 2 months: Single Family Home                                      Prior Living Arrangements/Services Living arrangements for the past 2 months: Single Family Home Lives with:: Adult Children Patient language and need for interpreter reviewed:: Yes          Care giver support system in place?: Yes (comment) Current home services: DME (walker) Criminal Activity/Legal Involvement Pertinent to Current Situation/Hospitalization: No - Comment as needed  Activities of Daily Living      Permission Sought/Granted                  Emotional Assessment Appearance:: Appears stated age   Affect (typically observed): Quiet     Psych Involvement: No (comment)  Admission diagnosis:  Stroke (cerebrum) (HCC) [I63.9] Acute CVA (cerebrovascular accident) [facility]) [I63.9] Patient Active Problem List   Diagnosis Date Noted   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   Xanthelasma of eyelid, bilateral 01/13/2019   Callus 01/13/2019   Chronic  left-sided low back pain without sciatica 01/13/2019   Tobacco abuse disorder 01/13/2019   Essential hypertension 02/04/2017   Self neglect 02/04/2017   Encounter for psychiatric assessment 12/30/2016   Hypertension 12/21/2016   Type 2 diabetes mellitus without complications (HCC) 12/21/2016   Patient's other noncompliance with medication regimen 06/08/2014   Bipolar 1 disorder, depressed (HCC) 01/18/2014   Hyperglycemia    Noncompliance with medication regimen    Psychiatric illness    Long-term insulin  use (HCC) 01/07/2014   Vitamin D  deficiency 01/06/2014   History of adenomatous polyps of colon 10/15/2013   Abnormal LFTs 08/25/2013   Transaminitis 06/17/2013   Depression 06/15/2013   Severe bipolar I disorder, most recent episode mixed, with psychotic features, mood-incongruent (HCC) 06/15/2013   Dyslipidemia 04/20/2013   GERD (gastroesophageal reflux disease) 04/20/2013   Osteoporosis 04/20/2013   COPD (chronic obstructive pulmonary disease) (HCC) 06/16/2012   S/P colonoscopic polypectomy 06/16/2012   PCP:  Leonarda [name], NP Pharmacy:   [id number] [address] [place], [place] - [address] [place] [address] Phone: [phone] Fax: [phone]     Social Drivers of Health (SDOH) Social History: SDOH Screenings   Depression (PHQ2-9): Low Risk  (05/07/2023)  Tobacco Use: High Risk (07/11/2023)   SDOH Interventions:     Readmission Risk Interventions     No  data to display

## 2023-07-15 NOTE — Care Management Important Message (Signed)
 Important Message  Patient Details  Name: Kathryn Mcguire MRN: [id number] Date of Birth: 07-Feb-1950   Important Message Given:  Yes - Medicare IM     Claretta Deed 07/15/2023, 3:15 PM

## 2023-07-15 NOTE — Progress Notes (Signed)
 Physical Therapy Treatment Patient Details Name: Kathryn Mcguire MRN: [id number] DOB: [birth year] Today's Date: 07/15/2023   History of Present Illness 73 y.o. female arrives 7/3 to [facility] ED presenting symptoms of left side weakness, decreased sensation, facial droop, gaze preference, and left neglect. NIH=20; CT Scans show suspicions of R ICA occlusion. S/p TNK. Pt has a hx of bipolar, dementia, DM, HTN, HLD, COPD.    PT Comments  Pt received in supine and agreeable to session. Pt requires frequent stimulation for participation and increased cues to follow commands. Pt demonstrates spontaneous movement of LLE, but unable to follow commands for purposeful movement, suspect due to L inattention. Pt able to indicate therapist touching L leg, but unable to accurately state what part of the LE touched. Pt requires grossly mod A +2 for all mobility tasks due to impaired balance, strength, and sequencing. Pt able to step with RLE, but unable to advance LLE without assist. Pt continues to benefit from PT services to progress toward functional mobility goals.     If plan is discharge home, recommend the following: Two people to help with walking and/or transfers;Assistance with feeding;Direct supervision/assist for medications management;Direct supervision/assist for financial management;Assist for transportation;Help with stairs or ramp for entrance;Supervision due to cognitive status   Can travel by private vehicle        Equipment Recommendations  Wheelchair (measurements PT);Wheelchair cushion (measurements PT)    Recommendations for Other Services       Precautions / Restrictions Precautions Precautions: Fall;Other (comment) Recall of Precautions/Restrictions: Impaired Precaution/Restrictions Comments: LUE hemi Restrictions Weight Bearing Restrictions Per Provider Order: No     Mobility  Bed Mobility Overal bed mobility: Needs Assistance Bed Mobility: Rolling, Sidelying to  Sit Rolling: Min assist, Used rails Sidelying to sit: Mod assist, HOB elevated, Used rails       General bed mobility comments: mod A to raise trunk and advance BLE to EOB, cues needed to sequence properly.    Transfers Overall transfer level: Needs assistance Equipment used: 2 person hand held assist Transfers: Sit to/from Stand, Bed to chair/wheelchair/BSC Sit to Stand: Mod assist, +2 physical assistance   Step pivot transfers: Mod assist, +2 physical assistance, +2 safety/equipment       General transfer comment: needs heavy mod A+2 to transfer and weight shift to offload and andvance RLE. LLE hyperextended in standing and unable to advance without assist    Ambulation/Gait                   Stairs             Wheelchair Mobility     Tilt Bed    Modified Rankin (Stroke Patients Only) Modified Rankin (Stroke Patients Only) Pre-Morbid Rankin Score: Moderate disability Modified Rankin: Severe disability     Balance Overall balance assessment: Needs assistance Sitting-balance support: No upper extremity supported, Feet supported Sitting balance-Leahy Scale: Fair Sitting balance - Comments: CGA sitting EOB   Standing balance support: Bilateral upper extremity supported, During functional activity Standing balance-Leahy Scale: Poor Standing balance comment: reliant on ext support.                            Communication Communication Communication: Impaired Factors Affecting Communication: Reduced clarity of speech  Cognition Arousal: Lethargic Behavior During Therapy: Flat affect   PT - Cognitive impairments: History of cognitive impairments  Following commands: Impaired Following commands impaired: Follows one step commands inconsistently, Follows one step commands with increased time    Cueing Cueing Techniques: Verbal cues, Gestural cues, Tactile cues, Visual cues  Exercises      General  Comments        Pertinent Vitals/Pain Pain Assessment Pain Assessment: No/denies pain     PT Goals (current goals can now be found in the care plan section) Acute Rehab PT Goals Patient Stated Goal: unable to state; agrees with getting more independent PT Goal Formulation: With patient Time For Goal Achievement: 07/27/23 Progress towards PT goals: Progressing toward goals    Frequency    Min 3X/week      PT Plan      Co-evaluation PT/OT/SLP Co-Evaluation/Treatment: Yes Reason for Co-Treatment: Complexity of the patient's impairments (multi-system involvement);For patient/therapist safety;To address functional/ADL transfers PT goals addressed during session: Mobility/safety with mobility;Balance;Strengthening/ROM OT goals addressed during session: Strengthening/ROM;ADL's and self-care      AM-PAC PT 6 Clicks Mobility   Outcome Measure  Help needed turning from your back to your side while in a flat bed without using bedrails?: A Lot Help needed moving from lying on your back to sitting on the side of a flat bed without using bedrails?: A Lot Help needed moving to and from a bed to a chair (including a wheelchair)?: Total Help needed standing up from a chair using your arms (e.g., wheelchair or bedside chair)?: Total Help needed to walk in hospital room?: Total Help needed climbing 3-5 steps with a railing? : Total 6 Click Score: 8    End of Session Equipment Utilized During Treatment: Gait belt Activity Tolerance: Patient tolerated treatment well Patient left: in chair;with call bell/phone within reach;with chair alarm set;with family/visitor present Nurse Communication: Mobility status PT Visit Diagnosis: Hemiplegia and hemiparesis Hemiplegia - Right/Left: Left Hemiplegia - dominant/non-dominant: Non-dominant Hemiplegia - caused by: Cerebral infarction     Time: 8860-8786 PT Time Calculation (min) (ACUTE ONLY): 34 min  Charges:    $Therapeutic Activity:  8-22 mins PT General Charges $$ ACUTE PT VISIT: 1 Visit                     Darryle George, PTA Acute Rehabilitation Services Secure Chat Preferred  [phone]    Darryle George 07/15/2023, 1:00 PM

## 2023-07-15 NOTE — Progress Notes (Addendum)
 1300 assumed care of patient patient in chair alert x2 on room air daughter at bedside, was report Cortac was pulled out by patient on arrival no Cortac was in place

## 2023-07-15 NOTE — Progress Notes (Addendum)
 Nutrition Follow Up  DOCUMENTATION CODES:   Non-severe (moderate) malnutrition in context of chronic illness (dementia)  INTERVENTION:  D/c Cortrak order, d/c tube feeds Pt eating well, 83% average intake over 3 meals Pt was receiving nocturnal feeds over weekend, but no longer needed Continue to work with SLP and monitor diet advancements Continue adequate intake of DYS 1 to help pt meet calorie and protein needs Ensure Plus High Protein po BID, each supplement provides 350 kcal and 20 grams of protein.  NUTRITION DIAGNOSIS:   Moderate Malnutrition related to chronic illness (dementia) as evidenced by moderate muscle depletion, mild fat depletion.  GOAL:   Patient will meet greater than or equal to 90% of their needs Progressing  MONITOR:   PO intake, Supplement acceptance  REASON FOR ASSESSMENT:   Consult, Ventilator Enteral/tube feeding initiation and management  ASSESSMENT:   73 yo female admitted as Code Stroke (activated by EMS) with Left sided weakness and facial droop with acute R MCA ischemic infarct. PMH includes dementia, DM, HTN, COPD, bipolar disorder, GERD, bipolar depression. At baseline, pt requires assistance with ADLs. Pt lives with husband, her daughter and grandaughter per H&P.  7/03 Admitted, CT head no acute process, CTA head/neck with R ICA occlusion-family declined revascularization, received TNK 7/04 NG tube placed, TF initiated by MD via Adult TF protocol 7/05 Tube feeds switched to Osmolite 1.2 @55ml /h over 24 h, transferred out of ICU 7/06 MBS conducted, SLP recommends DYS 1 w/ supervision; MD switched tube feeds to nocturnal: Osmolite 1.2 @75ml /h over 10 h (8pm-6am) 7/07 continued therapy with SLP, good PO intake, d/c NG and tube feeds  Pt completed MBS yesterday 7/6, was recommended DYS 1 diet with supervision and MD ordered tube feeds to be switched to nocturnal to monitor improvements in PO intake. Per chart review, pt ate 100% of two meals  yesterday 7/6. MD recommends d/c tube feeds and NG removal, agree and will continue to monitor PO intake and diet advancements. Pt awaiting CIR workup for discharge   Spoke with pt who was awake and alert. Pt's daughter at bedside. Per daughter and RN, pt eating very well. Pt ate 100% of lunch and dinner 7/6 and 50% of breakfast today. Pt's daughter reports pt felt more full this morning with the nocturnal feeds which is why she did not eat more. Discussed talking with SLP and MD about NG/Cortrak and nocturnal feeds. Confident pt will continue to meet needs PO as diet continues to advance per speech recommendations. No longer feel like nocturnal feeds necessary, MD agrees to discontinue. Pt reports no issues with GI discomforts at this time. Conducted nutrition focused physical exam and shows mild fat depletions and mild to moderate muscle depletions, which is indicative of malnutrition. Pt's daughter reports pt's appetite has decreased over the last few years with progression of dementia and reports pt was previously living at home with another daughter and pt's husband. Other daughter was primary caregiver for pt and would remind pt to eat throughout the day but some days it was difficult for pt to eat. Suspect malnutrition is related to dementia. Discussed adding ONS to help intake continue to meet calorie and protein needs.  Medications reviewed and include:  Novolog  SSI q4 hours Protonix   Labs reviewed:  CBG over last 24 hours: 190-415mg /dL Sodium 866  NUTRITION - FOCUSED PHYSICAL EXAM:  Flowsheet Row Most Recent Value  Orbital Region Mild depletion  Upper Arm Region Mild depletion  Thoracic and Lumbar Region No depletion  Buccal  Region Mild depletion  Temple Region Mild depletion  Clavicle Bone Region Mild depletion  Clavicle and Acromion Bone Region Mild depletion  Scapular Bone Region Mild depletion  Dorsal Hand Mild depletion  Patellar Region Moderate depletion  Anterior Thigh  Region Moderate depletion  Posterior Calf Region Moderate depletion  Edema (RD Assessment) None  Hair Reviewed  Eyes Reviewed  Mouth Reviewed  Skin Reviewed  Nails Reviewed   Diet Order:   Diet Order             DIET - DYS 1 Room service appropriate? No; Fluid consistency: Thin  Diet effective now                   EDUCATION NEEDS:   Education needs have been addressed  Skin:  Skin Assessment: Reviewed RN Assessment  Last BM:  7/5  Height:   Ht Readings from Last 1 Encounters:  07/11/23 5' 3 (1.6 m)   Weight:   Wt Readings from Last 1 Encounters:  07/14/23 55.7 kg    BMI:  Body mass index is 21.75 kg/m.  Estimated Nutritional Needs:   Kcal:  1450-1650 kcals  Protein:  65-75 g  Fluid:  >/= 1.6 L   Josette Glance, MS, RDN, LDN Clinical Dietitian I Please reach out via secure chat

## 2023-07-15 NOTE — Inpatient Diabetes Management (Signed)
 Inpatient Diabetes Program Recommendations  AACE/ADA: New Consensus Statement on Inpatient Glycemic Control (2015)  Target Ranges:  Prepandial:   less than 140 mg/dL      Peak postprandial:   less than 180 mg/dL (1-2 hours)      Critically ill patients:  140 - 180 mg/dL   Lab Results  Component Value Date   GLUCAP 251 (H) 07/15/2023   HGBA1C 12.3 (H) 07/11/2023    Review of Glycemic Control  Diabetes history: DM2 Outpatient Diabetes medications: Novolin  70/30 6 units BID, metformin  1000 mg BID Current orders for Inpatient glycemic control: Novolog  0-15 Q4H  HgbA1C - 12.3% CBGs 190, 257, 251 today   Inpatient Diabetes Program Recommendations:    Consider adding Semglee  5 units at bedtime  Will continue to follow glucose trends.   Thank you. Shona Brandy, RD, LDN, CDCES Inpatient Diabetes Coordinator [phone]

## 2023-07-15 NOTE — PMR Pre-admission (Shared)
 PMR Admission Coordinator Pre-Admission Assessment  Patient: Kathryn Mcguire is an 73 y.o., female MRN: [id number] DOB: 1950/02/14 Height: 5' 3 (160 cm) Weight: 55.7 kg         Insurance Information HMO: yes    PPO:      PCP:      IPA:      80/20:      OTHER:  PRIMARY: [employer] Medicare Policy#: [id number]  Gold plus, group # [id number]    Subscriber: Patient; Medicare [id number] CM Name: Leita Dus    Phone#: [phone]  ext [phone]   Fax#: [phone] Pre-Cert#: [id number]      Employer:  Benefits:  Phone #: [phone]     Name: 7/7 Eff. Date: 01/09/2023-01/08/2024     Deduct: does not have      Out of Pocket Max: $6,750 ( $0 met)      CIR: $399/day co pay with max co pay of $2793/admission ( 7 days)      SNF: $10/day co pay for days 1-20 Outpatient: $25/visit co pay      Home Health: 100% coverage      DME: 80% coverage, 20% co pay       The "Data Collection Information Summary" for patients in Inpatient Rehabilitation Facilities with attached "Privacy Act Statement-Health Care Records" was provided and verbally reviewed with: Family  Emergency Contact Information Contact Information     Name Relation Home Work [place] Daughter   [phone]      Other Contacts     Name Relation Home Work Mobile   Dicaprio,[name] Daughter   [phone]      Current Medical History  Patient Admitting Diagnosis: CVA  History of Present Illness: 73 y.o. female with history of bi[olar, dementia, DM, HTN, HLD, COPD arrives 07/11/23 to [facility] ED presenting symptoms of left side weakness, decreased sensation, facial droop, gaze preference, and left neglect.   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 Family declined interventional radiology given her baseline functional status.   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 CTA head & neck occlusion of the right ICA from proximal cervical segment to cavernous segment with reconstitution of para  clinoid segment likely via circle of Willis, decreased caliber and number of right MCA branches, decreased caliber in number of right ACA branches, severe stenosis at the origin of the left nondominant VA, multifocal stenosis and irregularity of the left V2 segment, likely chronic occlusion of left V4 segment after the origin of the left PICA, moderate stenosis of right PCA P2 segment, moderate stenosis of the origin of the left ICA    No antithrombotics prior to admit. Now on ASA and Plavix  DAPT for 3 months and then ASA alone. Need to avoid low BP given R ICA occlusion. ON atorvastatin  prior to admit with LDL 110. Increased dosage. Hgb A1c 12.3 and on insulin  and metformin  at home. CBGS and SSI. Recommend close follow up with PCP for better DM control. Patient chews large amounts of tobacco. Discussed cessation with patient and family. Initially on cortrak and nocturnal TF. Have d/c'd TF for eating adequately now. Resumed home Aricept  for mild dementia.   Complete NIHSS TOTAL: 15 Glasgow Coma Scale Score: 14  Patient's medical record from [facility] has been reviewed by the rehabilitation admission coordinator and physician.  Past Medical History  Past Medical History:  Diagnosis Date   Arthritis    Bipolar depression (HCC)    COPD (chronic obstructive pulmonary  disease) (HCC)    Dementia (HCC)    Diabetes mellitus without complication (HCC)    Elevated LFTs    Emphysema of lung (HCC)    GERD (gastroesophageal reflux disease)    History of adenomatous polyps of colon 10/15/2013   Hyperlipemia    Hypertension    UTI (lower urinary tract infection)    Has the patient had major surgery during 100 days prior to admission? No  Family History  family history includes Bursitis in her daughter; Diabetes in her daughter and mother; Lung cancer in her sister; Lupus in her daughter; Migraines in her daughter.  Current Medications   Current Facility-Administered Medications:    acetaminophen   (TYLENOL ) tablet 650 mg, 650 mg, Oral, Q4H PRN, 650 mg at 07/15/23 0850 **OR** acetaminophen  (TYLENOL ) 160 MG/5ML solution 650 mg, 650 mg, Per Tube, Q4H PRN **OR** acetaminophen  (TYLENOL ) suppository 650 mg, 650 mg, Rectal, Q4H PRN, Waddell Aquas A, NP   aspirin  chewable tablet 81 mg, 81 mg, Per Tube, Daily, Cyndy Ozell DASEN, RPH, 81 mg at 07/15/23 0850   atorvastatin  (LIPITOR) tablet 40 mg, 40 mg, Per Tube, Daily, Bitonti, Michael T, RPH, 40 mg at 07/15/23 9149   Chlorhexidine  Gluconate Cloth 2 % PADS 6 each, 6 each, Topical, Daily, Michaela Aisha SQUIBB, MD, 6 each at 07/13/23 1136   clopidogrel  (PLAVIX ) tablet 75 mg, 75 mg, Per Tube, Daily, Bitonti, Michael T, RPH, 75 mg at 07/15/23 9149   donepezil  (ARICEPT ) tablet 5 mg, 5 mg, Per Tube, QHS, Jerri Pfeiffer, MD, 5 mg at 07/14/23 2222   enoxaparin  (LOVENOX ) injection 40 mg, 40 mg, Subcutaneous, Q24H, Jerri Pfeiffer, MD, 40 mg at 07/14/23 2222   feeding supplement (ENSURE PLUS HIGH PROTEIN) liquid 237 mL, 237 mL, Oral, BID BM, Jerri Pfeiffer, MD   insulin  aspart (novoLOG ) injection 0-15 Units, 0-15 Units, Subcutaneous, Q4H, Michaela Aisha SQUIBB, MD, 8 Units at 07/15/23 1223   labetalol  (NORMODYNE ) injection 10-20 mg, 10-20 mg, Intravenous, Q2H PRN, Waddell Aquas A, NP, 20 mg at 07/11/23 1505   pantoprazole  (PROTONIX ) injection 40 mg, 40 mg, Intravenous, QHS, Waddell Aquas A, NP, 40 mg at 07/14/23 2222   phenylephrine  (NEO-SYNEPHRINE) 20mg /NS 250mL premix infusion, 0-400 mcg/min, Intravenous, Titrated, Michaela Aisha SQUIBB, MD, Stopped at 07/12/23 1214   senna-docusate (Senokot-S) tablet 1 tablet, 1 tablet, Oral, QHS PRN, Waddell Aquas LABOR, NP  Patients Current Diet:  Diet Order             DIET - DYS 1 Room service appropriate? No; Fluid consistency: Thin  Diet effective now                  Precautions / Restrictions Precautions Precautions: Fall, Other (comment) Precaution/Restrictions Comments: LUE hemi Restrictions Weight Bearing  Restrictions Per Provider Order: No   Has the patient had 2 or more falls or a fall with injury in the past year?No  Prior Activity Level    Prior Functional Level Prior Function Prior Level of Function : Needs assist Mobility Comments: ambulated no device ADLs Comments: cues to complete ADLs 2/2 dementia  Self Care: Did the patient need help bathing, dressing, using the toilet or eating?  Independent  Indoor Mobility: Did the patient need assistance with walking from room to room (with or without device)? Independent  Stairs: Did the patient need assistance with internal or external stairs (with or without device)? Independent  Functional Cognition: Did the patient need help planning regular tasks such as shopping or remembering to take medications? Independent  Patient Information Are you of Hispanic, Latino/a,or Spanish origin?: A. No, not of Hispanic, Latino/a, or Spanish origin What is your race?: B. Black or African American Do you need or want an interpreter to communicate with a doctor or health care staff?: 0. No  Patient's Response To:  Health Literacy and Transportation Is the patient able to respond to health literacy and transportation needs?: No Health Literacy - How often do you need to have someone help you when you read instructions, pamphlets, or other written material from your doctor or pharmacy?: Patient unable to respond  Home Assistive Devices / Equipment Home Equipment: Agricultural consultant (2 wheels), Grab bars - tub/shower  Prior Device Use: Indicate devices/aids used by the patient prior to current illness, exacerbation or injury? None of the above  Current Functional Level Cognition  Arousal/Alertness: Awake/alert Overall Cognitive Status: No family/caregiver present to determine baseline cognitive functioning Orientation Level: Oriented to person Attention: Sustained Sustained Attention: Impaired Sustained Attention Impairment: Verbal basic Memory:  Impaired Memory Impairment: Decreased recall of new information Awareness: Impaired Awareness Impairment: Other (comment) (online) Comments: L inattention    Extremity Assessment (includes Sensation/Coordination)  Upper Extremity Assessment: LUE deficits/detail LUE Deficits / Details: flaccid, no AROM. able to located it when prompted. LUE Sensation: decreased light touch LUE Coordination: decreased fine motor, decreased gross motor  Lower Extremity Assessment: Defer to PT evaluation LLE Deficits / Details: knee extension 2+ in sitting; ankle DF 2+; required assist to advance LLE however did not buckle in wt-bearing LLE Sensation: WNL    ADLs  Overall ADL's : Needs assistance/impaired Eating/Feeding: Set up, Sitting Eating/Feeding Details (indicate cue type and reason): with RUE Grooming: Moderate assistance, Sitting Grooming Details (indicate cue type and reason): apply lotion to LUE, needs some assist to squeeze bottle. Upper Body Bathing: Moderate assistance, Sitting Lower Body Bathing: Maximal assistance, Sit to/from stand, Sitting/lateral leans Upper Body Dressing : Maximal assistance, Sitting, Cueing for sequencing Upper Body Dressing Details (indicate cue type and reason): doff/don gowns, cues to sequence first in and last out. Lower Body Dressing: Maximal assistance, Bed level (donning socks) Toilet Transfer: Moderate assistance, +2 for physical assistance, +2 for safety/equipment, Stand-pivot Functional mobility during ADLs: Moderate assistance, +2 for physical assistance, +2 for safety/equipment (pivot reclienr\)    Mobility  Overal bed mobility: Needs Assistance Bed Mobility: Rolling, Sidelying to Sit Rolling: Min assist, Used rails Sidelying to sit: Mod assist, HOB elevated, Used rails General bed mobility comments: mod A to raise trunk and advance BLE to EOB, cues needed to sequence properly.    Transfers  Overall transfer level: Needs assistance Equipment used: 2  person hand held assist Transfers: Sit to/from Stand, Bed to chair/wheelchair/BSC Sit to Stand: Mod assist, +2 physical assistance Bed to/from chair/wheelchair/BSC transfer type:: Step pivot Step pivot transfers: Mod assist, +2 physical assistance, +2 safety/equipment General transfer comment: needs heavy mod A+2 to transfer and weight shift to offload and andvance RLE. LLE hyperextended in standing and unable to advance without assist    Ambulation / Gait / Stairs / Wheelchair Mobility  Ambulation/Gait Pre-gait activities: pivotal steps only    Posture / Balance Dynamic Sitting Balance Sitting balance - Comments: CGA sitting EOB Balance Overall balance assessment: Needs assistance Sitting-balance support: No upper extremity supported, Feet supported Sitting balance-Leahy Scale: Fair Sitting balance - Comments: CGA sitting EOB Postural control: Left lateral lean Standing balance support: Bilateral upper extremity supported, During functional activity Standing balance-Leahy Scale: Poor Standing balance comment: reliant on ext support.  Special needs/care consideration Hgb A1c 12.3     Previous Home Environment  Living Arrangements: Children  Lives With: Family Available Help at Discharge: Family, Available 24 hours/day Type of Home: House Home Layout: Two level Alternate Level Stairs-Rails: None Alternate Level Stairs-Number of Steps: 2 (between back entrance and kitchen) Home Access: Stairs to enter Entrance Stairs-Rails: None Entrance Stairs-Number of Steps: 2 Bathroom Shower/Tub: Health visitor: Standard Additional Comments: information provided by daughter  Discharge Living Setting Plans for Discharge Living Setting: Patient's home, Lives with (comment) (family) Type of Home at Discharge: House Discharge Home Layout: Two level Discharge Home Access: Stairs to enter Entrance Stairs-Rails: None Entrance Stairs-Number of Steps: 2 Discharge Bathroom  Shower/Tub: Walk-in shower Discharge Bathroom Toilet: Standard Does the patient have any problems obtaining your medications?: No  Social/Family/Support Systems Anticipated Caregiver: Edsel, daughter Anticipated Industrial/product designer Information: [phone] Caregiver Availability: 24/7 Discharge Plan Discussed with Primary Caregiver: Yes Is Caregiver In Agreement with Plan?: Yes Does Caregiver/Family have Issues with Lodging/Transportation while Pt is in Rehab?: No  Goals Patient/Family Goal for Rehab: min assist to sup PT, OT, supervision/min SLP Expected length of stay: 17-23 days Pt/Family Agrees to Admission and willing to participate: Yes Program Orientation Provided & Reviewed with Pt/Caregiver Including Roles  & Responsibilities: Yes  Barriers to Discharge: Insurance for SNF coverage  Decrease burden of Care through IP rehab admission: n/a  Possible need for SNF placement upon discharge:not anticipated  Patient Condition: This patient's medical and functional status has not changed since the consult dated 07/15/23 in which the Rehabilitation Physician determined and documented that the patient was potentially appropriate for intensive rehabilitative care in an inpatient rehabilitation facility. Issues have been addressed and update has been discussed with Dr. Babs and patient now appropriate for inpatient rehabilitation. Will admit to inpatient rehab today.   Preadmission Screen Completed By:  Heron Leavell RN MSN, 07/15/2023 2:26 PM ______________________________________________________________________   Discussed status with Dr. Babs on 07/16/23 at 1317 and received approval for admission today.  Admission Coordinator:  Heron Leavell RN MSN, time [id number] Date 07/16/23

## 2023-07-15 NOTE — Progress Notes (Addendum)
 Physical Medicine and Rehabilitation Consult Reason for Consult: Left-sided weakness and impaired functional mobility Referring Physician: Jerri   HPI: Kathryn Mcguire is a 73 y.o. female with a history of hyper tension, diabetes, COPD who was admitted on 07/11/2023 with acute onset left-sided weakness, left facial droop, and right gaze preference.  Patient was given TNK and CT initially showed no acute abnormalities.  CTA of the head and neck showed occlusion of the right ICA from proximal cervical segment to cavernous segment with reconstitution of the paraclinoid segment likely via circle of Willis as well as decreased caliber number right MCA branches, decreased caliber in number of right ACA branches, severe stenosis at the origin of the left nondominant vertebral artery, multiple stenoses and irregularities of the left V2 segment, likely chronic occlusion of left V4 segment after the origin of the left PICA, moderate stenosis of the right PCA P2 segment, moderate stenosis of the origin of the left ICA.  MRI demonstrated scattered infarcts throughout the right MCA and right ACA territories.  There were additional punctate areas of acute infarct in the left basal ganglia and within the posterior aspect of the left ACA territory.  Neurology felt that this was a large vessel event.  Patient was placed on aspirin  and Plavix  for 3 months then aspirin  alone is recommended.  Avoidance of lower blood pressures given significant right ICA occlusion.  Hemoglobin A1c at admission was 12.3.  Patient on D1 nectar thick liquid diet per speech-language pathology with nocturnal tube feeds.  Other issues include mild dementia, COPD, persistent mild leukocytosis and mild hyponatremia.  Patient worked with therapies over the weekend and was max assist for basic transfers and mobility.  Only pre-gait activities were attempted with pivot steps only.  Patient's required mod to max assist for basic ADLs.  Patient lives  with his children, daughter is his caregiver.  They live in a two-level house with 2 steps to enter.  Patient does have a rolling walker but was ambulating without device prior to admission.  She did need some cueing to complete ADLs due to dementia.    Home: Home Living Family/patient expects to be discharged to:: Private residence Living Arrangements: Children (daughter is caregiver) Available Help at Discharge: Family, Available 24 hours/day Type of Home: House Home Access: Stairs to enter [employer] of Steps: 2 (back entrance) Entrance Stairs-Rails: None Home Layout: Two level Alternate Level Stairs-Number of Steps: 2 (between back entrance and kitchen) Alternate Level Stairs-Rails: None Bathroom Shower/Tub: Health visitor: Standard Home Equipment: Agricultural consultant (2 wheels), Grab bars - tub/shower Additional Comments: information provided by daughter  Functional History: Prior Function Prior Level of Function : Needs assist Mobility Comments: ambulated no device ADLs Comments: cues to complete ADLs 2/2 dementia Functional Status:  Mobility: Bed Mobility Overal bed mobility: Needs Assistance Bed Mobility: Rolling, Sidelying to Sit Rolling: Max assist Sidelying to sit: Max assist General bed mobility comments: rolled to left; assist for advancing torso throughout Transfers Overall transfer level: Needs assistance Equipment used: 2 person hand held assist Transfers: Sit to/from Stand, Bed to chair/wheelchair/BSC Sit to Stand: Mod assist, +2 physical assistance Bed to/from chair/wheelchair/BSC transfer type:: Step pivot Step pivot transfers: Max assist, +2 physical assistance General transfer comment: stepping to chair on her left required total assist to advance LLE, however did not buckle in wt-bearing; assist for balance when advancing each LE Ambulation/Gait Pre-gait activities: pivotal steps only    ADL: ADL Overall ADL's :  Needs  assistance/impaired Eating/Feeding: NPO Grooming: Moderate assistance, Sitting Upper Body Bathing: Moderate assistance, Sitting Lower Body Bathing: Maximal assistance, Sit to/from stand, Sitting/lateral leans Upper Body Dressing : Maximal assistance, Sitting Lower Body Dressing: Maximal assistance, Bed level (donning socks) Toilet Transfer: Moderate assistance, +2 for physical assistance, +2 for safety/equipment, Stand-pivot Functional mobility during ADLs: Moderate assistance, +2 for physical assistance, +2 for safety/equipment  Cognition: Cognition Overall Cognitive Status: No family/caregiver present to determine baseline cognitive functioning Arousal/Alertness: Awake/alert Orientation Level: Oriented to person Attention: Sustained Sustained Attention: Impaired Sustained Attention Impairment: Verbal basic Memory: Impaired Memory Impairment: Decreased recall of new information Awareness: Impaired Awareness Impairment: Other (comment) (online) Comments: L inattention Cognition Arousal: Lethargic Behavior During Therapy: Flat affect Overall Cognitive Status: No family/caregiver present to determine baseline cognitive functioning   Review of Systems  Unable to perform ROS: Mental acuity   Past Medical History:  Diagnosis Date   Arthritis    Bipolar depression (HCC)    COPD (chronic obstructive pulmonary disease) (HCC)    Dementia (HCC)    Diabetes mellitus without complication (HCC)    Elevated LFTs    Emphysema of lung (HCC)    GERD (gastroesophageal reflux disease)    History of adenomatous polyps of colon 10/15/2013   Hyperlipemia    Hypertension    UTI (lower urinary tract infection)    Past Surgical History:  Procedure Laterality Date   COLONOSCOPY     ESOPHAGOGASTRODUODENOSCOPY     TONSILLECTOMY     TONSILLECTOMY AND ADENOIDECTOMY     TUBAL LIGATION     Family History  Problem Relation Age of Onset   Diabetes Mother    Lung cancer Sister    Diabetes  Daughter    Lupus Daughter    Migraines Daughter        chronic   Bursitis Daughter    Colon cancer Neg Hx    Stomach cancer Neg Hx    Esophageal cancer Neg Hx    Rectal cancer Neg Hx    Social History:  reports that she has been smoking cigarettes. Her smokeless tobacco use includes chew. She reports that she does not currently use alcohol. She reports that she does not use drugs. Allergies: No Known Allergies Medications Prior to Admission  Medication Sig Dispense Refill   acetaminophen  (TYLENOL ) 325 MG tablet Take 2 tablets (650 mg total) by mouth every 6 (six) hours as needed for moderate pain. 30 tablet 0   atorvastatin  (LIPITOR) 20 MG tablet TAKE 1 TABLET BY MOUTH EVERY DAY (Patient taking differently: Take 20 mg by mouth every evening.) 90 tablet 1   donepezil  (ARICEPT ) 5 MG tablet TAKE ONE TABLET MOUTH DAILY X 1 MONTH THEN INCREASE TO 2 TABLET DAILY. (Patient taking differently: Take 5 mg by mouth at bedtime.) 180 tablet 1   Glucerna (GLUCERNA) LIQD Take 237 mLs by mouth 2 (two) times daily between meals. 1500 mL 3   insulin  NPH-regular Human (NOVOLIN  70/30) (70-30) 100 UNIT/ML injection Inject 6 Units into the skin 2 (two) times daily with a meal. 30 mL 1   metFORMIN  (GLUCOPHAGE ) 1000 MG tablet TAKE 1 TABLET (1,000 MG TOTAL) BY MOUTH TWICE A DAY WITH FOOD 180 tablet 1   aspirin  EC 81 MG tablet Take 1 tablet (81 mg total) by mouth daily. Swallow whole. (Patient not taking: Reported on 05/07/2023) 90 tablet 0   Blood Glucose Monitoring Suppl (ONE TOUCH ULTRA 2) w/Device KIT USE TO TEST BLOOD SUGAR ONCE DAILY 1 kit 0  glucose blood (ONETOUCH ULTRA) test strip USE TO TEST BLOOD SUGAR ONCE DAILY 100 strip 1   Insulin  Pen Needle 32G X 4 MM MISC 1 Device by Does not apply route in the morning and at bedtime. 100 each 3   Lancets (ONETOUCH ULTRASOFT) lancets Use to test blood sugar once daily DX: E11.9 100 each 1     Blood pressure 118/69, pulse 72, temperature 97.7 F (36.5 C), resp.  rate 19, height 5' 3 (1.6 m), weight 55.7 kg, SpO2 98%. Physical Exam Constitutional:      General: She is not in acute distress. HENT:     Head: Normocephalic.     Right Ear: External ear normal.     Left Ear: External ear normal.     Mouth/Throat:     Mouth: Mucous membranes are moist.  Eyes:     Extraocular Movements: Extraocular movements intact.     Pupils: Pupils are equal, round, and reactive to light.  Cardiovascular:     Rate and Rhythm: Normal rate.  Pulmonary:     Effort: Pulmonary effort is normal.  Abdominal:     Palpations: Abdomen is soft.  Musculoskeletal:        General: Swelling present.     Cervical back: Normal range of motion.  Skin:    General: Skin is warm.  Neurological:     Comments: Pt alert, able to tell me her address, where she was, why she was here, month/year. Limited awareness/insight overall. Left central VII with dysarthria. Speech low volume. Language seems intact.  Follows basic commands. Had difficulty getting her to initiate movement with her left arm and leg although left leg was crossed over the right when pulled back her cover. She did withdraw left leg when I pinched. DTR's 1+     Results for orders placed or performed during the hospital encounter of 07/11/23 (from the past 24 hours)  Glucose, capillary     Status: Abnormal   Collection Time: 07/14/23  3:17 PM  Result Value Ref Range   Glucose-Capillary 241 (H) 70 - 99 mg/dL   Comment 1 Notify RN    Comment 2 Document in Chart   Glucose, capillary     Status: Abnormal   Collection Time: 07/14/23  8:16 PM  Result Value Ref Range   Glucose-Capillary 307 (H) 70 - 99 mg/dL   Comment 1 Notify RN    Comment 2 Document in Chart   Glucose, capillary     Status: Abnormal   Collection Time: 07/14/23 10:20 PM  Result Value Ref Range   Glucose-Capillary 415 (H) 70 - 99 mg/dL  Glucose, capillary     Status: Abnormal   Collection Time: 07/15/23 12:09 AM  Result Value Ref Range    Glucose-Capillary 369 (H) 70 - 99 mg/dL   Comment 1 Notify RN    Comment 2 Document in Chart   Glucose, capillary     Status: Abnormal   Collection Time: 07/15/23  3:36 AM  Result Value Ref Range   Glucose-Capillary 190 (H) 70 - 99 mg/dL   Comment 1 Notify RN    Comment 2 Document in Chart   Magnesium     Status: None   Collection Time: 07/15/23  5:00 AM  Result Value Ref Range   Magnesium 1.8 1.7 - 2.4 mg/dL  Phosphorus     Status: None   Collection Time: 07/15/23  5:00 AM  Result Value Ref Range   Phosphorus 3.7 2.5 - 4.6 mg/dL  Basic metabolic panel with GFR     Status: Abnormal   Collection Time: 07/15/23  5:00 AM  Result Value Ref Range   Sodium 133 (L) 135 - 145 mmol/L   Potassium 4.4 3.5 - 5.1 mmol/L   Chloride 99 98 - 111 mmol/L   CO2 22 22 - 32 mmol/L   Glucose, Bld 217 (H) 70 - 99 mg/dL   BUN 21 8 - 23 mg/dL   Creatinine, Ser 9.36 0.44 - 1.00 mg/dL   Calcium  8.9 8.9 - 10.3 mg/dL   GFR, Estimated >39 >39 mL/min   Anion gap 12 5 - 15  CBC     Status: Abnormal   Collection Time: 07/15/23  5:00 AM  Result Value Ref Range   WBC 10.3 4.0 - 10.5 K/uL   RBC 4.40 3.87 - 5.11 MIL/uL   Hemoglobin 11.4 (L) 12.0 - 15.0 g/dL   HCT 64.2 (L) 63.9 - 53.9 %   MCV 81.1 80.0 - 100.0 fL   MCH 25.9 (L) 26.0 - 34.0 pg   MCHC 31.9 30.0 - 36.0 g/dL   RDW 86.2 88.4 - 84.4 %   Platelets 200 150 - 400 K/uL   nRBC 0.0 0.0 - 0.2 %  Glucose, capillary     Status: Abnormal   Collection Time: 07/15/23  7:50 AM  Result Value Ref Range   Glucose-Capillary 257 (H) 70 - 99 mg/dL   Comment 1 Notify RN    Comment 2 Document in Chart    DG Abd 1 View Result Date: 07/14/2023 CLINICAL DATA:  Nasogastric tube placement. EXAM: ABDOMEN - 1 VIEW COMPARISON:  Radiographs 07/14/2023 and 07/13/2023. FINDINGS: 1211 hours. The enteric tube has been advanced, tip overlying the expected location of the mid stomach. There is contrast material within nondistended small bowel. Grossly stable superior endplate  compression deformity at L2. IMPRESSION: Enteric tube tip overlies the expected location of the mid stomach. Electronically Signed   By: Elsie Perone M.D.   On: 07/14/2023 12:31   DG Swallowing Func-Speech Pathology Result Date: 07/14/2023 Table formatting from the original result was not included. Modified Barium Swallow Study Patient Details Name: Kathryn Mcguire MRN: [id number] Date of Birth: 01-Nov-1950 Today's Date: 07/14/2023 HPI/PMH: HPI: 73 y.o. female presented 7/3 with L sided weakness, L facial droop, and R gaze preference s/p TNK. CTA revealed R ICA occlusion. PMH includes: bipolar, dementia, DM, HTN, HLD, COPD. Clinical Impression: Pt presents with a primary oral dysphagia with left sided lingual and facial weakness with additional sensory and attention impairment. Pt has disorganized lingual collection of bolus with passive spillage to the pyriform sinus and modreate oral residuals in the left floor of mouth and buccal cavity. There is an added pharyngeal impairment, likely related to decreased sensation to trigger swallow. Bolus pools in pharynx for 5 to 30 seconds, in one instance leading to sensed aspiration before the swallow with thin liquids. Cough did not immediately eject aspirate, but pt had prolonged coughing that continued to mobilize aspirate to upper trachea. Further trials of straw sips of thin liquids were not aspirated. Pt benefits from cues to swallow twice: to clear oral residue and swallow.  Pt will be at risk of aspiraton of oral residue regardless of texture. Pt demonstrated improvement in timing of airway protection throughout study and would be more likely to mobilize aspirate of thin liquids. Advise initiating pureed solids (pt edentulous with severe oral weakness) and thin liquids, with full supervision and assist needed for oral intake. Focus  on oral hygiene, water  intake, pt awareness and second swallow. If pt coughs, encourage harder coughing for pulmonary hygiene.  Factors that may increase risk of adverse event in presence of aspiration Noe & Lianne 2021): Factors that may increase risk of adverse event in presence of aspiration Noe & Lianne 2021): Dependence for feeding and/or oral hygiene; Reduced cognitive function Recommendations/Plan: Swallowing Evaluation Recommendations Swallowing Evaluation Recommendations Recommendations: PO diet PO Diet Recommendation: Dysphagia 1 (Pureed); Thin liquids (Level 0) Liquid Administration via: Straw Medication Administration: Crushed with puree Supervision: Full assist for feeding; Full supervision/cueing for swallowing strategies Swallowing strategies  : Slow rate; Small bites/sips; Check for pocketing or oral holding; Check for anterior loss; Multiple dry swallows after each bite/sip; Hard cough after swallowing Postural changes: Stay upright 30-60 min after meals; Position pt fully upright for meals Oral care recommendations: Oral care before PO; Oral care QID (4x/day) Caregiver Recommendations: Have oral suction available Treatment Plan Treatment Plan Treatment recommendations: Therapy as outlined in treatment plan below Follow-up recommendations: Acute inpatient rehab (3 hours/day) Treatment frequency: Min 2x/week Treatment duration: 2 weeks Recommendations Recommendations for follow up therapy are one component of a multi-disciplinary discharge planning process, led by the attending physician.  Recommendations may be updated based on patient status, additional functional criteria and insurance authorization. Assessment: Orofacial Exam: Orofacial Exam Oral Cavity: Oral Hygiene: WFL Oral Cavity - Dentition: Edentulous Oral Motor/Sensory Function: Suspected cranial nerve impairment CN V - Trigeminal: Left sensory impairment CN VII - Facial: Left motor impairment CN IX - Glossopharyngeal, CN X - Vagus: Left motor impairment CN XII - Hypoglossal: Left motor impairment Anatomy: Anatomy: WFL Boluses Administered: Boluses  Administered Boluses Administered: Thin liquids (Level 0); Mildly thick liquids (Level 2, nectar thick); Moderately thick liquids (Level 3, honey thick); Puree; Solid  Oral Impairment Domain: Oral Impairment Domain Lip Closure: Escape beyond mid-chin Tongue control during bolus hold: Not tested Bolus preparation/mastication: -- (NT) Bolus transport/lingual motion: Repetitive/disorganized tongue motion; Slow tongue motion Oral residue: Residue collection on oral structures Location of oral residue : Floor of mouth; Lateral sulci Initiation of pharyngeal swallow : Pyriform sinuses  Pharyngeal Impairment Domain: Pharyngeal Impairment Domain Soft palate elevation: No bolus between soft palate (SP)/pharyngeal wall (PW) Laryngeal elevation: Complete superior movement of thyroid  cartilage with complete approximation of arytenoids to epiglottic petiole Anterior hyoid excursion: Complete anterior movement Epiglottic movement: No inversion Laryngeal vestibule closure: Complete, no air/contrast in laryngeal vestibule Pharyngeal stripping wave : Present - complete Pharyngoesophageal segment opening: Complete distension and complete duration, no obstruction of flow Tongue base retraction: Trace column of contrast or air between tongue base and PPW Pharyngeal residue: Trace residue within or on pharyngeal structures Location of pharyngeal residue: Valleculae  Esophageal Impairment Domain: Esophageal Impairment Domain Esophageal clearance upright position: -- (NT) Pill: No data recorded Penetration/Aspiration Scale Score: Penetration/Aspiration Scale Score 1.  Material does not enter airway: Mildly thick liquids (Level 2, nectar thick); Moderately thick liquids (Level 3, honey thick); Puree; Thin liquids (Level 0) 2.  Material enters airway, remains ABOVE vocal cords then ejected out: Thin liquids (Level 0); Mildly thick liquids (Level 2, nectar thick) 7.  Material enters airway, passes BELOW cords and not ejected out despite  cough attempt by patient: Thin liquids (Level 0) Compensatory Strategies: No data recorded  General Information: Caregiver present: No  Diet Prior to this Study: NPO   Temperature : Normal   Respiratory Status: WFL   Supplemental O2: None (Room air)   History of Recent Intubation: No  Behavior/Cognition:  Alert; Cooperative; Requires cueing Self-Feeding Abilities: Needs assist with self-feeding Baseline vocal quality/speech: Not observed No data recorded Volitional Swallow: Able to elicit Exam Limitations: Excessive movement Goal Planning: Prognosis for improved oropharyngeal function: Good Barriers to Reach Goals: Cognitive deficits No data recorded No data recorded Consulted and agree with results and recommendations: Pt unable/family or caregiver not available Pain: Pain Assessment Pain Assessment: No/denies pain Faces Pain Scale: 0 End of Session: Start Time:SLP Start Time (ACUTE ONLY): 1030 Stop Time: SLP Stop Time (ACUTE ONLY): 1050 Time Calculation:SLP Time Calculation (min) (ACUTE ONLY): 20 min Charges: SLP Evaluations $ SLP Speech Visit: 1 Visit SLP Evaluations $MBS Swallow: 1 Procedure $Swallowing Treatment: 1 Procedure SLP visit diagnosis: SLP Visit Diagnosis: Dysphagia, oropharyngeal phase (R13.12) Past Medical History: Past Medical History: Diagnosis Date  Arthritis   Bipolar depression (HCC)   COPD (chronic obstructive pulmonary disease) (HCC)   Dementia (HCC)   Diabetes mellitus without complication (HCC)   Elevated LFTs   Emphysema of lung (HCC)   GERD (gastroesophageal reflux disease)   History of adenomatous polyps of colon 10/15/2013  Hyperlipemia   Hypertension   UTI (lower urinary tract infection)  Past Surgical History: Past Surgical History: Procedure Laterality Date  COLONOSCOPY    ESOPHAGOGASTRODUODENOSCOPY    TONSILLECTOMY    TONSILLECTOMY AND ADENOIDECTOMY    TUBAL LIGATION   Mcguire, Kathryn Fitch 07/14/2023, 11:13 AM  DG Abd 1 View Result Date: 07/14/2023 CLINICAL DATA:  NG tube  placement EXAM: ABDOMEN - 1 VIEW COMPARISON:  07/13/2023 FINDINGS: Limited field of view for tube placement verification purposes. An enteric tube is present with tip projecting over the left upper quadrant consistent with location in the upper stomach. This has been advanced since the prior study. Scattered gas and stool throughout the colon. No small or large bowel distention. Visualized lung bases are clear. Degenerative changes in the spine. IMPRESSION: Enteric tube tip projects over the left upper quadrant consistent with location in the upper stomach. Electronically Signed   By: Elsie Gravely M.D.   On: 07/14/2023 01:55   DG Abd 1 View Result Date: 07/13/2023 CLINICAL DATA:  Initial evaluation for NG tube placement EXAM: ABDOMEN - 1 VIEW COMPARISON:  Prior radiograph from 07/12/2023 FINDINGS: Enteric tube in place and is seen coiled back on itself above the diaphragm, likely in the distal esophagus. Tip projects cephalad at. Repositioning recommended. Visualized bowel gas pattern is nonobstructive. No soft tissue mass or abnormal calcification Visualized lungs are clear. IMPRESSION: Enteric tube coiled back on itself above the diaphragm, likely in the distal esophagus. Repositioning recommended. Electronically Signed   By: Morene Hoard M.D.   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 Yes Co-Morbidities requiring supervision/potential complications:  - Blood pressure monitoring and control to optimize perfusion and risk factors - Poorly controlled diabetes type 2 - history of tobacco use -mild dementia -COPD -post-stroke dysphagia Due to bladder management, bowel management, safety, skin/wound care, disease  management, medication administration, pain management, and patient education, does the patient require 24 hr/day rehab nursing? Yes Does the patient require coordinated care of a physician, rehab nurse, therapy disciplines of PT, OT, SLP to address physical and functional deficits in the context of the above medical diagnosis(es)? Yes Addressing deficits in  the following areas: balance, endurance, locomotion, strength, transferring, bowel/bladder control, bathing, dressing, feeding, grooming, toileting, cognition, speech, and psychosocial support, swallowing. Can the patient actively participate in an intensive therapy program of at least 3 hrs of therapy per day at least 5 days per week? Yes The potential for patient to make measurable gains while on inpatient rehab is excellent Anticipated functional outcomes upon discharge from inpatient rehab are supervision and min assist  with PT, min assist with OT, supervision and min assist with SLP. Estimated rehab length of stay to reach the above functional goals is: 17-23 days Anticipated discharge destination: Home Overall Rehab/Functional Prognosis: good  POST ACUTE RECOMMENDATIONS: This patient's condition is appropriate for continued rehabilitative care in the following setting: CIR Patient has agreed to participate in recommended program. Potentially Note that insurance prior authorization may be required for reimbursement for recommended care.  Comment: Pt was ambulating independently at home prior to this admit and only needed some cueing to complete ADL's. If family can provide projected assist at home, she's an ideal candidate for inpatient rehab. Rehab Admissions Coordinator to follow up.      I have personally performed a face to face diagnostic evaluation of this patient. Additionally, I have examined the patient's medical record including any pertinent labs and radiographic images.    Thanks,  Arthea ONEIDA Gunther, MD 07/15/2023

## 2023-07-15 NOTE — Progress Notes (Addendum)
 STROKE TEAM PROGRESS NOTE    SIGNIFICANT HOSPITAL EVENTS 7/3: Patient admitted with acute left-sided weakness, left facial droop and right gaze preference, given TNK to treat stroke.  Patient also found to have right ICA occlusion, but family declined revascularization 7/5: Patient transferred out of the ICU  INTERIM HISTORY/SUBJECTIVE Family at the bedside.  Speech therapy is at the bedside working with patient.  Patient is tolerating food so we will discontinue NG tube No new neurological events overnight Labs and vitals are stable  CBC    Component Value Date/Time   WBC 10.3 07/15/2023 0500   RBC 4.40 07/15/2023 0500   HGB 11.4 (L) 07/15/2023 0500   HCT 35.7 (L) 07/15/2023 0500   PLT 200 07/15/2023 0500   MCV 81.1 07/15/2023 0500   MCH 25.9 (L) 07/15/2023 0500   MCHC 31.9 07/15/2023 0500   RDW 13.7 07/15/2023 0500   LYMPHSABS 2.5 07/11/2023 1458   MONOABS 0.8 07/11/2023 1458   EOSABS 0.0 07/11/2023 1458   BASOSABS 0.1 07/11/2023 1458    BMET    Component Value Date/Time   NA 133 (L) 07/15/2023 0500   K 4.4 07/15/2023 0500   CL 99 07/15/2023 0500   CO2 22 07/15/2023 0500   GLUCOSE 217 (H) 07/15/2023 0500   BUN 21 07/15/2023 0500   CREATININE 0.63 07/15/2023 0500   CREATININE 0.69 07/17/2022 1354   CALCIUM  8.9 07/15/2023 0500   EGFR 93 07/17/2022 1354   GFRNONAA >60 07/15/2023 0500   GFRNONAA 79 01/18/2020 0858    IMAGING past 24 hours No results found.   Vitals:   07/15/23 0007 07/15/23 0335 07/15/23 0751 07/15/23 1214  BP: 107/72 (!) 106/59 119/69 118/69  Pulse: 94 79 86 72  Resp: 18 18 18 19   Temp: 98.4 F (36.9 C) 98.1 F (36.7 C) 97.8 F (36.6 C) 97.7 F (36.5 C)  TempSrc: Oral Oral    SpO2: 97% 96% 100% 98%  Weight:      Height:         PHYSICAL EXAM General:  Alert, well-nourished, well-developed elderly patient in no acute distress Psych:  Mood and affect appropriate for situation CV: Regular rate and rhythm on monitor Respiratory:   Regular, unlabored respirations on room air  NEURO:  Mental Status: Alert and oriented to person and place, gives the right month but the wrong age Speech/Language: speech is with mild dysarthria,    Cranial Nerves:  II: PERRL.  Blinks to threat bilaterally III, IV, VI: Right gaze preference but able to cross midline V: Sensation is intact to light touch and symmetrical to face.  VII: Left facial droop VIII: hearing intact to voice. IX, X: Phonation is normal.  XII: tongue is midline without fasciculations. Motor: Able to move right upper and lower extremities with good antigravity strength, no movement of left upper extremity, withdraws left lower extremity to noxious Tone: is normal and bulk is normal Sensation- Intact to light touch bilaterally.  Coordination: FTN intact on the right, unable to perform on the left Gait- deferred  Most Recent NIH  1a Level of Conscious.: 0 1b LOC Questions: 1 1c LOC Commands: 0 2 Best Gaze: 1 3 Visual: 0 4 Facial Palsy: 0 5a Motor Arm - left: 4 5b Motor Arm - Right: 0 6a Motor Leg - Left: 4 6b Motor Leg - Right: 0 7 Limb Ataxia: 0 8 Sensory: 0 9 Best Language: 0 10 Dysarthria: 1 11 Extinct. and Inatten.: 0 TOTAL: 11   ASSESSMENT/PLAN  Ms.  Kathryn Mcguire is a 73 y.o. female with history of diabetes, hypertension, hyperlipidemia and COPD admitted for acute onset left-sided weakness, left facial droop and right gaze preference.  Patient was given TNK to treat her stroke, and was found to have a right internal carotid occlusion.  Family declined interventional radiology given her baseline functional status.  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 ASPECTS 10.    CTA head & neck occlusion of the right ICA from proximal cervical segment to cavernous segment with reconstitution of paraclinoid segment likely via circle  of Willis, decreased caliber and number of right MCA branches, decreased caliber in number of right ACA branches, severe stenosis at the origin of the left nondominant VA, multifocal stenosis and irregularity of the left V2 segment, likely chronic occlusion of left V4 segment after the origin of the left PICA, moderate stenosis of right PCA P2 segment, moderate stenosis of the origin of the left ICA MRI Scattered areas of acute infarct throughout the right MCA and right ACA territories. Additional punctate areas of acute infarct in the left basal ganglia and within the posterior aspect of the left ACA territory 2D Echo EF > 75% LDL 110 HgbA1c 12.3 UDS neg VTE prophylaxis - lovenox  No antithrombotic prior to admission, now on ASA 81 and plavix  DAPT for 3 months and then ASA alone Therapy recommendations:  CIR Disposition: Pending  Right ICA occlusion Occlusion of right ICA from proximal cervical segment cavernous segment with reconstitution at the paraclinoid segment noted on CT angiogram Likely acute on chronic Family declined interventions for this given baseline functional status On DAPT  Hypertension / hypotension Home meds: None Stable now  off phenylephrine  Keep systolic blood pressure greater than 110 Avoid low BP given R ICA occlusion Long term BP goal normotensive  Hyperlipidemia Home meds: Atorvastatin  20 mg daily, increased to 40 LDL 110, goal < 70 Continue statin at discharge  Diabetes type II poorly controlled Home meds: Insulin  70/30 6 units twice daily, metformin  1000 mg twice daily HgbA1c 12.3, goal < 7.0 CBGs SSI Recommend close follow-up with PCP for better DM control  Tobacco Abuse Patient chewing tobacco, large amount per daughters Tobacco cessation discussed with patient and family  Dysphagia Patient has post-stroke dysphagia SLP consulted Now on dysphagia 1 diet with thin liquids Eating well  DC tube feeding and NG tube 7/7  Other Stroke Risk  Factors Advanced age  Other Active Problems Mild dementia - resume home aricept  COPD Mild leukocytosis WBC 10.9--9.3--10.4--10.3 Hyponatremia sodium 135--131--133  Hospital day # 4  Patient seen by NP with MD, MD to edit note as needed.   Karna Geralds DNP, ACNPC-AG  [facility]  ATTENDING NOTE: I reviewed above note and agree with the assessment and plan. Pt was seen and examined.   Daughter is at the bedside. Pt sitting at the edge of bed, working with PT and OT. She ate well today and no need of cortrak anymore, will d/c NG and TF. Pending CIR. Continue DAPT and statin.   For detailed assessment and plan, please refer to above as I have made changes wherever appropriate.   Ary Cummins, MD PhD Stroke Neurology 07/15/2023 5:56 PM    To contact Stroke Continuity provider, please refer to [email or web address]. After hours, contact General Neurology

## 2023-07-15 NOTE — Progress Notes (Signed)
 Occupational Therapy Treatment Patient Details Name: Kathryn Mcguire MRN: [id number] DOB: 05-15-1950 Today's Date: 07/15/2023   History of present illness 73 y.o. female arrives 7/3 to [facility] ED presenting symptoms of left side weakness, decreased sensation, facial droop, gaze preference, and left neglect. NIH=20; CT Scans show suspicions of R ICA occlusion. S/p TNK. Pt has a hx of bipolar, dementia, DM, HTN, HLD, COPD.   OT comments  Pt demonstrating slow progress towards pt focused goals, no tone or AROM noted in her LUE but will continue to monitor and reassess need for resting hand splint as sessions continue. Pt pretty lethargic during session, need a lot of stimulation to maintain attention, she was able to lift her LLE inconsistently throughout session. Pt continues to require mod A +2 for mobility and Max A to mod A for ADLs. OT to continue to progress pt as able. DC plans remain appropriate for CIR./      If plan is discharge home, recommend the following:  Two people to help with walking and/or transfers;Two people to help with bathing/dressing/bathroom;Assistance with cooking/housework;Assist for transportation;Help with stairs or ramp for entrance;Direct supervision/assist for financial management;Direct supervision/assist for medications management;Assistance with feeding   Equipment Recommendations  Other (comment) (defer)    Recommendations for Other Services      Precautions / Restrictions Precautions Precautions: Fall;Other (comment) Recall of Precautions/Restrictions: Impaired Precaution/Restrictions Comments: LUE hemi Restrictions Weight Bearing Restrictions Per Provider Order: No       Mobility Bed Mobility Overal bed mobility: Needs Assistance Bed Mobility: Rolling, Sidelying to Sit Rolling: Min assist, Used rails Sidelying to sit: Mod assist, HOB elevated, Used rails       General bed mobility comments: mod A to raise trunk , cues needed to sequence  properly.    Transfers Overall transfer level: Needs assistance Equipment used: 2 person hand held assist Transfers: Sit to/from Stand, Bed to chair/wheelchair/BSC Sit to Stand: Mod assist, +2 physical assistance     Step pivot transfers: Mod assist, +2 physical assistance, +2 safety/equipment     General transfer comment: needs heavy mod A+2 to transfer and weight shift to offload and andvance RLE. LLE hyperextended in standing     Balance Overall balance assessment: Needs assistance Sitting-balance support: No upper extremity supported, Feet supported Sitting balance-Leahy Scale: Fair Sitting balance - Comments: CGA sitting EOB Postural control: Left lateral lean Standing balance support: Bilateral upper extremity supported Standing balance-Leahy Scale: Poor Standing balance comment: reliant on ext support.                           ADL either performed or assessed with clinical judgement   ADL Overall ADL's : Needs assistance/impaired Eating/Feeding: Set up;Sitting Eating/Feeding Details (indicate cue type and reason): with RUE Grooming: Moderate assistance;Sitting Grooming Details (indicate cue type and reason): apply lotion to LUE, needs some assist to squeeze bottle.         Upper Body Dressing : Maximal assistance;Sitting;Cueing for sequencing Upper Body Dressing Details (indicate cue type and reason): doff/don gowns, cues to sequence first in and last out.                 Functional mobility during ADLs: Moderate assistance;+2 for physical assistance;+2 for safety/equipment (pivot reclienr\)      Extremity/Trunk Assessment Upper Extremity Assessment Upper Extremity Assessment: LUE deficits/detail LUE Deficits / Details: flaccid, no AROM. able to located it when prompted. LUE Sensation: decreased light touch LUE Coordination: decreased fine  motor;decreased gross motor            Vision   Additional Comments: R gaze preference, can track  L with verbal/visula cues. Reports blurriness with distance vision, incorrect idenitification of OT fingers that improved with getting closer to pt.   Perception Perception Perception: Impaired Preception Impairment Details: Inattention/Neglect Perception-Other Comments: Lt in attention, improving. Pt will use RUE to manipulate LUE when prompted with multimodal cues.   Praxis Praxis Praxis: Not tested   Communication Communication Communication: Impaired Factors Affecting Communication: Reduced clarity of speech   Cognition   Behavior During Therapy: Flat affect Cognition: Cognition impaired, Difficult to assess Difficult to assess due to: Level of arousal   Awareness: Intellectual awareness impaired, Online awareness impaired   Attention impairment (select first level of impairment): Focused attention, Sustained attention   OT - Cognition Comments: Pt in/out of sleep during session. needs repetitive cueing to sustain attention.                 Following commands: Impaired Following commands impaired: Follows one step commands inconsistently, Follows one step commands with increased time      Cueing   Cueing Techniques: Verbal cues, Gestural cues, Tactile cues, Visual cues  Exercises      Shoulder Instructions       General Comments      Pertinent Vitals/ Pain       Pain Assessment Pain Assessment: No/denies pain  Home Living                                          Prior Functioning/Environment              Frequency  Min 2X/week        Progress Toward Goals  OT Goals(current goals can now be found in the care plan section)  Progress towards OT goals: Progressing toward goals  Acute Rehab OT Goals Patient Stated Goal: get better OT Goal Formulation: With patient Time For Goal Achievement: 07/27/23 Potential to Achieve Goals: Good  Plan      Co-evaluation    PT/OT/SLP Co-Evaluation/Treatment: Yes Reason for  Co-Treatment: Complexity of the patient's impairments (multi-system involvement);For patient/therapist safety;To address functional/ADL transfers PT goals addressed during session: Mobility/safety with mobility;Balance;Strengthening/ROM OT goals addressed during session: Strengthening/ROM;ADL's and self-care      AM-PAC OT 6 Clicks Daily Activity     Outcome Measure   Help from another person eating meals?: A Little Help from another person taking care of personal grooming?: A Lot Help from another person toileting, which includes using toliet, bedpan, or urinal?: A Lot Help from another person bathing (including washing, rinsing, drying)?: A Lot Help from another person to put on and taking off regular upper body clothing?: A Lot Help from another person to put on and taking off regular lower body clothing?: A Lot 6 Click Score: 13    End of Session Equipment Utilized During Treatment: Gait belt  OT Visit Diagnosis: Unsteadiness on feet (R26.81);Muscle weakness (generalized) (M62.81);Other symptoms and signs involving cognitive function;Cognitive communication deficit (R41.841);Low vision, both eyes (H54.2);Hemiplegia and hemiparesis Symptoms and signs involving cognitive functions: Cerebral infarction Hemiplegia - Right/Left: Left Hemiplegia - caused by: Cerebral infarction   Activity Tolerance Patient tolerated treatment well   Patient Left in chair;with call bell/phone within reach;with chair alarm set   Nurse Communication Mobility status  Time: 8857-8786 OT Time Calculation (min): 31 min  Charges: OT General Charges $OT Visit: 1 Visit OT Treatments $Therapeutic Activity: 8-22 mins     Curtistine JONETTA Das 07/15/2023, 12:41 PM

## 2023-07-15 NOTE — Progress Notes (Signed)
 Speech Language Pathology Treatment: Dysphagia;Cognitive-Linguistic  Patient Details Name: Kathryn Mcguire MRN: [id number] DOB: 07-20-1950 Today's Date: 07/15/2023 Time: 1040-1110 SLP Time Calculation (min) (ACUTE ONLY): 30 min  Assessment / Plan / Recommendation Clinical Impression  Visited pt this am with daughter present, also brief isit yesterday pm to report finding to family. Pt has been eager to eat and drink; has enjoyed all or part of several meals. Is still sleeping when not active, but wakes up and participates easily. Pt able to follow commands this session for self feeding with right hand, attending to midline, following swallowing strategies (swallow twice, cough hard as needed). Reiterated BS findings of dysphagia with strategies to reduce oral residue and improve pulmonary hygiene. Pt noted to be impulsive today, needed tactile and verbal cues for appropriate rate. Advise pt to continue puree/thin diet with supervision and strategies. Reported to MD.   HPI HPI: 74 y.o. female presented 7/3 with L sided weakness, L facial droop, and R gaze preference s/p TNK. CTA revealed R ICA occlusion. PMH includes: bipolar, dementia, DM, HTN, HLD, COPD.      SLP Plan  Continue with current plan of care          Recommendations  Diet recommendations: Dysphagia 1 (puree);Nectar-thick liquid Liquids provided via: Straw Medication Administration: Crushed with puree Supervision: Staff to assist with self feeding;Full supervision/cueing for compensatory strategies;Trained caregiver to feed patient Compensations: Slow rate;Small sips/bites;Hard cough after swallow;Multiple dry swallows after each bite/sip Postural Changes and/or Swallow Maneuvers: Seated upright 90 degrees                  Oral care QID   Frequent or constant Supervision/Assistance Dysphagia, oropharyngeal phase (R13.12)     Continue with current plan of care     Elroy Schembri, Consuelo Fitch  07/15/2023, 12:03  PM

## 2023-07-15 NOTE — Progress Notes (Signed)
 Inpatient Rehab Coordinator Note:  I met with patient at bedside, spoke with her daughter, Edsel by phone to discuss CIR recommendations and goals/expectations of CIR stay.  We reviewed 3 hrs/day of therapy, physician follow up, and average length of stay 2 weeks (dependent upon progress) with goals of mod I. Daughter is interested in pursuing CIR and I will send information to insurance to open case. Will continue to follow.   Rehab Admissons Coordinator Fatema Rabe, [place], [place] [phone]

## 2023-07-16 ENCOUNTER — Inpatient Hospital Stay (HOSPITAL_COMMUNITY)
Admission: AD | Admit: 2023-07-16 | Discharge: 2023-07-31 | DRG: 057 | Disposition: A | Discharge status: Home Health | Source: Intra-hospital | Attending: Physical Medicine & Rehabilitation | Admitting: Physical Medicine & Rehabilitation

## 2023-07-16 ENCOUNTER — Encounter (HOSPITAL_COMMUNITY): Payer: Self-pay | Admitting: Physical Medicine & Rehabilitation

## 2023-07-16 ENCOUNTER — Other Ambulatory Visit: Payer: Self-pay

## 2023-07-16 DIAGNOSIS — E785 Hyperlipidemia, unspecified: Secondary | ICD-10-CM | POA: Diagnosis present

## 2023-07-16 DIAGNOSIS — F17210 Nicotine dependence, cigarettes, uncomplicated: Secondary | ICD-10-CM | POA: Diagnosis present

## 2023-07-16 DIAGNOSIS — I10 Essential (primary) hypertension: Secondary | ICD-10-CM | POA: Diagnosis not present

## 2023-07-16 DIAGNOSIS — Z833 Family history of diabetes mellitus: Secondary | ICD-10-CM

## 2023-07-16 DIAGNOSIS — Z79899 Other long term (current) drug therapy: Secondary | ICD-10-CM | POA: Diagnosis not present

## 2023-07-16 DIAGNOSIS — R1310 Dysphagia, unspecified: Secondary | ICD-10-CM

## 2023-07-16 DIAGNOSIS — F03A0 Unspecified dementia, mild, without behavioral disturbance, psychotic disturbance, mood disturbance, and anxiety: Secondary | ICD-10-CM | POA: Insufficient documentation

## 2023-07-16 DIAGNOSIS — E871 Hypo-osmolality and hyponatremia: Secondary | ICD-10-CM | POA: Diagnosis not present

## 2023-07-16 DIAGNOSIS — Z794 Long term (current) use of insulin: Secondary | ICD-10-CM

## 2023-07-16 DIAGNOSIS — F0283 Dementia in other diseases classified elsewhere, unspecified severity, with mood disturbance: Secondary | ICD-10-CM | POA: Diagnosis present

## 2023-07-16 DIAGNOSIS — D72829 Elevated white blood cell count, unspecified: Secondary | ICD-10-CM | POA: Diagnosis present

## 2023-07-16 DIAGNOSIS — Z720 Tobacco use: Secondary | ICD-10-CM | POA: Diagnosis present

## 2023-07-16 DIAGNOSIS — Z860101 Personal history of adenomatous and serrated colon polyps: Secondary | ICD-10-CM | POA: Diagnosis not present

## 2023-07-16 DIAGNOSIS — I63511 Cerebral infarction due to unspecified occlusion or stenosis of right middle cerebral artery: Principal | ICD-10-CM | POA: Diagnosis present

## 2023-07-16 DIAGNOSIS — Z8269 Family history of other diseases of the musculoskeletal system and connective tissue: Secondary | ICD-10-CM | POA: Diagnosis not present

## 2023-07-16 DIAGNOSIS — Z801 Family history of malignant neoplasm of trachea, bronchus and lung: Secondary | ICD-10-CM | POA: Diagnosis not present

## 2023-07-16 DIAGNOSIS — I6521 Occlusion and stenosis of right carotid artery: Secondary | ICD-10-CM | POA: Diagnosis not present

## 2023-07-16 DIAGNOSIS — E1165 Type 2 diabetes mellitus with hyperglycemia: Secondary | ICD-10-CM | POA: Diagnosis not present

## 2023-07-16 DIAGNOSIS — I69391 Dysphagia following cerebral infarction: Secondary | ICD-10-CM

## 2023-07-16 DIAGNOSIS — Z7984 Long term (current) use of oral hypoglycemic drugs: Secondary | ICD-10-CM

## 2023-07-16 DIAGNOSIS — R414 Neurologic neglect syndrome: Secondary | ICD-10-CM | POA: Diagnosis not present

## 2023-07-16 DIAGNOSIS — E119 Type 2 diabetes mellitus without complications: Secondary | ICD-10-CM | POA: Diagnosis not present

## 2023-07-16 DIAGNOSIS — R739 Hyperglycemia, unspecified: Secondary | ICD-10-CM | POA: Diagnosis not present

## 2023-07-16 DIAGNOSIS — R197 Diarrhea, unspecified: Secondary | ICD-10-CM | POA: Insufficient documentation

## 2023-07-16 DIAGNOSIS — I959 Hypotension, unspecified: Secondary | ICD-10-CM | POA: Insufficient documentation

## 2023-07-16 DIAGNOSIS — I69354 Hemiplegia and hemiparesis following cerebral infarction affecting left non-dominant side: Principal | ICD-10-CM

## 2023-07-16 DIAGNOSIS — I63523 Cerebral infarction due to unspecified occlusion or stenosis of bilateral anterior cerebral arteries: Secondary | ICD-10-CM | POA: Diagnosis not present

## 2023-07-16 DIAGNOSIS — F01A0 Vascular dementia, mild, without behavioral disturbance, psychotic disturbance, mood disturbance, and anxiety: Secondary | ICD-10-CM | POA: Diagnosis not present

## 2023-07-16 DIAGNOSIS — R1312 Dysphagia, oropharyngeal phase: Secondary | ICD-10-CM | POA: Diagnosis not present

## 2023-07-16 DIAGNOSIS — R29711 NIHSS score 11: Secondary | ICD-10-CM | POA: Diagnosis not present

## 2023-07-16 DIAGNOSIS — R198 Other specified symptoms and signs involving the digestive system and abdomen: Secondary | ICD-10-CM | POA: Diagnosis not present

## 2023-07-16 DIAGNOSIS — Z820 Family history of epilepsy and other diseases of the nervous system: Secondary | ICD-10-CM

## 2023-07-16 DIAGNOSIS — J439 Emphysema, unspecified: Secondary | ICD-10-CM | POA: Diagnosis not present

## 2023-07-16 DIAGNOSIS — E46 Unspecified protein-calorie malnutrition: Secondary | ICD-10-CM | POA: Diagnosis present

## 2023-07-16 DIAGNOSIS — Z6821 Body mass index (BMI) 21.0-21.9, adult: Secondary | ICD-10-CM

## 2023-07-16 DIAGNOSIS — J449 Chronic obstructive pulmonary disease, unspecified: Secondary | ICD-10-CM | POA: Diagnosis present

## 2023-07-16 DIAGNOSIS — Z7982 Long term (current) use of aspirin: Secondary | ICD-10-CM

## 2023-07-16 LAB — BASIC METABOLIC PANEL WITH GFR
Anion gap: 14 (ref 5–15)
Anion gap: 8 (ref 5–15)
BUN: 17 mg/dL (ref 8–23)
BUN: 26 mg/dL — ABNORMAL HIGH (ref 8–23)
CO2: 22 mmol/L (ref 22–32)
CO2: 28 mmol/L (ref 22–32)
Calcium: 9.3 mg/dL (ref 8.9–10.3)
Calcium: 9.3 mg/dL (ref 8.9–10.3)
Chloride: 101 mmol/L (ref 98–111)
Chloride: 96 mmol/L — ABNORMAL LOW (ref 98–111)
Creatinine, Ser: 0.65 mg/dL (ref 0.44–1.00)
Creatinine, Ser: 0.79 mg/dL (ref 0.44–1.00)
GFR, Estimated: >60 mL/min (ref >60)
GFR, Estimated: >60 mL/min (ref >60)
Glucose, Bld: 124 mg/dL — ABNORMAL HIGH (ref 70–99)
Glucose, Bld: 210 mg/dL — ABNORMAL HIGH (ref 70–99)
Potassium: 4.3 mmol/L (ref 3.5–5.1)
Potassium: 4.5 mmol/L (ref 3.5–5.1)
Sodium: 137 mmol/L (ref 135–145)
Sodium: 132 mmol/L — ABNORMAL LOW (ref 135–145)

## 2023-07-16 LAB — CBC
HCT: 38.7 % (ref 36.0–46.0)
Hemoglobin: 12 g/dL (ref 12.0–15.0)
MCH: 25.2 pg — ABNORMAL LOW (ref 26.0–34.0)
MCHC: 31 g/dL (ref 30.0–36.0)
MCV: 81.3 fL (ref 80.0–100.0)
Platelets: 208 K/uL (ref 150–400)
RBC: 4.76 MIL/uL (ref 3.87–5.11)
RDW: 13.8 % (ref 11.5–15.5)
WBC: 10.4 K/uL (ref 4.0–10.5)
nRBC: 0 % (ref 0.0–0.2)

## 2023-07-16 LAB — GLUCOSE, CAPILLARY
Glucose-Capillary: 132 mg/dL — ABNORMAL HIGH (ref 70–99)
Glucose-Capillary: 138 mg/dL — ABNORMAL HIGH (ref 70–99)
Glucose-Capillary: 259 mg/dL — ABNORMAL HIGH (ref 70–99)
Glucose-Capillary: 286 mg/dL — ABNORMAL HIGH (ref 70–99)
Glucose-Capillary: 207 mg/dL — ABNORMAL HIGH (ref 70–99)

## 2023-07-16 MED ORDER — DIPHENHYDRAMINE HCL 25 MG PO CAPS
25.0000 mg | ORAL_CAPSULE | Freq: Four times a day (QID) | ORAL | Status: DC | PRN
  Administered 2023-07-22: 25 mg via ORAL
  Filled 2023-07-16: qty 1

## 2023-07-16 MED ORDER — ACETAMINOPHEN 325 MG PO TABS: 650.0000 mg | ORAL_TABLET | ORAL | Status: DC | PRN

## 2023-07-16 MED ORDER — ALUM & MAG HYDROXIDE-SIMETH 200-200-20 MG/5ML PO SUSP: 30.0000 mL | ORAL | Status: DC | PRN

## 2023-07-16 MED ORDER — INSULIN ASPART 100 UNIT/ML IJ SOLN
0.0000 [IU] | Freq: Three times a day (TID) | INTRAMUSCULAR | Status: DC
  Administered 2023-07-17: 2 [IU] via SUBCUTANEOUS
  Administered 2023-07-17: 5 [IU] via SUBCUTANEOUS
  Administered 2023-07-17: 3 [IU] via SUBCUTANEOUS
  Administered 2023-07-18: 2 [IU] via SUBCUTANEOUS
  Administered 2023-07-18: 3 [IU] via SUBCUTANEOUS
  Administered 2023-07-18: 2 [IU] via SUBCUTANEOUS
  Administered 2023-07-19 (×2): 1 [IU] via SUBCUTANEOUS
  Administered 2023-07-19 – 2023-07-20 (×2): 2 [IU] via SUBCUTANEOUS
  Administered 2023-07-20 (×2): 1 [IU] via SUBCUTANEOUS
  Administered 2023-07-21 (×2): 2 [IU] via SUBCUTANEOUS
  Administered 2023-07-22: 3 [IU] via SUBCUTANEOUS
  Administered 2023-07-22 – 2023-07-24 (×4): 1 [IU] via SUBCUTANEOUS
  Administered 2023-07-24: 2 [IU] via SUBCUTANEOUS
  Administered 2023-07-25 (×2): 1 [IU] via SUBCUTANEOUS
  Administered 2023-07-26: 2 [IU] via SUBCUTANEOUS
  Administered 2023-07-26: 1 [IU] via SUBCUTANEOUS
  Administered 2023-07-26: 3 [IU] via SUBCUTANEOUS
  Administered 2023-07-27 (×2): 1 [IU] via SUBCUTANEOUS
  Administered 2023-07-27: 2 [IU] via SUBCUTANEOUS
  Administered 2023-07-28 (×3): 1 [IU] via SUBCUTANEOUS
  Administered 2023-07-29: 5 [IU] via SUBCUTANEOUS
  Administered 2023-07-29: 1 [IU] via SUBCUTANEOUS
  Administered 2023-07-29: 3 [IU] via SUBCUTANEOUS
  Administered 2023-07-30: 5 [IU] via SUBCUTANEOUS
  Administered 2023-07-30: 2 [IU] via SUBCUTANEOUS
  Administered 2023-07-30: 3 [IU] via SUBCUTANEOUS
  Administered 2023-07-31: 2 [IU] via SUBCUTANEOUS

## 2023-07-16 MED ORDER — PANTOPRAZOLE SODIUM 40 MG PO TBEC: 40.0000 mg | DELAYED_RELEASE_TABLET | Freq: Every day | ORAL | Status: DC

## 2023-07-16 MED ORDER — GUAIFENESIN-DM 100-10 MG/5ML PO SYRP: 5.0000 mL | ORAL_SOLUTION | Freq: Four times a day (QID) | ORAL | Status: DC | PRN

## 2023-07-16 MED ORDER — ENOXAPARIN SODIUM 40 MG/0.4ML IJ SOSY: 40.0000 mg | PREFILLED_SYRINGE | INTRAMUSCULAR | Status: DC

## 2023-07-16 MED ORDER — PANTOPRAZOLE SODIUM 40 MG PO TBEC
40.0000 mg | DELAYED_RELEASE_TABLET | Freq: Every day | ORAL | Status: DC
  Administered 2023-07-16 – 2023-07-30 (×15): 40 mg via ORAL
  Filled 2023-07-16 (×15): qty 1

## 2023-07-16 MED ORDER — PROCHLORPERAZINE 25 MG RE SUPP: 12.5000 mg | Freq: Four times a day (QID) | RECTAL | Status: DC | PRN

## 2023-07-16 MED ORDER — ENSURE MAX PROTEIN PO LIQD
11.0000 [oz_av] | Freq: Two times a day (BID) | ORAL | Status: DC
  Administered 2023-07-16 – 2023-07-31 (×27): 11 [oz_av] via ORAL

## 2023-07-16 MED ORDER — SENNOSIDES-DOCUSATE SODIUM 8.6-50 MG PO TABS: 1.0000 | ORAL_TABLET | Freq: Every evening | ORAL | Status: DC | PRN

## 2023-07-16 MED ORDER — MELATONIN 3 MG PO TABS
3.0000 mg | ORAL_TABLET | Freq: Every evening | ORAL | Status: DC | PRN
  Administered 2023-07-17 – 2023-07-23 (×4): 3 mg via ORAL
  Filled 2023-07-16 (×4): qty 1

## 2023-07-16 MED ORDER — INSULIN ASPART 100 UNIT/ML IJ SOLN: 0.0000 [IU] | INTRAMUSCULAR | Status: DC

## 2023-07-16 MED ORDER — DONEPEZIL HCL 10 MG PO TABS: 5.0000 mg | ORAL_TABLET | Freq: Every day | ORAL | Status: DC

## 2023-07-16 MED ORDER — FLEET ENEMA RE ENEM: 1.0000 | ENEMA | Freq: Once | RECTAL | Status: DC | PRN

## 2023-07-16 MED ORDER — CLOPIDOGREL BISULFATE 75 MG PO TABS
75.0000 mg | ORAL_TABLET | Freq: Every day | ORAL | Status: DC
  Administered 2023-07-17 – 2023-07-31 (×15): 75 mg via ORAL
  Filled 2023-07-16 (×15): qty 1

## 2023-07-16 MED ORDER — DONEPEZIL HCL 10 MG PO TABS
5.0000 mg | ORAL_TABLET | Freq: Every day | ORAL | Status: DC
  Administered 2023-07-16 – 2023-07-30 (×15): 5 mg via ORAL
  Filled 2023-07-16 (×16): qty 1

## 2023-07-16 MED ORDER — ENSURE PLUS HIGH PROTEIN PO LIQD: 237.0000 mL | Freq: Two times a day (BID) | ORAL | Status: DC

## 2023-07-16 MED ORDER — ACETAMINOPHEN 325 MG PO TABS: 325.0000 mg | ORAL_TABLET | ORAL | Status: DC | PRN

## 2023-07-16 MED ORDER — DONEPEZIL HCL 5 MG PO TABS: 5.0000 mg | ORAL_TABLET | Freq: Every day | ORAL | Status: DC

## 2023-07-16 MED ORDER — ASPIRIN 81 MG PO CHEW
81.0000 mg | CHEWABLE_TABLET | Freq: Every day | ORAL | Status: DC
  Administered 2023-07-17 – 2023-07-31 (×15): 81 mg via ORAL
  Filled 2023-07-16 (×15): qty 1

## 2023-07-16 MED ORDER — PROCHLORPERAZINE EDISYLATE 10 MG/2ML IJ SOLN: 5.0000 mg | Freq: Four times a day (QID) | INTRAMUSCULAR | Status: DC | PRN

## 2023-07-16 MED ORDER — ATORVASTATIN CALCIUM 40 MG PO TABS
40.0000 mg | ORAL_TABLET | Freq: Every day | ORAL | Status: DC
  Administered 2023-07-17 – 2023-07-31 (×15): 40 mg via ORAL
  Filled 2023-07-16 (×15): qty 1

## 2023-07-16 MED ORDER — PANTOPRAZOLE SODIUM 40 MG PO TBEC
40.0000 mg | DELAYED_RELEASE_TABLET | Freq: Every day | ORAL | Status: DC
  Filled 2023-07-16: qty 1

## 2023-07-16 MED ORDER — BISACODYL 10 MG RE SUPP: 10.0000 mg | Freq: Every day | RECTAL | Status: DC | PRN

## 2023-07-16 MED ORDER — ATORVASTATIN CALCIUM 40 MG PO TABS: 40.0000 mg | ORAL_TABLET | Freq: Every day | ORAL | Status: DC

## 2023-07-16 MED ORDER — SENNOSIDES-DOCUSATE SODIUM 8.6-50 MG PO TABS: 1.0000 | ORAL_TABLET | Freq: Every evening | ORAL | Status: AC | PRN

## 2023-07-16 MED ORDER — CLOPIDOGREL BISULFATE 75 MG PO TABS
75.0000 mg | ORAL_TABLET | Freq: Every day | ORAL | Status: DC
  Administered 2023-07-16: 75 mg via ORAL
  Filled 2023-07-16: qty 1

## 2023-07-16 MED ORDER — ASPIRIN 81 MG PO CHEW
81.0000 mg | CHEWABLE_TABLET | Freq: Every day | ORAL | Status: DC
  Administered 2023-07-16: 81 mg via ORAL
  Filled 2023-07-16: qty 1

## 2023-07-16 MED ORDER — METFORMIN HCL 500 MG PO TABS
250.0000 mg | ORAL_TABLET | Freq: Two times a day (BID) | ORAL | Status: DC
  Administered 2023-07-17 – 2023-07-22 (×11): 250 mg via ORAL
  Filled 2023-07-16 (×11): qty 1

## 2023-07-16 MED ORDER — INSULIN ASPART 100 UNIT/ML IJ SOLN
0.0000 [IU] | Freq: Every day | INTRAMUSCULAR | Status: DC
  Administered 2023-07-16: 2 [IU] via SUBCUTANEOUS

## 2023-07-16 MED ORDER — ATORVASTATIN CALCIUM 40 MG PO TABS
40.0000 mg | ORAL_TABLET | Freq: Every day | ORAL | Status: DC
  Administered 2023-07-16: 40 mg via ORAL
  Filled 2023-07-16: qty 1

## 2023-07-16 MED ORDER — ENOXAPARIN SODIUM 40 MG/0.4ML IJ SOSY
40.0000 mg | PREFILLED_SYRINGE | INTRAMUSCULAR | Status: DC
  Administered 2023-07-16 – 2023-07-30 (×15): 40 mg via SUBCUTANEOUS
  Filled 2023-07-16 (×15): qty 0.4

## 2023-07-16 MED ORDER — ASPIRIN 81 MG PO CHEW: 81.0000 mg | CHEWABLE_TABLET | Freq: Every day | ORAL | Status: DC

## 2023-07-16 MED ORDER — PROCHLORPERAZINE MALEATE 5 MG PO TABS: 5.0000 mg | ORAL_TABLET | Freq: Four times a day (QID) | ORAL | Status: DC | PRN

## 2023-07-16 MED ORDER — PANTOPRAZOLE SODIUM 40 MG IV SOLR: 40.0000 mg | Freq: Every day | INTRAVENOUS | Status: DC

## 2023-07-16 MED ORDER — CLOPIDOGREL BISULFATE 75 MG PO TABS: 75.0000 mg | ORAL_TABLET | Freq: Every day | ORAL | Status: DC

## 2023-07-16 NOTE — Discharge Summary (Addendum)
 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, etiology: large vessel disease   Secondary diagnosis Right ICA occlusion Hypertension Hyperlipidemia Uncontrolled diabetes Smoker Dysphagia Mild dementia COPD    Allergies as of 07/16/2023   No Known Allergies      Medication List     STOP taking these medications    aspirin  EC 81 MG tablet Replaced by: aspirin  81 MG chewable tablet   Insulin  Pen Needle 32G X 4 MM Misc   metFORMIN  1000 MG tablet Commonly known as: GLUCOPHAGE    NovoLIN  70/30 (70-30) 100 UNIT/ML injection Generic drug: insulin  NPH-regular Human   ONE TOUCH ULTRA 2 w/Device Kit   OneTouch Ultra test strip Generic drug: glucose blood   onetouch ultrasoft lancets       TAKE these medications    acetaminophen  325 MG tablet Commonly known as: TYLENOL  Take 2 tablets (650 mg total) by mouth every 4 (four) hours as needed for mild pain (pain score 1-3) or fever (or temp > 37.5 C (99.5 F)). What changed:  when to take this reasons to take this   aspirin  81 MG chewable tablet Chew 1 tablet (81 mg total) by mouth daily. Start taking on: July 17, 2023 Replaces: aspirin  EC 81 MG tablet   atorvastatin  40 MG tablet Commonly known as: LIPITOR Take 1 tablet (40 mg total) by mouth daily. Start taking on: July 17, 2023 What changed:  medication strength how much to take   clopidogrel  75 MG tablet Commonly known as: PLAVIX  Take 1 tablet (75 mg total) by mouth daily. Start taking on: July 17, 2023   donepezil  5 MG tablet Commonly known as: ARICEPT  Take 1 tablet (5 mg total) by mouth at bedtime. What changed: See the new instructions.    enoxaparin  40 MG/0.4ML injection Commonly known as: LOVENOX  Inject 0.4 mLs (40 mg total) into the skin daily.   feeding supplement Liqd Take 237 mLs by mouth 2 (two) times daily between meals. Start taking on: July 17, 2023 What changed: when to take this   insulin  aspart 100 UNIT/ML injection Commonly known as: novoLOG  Inject 0-15 Units into the skin every 4 (four) hours.   pantoprazole  40 MG tablet Commonly known as: PROTONIX  Take 1 tablet (40 mg total) by mouth daily with supper.   senna-docusate 8.6-50 MG tablet Commonly known as: Senokot-S Take 1 tablet by mouth at bedtime as needed for mild constipation.        LABORATORY STUDIES CBC    Component Value Date/Time   WBC 10.4 07/16/2023 0505   RBC 4.76 07/16/2023 0505   HGB 12.0 07/16/2023 0505   HCT 38.7 07/16/2023 0505   PLT 208 07/16/2023 0505   MCV 81.3 07/16/2023 0505   MCH 25.2 (L) 07/16/2023 0505   MCHC 31.0 07/16/2023 0505   RDW 13.8 07/16/2023 0505   LYMPHSABS 2.5 07/11/2023 1458   MONOABS 0.8 07/11/2023 1458   EOSABS 0.0 07/11/2023 1458   BASOSABS 0.1 07/11/2023 1458   CMP    Component Value Date/Time   NA 137 07/16/2023 0505   K 4.3 07/16/2023 0505   CL 101 07/16/2023 0505   CO2  22 07/16/2023 0505   GLUCOSE 124 (H) 07/16/2023 0505   BUN 17 07/16/2023 0505   CREATININE 0.65 07/16/2023 0505   CREATININE 0.69 07/17/2022 1354   CALCIUM  9.3 07/16/2023 0505   PROT 6.9 07/11/2023 1458   ALBUMIN 3.7 07/11/2023 1458   AST 21 07/11/2023 1458   ALT 17 07/11/2023 1458   ALKPHOS 63 07/11/2023 1458   BILITOT 0.4 07/11/2023 1458   GFRNONAA >60 07/16/2023 0505   GFRNONAA 79 01/18/2020 0858   GFRAA 91 01/18/2020 0858   COAGS Lab Results  Component Value Date   INR 1.0 07/11/2023   INR 1.01 01/14/2017   Lipid Panel    Component Value Date/Time   CHOL 171 07/12/2023 0512   TRIG 58 07/12/2023 0512   HDL 49 07/12/2023 0512   CHOLHDL 3.5 07/12/2023 0512   VLDL 12 07/12/2023 0512   LDLCALC 110  (H) 07/12/2023 0512   LDLCALC 128 (H) 07/17/2022 1354   HgbA1C  Lab Results  Component Value Date   HGBA1C 12.3 (H) 07/11/2023   Alcohol Level    Component Value Date/Time   [facility] <15 07/11/2023 1458     SIGNIFICANT DIAGNOSTIC STUDIES DG Abd 1 View Result Date: 07/14/2023 CLINICAL DATA:  Nasogastric tube placement. EXAM: ABDOMEN - 1 VIEW COMPARISON:  Radiographs 07/14/2023 and 07/13/2023. FINDINGS: 1211 hours. The enteric tube has been advanced, tip overlying the expected location of the mid stomach. There is contrast material within nondistended small bowel. Grossly stable superior endplate compression deformity at L2. IMPRESSION: Enteric tube tip overlies the expected location of the mid stomach. Electronically Signed   By: Elsie Perone M.D.   On: 07/14/2023 12:31   DG Swallowing Func-Speech Pathology Result Date: 07/14/2023 Table formatting from the original result was not included. Modified Barium Swallow Study Patient Details Name: Kathryn Mcguire MRN: [id number] Date of Birth: 04-06-1950 Today's Date: 07/14/2023 HPI/PMH: HPI: 73 y.o. female presented 7/3 with L sided weakness, L facial droop, and R gaze preference s/p TNK. CTA revealed R ICA occlusion. PMH includes: bipolar, dementia, DM, HTN, HLD, COPD. Clinical Impression: Pt presents with a primary oral dysphagia with left sided lingual and facial weakness with additional sensory and attention impairment. Pt has disorganized lingual collection of bolus with passive spillage to the pyriform sinus and modreate oral residuals in the left floor of mouth and buccal cavity. There is an added pharyngeal impairment, likely related to decreased sensation to trigger swallow. Bolus pools in pharynx for 5 to 30 seconds, in one instance leading to sensed aspiration before the swallow with thin liquids. Cough did not immediately eject aspirate, but pt had prolonged coughing that continued to mobilize aspirate to upper trachea. Further trials of straw  sips of thin liquids were not aspirated. Pt benefits from cues to swallow twice: to clear oral residue and swallow.  Pt will be at risk of aspiraton of oral residue regardless of texture. Pt demonstrated improvement in timing of airway protection throughout study and would be more likely to mobilize aspirate of thin liquids. Advise initiating pureed solids (pt edentulous with severe oral weakness) and thin liquids, with full supervision and assist needed for oral intake. Focus on oral hygiene, water  intake, pt awareness and second swallow. If pt coughs, encourage harder coughing for pulmonary hygiene. Factors that may increase risk of adverse event in presence of aspiration Noe & Lianne 2021): Factors that may increase risk of adverse event in presence of aspiration Noe & Lianne 2021): Dependence for feeding and/or oral hygiene; Reduced  cognitive function Recommendations/Plan: Swallowing Evaluation Recommendations Swallowing Evaluation Recommendations Recommendations: PO diet PO Diet Recommendation: Dysphagia 1 (Pureed); Thin liquids (Level 0) Liquid Administration via: Straw Medication Administration: Crushed with puree Supervision: Full assist for feeding; Full supervision/cueing for swallowing strategies Swallowing strategies  : Slow rate; Small bites/sips; Check for pocketing or oral holding; Check for anterior loss; Multiple dry swallows after each bite/sip; Hard cough after swallowing Postural changes: Stay upright 30-60 min after meals; Position pt fully upright for meals Oral care recommendations: Oral care before PO; Oral care QID (4x/day) Caregiver Recommendations: Have oral suction available Treatment Plan Treatment Plan Treatment recommendations: Therapy as outlined in treatment plan below Follow-up recommendations: Acute inpatient rehab (3 hours/day) Treatment frequency: Min 2x/week Treatment duration: 2 weeks Recommendations Recommendations for follow up therapy are one component of a  multi-disciplinary discharge planning process, led by the attending physician.  Recommendations may be updated based on patient status, additional functional criteria and insurance authorization. Assessment: Orofacial Exam: Orofacial Exam Oral Cavity: Oral Hygiene: WFL Oral Cavity - Dentition: Edentulous Oral Motor/Sensory Function: Suspected cranial nerve impairment CN V - Trigeminal: Left sensory impairment CN VII - Facial: Left motor impairment CN IX - Glossopharyngeal, CN X - Vagus: Left motor impairment CN XII - Hypoglossal: Left motor impairment Anatomy: Anatomy: WFL Boluses Administered: Boluses Administered Boluses Administered: Thin liquids (Level 0); Mildly thick liquids (Level 2, nectar thick); Moderately thick liquids (Level 3, honey thick); Puree; Solid  Oral Impairment Domain: Oral Impairment Domain Lip Closure: Escape beyond mid-chin Tongue control during bolus hold: Not tested Bolus preparation/mastication: -- (NT) Bolus transport/lingual motion: Repetitive/disorganized tongue motion; Slow tongue motion Oral residue: Residue collection on oral structures Location of oral residue : Floor of mouth; Lateral sulci Initiation of pharyngeal swallow : Pyriform sinuses  Pharyngeal Impairment Domain: Pharyngeal Impairment Domain Soft palate elevation: No bolus between soft palate (SP)/pharyngeal wall (PW) Laryngeal elevation: Complete superior movement of thyroid  cartilage with complete approximation of arytenoids to epiglottic petiole Anterior hyoid excursion: Complete anterior movement Epiglottic movement: No inversion Laryngeal vestibule closure: Complete, no air/contrast in laryngeal vestibule Pharyngeal stripping wave : Present - complete Pharyngoesophageal segment opening: Complete distension and complete duration, no obstruction of flow Tongue base retraction: Trace column of contrast or air between tongue base and PPW Pharyngeal residue: Trace residue within or on pharyngeal structures Location of  pharyngeal residue: Valleculae  Esophageal Impairment Domain: Esophageal Impairment Domain Esophageal clearance upright position: -- (NT) Pill: No data recorded Penetration/Aspiration Scale Score: Penetration/Aspiration Scale Score 1.  Material does not enter airway: Mildly thick liquids (Level 2, nectar thick); Moderately thick liquids (Level 3, honey thick); Puree; Thin liquids (Level 0) 2.  Material enters airway, remains ABOVE vocal cords then ejected out: Thin liquids (Level 0); Mildly thick liquids (Level 2, nectar thick) 7.  Material enters airway, passes BELOW cords and not ejected out despite cough attempt by patient: Thin liquids (Level 0) Compensatory Strategies: No data recorded  General Information: Caregiver present: No  Diet Prior to this Study: NPO   Temperature : Normal   Respiratory Status: WFL   Supplemental O2: None (Room air)   History of Recent Intubation: No  Behavior/Cognition: Alert; Cooperative; Requires cueing Self-Feeding Abilities: Needs assist with self-feeding Baseline vocal quality/speech: Not observed No data recorded Volitional Swallow: Able to elicit Exam Limitations: Excessive movement Goal Planning: Prognosis for improved oropharyngeal function: Good Barriers to Reach Goals: Cognitive deficits No data recorded No data recorded Consulted and agree with results and recommendations: Pt unable/family or caregiver not  available Pain: Pain Assessment Pain Assessment: No/denies pain Faces Pain Scale: 0 End of Session: Start Time:SLP Start Time (ACUTE ONLY): 1030 Stop Time: SLP Stop Time (ACUTE ONLY): 1050 Time Calculation:SLP Time Calculation (min) (ACUTE ONLY): 20 min Charges: SLP Evaluations $ SLP Speech Visit: 1 Visit SLP Evaluations $MBS Swallow: 1 Procedure $Swallowing Treatment: 1 Procedure SLP visit diagnosis: SLP Visit Diagnosis: Dysphagia, oropharyngeal phase (R13.12) Past Medical History: Past Medical History: Diagnosis Date  Arthritis   Bipolar depression (HCC)   COPD  (chronic obstructive pulmonary disease) (HCC)   Dementia (HCC)   Diabetes mellitus without complication (HCC)   Elevated LFTs   Emphysema of lung (HCC)   GERD (gastroesophageal reflux disease)   History of adenomatous polyps of colon 10/15/2013  Hyperlipemia   Hypertension   UTI (lower urinary tract infection)  Past Surgical History: Past Surgical History: Procedure Laterality Date  COLONOSCOPY    ESOPHAGOGASTRODUODENOSCOPY    TONSILLECTOMY    TONSILLECTOMY AND ADENOIDECTOMY    TUBAL LIGATION   DeBlois, Consuelo Fitch 07/14/2023, 11:13 AM  DG Abd 1 View Result Date: 07/14/2023 CLINICAL DATA:  NG tube placement EXAM: ABDOMEN - 1 VIEW COMPARISON:  07/13/2023 FINDINGS: Limited field of view for tube placement verification purposes. An enteric tube is present with tip projecting over the left upper quadrant consistent with location in the upper stomach. This has been advanced since the prior study. Scattered gas and stool throughout the colon. No small or large bowel distention. Visualized lung bases are clear. Degenerative changes in the spine. IMPRESSION: Enteric tube tip projects over the left upper quadrant consistent with location in the upper stomach. Electronically Signed   By: Elsie Gravely M.D.   On: 07/14/2023 01:55   DG Abd 1 View Result Date: 07/13/2023 CLINICAL DATA:  Initial evaluation for NG tube placement EXAM: ABDOMEN - 1 VIEW COMPARISON:  Prior radiograph from 07/12/2023 FINDINGS: Enteric tube in place and is seen coiled back on itself above the diaphragm, likely in the distal esophagus. Tip projects cephalad at. Repositioning recommended. Visualized bowel gas pattern is nonobstructive. No soft tissue mass or abnormal calcification Visualized lungs are clear. IMPRESSION: Enteric tube coiled back on itself above the diaphragm, likely in the distal esophagus. Repositioning recommended. Electronically Signed   By: Morene Hoard M.D.   On: 07/13/2023 23:24   DG Abd Portable 1V Result Date:  07/12/2023 CLINICAL DATA:  NG tube placement. EXAM: PORTABLE ABDOMEN - 1 VIEW COMPARISON:  08/04/2013. FINDINGS: The bowel gas pattern is normal. An enteric tube terminates in the stomach and appears appropriate position. No radio-opaque calculi or other acute radiographic abnormality are seen. IMPRESSION: 1. Nonobstructive bowel gas pattern. 2. Enteric tube terminates in the stomach. Electronically Signed   By: Leita Birmingham M.D.   On: 07/12/2023 15:28   MR BRAIN WO CONTRAST Result Date: 07/12/2023 CLINICAL DATA:  Stroke follow-up, right ICA occlusion. EXAM: MRI HEAD WITHOUT CONTRAST TECHNIQUE: Multiplanar, multiecho pulse sequences of the brain and surrounding structures were obtained without intravenous contrast. COMPARISON:  CT head and CTA head and neck 07/11/2023. FINDINGS: Brain: Diffuse areas of restricted diffusion in the right frontoparietal lobes within the right MCA and right ACA territories. Associated edema and sulcal effacement. Few areas of susceptibility in the right frontoparietal lobes which may reflect developing petechial hemorrhage. There are additional areas of restricted diffusion along the right insular cortex and right temporal lobe. Few areas of restricted diffusion involving the cortex and subcortical white matter in the lateral right occipital lobe. Five additional  punctate areas of restricted diffusion in the left basal ganglia primarily in the caudate head and lentiform nucleus. Small focus of restricted diffusion in the parasagittal posterior left frontal lobe at the vertex within the ACA territory. No midline shift. T2/FLAIR hyperintensity in the periventricular and subcortical white matter. Mild parenchymal volume loss. The basilar cisterns are patent. No extra-axial fluid collections. Ventricles: Normal size and configuration of the ventricles. Vascular: Skull base flow voids are visualized. Right ICA flow void is visualized at the skull base. Skull and upper cervical spine: No  focal abnormality. Sinuses/Orbits: Orbits are symmetric. Paranasal sinuses are clear. Other: Mastoid air cells are clear. IMPRESSION: Scattered areas of acute infarct throughout the right MCA and right ACA territories. Additional punctate areas of acute infarct in the left basal ganglia and within the posterior aspect of the left ACA territory. Mild edema and sulcal effacement in the right frontoparietal lobes. No midline shift. Mild chronic microvascular ischemic changes. Right ICA flow void is visualized at the skull base which may reflect interval improvement in flow within the vessel compared to the prior CTA. Electronically Signed   By: Donnice Mania M.D.   On: 07/12/2023 15:22   ECHOCARDIOGRAM COMPLETE Result Date: 07/12/2023    ECHOCARDIOGRAM REPORT   Patient Name:   Parisha A Iyer Date of Exam: 07/12/2023 Medical Rec #:  [id number]          Height:       63.0 in Accession #:    [id number]         Weight:       122.1 lb Date of Birth:  1950/05/14         BSA:          1.568 m Patient Age:    72 years           BP:           137/78 mmHg Patient Gender: F                  HR:           83 bpm. Exam Location:  Inpatient Procedure: 2D Echo, Cardiac Doppler and Color Doppler (Both Spectral and Color            Flow Doppler were utilized during procedure). Indications:    Stroke  History:        Patient has no prior history of Echocardiogram examinations.                 COPD; Risk Factors:Hypertension, Dyslipidemia and Diabetes.  Sonographer:    Philomena Daring Referring Phys: Anius.Banister MCNEILL P KIRKPATRICK IMPRESSIONS  1. Left ventricular ejection fraction, by estimation, is >75%. The left ventricle has hyperdynamic function. The left ventricle has no regional wall motion abnormalities. Left ventricular diastolic parameters were normal.  2. Right ventricular systolic function is normal. The right ventricular size is normal. Moderately increased right ventricular wall thickness. There is normal pulmonary artery  systolic pressure. The estimated right ventricular systolic pressure is 28.0 mmHg.  3. Findings are consistent with cardiac tamponade.  4. The mitral valve is degenerative. No evidence of mitral valve regurgitation. No evidence of mitral stenosis.  5. The aortic valve is normal in structure. Aortic valve regurgitation is not visualized. No aortic stenosis is present.  6. The inferior vena cava is normal in size with greater than 50% respiratory variability, suggesting right atrial pressure of 3 mmHg.  7. Subcostal images were suboptimal but Agitated saline contrast bubble  study was negative, with no evidence of any interatrial shunt. Clinical correlation required. Comparison(s): No prior Echocardiogram. Conclusion(s)/Recommendation(s): No intracardiac source of embolism detected on this transthoracic study. Consider a transesophageal echocardiogram to exclude cardiac source of embolism if clinically indicated. FINDINGS  Left Ventricle: Left ventricular ejection fraction, by estimation, is >75%. The left ventricle has hyperdynamic function. The left ventricle has no regional wall motion abnormalities. The left ventricular internal cavity size was small. There is no left  ventricular hypertrophy. Left ventricular diastolic parameters were normal. Right Ventricle: The right ventricular size is normal. Moderately increased right ventricular wall thickness. Right ventricular systolic function is normal. There is normal pulmonary artery systolic pressure. The tricuspid regurgitant velocity is 2.50 m/s, and with an assumed right atrial pressure of 3 mmHg, the estimated right ventricular systolic pressure is 28.0 mmHg. Left Atrium: Left atrial size was normal in size. Right Atrium: Right atrial size was normal in size. Pericardium: Trivial pericardial effusion is present. There is evidence of cardiac tamponade. Presence of epicardial fat layer. Mitral Valve: The mitral valve is degenerative in appearance. There is mild  thickening of the mitral valve leaflet(s). Normal mobility of the mitral valve leaflets. Mild mitral annular calcification. No evidence of mitral valve regurgitation. No evidence  of mitral valve stenosis. Tricuspid Valve: The tricuspid valve is normal in structure. Tricuspid valve regurgitation is mild . No evidence of tricuspid stenosis. Aortic Valve: The aortic valve is normal in structure. Aortic valve regurgitation is not visualized. No aortic stenosis is present. Pulmonic Valve: The pulmonic valve was not well visualized. Pulmonic valve regurgitation is not visualized. Aorta: The aortic root was not well visualized. Venous: The inferior vena cava is normal in size with greater than 50% respiratory variability, suggesting right atrial pressure of 3 mmHg. IAS/Shunts: The interatrial septum was not well visualized. Agitated saline contrast was given intravenously to evaluate for intracardiac shunting. Subcostal images were suboptimal but Agitated saline contrast bubble study was negative, with no evidence of any interatrial shunt. Clinical correlation required.  LEFT VENTRICLE PLAX 2D LVIDd:         3.60 cm   Diastology LVIDs:         2.40 cm   LV e' medial:    7.18 cm/s LV PW:         0.90 cm   LV E/e' medial:  11.2 LV IVS:        0.90 cm   LV e' lateral:   11.40 cm/s LVOT diam:     2.00 cm   LV E/e' lateral: 7.1 LV SV:         63 LV SV Index:   40 LVOT Area:     3.14 cm  RIGHT VENTRICLE             IVC RV S prime:     14.50 cm/s  IVC diam: 1.40 cm TAPSE (M-mode): 1.7 cm LEFT ATRIUM             Index        RIGHT ATRIUM          Index LA diam:        2.50 cm 1.59 cm/m   RA Area:     9.17 cm LA Vol (A2C):   16.2 ml 10.33 ml/m  RA Volume:   17.30 ml 11.03 ml/m LA Vol (A4C):   31.8 ml 20.28 ml/m LA Biplane Vol: 22.7 ml 14.48 ml/m  AORTIC VALVE LVOT Vmax:   106.00 cm/s LVOT Vmean:  70.200 cm/s LVOT VTI:    0.199 m MITRAL VALVE               TRICUSPID VALVE MV Area (PHT): 3.77 cm    TR Peak grad:   25.0 mmHg  MV Decel Time: 201 msec    TR Vmax:        250.00 cm/s MV E velocity: 80.70 cm/s MV A velocity: 90.70 cm/s  SHUNTS MV E/A ratio:  0.89        Systemic VTI:  0.20 m                            Systemic Diam: 2.00 cm Sunit Tolia Electronically signed by Madonna Large Signature Date/Time: 07/12/2023/11:15:38 AM    Final    CT ANGIO HEAD NECK W WO CM (CODE STROKE) Result Date: 07/11/2023 CLINICAL DATA:  Code stroke, neuro deficit. Left-sided gaze and left-sided weakness. EXAM: CT ANGIOGRAPHY HEAD AND NECK WITH AND WITHOUT CONTRAST TECHNIQUE: Multidetector CT imaging of the head and neck was performed using the standard protocol during bolus administration of intravenous contrast. Multiplanar CT image reconstructions and MIPs were obtained to evaluate the vascular anatomy. Carotid stenosis measurements (when applicable) are obtained utilizing NASCET criteria, using the distal internal carotid diameter as the denominator. RADIATION DOSE REDUCTION: This exam was performed according to the departmental dose-optimization program which includes automated exposure control, adjustment of the mA and/or kV according to patient size and/or use of iterative reconstruction technique. CONTRAST:  75mL OMNIPAQUE  IOHEXOL  350 MG/ML SOLN COMPARISON:  Same-day head CT.  MRI head 07/03/2017. FINDINGS: CTA NECK FINDINGS Aortic arch: Standard configuration of the aortic arch. Imaged portion shows no evidence of aneurysm or dissection. Mild-to-moderate atherosclerosis of the visualized aortic arch. No significant stenosis of the major arch vessel origins. Pulmonary arteries: As permitted by contrast timing, there are no filling defects in the visualized pulmonary arteries. Subclavian arteries: Patent bilaterally. Atherosclerosis at the origin of the left subclavian artery. There is additional noncalcified atherosclerosis along the proximal aspect of the vessel resulting in moderate to severe stenosis. Right carotid system: The common carotid  artery is patent. External carotid artery branches are patent. There is abrupt cut off of the proximal cervical ICA approximately 5 mm distal to the bifurcation. The vessel remains occluded to the intracranial segment. Left carotid system: Patent from the origin to the skull base. Carotid bifurcation is patent. External carotid artery branches are patent. No high-grade stenosis of the internal carotid artery. Vertebral arteries: Right vertebral artery is dominant and is patent from the origin to the vertebrobasilar confluence. Atherosclerosis at the right vertebral artery origin without significant stenosis. Atherosclerosis at the origin of the non dominant left vertebral artery resulting in severe stenosis. There is multifocal irregularity and stenosis of the left V2 segment. Diminished caliber of the intracranial left vertebral artery after the origin of the left PICA. There is reconstitution of the distal left V4 segment just proximal to the vertebrobasilar confluence likely filled via retrograde flow. Skeleton: No acute findings. Degenerative changes in the cervical spine. Disc space narrowing most pronounced at C6-7. Edentulous maxilla and mandible. Other neck: The visualized airway is patent. No cervical lymphadenopathy. Upper chest: Visualized lung apices are clear. Review of the MIP images confirms the above findings CTA HEAD FINDINGS ANTERIOR CIRCULATION: The left internal carotid artery is patent from the cervical segment to the ICA terminus. Atherosclerosis along the carotid siphon resulting in mild-to-moderate stenosis of the anterior  genu of the cavernous segment and paraclinoid segment. The right ICA is occluded from the cervical segment to the distal cavernous/paraclinoid segment. Additional atherosclerosis noted within the right carotid siphon. MCAs: The right M1 segment is patent. The MCA bifurcation is patent on the right. There is diminished caliber of right MCA branches compared to the left.  Overall diminished number of second and third order branches of the right MCA compared to the left ACAs: Patent bilaterally. 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 Similar to the right MCA there is decreased caliber of distal right ACA branches compared to the left ACA. POSTERIOR CIRCULATION: No significant stenosis, proximal occlusion, aneurysm, or vascular malformation. PCAs: Patent bilaterally. Moderate stenosis of the P2 a segment of the right PCA. Additional mild stenosis of the P2 a segment left PCA. Pcomm: The posterior communicating arteries are visualized bilaterally. SCAs: Patent bilaterally. Moderate stenosis at the origin of the left SCA. Basilar artery: Patent AICAs: Patent PICAs: Patent Vertebral arteries: As above. Venous sinuses: As permitted by contrast timing, patent. 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 Reconstitution at the paraclinoid segment likely via the circle-of-Willis. There is decreased caliber of right MCA branches with fewer second and third order branches of the right MCA compared to the left. Similarly there are decreased caliber and decreased number of right ACA branches compared to the left. There is no focal occlusion identified within the right MCA or right ACA. Suspect findings are related to right ICA occlusion. 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 Multifocal stenosis and irregularity of the left V2 segment. 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 Moderate stenosis of the P2 segment right PCA. Moderate stenosis at the origin of the left SCA. Prominent noncalcified atherosclerotic plaque in the proximal left  subclavian artery resulting in moderate to severe stenosis. Aortic Atherosclerosis (ICD10-I70.0). Findings of right ICA occlusion and diminished caliber/branching of the right MCA were communicated to Dr. Michaela at 3:31 pm on 07/11/2023 by text page via the [facility] messaging system. Electronically Signed   By: Donnice Mania M.D.   On: 07/11/2023 15:54   CT HEAD CODE STROKE WO CONTRAST Result Date: 07/11/2023 CLINICAL DATA:  Code stroke.  Neuro deficit, concern for stroke. EXAM: CT HEAD WITHOUT CONTRAST TECHNIQUE: Contiguous axial images were obtained from the base of the skull through the vertex without intravenous contrast. RADIATION DOSE REDUCTION: This exam was performed according to the departmental dose-optimization program which includes automated exposure control, adjustment of the mA and/or kV according to patient size and/or use of iterative reconstruction technique. COMPARISON:  MRI head 07/03/2017. FINDINGS: Brain: No acute intracranial hemorrhage. No CT evidence of acute infarct. No edema, mass effect, or midline shift. The basilar cisterns are patent. Ventricles: The ventricles are normal. Vascular: Atherosclerotic calcifications of the carotid siphons. No hyperdense vessel. Skull: No acute or aggressive finding. Orbits: Orbits are symmetric. Sinuses: The visualized paranasal sinuses are clear. Other: Mastoid air cells are clear. ASPECTS [facility] Stroke Program Early CT Score) - Ganglionic level infarction (caudate, lentiform nuclei, internal capsule, insula, M1-M3 cortex): 7 - Supraganglionic infarction (M4-M6 cortex): 3 Total score (0-10 with 10 being normal): 10 IMPRESSION: 1. No CT evidence  of acute intracranial abnormality. 2. ASPECTS is 10 These results were communicated to Dr. Michaela at 3:12 pm on 07/11/2023 by text page via the [facility] messaging system. Electronically Signed   By: Donnice Mania M.D.   On: 07/11/2023 15:12       HISTORY OF PRESENT ILLNESS 73 y.o. patient with history of  diabetes, hypertension, hyperlipidemia and COPD admitted for acute onset left-sided weakness, left facial droop and right gaze preference.  Patient was given TNK to treat her stroke, and was found to have a right internal carotid occlusion.  Family declined interventional radiology given her baseline functional status.  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 ASPECTS 10.    CTA head & neck occlusion of the right ICA from proximal cervical segment to cavernous segment with reconstitution of paraclinoid segment likely via circle of Willis, decreased caliber and number of right MCA branches, decreased caliber in number of right ACA branches, severe stenosis at the origin of the left nondominant VA, multifocal stenosis and irregularity of the left V2 segment, likely chronic occlusion of left V4 segment after the origin of the left PICA, moderate stenosis of right PCA P2 segment, moderate stenosis of the origin of the left ICA MRI Scattered areas of acute infarct throughout the right MCA and right ACA territories. 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 cavernous segment with reconstitution at the paraclinoid segment noted on CT angiogram Likely acute on chronic Family declined interventions for this given baseline functional status On DAPT   Hypertension / hypotension Home meds: None Stable now  Avoid low BP given R ICA occlusion Long term BP goal normotensive   Hyperlipidemia Home meds: Atorvastatin  20 mg daily, increased to 40 LDL 110, goal  < 70 Continue statin at discharge   Diabetes type II poorly controlled Home meds: Insulin  70/30 6 units twice daily, metformin  1000 mg twice daily HgbA1c 12.3, goal < 7.0 CBGs SSI Recommend close follow-up with PCP for better DM control   Tobacco Abuse Patient chewing tobacco, large amount per daughters Tobacco cessation discussed with patient and family   Dysphagia Patient has post-stroke dysphagia SLP consulted Now on dysphagia 1 diet with thin liquids Eating well  DC tube feeding and NG tube 7/7   Other Stroke Risk Factors Advanced age   Other Active Problems Mild dementia - resume home aricept  COPD Mild leukocytosis WBC 10.9--9.3--10.4--10.3 Hyponatremia sodium 135--131--133   DISCHARGE EXAM PHYSICAL EXAM General:  Alert, well-nourished, well-developed elderly patient in no acute distress Psych:  Mood and affect appropriate for situation CV: Regular rate and rhythm on monitor Respiratory:  Regular, unlabored respirations on room air   NEURO:  Mental Status: Alert and oriented to person and place, gives the right month but the wrong age Speech/Language: speech is with mild dysarthria,     Cranial Nerves:  II: PERRL.  Blinks to threat bilaterally III, IV, VI: Right gaze preference but able to cross midline V: Sensation is intact to light touch and symmetrical to face.  VII: Left  facial droop VIII: hearing intact to voice. IX, X: Phonation is normal.  XII: tongue is midline without fasciculations. Motor: Able to move right upper and lower extremities with good antigravity strength, no movement of left upper extremity, withdraws left lower extremity to noxious Tone: is normal and bulk is normal Sensation- Intact to light touch bilaterally.  Coordination: FTN intact on the right, unable to perform on the left Gait- deferred   Most Recent NIH  1a Level of Conscious.: 0 1b LOC Questions: 1 1c LOC Commands: 0 2 Best Gaze: 1 3 Visual: 0 4 Facial Palsy: 0 5a  Motor Arm - left: 4 5b Motor Arm - Right: 0 6a Motor Leg - Left: 4 6b Motor Leg - Right: 0 7 Limb Ataxia: 0 8 Sensory: 0 9 Best Language: 0 10 Dysarthria: 1 11 Extinct. and Inatten.: 0 TOTAL: 11   Discharge Diet       Diet   DIET - DYS 1 Room service appropriate? No; Fluid consistency: Thin   liquids  DISCHARGE PLAN Disposition: Rehab aspirin  81 mg daily and clopidogrel  75 mg daily for secondary stroke prevention for 3 months then aspirin  81 mg daily alone. Ongoing stroke risk factor control by Primary Care Physician at time of discharge Follow-up PCP [name], [name] C, NP in 2 weeks. Follow-up in [facility] in 8 weeks, office to schedule an appointment. Able to see NP in clinic.  35 minutes were spent preparing discharge.   Karna Geralds DNP, ACNPC-AG  [facility]  ATTENDING NOTE: I reviewed above note and agree with the assessment and plan. Pt was seen and examined.   Daughter at bedside.  Patient mildly lethargic, however eyes open, awake alert, still has left hemiplegia but following commands.  On DAPT and statin.  Medically ready for CIR placement.  Follow-up at [facility].  For detailed assessment and plan, please refer to above as I have made changes wherever appropriate.   Ary Cummins, MD PhD Stroke Neurology 07/16/2023 6:41 PM

## 2023-07-16 NOTE — H&P (Signed)
 Physical Medicine and Rehabilitation Admission H&P        Chief Complaint  Patient presents with   Stroke with functional deficits      HPI:  Kathryn Mcguire is a 73 year old female with history of T2DM, HTN, COPD, biploar d/o, dementia who was admitted on 07/11/23 with acute onset of left sided weakness with facial droop and right gaze preference as well as witnessed fall by EMS. CTA head/neck showed occlusion of R-ICA from proximal cervical segment to cavernous segment, decrease in caliber of R-MCA branches and R-ACA c/w left suspected to be due to R-ICA occlusion. She received TNK. Family declined revascularization. 2 D echo showed EF > 75% with denegerative MV and negative bubble study. Hypotension treated with phenylephrine  and IVF to keep SBP> 120.  MRI brain done revealing scattered areas of acute infarct in left basal ganglia and within posterior aspect of L-ACA territory, mild edema and sulcal effacement in right frontoparietal lobes. Stroke felt to be due to large vessel disease and Dr. Jerri recommended DAPT X 3 months followed by ASA alone.    She was made NPO and NGT placed for nutritional support.  MBS performed on 07/06 to evaluate swallow and showed decreased sensation to trigger swallow with moderate oral residue and episode of sensed aspiration of thin liquids which was eliminated by straw. D1 nectar trialed and advanced to D1 thins but noted to be impulsive. She continues to have bouts of lethargy with stimulation to maintain attention. She continues to be limited by left sided weakness, right gaze preference w/left inattention, decline in baseline cognitive function, left lateral lean,  requires mod to max assist for ADLs and mod assist +2 for SPT.    Patient lives with husband and daughter. Has malnutrition in setting of dementia secondary to decreased appetite over the years and needs reminders to eat. Needed cognitive assistance.    Review of Systems  Unable to perform ROS:  Mental acuity  Constitutional:  Negative for fever.  Eyes:  Negative for blurred vision.  Cardiovascular:  Negative for chest pain.  Gastrointestinal:  Negative for heartburn.  Genitourinary:  Negative for dysuria.  Musculoskeletal:  Negative for myalgias.  Skin:  Negative for rash.  Neurological:  Positive for sensory change, speech change and weakness.  Psychiatric/Behavioral:  Positive for memory loss. The patient has insomnia.            Past Medical History:  Diagnosis Date   Arthritis     Bipolar depression (HCC)     COPD (chronic obstructive pulmonary disease) (HCC)     Dementia (HCC)     Diabetes mellitus without complication (HCC)     Elevated LFTs     Emphysema of lung (HCC)     GERD (gastroesophageal reflux disease)     History of adenomatous polyps of colon 10/15/2013   Hyperlipemia     Hypertension     UTI (lower urinary tract infection)                 Past Surgical History:  Procedure Laterality Date   COLONOSCOPY       ESOPHAGOGASTRODUODENOSCOPY       TONSILLECTOMY       TONSILLECTOMY AND ADENOIDECTOMY       TUBAL LIGATION                   Family History  Problem Relation Age of Onset   Diabetes Mother     Lung cancer Sister  Diabetes Daughter     Lupus Daughter     Migraines Daughter          chronic   Bursitis Daughter     Colon cancer Neg Hx     Stomach cancer Neg Hx     Esophageal cancer Neg Hx     Rectal cancer Neg Hx            Social History:  reports that she has been smoking cigarettes. Her smokeless tobacco use includes chew. She reports that she does not currently use alcohol. She reports that she does not use drugs. Allergies:  Allergies  No Known Allergies             Medications Prior to Admission  Medication Sig Dispense Refill   acetaminophen  (TYLENOL ) 325 MG tablet Take 2 tablets (650 mg total) by mouth every 6 (six) hours as needed for moderate pain. 30 tablet 0   atorvastatin  (LIPITOR) 20 MG tablet TAKE 1  TABLET BY MOUTH EVERY DAY (Patient taking differently: Take 20 mg by mouth every evening.) 90 tablet 1   donepezil  (ARICEPT ) 5 MG tablet TAKE ONE TABLET MOUTH DAILY X 1 MONTH THEN INCREASE TO 2 TABLET DAILY. (Patient taking differently: Take 5 mg by mouth at bedtime.) 180 tablet 1   Glucerna (GLUCERNA) LIQD Take 237 mLs by mouth 2 (two) times daily between meals. 1500 mL 3   insulin  NPH-regular Human (NOVOLIN  70/30) (70-30) 100 UNIT/ML injection Inject 6 Units into the skin 2 (two) times daily with a meal. 30 mL 1   metFORMIN  (GLUCOPHAGE ) 1000 MG tablet TAKE 1 TABLET (1,000 MG TOTAL) BY MOUTH TWICE A DAY WITH FOOD 180 tablet 1   aspirin  EC 81 MG tablet Take 1 tablet (81 mg total) by mouth daily. Swallow whole. (Patient not taking: Reported on 05/07/2023) 90 tablet 0   Blood Glucose Monitoring Suppl (ONE TOUCH ULTRA 2) w/Device KIT USE TO TEST BLOOD SUGAR ONCE DAILY 1 kit 0   glucose blood (ONETOUCH ULTRA) test strip USE TO TEST BLOOD SUGAR ONCE DAILY 100 strip 1   Insulin  Pen Needle 32G X 4 MM MISC 1 Device by Does not apply route in the morning and at bedtime. 100 each 3   Lancets (ONETOUCH ULTRASOFT) lancets Use to test blood sugar once daily DX: E11.9 100 each 1            Home: Home Living Family/patient expects to be discharged to:: Private residence Living Arrangements: Children Available Help at Discharge: Family, Available 24 hours/day Type of Home: House Home Access: Stairs to enter Secretary/administrator of Steps: 2 Entrance Stairs-Rails: None Home Layout: Two level Alternate Level Stairs-Number of Steps: 2 (between back entrance and kitchen) Alternate Level Stairs-Rails: None Bathroom Shower/Tub: Health visitor: Standard Home Equipment: Agricultural consultant (2 wheels), Grab bars - tub/shower Additional Comments: information provided by daughter  Lives With: Family   Functional History: Prior Function Prior Level of Function : Needs assist Mobility Comments:  ambulated no device ADLs Comments: cues to complete ADLs 2/2 dementia   Functional Status:  Mobility: Bed Mobility Overal bed mobility: Needs Assistance Bed Mobility: Rolling, Sidelying to Sit Rolling: Min assist, Used rails Sidelying to sit: Mod assist, HOB elevated, Used rails General bed mobility comments: mod A to raise trunk and advance BLE to EOB, cues needed to sequence properly. Transfers Overall transfer level: Needs assistance Equipment used: 2 person hand held assist Transfers: Sit to/from Stand, Bed to chair/wheelchair/BSC Sit to Stand: Mod  assist, +2 physical assistance Bed to/from chair/wheelchair/BSC transfer type:: Step pivot Step pivot transfers: Mod assist, +2 physical assistance, +2 safety/equipment General transfer comment: needs heavy mod A+2 to transfer and weight shift to offload and andvance RLE. LLE hyperextended in standing and unable to advance without assist Ambulation/Gait Pre-gait activities: pivotal steps only   ADL: ADL Overall ADL's : Needs assistance/impaired Eating/Feeding: Set up, Sitting Eating/Feeding Details (indicate cue type and reason): with RUE Grooming: Moderate assistance, Sitting Grooming Details (indicate cue type and reason): apply lotion to LUE, needs some assist to squeeze bottle. Upper Body Bathing: Moderate assistance, Sitting Lower Body Bathing: Maximal assistance, Sit to/from stand, Sitting/lateral leans Upper Body Dressing : Maximal assistance, Sitting, Cueing for sequencing Upper Body Dressing Details (indicate cue type and reason): doff/don gowns, cues to sequence first in and last out. Lower Body Dressing: Maximal assistance, Bed level (donning socks) Toilet Transfer: Moderate assistance, +2 for physical assistance, +2 for safety/equipment, Stand-pivot Functional mobility during ADLs: Moderate assistance, +2 for physical assistance, +2 for safety/equipment (pivot reclienr\)   Cognition: Cognition Overall Cognitive  Status: No family/caregiver present to determine baseline cognitive functioning Arousal/Alertness: Awake/alert Orientation Level: Oriented to place, Oriented to time, Disoriented to situation, Disoriented to time (knew it was July, disoriented to year) Attention: Sustained Sustained Attention: Impaired Sustained Attention Impairment: Verbal basic Memory: Impaired Memory Impairment: Decreased recall of new information Awareness: Impaired Awareness Impairment: Other (comment) (online) Comments: L inattention Cognition Arousal: Lethargic Behavior During Therapy: Flat affect Overall Cognitive Status: No family/caregiver present to determine baseline cognitive functioning     Blood pressure 101/76, pulse 78, temperature 98 F (36.7 C), temperature source Oral, resp. rate 16, height 5' 3 (1.6 m), weight 57.7 kg, SpO2 97%. Physical Exam Constitutional:      General: She is not in acute distress. HENT:     Head: Normocephalic and atraumatic.     Nose: Nose normal.     Mouth/Throat:     Mouth: Mucous membranes are moist.  Eyes:     Extraocular Movements: Extraocular movements intact.     Pupils: Pupils are equal, round, and reactive to light.  Cardiovascular:     Rate and Rhythm: Normal rate and regular rhythm.     Heart sounds: No murmur heard.    No gallop.  Pulmonary:     Effort: Pulmonary effort is normal. No respiratory distress.     Breath sounds: No wheezing.  Abdominal:     General: Bowel sounds are normal. There is no distension.     Tenderness: There is no abdominal tenderness.  Musculoskeletal:        General: No swelling or tenderness.     Cervical back: Normal range of motion.  Skin:    General: Skin is warm and dry.  Neurological:     Mental Status: She is alert.     Comments: Pt is alert. Fair insight. Easily distracted. Oriented to name. Follows basic commands. Told me she was in hospital with stroke. Recognizes family members. Speech sl dysarthric and  hypophonic. Mild left central VII. Left inattention. MMT: RUE 4-5/5 prox to distal. LUE 0/5 prox to distal. RLE 4 to 4+/5. LLE: inconsistent effort, at least 3-4/5 with motor planning issues. Decreased withdrawal to pain on left leg and arm. DTR's 1+. No abnl tone in left arm or leg. Toes down.   Psychiatric:     Comments: Flat but cooperative       Lab Results Last 48 Hours  Results for orders placed or performed during the hospital encounter of 07/11/23 (from the past 48 hours)  Glucose, capillary     Status: Abnormal    Collection Time: 07/14/23  3:17 PM  Result Value Ref Range    Glucose-Capillary 241 (H) 70 - 99 mg/dL      Comment: Glucose reference range applies only to samples taken after fasting for at least 8 hours.    Comment 1 Notify RN      Comment 2 Document in Chart    Glucose, capillary     Status: Abnormal    Collection Time: 07/14/23  8:16 PM  Result Value Ref Range    Glucose-Capillary 307 (H) 70 - 99 mg/dL      Comment: Glucose reference range applies only to samples taken after fasting for at least 8 hours.    Comment 1 Notify RN      Comment 2 Document in Chart    Glucose, capillary     Status: Abnormal    Collection Time: 07/14/23 10:20 PM  Result Value Ref Range    Glucose-Capillary 415 (H) 70 - 99 mg/dL      Comment: Glucose reference range applies only to samples taken after fasting for at least 8 hours.  Glucose, capillary     Status: Abnormal    Collection Time: 07/15/23 12:09 AM  Result Value Ref Range    Glucose-Capillary 369 (H) 70 - 99 mg/dL      Comment: Glucose reference range applies only to samples taken after fasting for at least 8 hours.    Comment 1 Notify RN      Comment 2 Document in Chart    Glucose, capillary     Status: Abnormal    Collection Time: 07/15/23  3:36 AM  Result Value Ref Range    Glucose-Capillary 190 (H) 70 - 99 mg/dL      Comment: Glucose reference range applies only to samples taken after fasting for at least 8  hours.    Comment 1 Notify RN      Comment 2 Document in Chart    Magnesium     Status: None    Collection Time: 07/15/23  5:00 AM  Result Value Ref Range    Magnesium 1.8 1.7 - 2.4 mg/dL      Comment: Performed at [facility], [address], [place], [place] [address]  Phosphorus     Status: None    Collection Time: 07/15/23  5:00 AM  Result Value Ref Range    Phosphorus 3.7 2.5 - 4.6 mg/dL      Comment: Performed at [facility], [address], [place], [place] [address]  Basic metabolic panel with GFR     Status: Abnormal    Collection Time: 07/15/23  5:00 AM  Result Value Ref Range    Sodium 133 (L) 135 - 145 mmol/L    Potassium 4.4 3.5 - 5.1 mmol/L    Chloride 99 98 - 111 mmol/L    CO2 22 22 - 32 mmol/L    Glucose, Bld 217 (H) 70 - 99 mg/dL      Comment: Glucose reference range applies only to samples taken after fasting for at least 8 hours.    BUN 21 8 - 23 mg/dL    Creatinine, Ser 9.36 0.44 - 1.00 mg/dL    Calcium  8.9 8.9 - 10.3 mg/dL    GFR, Estimated >39 >39 mL/min      Comment: (NOTE)  Calculated using the CKD-EPI Creatinine Equation (2021)      Anion gap 12 5 - 15      Comment: Performed at [facility], [address], [place], [place] [address]  CBC     Status: Abnormal    Collection Time: 07/15/23  5:00 AM  Result Value Ref Range    WBC 10.3 4.0 - 10.5 K/uL    RBC 4.40 3.87 - 5.11 MIL/uL    Hemoglobin 11.4 (L) 12.0 - 15.0 g/dL    HCT 64.2 (L) 63.9 - 46.0 %    MCV 81.1 80.0 - 100.0 fL    MCH 25.9 (L) 26.0 - 34.0 pg    MCHC 31.9 30.0 - 36.0 g/dL    RDW 86.2 88.4 - 84.4 %    Platelets 200 150 - 400 K/uL    nRBC 0.0 0.0 - 0.2 %      Comment: Performed at [facility], [address] [place], [place] [address]  Glucose, capillary     Status: Abnormal    Collection Time: 07/15/23  7:50 AM  Result Value Ref Range    Glucose-Capillary 257 (H) 70 - 99 mg/dL      Comment: Glucose reference range applies only to samples taken after  fasting for at least 8 hours.    Comment 1 Notify RN      Comment 2 Document in Chart    Glucose, capillary     Status: Abnormal    Collection Time: 07/15/23 12:15 PM  Result Value Ref Range    Glucose-Capillary 251 (H) 70 - 99 mg/dL      Comment: Glucose reference range applies only to samples taken after fasting for at least 8 hours.    Comment 1 Notify RN      Comment 2 Document in Chart    Glucose, capillary     Status: Abnormal    Collection Time: 07/15/23  5:29 PM  Result Value Ref Range    Glucose-Capillary 130 (H) 70 - 99 mg/dL      Comment: Glucose reference range applies only to samples taken after fasting for at least 8 hours.  Glucose, capillary     Status: Abnormal    Collection Time: 07/15/23  7:45 PM  Result Value Ref Range    Glucose-Capillary 188 (H) 70 - 99 mg/dL      Comment: Glucose reference range applies only to samples taken after fasting for at least 8 hours.    Comment 1 Notify RN    Glucose, capillary     Status: Abnormal    Collection Time: 07/15/23 11:25 PM  Result Value Ref Range    Glucose-Capillary 124 (H) 70 - 99 mg/dL      Comment: Glucose reference range applies only to samples taken after fasting for at least 8 hours.    Comment 1 Notify RN    Glucose, capillary     Status: Abnormal    Collection Time: 07/16/23  3:27 AM  Result Value Ref Range    Glucose-Capillary 132 (H) 70 - 99 mg/dL      Comment: Glucose reference range applies only to samples taken after fasting for at least 8 hours.    Comment 1 Notify RN    Basic metabolic panel with GFR     Status: Abnormal    Collection Time: 07/16/23  5:05 AM  Result Value Ref Range    Sodium 137 135 - 145 mmol/L    Potassium 4.3 3.5 -  5.1 mmol/L    Chloride 101 98 - 111 mmol/L    CO2 22 22 - 32 mmol/L    Glucose, Bld 124 (H) 70 - 99 mg/dL      Comment: Glucose reference range applies only to samples taken after fasting for at least 8 hours.    BUN 17 8 - 23 mg/dL    Creatinine, Ser 9.34 0.44 -  1.00 mg/dL    Calcium  9.3 8.9 - 10.3 mg/dL    GFR, Estimated >39 >39 mL/min      Comment: (NOTE) Calculated using the CKD-EPI Creatinine Equation (2021)      Anion gap 14 5 - 15      Comment: Performed at [facility], [address], [place], [place] [address]  CBC     Status: Abnormal    Collection Time: 07/16/23  5:05 AM  Result Value Ref Range    WBC 10.4 4.0 - 10.5 K/uL    RBC 4.76 3.87 - 5.11 MIL/uL    Hemoglobin 12.0 12.0 - 15.0 g/dL    HCT 61.2 63.9 - 53.9 %    MCV 81.3 80.0 - 100.0 fL    MCH 25.2 (L) 26.0 - 34.0 pg    MCHC 31.0 30.0 - 36.0 g/dL    RDW 86.1 88.4 - 84.4 %    Platelets 208 150 - 400 K/uL    nRBC 0.0 0.0 - 0.2 %      Comment: Performed at [facility], [address] [place], [place] [address]  Glucose, capillary     Status: Abnormal    Collection Time: 07/16/23  7:23 AM  Result Value Ref Range    Glucose-Capillary 138 (H) 70 - 99 mg/dL      Comment: Glucose reference range applies only to samples taken after fasting for at least 8 hours.  Glucose, capillary     Status: Abnormal    Collection Time: 07/16/23 11:53 AM  Result Value Ref Range    Glucose-Capillary 259 (H) 70 - 99 mg/dL      Comment: Glucose reference range applies only to samples taken after fasting for at least 8 hours.      Imaging Results (Last 48 hours)  No results found.         Blood pressure 101/76, pulse 78, temperature 98 F (36.7 C), temperature source Oral, resp. rate 16, height 5' 3 (1.6 m), weight 57.7 kg, SpO2 97%.   Medical Problem List and Plan: 1. 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  Antithrombotics: -DVT/anticoagulation:  Pharmaceutical: Lovenox              -antiplatelet therapy: DAPT X 3 months followed by ASA alone.  3. Pain Management: tylenol  prn 4. Mood/Behavior/Sleep: LCSW  to follow for evaluation and support.              --does get up 3-4 times every night (food/tobacco)             -antipsychotic agents: N/a 5. Neuropsych/cognition: This patient is not capable of making decisions on her own behalf. 6. Skin/Wound Care: Routine pressure relief measures.  7. Fluids/Electrolytes/Nutrition: Monitor I/O. Family reports great appetite-->eats all day long 8.  T2DM: Hgb A1c- 12.3.  Monitor BS ac/hs and use SSI for elevated BS             --Was on 70/30 insulin  6 units bid and metformin  1000 mg bid              -recent CBG's poorly controlled -begin metformin  250mg  bid in AM  9. Dysphagia: On D1, thin liquids.  --Assistance with feeding due to impulsivity and for aspiration precautions.  10. H/o bipolar d/o: Used to have manic episodes/wander, etc but stable off medications.  11. Hyperlipidemia: Continue increased dose lipitor 12. Dementia: On Aricept  (followed by Dr. Georjean in the past)             -per daughters pt was generally independent within the home, needing  supervision with bathing and some ADL's for safety.  13. COPD/Tobacco use: Chews 4 packs of tobacco daily.   14. Blood pressure: Monitor TID--keep SBP > 110. --avoid hypotension given R-ICA occlusion.  15. Mild leucocytosis: WBC up to 10.4 but no signs of fever or infection. Recheck WBC in am. 16. Mild hyponatremia: Na-130 @ admission and has resolved.  17. Elevated LDL: On lipitor.        Kathryn GORMAN Schmitz, PA-C 07/16/2023  I have personally performed a face to face diagnostic evaluation of this patient and formulated the key components of the plan.  Additionally, I have personally reviewed laboratory data, imaging studies, as well as relevant notes and concur with the physician assistant's documentation above.  The patient's status has not changed from the original H&P.  Any changes in documentation from the acute care chart have been noted above.  Arthea IVAR Gunther, MD, [place]

## 2023-07-16 NOTE — Progress Notes (Signed)
 Babs Arthea DASEN, MD  Physician Physical Medicine and Rehabilitation   Progress Notes    Addendum   Date of Service: 07/15/2023 12:15 PM  Related encounter: ED to Hosp-Admission (Current) from 07/11/2023 in [place] [facility]   Expand All Collapse All  Show:Clear all [x] Written[x] Templated[x] Copied  Added by: [x] Babs Arthea DASEN, MD  [] Hover for details          Physical Medicine and Rehabilitation Consult Reason for Consult: Left-sided weakness and impaired functional mobility Referring Physician: Jerri     HPI: Kathryn Mcguire is a 73 y.o. female with a history of hyper tension, diabetes, COPD who was admitted on 07/11/2023 with acute onset left-sided weakness, left facial droop, and right gaze preference.  Patient was given TNK and CT initially showed no acute abnormalities.  CTA of the head and neck showed occlusion of the right ICA from proximal cervical segment to cavernous segment with reconstitution of the paraclinoid segment likely via circle of Willis as well as decreased caliber number right MCA branches, decreased caliber in number of right ACA branches, severe stenosis at the origin of the left nondominant vertebral artery, multiple stenoses and irregularities of the left V2 segment, likely chronic occlusion of left V4 segment after the origin of the left PICA, moderate stenosis of the right PCA P2 segment, moderate stenosis of the origin of the left ICA.  MRI demonstrated scattered infarcts throughout the right MCA and right ACA territories.  There were additional punctate areas of acute infarct in the left basal ganglia and within the posterior aspect of the left ACA territory.  Neurology felt that this was a large vessel event.  Patient was placed on aspirin  and Plavix  for 3 months then aspirin  alone is recommended.  Avoidance of lower blood pressures given significant right ICA occlusion.  Hemoglobin A1c at admission was 12.3.  Patient on D1 nectar thick  liquid diet per speech-language pathology with nocturnal tube feeds.  Other issues include mild dementia, COPD, persistent mild leukocytosis and mild hyponatremia.  Patient worked with therapies over the weekend and was max assist for basic transfers and mobility.  Only pre-gait activities were attempted with pivot steps only.  Patient's required mod to max assist for basic ADLs.  Patient lives with his children, daughter is his caregiver.  They live in a two-level house with 2 steps to enter.  Patient does have a rolling walker but was ambulating without device prior to admission.  She did need some cueing to complete ADLs due to dementia.       Home: Home Living Family/patient expects to be discharged to:: Private residence Living Arrangements: Children (daughter is caregiver) Available Help at Discharge: Family, Available 24 hours/day Type of Home: House Home Access: Stairs to enter [employer] of Steps: 2 (back entrance) Entrance Stairs-Rails: None Home Layout: Two level Alternate Level Stairs-Number of Steps: 2 (between back entrance and kitchen) Alternate Level Stairs-Rails: None Bathroom Shower/Tub: Health visitor: Standard Home Equipment: Agricultural consultant (2 wheels), Grab bars - tub/shower Additional Comments: information provided by daughter  Functional History: Prior Function Prior Level of Function : Needs assist Mobility Comments: ambulated no device ADLs Comments: cues to complete ADLs 2/2 dementia Functional Status:  Mobility: Bed Mobility Overal bed mobility: Needs Assistance Bed Mobility: Rolling, Sidelying to Sit Rolling: Max assist Sidelying to sit: Max assist General bed mobility comments: rolled to left; assist for advancing torso throughout Transfers Overall transfer level: Needs assistance Equipment used: 2 person hand  held assist Transfers: Sit to/from Stand, Bed to chair/wheelchair/BSC Sit to Stand: Mod assist, +2 physical  assistance Bed to/from chair/wheelchair/BSC transfer type:: Step pivot Step pivot transfers: Max assist, +2 physical assistance General transfer comment: stepping to chair on her left required total assist to advance LLE, however did not buckle in wt-bearing; assist for balance when advancing each LE Ambulation/Gait Pre-gait activities: pivotal steps only   ADL: ADL Overall ADL's : Needs assistance/impaired Eating/Feeding: NPO Grooming: Moderate assistance, Sitting Upper Body Bathing: Moderate assistance, Sitting Lower Body Bathing: Maximal assistance, Sit to/from stand, Sitting/lateral leans Upper Body Dressing : Maximal assistance, Sitting Lower Body Dressing: Maximal assistance, Bed level (donning socks) Toilet Transfer: Moderate assistance, +2 for physical assistance, +2 for safety/equipment, Stand-pivot Functional mobility during ADLs: Moderate assistance, +2 for physical assistance, +2 for safety/equipment   Cognition: Cognition Overall Cognitive Status: No family/caregiver present to determine baseline cognitive functioning Arousal/Alertness: Awake/alert Orientation Level: Oriented to person Attention: Sustained Sustained Attention: Impaired Sustained Attention Impairment: Verbal basic Memory: Impaired Memory Impairment: Decreased recall of new information Awareness: Impaired Awareness Impairment: Other (comment) (online) Comments: L inattention Cognition Arousal: Lethargic Behavior During Therapy: Flat affect Overall Cognitive Status: No family/caregiver present to determine baseline cognitive functioning     Review of Systems  Unable to perform ROS: Mental acuity       Past Medical History:  Diagnosis Date   Arthritis     Bipolar depression (HCC)     COPD (chronic obstructive pulmonary disease) (HCC)     Dementia (HCC)     Diabetes mellitus without complication (HCC)     Elevated LFTs     Emphysema of lung (HCC)     GERD (gastroesophageal reflux disease)      History of adenomatous polyps of colon 10/15/2013   Hyperlipemia     Hypertension     UTI (lower urinary tract infection)               Past Surgical History:  Procedure Laterality Date   COLONOSCOPY       ESOPHAGOGASTRODUODENOSCOPY       TONSILLECTOMY       TONSILLECTOMY AND ADENOIDECTOMY       TUBAL LIGATION                 Family History  Problem Relation Age of Onset   Diabetes Mother     Lung cancer Sister     Diabetes Daughter     Lupus Daughter     Migraines Daughter          chronic   Bursitis Daughter     Colon cancer Neg Hx     Stomach cancer Neg Hx     Esophageal cancer Neg Hx     Rectal cancer Neg Hx          Social History:  reports that she has been smoking cigarettes. Her smokeless tobacco use includes chew. She reports that she does not currently use alcohol. She reports that she does not use drugs. Allergies:  Allergies  No Known Allergies         Medications Prior to Admission  Medication Sig Dispense Refill   acetaminophen  (TYLENOL ) 325 MG tablet Take 2 tablets (650 mg total) by mouth every 6 (six) hours as needed for moderate pain. 30 tablet 0   atorvastatin  (LIPITOR) 20 MG tablet TAKE 1 TABLET BY MOUTH EVERY DAY (Patient taking differently: Take 20 mg by mouth every evening.) 90 tablet 1   donepezil  (ARICEPT ) 5  MG tablet TAKE ONE TABLET MOUTH DAILY X 1 MONTH THEN INCREASE TO 2 TABLET DAILY. (Patient taking differently: Take 5 mg by mouth at bedtime.) 180 tablet 1   Glucerna (GLUCERNA) LIQD Take 237 mLs by mouth 2 (two) times daily between meals. 1500 mL 3   insulin  NPH-regular Human (NOVOLIN  70/30) (70-30) 100 UNIT/ML injection Inject 6 Units into the skin 2 (two) times daily with a meal. 30 mL 1   metFORMIN  (GLUCOPHAGE ) 1000 MG tablet TAKE 1 TABLET (1,000 MG TOTAL) BY MOUTH TWICE A DAY WITH FOOD 180 tablet 1   aspirin  EC 81 MG tablet Take 1 tablet (81 mg total) by mouth daily. Swallow whole. (Patient not taking: Reported on 05/07/2023) 90 tablet  0   Blood Glucose Monitoring Suppl (ONE TOUCH ULTRA 2) w/Device KIT USE TO TEST BLOOD SUGAR ONCE DAILY 1 kit 0   glucose blood (ONETOUCH ULTRA) test strip USE TO TEST BLOOD SUGAR ONCE DAILY 100 strip 1   Insulin  Pen Needle 32G X 4 MM MISC 1 Device by Does not apply route in the morning and at bedtime. 100 each 3   Lancets (ONETOUCH ULTRASOFT) lancets Use to test blood sugar once daily DX: E11.9 100 each 1            Blood pressure 118/69, pulse 72, temperature 97.7 F (36.5 C), resp. rate 19, height 5' 3 (1.6 m), weight 55.7 kg, SpO2 98%. Physical Exam Constitutional:      General: She is not in acute distress. HENT:     Head: Normocephalic.     Right Ear: External ear normal.     Left Ear: External ear normal.     Mouth/Throat:     Mouth: Mucous membranes are moist.  Eyes:     Extraocular Movements: Extraocular movements intact.     Pupils: Pupils are equal, round, and reactive to light.  Cardiovascular:     Rate and Rhythm: Normal rate.  Pulmonary:     Effort: Pulmonary effort is normal.  Abdominal:     Palpations: Abdomen is soft.  Musculoskeletal:        General: Swelling present.     Cervical back: Normal range of motion.  Skin:    General: Skin is warm.  Neurological:     Comments: Pt alert, able to tell me her address, where she was, why she was here, month/year. Limited awareness/insight overall. Left central VII with dysarthria. Speech low volume. Language seems intact.  Follows basic commands. Had difficulty getting her to initiate movement with her left arm and leg although left leg was crossed over the right when pulled back her cover. She did withdraw left leg when I pinched. DTR's 1+       Lab Results Last 24 Hours       Results for orders placed or performed during the hospital encounter of 07/11/23 (from the past 24 hours)  Glucose, capillary     Status: Abnormal    Collection Time: 07/14/23  3:17 PM  Result Value Ref Range    Glucose-Capillary 241 (H)  70 - 99 mg/dL    Comment 1 Notify RN      Comment 2 Document in Chart    Glucose, capillary     Status: Abnormal    Collection Time: 07/14/23  8:16 PM  Result Value Ref Range    Glucose-Capillary 307 (H) 70 - 99 mg/dL    Comment 1 Notify RN      Comment 2 Document in Chart  Glucose, capillary     Status: Abnormal    Collection Time: 07/14/23 10:20 PM  Result Value Ref Range    Glucose-Capillary 415 (H) 70 - 99 mg/dL  Glucose, capillary     Status: Abnormal    Collection Time: 07/15/23 12:09 AM  Result Value Ref Range    Glucose-Capillary 369 (H) 70 - 99 mg/dL    Comment 1 Notify RN      Comment 2 Document in Chart    Glucose, capillary     Status: Abnormal    Collection Time: 07/15/23  3:36 AM  Result Value Ref Range    Glucose-Capillary 190 (H) 70 - 99 mg/dL    Comment 1 Notify RN      Comment 2 Document in Chart    Magnesium     Status: None    Collection Time: 07/15/23  5:00 AM  Result Value Ref Range    Magnesium 1.8 1.7 - 2.4 mg/dL  Phosphorus     Status: None    Collection Time: 07/15/23  5:00 AM  Result Value Ref Range    Phosphorus 3.7 2.5 - 4.6 mg/dL  Basic metabolic panel with GFR     Status: Abnormal    Collection Time: 07/15/23  5:00 AM  Result Value Ref Range    Sodium 133 (L) 135 - 145 mmol/L    Potassium 4.4 3.5 - 5.1 mmol/L    Chloride 99 98 - 111 mmol/L    CO2 22 22 - 32 mmol/L    Glucose, Bld 217 (H) 70 - 99 mg/dL    BUN 21 8 - 23 mg/dL    Creatinine, Ser 9.36 0.44 - 1.00 mg/dL    Calcium  8.9 8.9 - 10.3 mg/dL    GFR, Estimated >39 >39 mL/min    Anion gap 12 5 - 15  CBC     Status: Abnormal    Collection Time: 07/15/23  5:00 AM  Result Value Ref Range    WBC 10.3 4.0 - 10.5 K/uL    RBC 4.40 3.87 - 5.11 MIL/uL    Hemoglobin 11.4 (L) 12.0 - 15.0 g/dL    HCT 64.2 (L) 63.9 - 46.0 %    MCV 81.1 80.0 - 100.0 fL    MCH 25.9 (L) 26.0 - 34.0 pg    MCHC 31.9 30.0 - 36.0 g/dL    RDW 86.2 88.4 - 84.4 %    Platelets 200 150 - 400 K/uL    nRBC 0.0 0.0 -  0.2 %  Glucose, capillary     Status: Abnormal    Collection Time: 07/15/23  7:50 AM  Result Value Ref Range    Glucose-Capillary 257 (H) 70 - 99 mg/dL    Comment 1 Notify RN      Comment 2 Document in Chart         Imaging Results (Last 48 hours)  DG Abd 1 View Result Date: 07/14/2023 CLINICAL DATA:  Nasogastric tube placement. EXAM: ABDOMEN - 1 VIEW COMPARISON:  Radiographs 07/14/2023 and 07/13/2023. FINDINGS: 1211 hours. The enteric tube has been advanced, tip overlying the expected location of the mid stomach. There is contrast material within nondistended small bowel. Grossly stable superior endplate compression deformity at L2. IMPRESSION: Enteric tube tip overlies the expected location of the mid stomach. Electronically Signed   By: Elsie Perone M.D.   On: 07/14/2023 12:31    DG Swallowing Func-Speech Pathology Result Date: 07/14/2023 Table formatting from the original result was not included. Modified Barium  Swallow Study Patient Details Name: JERICCA RUSSETT MRN: [id number] Date of Birth: 09/27/1950 Today's Date: 07/14/2023 HPI/PMH: HPI: 73 y.o. female presented 7/3 with L sided weakness, L facial droop, and R gaze preference s/p TNK. CTA revealed R ICA occlusion. PMH includes: bipolar, dementia, DM, HTN, HLD, COPD. Clinical Impression: Pt presents with a primary oral dysphagia with left sided lingual and facial weakness with additional sensory and attention impairment. Pt has disorganized lingual collection of bolus with passive spillage to the pyriform sinus and modreate oral residuals in the left floor of mouth and buccal cavity. There is an added pharyngeal impairment, likely related to decreased sensation to trigger swallow. Bolus pools in pharynx for 5 to 30 seconds, in one instance leading to sensed aspiration before the swallow with thin liquids. Cough did not immediately eject aspirate, but pt had prolonged coughing that continued to mobilize aspirate to upper trachea. Further  trials of straw sips of thin liquids were not aspirated. Pt benefits from cues to swallow twice: to clear oral residue and swallow.  Pt will be at risk of aspiraton of oral residue regardless of texture. Pt demonstrated improvement in timing of airway protection throughout study and would be more likely to mobilize aspirate of thin liquids. Advise initiating pureed solids (pt edentulous with severe oral weakness) and thin liquids, with full supervision and assist needed for oral intake. Focus on oral hygiene, water  intake, pt awareness and second swallow. If pt coughs, encourage harder coughing for pulmonary hygiene. Factors that may increase risk of adverse event in presence of aspiration Noe & Lianne 2021): Factors that may increase risk of adverse event in presence of aspiration Noe & Lianne 2021): Dependence for feeding and/or oral hygiene; Reduced cognitive function Recommendations/Plan: Swallowing Evaluation Recommendations Swallowing Evaluation Recommendations Recommendations: PO diet PO Diet Recommendation: Dysphagia 1 (Pureed); Thin liquids (Level 0) Liquid Administration via: Straw Medication Administration: Crushed with puree Supervision: Full assist for feeding; Full supervision/cueing for swallowing strategies Swallowing strategies  : Slow rate; Small bites/sips; Check for pocketing or oral holding; Check for anterior loss; Multiple dry swallows after each bite/sip; Hard cough after swallowing Postural changes: Stay upright 30-60 min after meals; Position pt fully upright for meals Oral care recommendations: Oral care before PO; Oral care QID (4x/day) Caregiver Recommendations: Have oral suction available Treatment Plan Treatment Plan Treatment recommendations: Therapy as outlined in treatment plan below Follow-up recommendations: Acute inpatient rehab (3 hours/day) Treatment frequency: Min 2x/week Treatment duration: 2 weeks Recommendations Recommendations for follow up therapy are one  component of a multi-disciplinary discharge planning process, led by the attending physician.  Recommendations may be updated based on patient status, additional functional criteria and insurance authorization. Assessment: Orofacial Exam: Orofacial Exam Oral Cavity: Oral Hygiene: WFL Oral Cavity - Dentition: Edentulous Oral Motor/Sensory Function: Suspected cranial nerve impairment CN V - Trigeminal: Left sensory impairment CN VII - Facial: Left motor impairment CN IX - Glossopharyngeal, CN X - Vagus: Left motor impairment CN XII - Hypoglossal: Left motor impairment Anatomy: Anatomy: WFL Boluses Administered: Boluses Administered Boluses Administered: Thin liquids (Level 0); Mildly thick liquids (Level 2, nectar thick); Moderately thick liquids (Level 3, honey thick); Puree; Solid  Oral Impairment Domain: Oral Impairment Domain Lip Closure: Escape beyond mid-chin Tongue control during bolus hold: Not tested Bolus preparation/mastication: -- (NT) Bolus transport/lingual motion: Repetitive/disorganized tongue motion; Slow tongue motion Oral residue: Residue collection on oral structures Location of oral residue : Floor of mouth; Lateral sulci Initiation of pharyngeal swallow : Pyriform sinuses  Pharyngeal  Impairment Domain: Pharyngeal Impairment Domain Soft palate elevation: No bolus between soft palate (SP)/pharyngeal wall (PW) Laryngeal elevation: Complete superior movement of thyroid  cartilage with complete approximation of arytenoids to epiglottic petiole Anterior hyoid excursion: Complete anterior movement Epiglottic movement: No inversion Laryngeal vestibule closure: Complete, no air/contrast in laryngeal vestibule Pharyngeal stripping wave : Present - complete Pharyngoesophageal segment opening: Complete distension and complete duration, no obstruction of flow Tongue base retraction: Trace column of contrast or air between tongue base and PPW Pharyngeal residue: Trace residue within or on pharyngeal structures  Location of pharyngeal residue: Valleculae  Esophageal Impairment Domain: Esophageal Impairment Domain Esophageal clearance upright position: -- (NT) Pill: No data recorded Penetration/Aspiration Scale Score: Penetration/Aspiration Scale Score 1.  Material does not enter airway: Mildly thick liquids (Level 2, nectar thick); Moderately thick liquids (Level 3, honey thick); Puree; Thin liquids (Level 0) 2.  Material enters airway, remains ABOVE vocal cords then ejected out: Thin liquids (Level 0); Mildly thick liquids (Level 2, nectar thick) 7.  Material enters airway, passes BELOW cords and not ejected out despite cough attempt by patient: Thin liquids (Level 0) Compensatory Strategies: No data recorded  General Information: Caregiver present: No  Diet Prior to this Study: NPO   Temperature : Normal   Respiratory Status: WFL   Supplemental O2: None (Room air)   History of Recent Intubation: No  Behavior/Cognition: Alert; Cooperative; Requires cueing Self-Feeding Abilities: Needs assist with self-feeding Baseline vocal quality/speech: Not observed No data recorded Volitional Swallow: Able to elicit Exam Limitations: Excessive movement Goal Planning: Prognosis for improved oropharyngeal function: Good Barriers to Reach Goals: Cognitive deficits No data recorded No data recorded Consulted and agree with results and recommendations: Pt unable/family or caregiver not available Pain: Pain Assessment Pain Assessment: No/denies pain Faces Pain Scale: 0 End of Session: Start Time:SLP Start Time (ACUTE ONLY): 1030 Stop Time: SLP Stop Time (ACUTE ONLY): 1050 Time Calculation:SLP Time Calculation (min) (ACUTE ONLY): 20 min Charges: SLP Evaluations $ SLP Speech Visit: 1 Visit SLP Evaluations $MBS Swallow: 1 Procedure $Swallowing Treatment: 1 Procedure SLP visit diagnosis: SLP Visit Diagnosis: Dysphagia, oropharyngeal phase (R13.12) Past Medical History: Past Medical History: Diagnosis Date  Arthritis   Bipolar depression (HCC)    COPD (chronic obstructive pulmonary disease) (HCC)   Dementia (HCC)   Diabetes mellitus without complication (HCC)   Elevated LFTs   Emphysema of lung (HCC)   GERD (gastroesophageal reflux disease)   History of adenomatous polyps of colon 10/15/2013  Hyperlipemia   Hypertension   UTI (lower urinary tract infection)  Past Surgical History: Past Surgical History: Procedure Laterality Date  COLONOSCOPY    ESOPHAGOGASTRODUODENOSCOPY    TONSILLECTOMY    TONSILLECTOMY AND ADENOIDECTOMY    TUBAL LIGATION   DeBlois, Consuelo Fitch 07/14/2023, 11:13 AM   DG Abd 1 View Result Date: 07/14/2023 CLINICAL DATA:  NG tube placement EXAM: ABDOMEN - 1 VIEW COMPARISON:  07/13/2023 FINDINGS: Limited field of view for tube placement verification purposes. An enteric tube is present with tip projecting over the left upper quadrant consistent with location in the upper stomach. This has been advanced since the prior study. Scattered gas and stool throughout the colon. No small or large bowel distention. Visualized lung bases are clear. Degenerative changes in the spine. IMPRESSION: Enteric tube tip projects over the left upper quadrant consistent with location in the upper stomach. Electronically Signed   By: Elsie Gravely M.D.   On: 07/14/2023 01:55    DG Abd 1 View Result Date: 07/13/2023 CLINICAL  DATA:  Initial evaluation for NG tube placement EXAM: ABDOMEN - 1 VIEW COMPARISON:  Prior radiograph from 07/12/2023 FINDINGS: Enteric tube in place and is seen coiled back on itself above the diaphragm, likely in the distal esophagus. Tip projects cephalad at. Repositioning recommended. Visualized bowel gas pattern is nonobstructive. No soft tissue mass or abnormal calcification Visualized lungs are clear. IMPRESSION: Enteric tube coiled back on itself above the diaphragm, likely in the distal esophagus. Repositioning recommended. Electronically Signed   By: Morene Hoard M.D.   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 Yes Co-Morbidities requiring supervision/potential complications:  - Blood pressure monitoring and control to optimize perfusion and risk factors - Poorly controlled diabetes type 2 - history of tobacco use -mild dementia -COPD -post-stroke dysphagia Due to bladder management, bowel management, safety, skin/wound care, disease management, medication administration, pain management, and patient education, does the patient require 24 hr/day rehab nursing? Yes Does the patient require coordinated care of a physician, rehab nurse, therapy disciplines of PT, OT, SLP to address physical and functional deficits in the context of the above medical diagnosis(es)? Yes Addressing deficits in the following areas: balance, endurance, locomotion, strength, transferring, bowel/bladder control, bathing, dressing, feeding, grooming, toileting, cognition, speech, and psychosocial support, swallowing. Can the patient actively participate in an intensive therapy program of at least 3 hrs of therapy per day at least 5 days per week? Yes The potential for patient to make measurable gains while on inpatient rehab is excellent Anticipated functional outcomes upon discharge from inpatient rehab are supervision and min assist  with PT, min assist with OT, supervision and min assist with SLP. Estimated rehab length of stay to reach the above functional goals is: 17-23 days Anticipated discharge destination: Home Overall Rehab/Functional Prognosis: good   POST ACUTE RECOMMENDATIONS: This patient's condition is appropriate for continued rehabilitative care in the following setting: CIR Patient has agreed to participate in recommended  program. Potentially Note that insurance prior authorization may be required for reimbursement for recommended care.   Comment: Pt was ambulating independently at home prior to this admit and only needed some cueing to complete ADL's. If family can provide projected assist at home, she's an ideal candidate for inpatient rehab. Rehab Admissions Coordinator to follow up.         I have personally performed a face to face diagnostic evaluation of this patient. Additionally, I have examined the patient's medical record including any pertinent labs and radiographic images.     Thanks,   Arthea ONEIDA Gunther, MD 07/15/2023       Revision History

## 2023-07-16 NOTE — Plan of Care (Signed)
  Problem: Ischemic Stroke/TIA Tissue Perfusion: Goal: Complications of ischemic stroke/TIA will be minimized Outcome: Progressing   Problem: Coping: Goal: Will verbalize positive feelings about self Outcome: Progressing   Problem: Health Behavior/Discharge Planning: Goal: Ability to manage health-related needs will improve Outcome: Progressing   Problem: Self-Care: Goal: Ability to participate in self-care as condition permits will improve Outcome: Progressing   Problem: Self-Care: Goal: Ability to communicate needs accurately will improve Outcome: Progressing   Problem: Nutrition: Goal: Risk of aspiration will decrease Outcome: Progressing   Problem: Clinical Measurements: Goal: Will remain free from infection Outcome: Progressing   Problem: Activity: Goal: Risk for activity intolerance will decrease Outcome: Progressing   Problem: Skin Integrity: Goal: Risk for impaired skin integrity will decrease Outcome: Progressing

## 2023-07-16 NOTE — H&P (Signed)
 Physical Medicine and Rehabilitation Admission H&P    Chief Complaint  Patient presents with   Stroke with functional deficits    HPI:  Kathryn Mcguire is a 73 year old female with history of T2DM, HTN, COPD, biploar d/o, dementia who was admitted on 07/11/23 with acute onset of left sided weakness with facial droop and right gaze preference as well as witnessed fall by EMS. CTA head/neck showed occlusion of R-ICA from proximal cervical segment to cavernous segment, decrease in caliber of R-MCA branches and R-ACA c/w left suspected to be due to R-ICA occlusion. She received TNK. Family declined revascularization. 2 D echo showed EF > 75% with denegerative MV and negative bubble study. Hypotension treated with phenylephrine  and IVF to keep SBP> 120.  MRI brain done revealing scattered areas of acute infarct in left basal ganglia and within posterior aspect of L-ACA territory, mild edema and sulcal effacement in right frontoparietal lobes. Stroke felt to be due to large vessel disease and Dr. Jerri recommended DAPT X 3 months followed by ASA alone.   She was made NPO and NGT placed for nutritional support.  MBS performed on 07/06 to evaluate swallow and showed decreased sensation to trigger swallow with moderate oral residue and episode of sensed aspiration of thin liquids which was eliminated by straw. D1 nectar trialed and advanced to D1 thins but noted to be impulsive. She continues to have bouts of lethargy with stimulation to maintain attention. She continues to be limited by left sided weakness, right gaze preference w/left inattention, decline in baseline cognitive function, left lateral lean,  requires mod to max assist for ADLs and mod assist +2 for SPT.   Patient lives with husband and daughter. Has malnutrition in setting of dementia secondary to decreased appetite over the years and needs reminders to eat. Needed cognitive assistance.   Review of Systems  Unable to perform ROS: Mental  acuity  Constitutional:  Negative for fever.  Eyes:  Negative for blurred vision.  Cardiovascular:  Negative for chest pain.  Gastrointestinal:  Negative for heartburn.  Genitourinary:  Negative for dysuria.  Musculoskeletal:  Negative for myalgias.  Skin:  Negative for rash.  Neurological:  Positive for sensory change, speech change and weakness.  Psychiatric/Behavioral:  Positive for memory loss. The patient has insomnia.      Past Medical History:  Diagnosis Date   Arthritis    Bipolar depression (HCC)    COPD (chronic obstructive pulmonary disease) (HCC)    Dementia (HCC)    Diabetes mellitus without complication (HCC)    Elevated LFTs    Emphysema of lung (HCC)    GERD (gastroesophageal reflux disease)    History of adenomatous polyps of colon 10/15/2013   Hyperlipemia    Hypertension    UTI (lower urinary tract infection)     Past Surgical History:  Procedure Laterality Date   COLONOSCOPY     ESOPHAGOGASTRODUODENOSCOPY     TONSILLECTOMY     TONSILLECTOMY AND ADENOIDECTOMY     TUBAL LIGATION      Family History  Problem Relation Age of Onset   Diabetes Mother    Lung cancer Sister    Diabetes Daughter    Lupus Daughter    Migraines Daughter        chronic   Bursitis Daughter    Colon cancer Neg Hx    Stomach cancer Neg Hx    Esophageal cancer Neg Hx    Rectal cancer Neg Hx  Social History:  reports that she has been smoking cigarettes. Her smokeless tobacco use includes chew. She reports that she does not currently use alcohol. She reports that she does not use drugs. Allergies: No Known Allergies   Medications Prior to Admission  Medication Sig Dispense Refill   acetaminophen  (TYLENOL ) 325 MG tablet Take 2 tablets (650 mg total) by mouth every 6 (six) hours as needed for moderate pain. 30 tablet 0   atorvastatin  (LIPITOR) 20 MG tablet TAKE 1 TABLET BY MOUTH EVERY DAY (Patient taking differently: Take 20 mg by mouth every evening.) 90 tablet 1    donepezil  (ARICEPT ) 5 MG tablet TAKE ONE TABLET MOUTH DAILY X 1 MONTH THEN INCREASE TO 2 TABLET DAILY. (Patient taking differently: Take 5 mg by mouth at bedtime.) 180 tablet 1   Glucerna (GLUCERNA) LIQD Take 237 mLs by mouth 2 (two) times daily between meals. 1500 mL 3   insulin  NPH-regular Human (NOVOLIN  70/30) (70-30) 100 UNIT/ML injection Inject 6 Units into the skin 2 (two) times daily with a meal. 30 mL 1   metFORMIN  (GLUCOPHAGE ) 1000 MG tablet TAKE 1 TABLET (1,000 MG TOTAL) BY MOUTH TWICE A DAY WITH FOOD 180 tablet 1   aspirin  EC 81 MG tablet Take 1 tablet (81 mg total) by mouth daily. Swallow whole. (Patient not taking: Reported on 05/07/2023) 90 tablet 0   Blood Glucose Monitoring Suppl (ONE TOUCH ULTRA 2) w/Device KIT USE TO TEST BLOOD SUGAR ONCE DAILY 1 kit 0   glucose blood (ONETOUCH ULTRA) test strip USE TO TEST BLOOD SUGAR ONCE DAILY 100 strip 1   Insulin  Pen Needle 32G X 4 MM MISC 1 Device by Does not apply route in the morning and at bedtime. 100 each 3   Lancets (ONETOUCH ULTRASOFT) lancets Use to test blood sugar once daily DX: E11.9 100 each 1     Home: Home Living Family/patient expects to be discharged to:: Private residence Living Arrangements: Children Available Help at Discharge: Family, Available 24 hours/day Type of Home: House Home Access: Stairs to enter Secretary/administrator of Steps: 2 Entrance Stairs-Rails: None Home Layout: Two level Alternate Level Stairs-Number of Steps: 2 (between back entrance and kitchen) Alternate Level Stairs-Rails: None Bathroom Shower/Tub: Health visitor: Standard Home Equipment: Agricultural consultant (2 wheels), Grab bars - tub/shower Additional Comments: information provided by daughter  Lives With: Family   Functional History: Prior Function Prior Level of Function : Needs assist Mobility Comments: ambulated no device ADLs Comments: cues to complete ADLs 2/2 dementia  Functional Status:  Mobility: Bed  Mobility Overal bed mobility: Needs Assistance Bed Mobility: Rolling, Sidelying to Sit Rolling: Min assist, Used rails Sidelying to sit: Mod assist, HOB elevated, Used rails General bed mobility comments: mod A to raise trunk and advance BLE to EOB, cues needed to sequence properly. Transfers Overall transfer level: Needs assistance Equipment used: 2 person hand held assist Transfers: Sit to/from Stand, Bed to chair/wheelchair/BSC Sit to Stand: Mod assist, +2 physical assistance Bed to/from chair/wheelchair/BSC transfer type:: Step pivot Step pivot transfers: Mod assist, +2 physical assistance, +2 safety/equipment General transfer comment: needs heavy mod A+2 to transfer and weight shift to offload and andvance RLE. LLE hyperextended in standing and unable to advance without assist Ambulation/Gait Pre-gait activities: pivotal steps only    ADL: ADL Overall ADL's : Needs assistance/impaired Eating/Feeding: Set up, Sitting Eating/Feeding Details (indicate cue type and reason): with RUE Grooming: Moderate assistance, Sitting Grooming Details (indicate cue type and reason): apply lotion to LUE,  needs some assist to squeeze bottle. Upper Body Bathing: Moderate assistance, Sitting Lower Body Bathing: Maximal assistance, Sit to/from stand, Sitting/lateral leans Upper Body Dressing : Maximal assistance, Sitting, Cueing for sequencing Upper Body Dressing Details (indicate cue type and reason): doff/don gowns, cues to sequence first in and last out. Lower Body Dressing: Maximal assistance, Bed level (donning socks) Toilet Transfer: Moderate assistance, +2 for physical assistance, +2 for safety/equipment, Stand-pivot Functional mobility during ADLs: Moderate assistance, +2 for physical assistance, +2 for safety/equipment (pivot reclienr\)  Cognition: Cognition Overall Cognitive Status: No family/caregiver present to determine baseline cognitive functioning Arousal/Alertness:  Awake/alert Orientation Level: Oriented to place, Oriented to time, Disoriented to situation, Disoriented to time (knew it was July, disoriented to year) Attention: Sustained Sustained Attention: Impaired Sustained Attention Impairment: Verbal basic Memory: Impaired Memory Impairment: Decreased recall of new information Awareness: Impaired Awareness Impairment: Other (comment) (online) Comments: L inattention Cognition Arousal: Lethargic Behavior During Therapy: Flat affect Overall Cognitive Status: No family/caregiver present to determine baseline cognitive functioning   Blood pressure 101/76, pulse 78, temperature 98 F (36.7 C), temperature source Oral, resp. rate 16, height 5' 3 (1.6 m), weight 57.7 kg, SpO2 97%. Physical Exam Constitutional:      General: She is not in acute distress. HENT:     Head: Normocephalic and atraumatic.     Nose: Nose normal.     Mouth/Throat:     Mouth: Mucous membranes are moist.  Eyes:     Extraocular Movements: Extraocular movements intact.     Pupils: Pupils are equal, round, and reactive to light.  Cardiovascular:     Rate and Rhythm: Normal rate and regular rhythm.     Heart sounds: No murmur heard.    No gallop.  Pulmonary:     Effort: Pulmonary effort is normal. No respiratory distress.     Breath sounds: No wheezing.  Abdominal:     General: Bowel sounds are normal. There is no distension.     Tenderness: There is no abdominal tenderness.  Musculoskeletal:        General: No swelling or tenderness.     Cervical back: Normal range of motion.  Skin:    General: Skin is warm and dry.  Neurological:     Mental Status: She is alert.     Comments: Pt is alert. Fair insight. Easily distracted. Oriented to name. Follows basic commands. Told me she was in hospital with stroke. Recognizes family members. Speech sl dysarthric and hypophonic. Mild left central VII. Left inattention. MMT: RUE 4-5/5 prox to distal. LUE 0/5 prox to distal. RLE  4 to 4+/5. LLE: inconsistent effort, at least 3-4/5 with motor planning issues. Decreased withdrawal to pain on left leg and arm. DTR's 1+. No abnl tone in left arm or leg. Toes down.   Psychiatric:     Comments: Flat but cooperative     Results for orders placed or performed during the hospital encounter of 07/11/23 (from the past 48 hours)  Glucose, capillary     Status: Abnormal   Collection Time: 07/14/23  3:17 PM  Result Value Ref Range   Glucose-Capillary 241 (H) 70 - 99 mg/dL    Comment: Glucose reference range applies only to samples taken after fasting for at least 8 hours.   Comment 1 Notify RN    Comment 2 Document in Chart   Glucose, capillary     Status: Abnormal   Collection Time: 07/14/23  8:16 PM  Result Value Ref Range   Glucose-Capillary  307 (H) 70 - 99 mg/dL    Comment: Glucose reference range applies only to samples taken after fasting for at least 8 hours.   Comment 1 Notify RN    Comment 2 Document in Chart   Glucose, capillary     Status: Abnormal   Collection Time: 07/14/23 10:20 PM  Result Value Ref Range   Glucose-Capillary 415 (H) 70 - 99 mg/dL    Comment: Glucose reference range applies only to samples taken after fasting for at least 8 hours.  Glucose, capillary     Status: Abnormal   Collection Time: 07/15/23 12:09 AM  Result Value Ref Range   Glucose-Capillary 369 (H) 70 - 99 mg/dL    Comment: Glucose reference range applies only to samples taken after fasting for at least 8 hours.   Comment 1 Notify RN    Comment 2 Document in Chart   Glucose, capillary     Status: Abnormal   Collection Time: 07/15/23  3:36 AM  Result Value Ref Range   Glucose-Capillary 190 (H) 70 - 99 mg/dL    Comment: Glucose reference range applies only to samples taken after fasting for at least 8 hours.   Comment 1 Notify RN    Comment 2 Document in Chart   Magnesium     Status: None   Collection Time: 07/15/23  5:00 AM  Result Value Ref Range   Magnesium 1.8 1.7 - 2.4  mg/dL    Comment: Performed at [facility], [address] [place], [place] [address]  Phosphorus     Status: None   Collection Time: 07/15/23  5:00 AM  Result Value Ref Range   Phosphorus 3.7 2.5 - 4.6 mg/dL    Comment: Performed at [facility], [address] [place], [place] [address]  Basic metabolic panel with GFR     Status: Abnormal   Collection Time: 07/15/23  5:00 AM  Result Value Ref Range   Sodium 133 (L) 135 - 145 mmol/L   Potassium 4.4 3.5 - 5.1 mmol/L   Chloride 99 98 - 111 mmol/L   CO2 22 22 - 32 mmol/L   Glucose, Bld 217 (H) 70 - 99 mg/dL    Comment: Glucose reference range applies only to samples taken after fasting for at least 8 hours.   BUN 21 8 - 23 mg/dL   Creatinine, Ser 9.36 0.44 - 1.00 mg/dL   Calcium  8.9 8.9 - 10.3 mg/dL   GFR, Estimated >39 >39 mL/min    Comment: (NOTE) Calculated using the CKD-EPI Creatinine Equation (2021)    Anion gap 12 5 - 15    Comment: Performed at [facility], [address] [place], [place] [address]  CBC     Status: Abnormal   Collection Time: 07/15/23  5:00 AM  Result Value Ref Range   WBC 10.3 4.0 - 10.5 K/uL   RBC 4.40 3.87 - 5.11 MIL/uL   Hemoglobin 11.4 (L) 12.0 - 15.0 g/dL   HCT 64.2 (L) 63.9 - 53.9 %   MCV 81.1 80.0 - 100.0 fL   MCH 25.9 (L) 26.0 - 34.0 pg   MCHC 31.9 30.0 - 36.0 g/dL   RDW 86.2 88.4 - 84.4 %   Platelets 200 150 - 400 K/uL   nRBC 0.0 0.0 - 0.2 %    Comment: Performed at [facility], [address], [place], [place] [address]  Glucose, capillary     Status: Abnormal  Collection Time: 07/15/23  7:50 AM  Result Value Ref Range   Glucose-Capillary 257 (H) 70 - 99 mg/dL    Comment: Glucose reference range applies only to samples taken after fasting for at least 8 hours.   Comment 1 Notify RN    Comment 2 Document in Chart   Glucose, capillary     Status: Abnormal   Collection Time: 07/15/23 12:15 PM  Result Value Ref Range   Glucose-Capillary 251 (H) 70 - 99 mg/dL     Comment: Glucose reference range applies only to samples taken after fasting for at least 8 hours.   Comment 1 Notify RN    Comment 2 Document in Chart   Glucose, capillary     Status: Abnormal   Collection Time: 07/15/23  5:29 PM  Result Value Ref Range   Glucose-Capillary 130 (H) 70 - 99 mg/dL    Comment: Glucose reference range applies only to samples taken after fasting for at least 8 hours.  Glucose, capillary     Status: Abnormal   Collection Time: 07/15/23  7:45 PM  Result Value Ref Range   Glucose-Capillary 188 (H) 70 - 99 mg/dL    Comment: Glucose reference range applies only to samples taken after fasting for at least 8 hours.   Comment 1 Notify RN   Glucose, capillary     Status: Abnormal   Collection Time: 07/15/23 11:25 PM  Result Value Ref Range   Glucose-Capillary 124 (H) 70 - 99 mg/dL    Comment: Glucose reference range applies only to samples taken after fasting for at least 8 hours.   Comment 1 Notify RN   Glucose, capillary     Status: Abnormal   Collection Time: 07/16/23  3:27 AM  Result Value Ref Range   Glucose-Capillary 132 (H) 70 - 99 mg/dL    Comment: Glucose reference range applies only to samples taken after fasting for at least 8 hours.   Comment 1 Notify RN   Basic metabolic panel with GFR     Status: Abnormal   Collection Time: 07/16/23  5:05 AM  Result Value Ref Range   Sodium 137 135 - 145 mmol/L   Potassium 4.3 3.5 - 5.1 mmol/L   Chloride 101 98 - 111 mmol/L   CO2 22 22 - 32 mmol/L   Glucose, Bld 124 (H) 70 - 99 mg/dL    Comment: Glucose reference range applies only to samples taken after fasting for at least 8 hours.   BUN 17 8 - 23 mg/dL   Creatinine, Ser 9.34 0.44 - 1.00 mg/dL   Calcium  9.3 8.9 - 10.3 mg/dL   GFR, Estimated >39 >39 mL/min    Comment: (NOTE) Calculated using the CKD-EPI Creatinine Equation (2021)    Anion gap 14 5 - 15    Comment: Performed at [facility], [address] [place], [place] [address]  CBC      Status: Abnormal   Collection Time: 07/16/23  5:05 AM  Result Value Ref Range   WBC 10.4 4.0 - 10.5 K/uL   RBC 4.76 3.87 - 5.11 MIL/uL   Hemoglobin 12.0 12.0 - 15.0 g/dL   HCT 61.2 63.9 - 53.9 %   MCV 81.3 80.0 - 100.0 fL   MCH 25.2 (L) 26.0 - 34.0 pg   MCHC 31.0 30.0 - 36.0 g/dL   RDW 86.1 88.4 - 84.4 %   Platelets 208 150 - 400 K/uL   nRBC 0.0 0.0 - 0.2 %  Comment: Performed at [facility], [address], [place], [place] [address]  Glucose, capillary     Status: Abnormal   Collection Time: 07/16/23  7:23 AM  Result Value Ref Range   Glucose-Capillary 138 (H) 70 - 99 mg/dL    Comment: Glucose reference range applies only to samples taken after fasting for at least 8 hours.  Glucose, capillary     Status: Abnormal   Collection Time: 07/16/23 11:53 AM  Result Value Ref Range   Glucose-Capillary 259 (H) 70 - 99 mg/dL    Comment: Glucose reference range applies only to samples taken after fasting for at least 8 hours.   No results found.    Blood pressure 101/76, pulse 78, temperature 98 F (36.7 C), temperature source Oral, resp. rate 16, height 5' 3 (1.6 m), weight 57.7 kg, SpO2 97%.  Medical Problem List and Plan: 1. 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  Antithrombotics: -DVT/anticoagulation:  Pharmaceutical: Lovenox   -antiplatelet therapy: DAPT X 3 months followed by ASA alone.  3. Pain Management: tylenol  prn 4. Mood/Behavior/Sleep: LCSW to follow for evaluation and support.   --does get up 3-4 times every night (food/tobacco)  -antipsychotic agents: N/a 5. Neuropsych/cognition: This patient is not capable of making decisions on her own behalf. 6. Skin/Wound Care: Routine pressure relief measures.  7. Fluids/Electrolytes/Nutrition: Monitor I/O. Family reports great appetite-->eats all day long 8.  T2DM: Hgb  A1c- 12.3. Monitor BS ac/hs and use SSI for elevated BS  --Was on 70/30 insulin  6 units bid and metformin  1000 mg bid   -recent CBG's poorly controlled -begin metformin  250mg  bid in AM  9. Dysphagia: On D1, thin liquids.  --Assistance with feeding due to impulsivity and for aspiration precautions.  10. H/o bipolar d/o: Used to have manic episodes/wander, etc but stable off medications.  11. Hyperlipidemia: Continue increased dose lipitor 12. Dementia: On Aricept  (followed by Dr. Georjean in the past)  -per daughters pt was generally independent within the home, needing  supervision with bathing and some ADL's for safety.  13. COPD/Tobacco use: Chews 4 packs of tobacco daily.   14. Blood pressure: Monitor TID--keep SBP > 110. --avoid hypotension given R-ICA occlusion.  15. Mild leucocytosis: WBC up to 10.4 but no signs of fever or infection. Recheck WBC in am. 16. Mild hyponatremia: Na-130 @ admission and has resolved.  17. Elevated LDL: On lipitor.      Kathryn GORMAN Schmitz, PA-C 07/16/2023

## 2023-07-16 NOTE — Progress Notes (Signed)
 STROKE TEAM PROGRESS NOTE    SIGNIFICANT HOSPITAL EVENTS 7/3: Patient admitted with acute left-sided weakness, left facial droop and right gaze preference, given TNK to treat stroke.  Patient also found to have right ICA occlusion, but family declined revascularization 7/5: Patient transferred out of the ICU  INTERIM HISTORY/SUBJECTIVE No family at the bedside.  Patient is awake alert in no apparent distress laying in the bed  CBC    Component Value Date/Time   WBC 10.4 07/16/2023 0505   RBC 4.76 07/16/2023 0505   HGB 12.0 07/16/2023 0505   HCT 38.7 07/16/2023 0505   PLT 208 07/16/2023 0505   MCV 81.3 07/16/2023 0505   MCH 25.2 (L) 07/16/2023 0505   MCHC 31.0 07/16/2023 0505   RDW 13.8 07/16/2023 0505   LYMPHSABS 2.5 07/11/2023 1458   MONOABS 0.8 07/11/2023 1458   EOSABS 0.0 07/11/2023 1458   BASOSABS 0.1 07/11/2023 1458    BMET    Component Value Date/Time   NA 137 07/16/2023 0505   K 4.3 07/16/2023 0505   CL 101 07/16/2023 0505   CO2 22 07/16/2023 0505   GLUCOSE 124 (H) 07/16/2023 0505   BUN 17 07/16/2023 0505   CREATININE 0.65 07/16/2023 0505   CREATININE 0.69 07/17/2022 1354   CALCIUM  9.3 07/16/2023 0505   EGFR 93 07/17/2022 1354   GFRNONAA >60 07/16/2023 0505   GFRNONAA 79 01/18/2020 0858    IMAGING past 24 hours No results found.   Vitals:   07/16/23 0324 07/16/23 0500 07/16/23 0723 07/16/23 1152  BP: 102/65  (!) 98/59 101/76  Pulse: 62  63 78  Resp:   16 16  Temp: 98.2 F (36.8 C)  97.8 F (36.6 C) 98 F (36.7 C)  TempSrc: Oral  Oral Oral  SpO2: 97%  99% 97%  Weight:  57.7 kg    Height:         PHYSICAL EXAM General:  Alert, well-nourished, well-developed elderly patient in no acute distress Psych:  Mood and affect appropriate for situation CV: Regular rate and rhythm on monitor Respiratory:  Regular, unlabored respirations on room air  NEURO:  Mental Status: Alert and oriented to person and place, gives the right month but the wrong  age Speech/Language: speech is with mild dysarthria,    Cranial Nerves:  II: PERRL.  Blinks to threat bilaterally III, IV, VI: Right gaze preference but able to cross midline V: Sensation is intact to light touch and symmetrical to face.  VII: Left facial droop VIII: hearing intact to voice. IX, X: Phonation is normal.  XII: tongue is midline without fasciculations. Motor: Able to move right upper and lower extremities with good antigravity strength, no movement of left upper extremity, withdraws left lower extremity to noxious Tone: is normal and bulk is normal Sensation- Intact to light touch bilaterally.  Coordination: FTN intact on the right, unable to perform on the left Gait- deferred  Most Recent NIH  1a Level of Conscious.: 0 1b LOC Questions: 1 1c LOC Commands: 0 2 Best Gaze: 1 3 Visual: 0 4 Facial Palsy: 0 5a Motor Arm - left: 4 5b Motor Arm - Right: 0 6a Motor Leg - Left: 4 6b Motor Leg - Right: 0 7 Limb Ataxia: 0 8 Sensory: 0 9 Best Language: 0 10 Dysarthria: 1 11 Extinct. and Inatten.: 0 TOTAL: 11   ASSESSMENT/PLAN  Ms. ONDREA DOW is a 73 y.o. female with history of diabetes, hypertension, hyperlipidemia and COPD admitted for acute onset left-sided weakness,  left facial droop and right gaze preference.  Patient was given TNK to treat her stroke, and was found to have a right internal carotid occlusion.  Family declined interventional radiology given her baseline functional status.  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 ASPECTS 10.    CTA head & neck occlusion of the right ICA from proximal cervical segment to cavernous segment with reconstitution of paraclinoid segment likely via circle of Willis, decreased caliber and number of right MCA branches, decreased caliber in number of right ACA branches, severe stenosis at the origin of  the left nondominant VA, multifocal stenosis and irregularity of the left V2 segment, likely chronic occlusion of left V4 segment after the origin of the left PICA, moderate stenosis of right PCA P2 segment, moderate stenosis of the origin of the left ICA MRI Scattered areas of acute infarct throughout the right MCA and right ACA territories. Additional punctate areas of acute infarct in the left basal ganglia and within the posterior aspect of the left ACA territory 2D Echo EF > 75% LDL 110 HgbA1c 12.3 UDS neg VTE prophylaxis - lovenox  No antithrombotic prior to admission, now on ASA 81 and plavix  DAPT for 3 months and then ASA alone Therapy recommendations:  CIR Disposition: Pending  Right ICA occlusion Occlusion of right ICA from proximal cervical segment cavernous segment with reconstitution at the paraclinoid segment noted on CT angiogram Likely acute on chronic Family declined interventions for this given baseline functional status On DAPT  Hypertension / hypotension Home meds: None Stable now  off phenylephrine  Keep systolic blood pressure greater than 110 Avoid low BP given R ICA occlusion Long term BP goal normotensive  Hyperlipidemia Home meds: Atorvastatin  20 mg daily, increased to 40 LDL 110, goal < 70 Continue statin at discharge  Diabetes type II poorly controlled Home meds: Insulin  70/30 6 units twice daily, metformin  1000 mg twice daily HgbA1c 12.3, goal < 7.0 CBGs SSI Recommend close follow-up with PCP for better DM control  Tobacco Abuse Patient chewing tobacco, large amount per daughters Tobacco cessation discussed with patient and family  Dysphagia Patient has post-stroke dysphagia SLP consulted Now on dysphagia 1 diet with thin liquids Eating well  DC tube feeding and NG tube 7/7  Other Stroke Risk Factors Advanced age  Other Active Problems Mild dementia - resume home aricept  COPD Mild leukocytosis WBC 10.9--9.3--10.4--10.3 Hyponatremia  sodium 135--131--133  Hospital day # 5  Patient seen by NP with MD, MD to edit note as needed.   Karna Geralds DNP, ACNPC-AG  [facility]    To contact Stroke Continuity provider, please refer to [email or web address]. After hours, contact General Neurology

## 2023-07-16 NOTE — Progress Notes (Addendum)
 Babs Arthea DASEN, MD  Physician Physical Medicine and Rehabilitation   PMR Pre-admission    Addendum   Date of Service: 07/15/2023  2:26 PM  Related encounter: ED to Hosp-Admission (Current) from 07/11/2023 in [place] [facility]   Expand All Collapse All  Show:Clear all [x] Written[x] Templated[x] Copied  Added by: [x] Alison Heron MATSU, RN[x] Conetta, Kristyn H[x] Babs Arthea DASEN, MD  [] Hover for details PMR Admission Coordinator Pre-Admission Assessment   Patient: Kathryn Mcguire is an 73 y.o., female MRN: [id number] DOB: 24-Dec-1950 Height: 5' 3 (160 cm) Weight: 55.7 kg                                                                                      Insurance Information HMO: yes    PPO:      PCP:      IPA:      80/20:      OTHER:  PRIMARY: [employer] Medicare           Policy#: [id number]  Gold plus, group # [id number]    Subscriber: Patient; Medicare [id number] CM Name: Leita Dus    Phone#: [phone]   Fax#: [phone] Pre-Cert#: [id number]  approved 7/8 until 7/14 and f/u with [facility] phone [phone] and fax [phone]    Employer:  Benefits:  Phone #: [phone]     Name: 7/7 Eff. Date: 01/09/2023-01/08/2024     Deduct: does not have      Out of Pocket Max: $6,750 ( $0 met)      CIR: $399/day co pay with max co pay of $2793/admission ( 7 days)      SNF: $10/day co pay for days 1-20 Outpatient: $25/visit co pay      Home Health: 100% coverage      DME: 80% coverage, 20% co pay        The "Data Collection Information Summary" for patients in Inpatient Rehabilitation Facilities with attached "Privacy Act Statement-Health Care Records" was provided and verbally reviewed with: Family   Emergency Contact Information Contact Information       Name Relation Home Work [place] Daughter     [phone]         Other Contacts       Name Relation Home Work Mobile    Berger,[name] Daughter      [phone]         Current Medical History  Patient Admitting Diagnosis: CVA   History of Present Illness: 73 y.o. female with history of bi[olar, dementia, DM, HTN, HLD, COPD arrives 07/11/23 to  [facility] ED presenting symptoms of left side weakness, decreased sensation, facial droop, gaze preference, and left neglect.    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 Family declined interventional radiology given her baseline functional status.    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 CTA head & neck occlusion of the right ICA from proximal cervical segment to cavernous segment with reconstitution of para clinoid segment likely via circle of Willis, decreased caliber and number of right MCA branches, decreased caliber in number of right ACA branches, severe stenosis at the origin of  the left nondominant VA, multifocal stenosis and irregularity of the left V2 segment, likely chronic occlusion of left V4 segment after the origin of the left PICA, moderate stenosis of right PCA P2 segment, moderate stenosis of the origin of the left ICA     No antithrombotics prior to admit. Now on ASA and Plavix  DAPT for 3 months and then ASA alone. Need to avoid low BP given R ICA occlusion. ON atorvastatin  prior to admit with LDL 110. Increased dosage. Hgb A1c 12.3 and on insulin  and metformin  at home. CBGS and SSI. Recommend close follow up with PCP for better DM control. Patient chews large amounts of tobacco. Discussed cessation with patient and family. Initially on cortrak and nocturnal TF. Have d/c'd TF for eating adequately now. Resumed home Aricept  for mild dementia.    Complete NIHSS TOTAL: 15 Glasgow Coma Scale Score: 14   Patient's medical record from [facility] has been reviewed by the rehabilitation admission coordinator and physician.   Past Medical History      Past Medical History:  Diagnosis Date   Arthritis     Bipolar depression (HCC)     COPD (chronic  obstructive pulmonary disease) (HCC)     Dementia (HCC)     Diabetes mellitus without complication (HCC)     Elevated LFTs     Emphysema of lung (HCC)     GERD (gastroesophageal reflux disease)     History of adenomatous polyps of colon 10/15/2013   Hyperlipemia     Hypertension     UTI (lower urinary tract infection)          Has the patient had major surgery during 100 days prior to admission? No   Family History  family history includes Bursitis in her daughter; Diabetes in her daughter and mother; Lung cancer in her sister; Lupus in her daughter; Migraines in her daughter.   Current Medications   Current Medications    Current Facility-Administered Medications:    acetaminophen  (TYLENOL ) tablet 650 mg, 650 mg, Oral, Q4H PRN, 650 mg at 07/15/23 0850 **OR** acetaminophen  (TYLENOL ) 160 MG/5ML solution 650 mg, 650 mg, Per Tube, Q4H PRN **OR** acetaminophen  (TYLENOL ) suppository 650 mg, 650 mg, Rectal, Q4H PRN, Waddell Aquas A, NP   aspirin  chewable tablet 81 mg, 81 mg, Per Tube, Daily, Cyndy Ozell DASEN, RPH, 81 mg at 07/15/23 0850   atorvastatin  (LIPITOR) tablet 40 mg, 40 mg, Per Tube, Daily, Bitonti, Michael T, RPH, 40 mg at 07/15/23 9149   Chlorhexidine  Gluconate Cloth 2 % PADS 6 each, 6 each, Topical, Daily, Michaela Aisha SQUIBB, MD, 6 each at 07/13/23 1136   clopidogrel  (PLAVIX ) tablet 75 mg, 75 mg, Per Tube, Daily, Bitonti, Michael T, RPH, 75 mg at 07/15/23 9149   donepezil  (ARICEPT ) tablet 5 mg, 5 mg, Per Tube, QHS, Jerri Pfeiffer, MD, 5 mg at 07/14/23 2222   enoxaparin  (LOVENOX ) injection 40 mg, 40 mg, Subcutaneous, Q24H, Jerri Pfeiffer, MD, 40 mg at 07/14/23 2222   feeding supplement (ENSURE PLUS HIGH PROTEIN) liquid 237 mL, 237 mL, Oral, BID BM, Jerri Pfeiffer, MD   insulin  aspart (novoLOG ) injection 0-15 Units, 0-15 Units, Subcutaneous, Q4H, Michaela Aisha SQUIBB, MD, 8 Units at 07/15/23 1223   labetalol  (NORMODYNE ) injection 10-20 mg, 10-20 mg, Intravenous, Q2H PRN, Waddell Aquas A, NP, 20 mg at 07/11/23 1505   pantoprazole  (PROTONIX ) injection 40 mg, 40 mg, Intravenous, QHS, Waddell Aquas A, NP, 40 mg at 07/14/23 2222   phenylephrine  (NEO-SYNEPHRINE) 20mg /NS 250mL premix infusion, 0-400  mcg/min, Intravenous, Titrated, Michaela Aisha SQUIBB, MD, Stopped at 07/12/23 1214   senna-docusate (Senokot-S) tablet 1 tablet, 1 tablet, Oral, QHS PRN, Waddell Karna LABOR, NP     Patients Current Diet:  Diet Order                  DIET - DYS 1 Room service appropriate? No; Fluid consistency: Thin  Diet effective now                       Precautions / Restrictions Precautions Precautions: Fall, Other (comment) Precaution/Restrictions Comments: LUE hemi Restrictions Weight Bearing Restrictions Per Provider Order: No    Has the patient had 2 or more falls or a fall with injury in the past year?No   Prior Activity Level   Prior Functional Level Prior Function Prior Level of Function : Needs assist Mobility Comments: ambulated no device ADLs Comments: cues to complete ADLs 2/2 dementia   Self Care: Did the patient need help bathing, dressing, using the toilet or eating?  Independent   Indoor Mobility: Did the patient need assistance with walking from room to room (with or without device)? Independent   Stairs: Did the patient need assistance with internal or external stairs (with or without device)? Independent   Functional Cognition: Did the patient need help planning regular tasks such as shopping or remembering to take medications? Independent   Patient Information Are you of Hispanic, Latino/a,or Spanish origin?: A. No, not of Hispanic, Latino/a, or Spanish origin What is your race?: B. Black or African American Do you need or want an interpreter to communicate with a doctor or health care staff?: 0. No   Patient's Response To:  Health Literacy and Transportation Is the patient able to respond to health literacy and transportation needs?: No Health  Literacy - How often do you need to have someone help you when you read instructions, pamphlets, or other written material from your doctor or pharmacy?: Patient unable to respond   Home Assistive Devices / Equipment Home Equipment: Agricultural consultant (2 wheels), Grab bars - tub/shower   Prior Device Use: Indicate devices/aids used by the patient prior to current illness, exacerbation or injury? None of the above   Current Functional Level Cognition   Arousal/Alertness: Awake/alert Overall Cognitive Status: No family/caregiver present to determine baseline cognitive functioning Orientation Level: Oriented to person Attention: Sustained Sustained Attention: Impaired Sustained Attention Impairment: Verbal basic Memory: Impaired Memory Impairment: Decreased recall of new information Awareness: Impaired Awareness Impairment: Other (comment) (online) Comments: L inattention    Extremity Assessment (includes Sensation/Coordination)   Upper Extremity Assessment: LUE deficits/detail LUE Deficits / Details: flaccid, no AROM. able to located it when prompted. LUE Sensation: decreased light touch LUE Coordination: decreased fine motor, decreased gross motor  Lower Extremity Assessment: Defer to PT evaluation LLE Deficits / Details: knee extension 2+ in sitting; ankle DF 2+; required assist to advance LLE however did not buckle in wt-bearing LLE Sensation: WNL     ADLs   Overall ADL's : Needs assistance/impaired Eating/Feeding: Set up, Sitting Eating/Feeding Details (indicate cue type and reason): with RUE Grooming: Moderate assistance, Sitting Grooming Details (indicate cue type and reason): apply lotion to LUE, needs some assist to squeeze bottle. Upper Body Bathing: Moderate assistance, Sitting Lower Body Bathing: Maximal assistance, Sit to/from stand, Sitting/lateral leans Upper Body Dressing : Maximal assistance, Sitting, Cueing for sequencing Upper Body Dressing Details (indicate cue  type and reason): doff/don gowns, cues to sequence first in and  last out. Lower Body Dressing: Maximal assistance, Bed level (donning socks) Toilet Transfer: Moderate assistance, +2 for physical assistance, +2 for safety/equipment, Stand-pivot Functional mobility during ADLs: Moderate assistance, +2 for physical assistance, +2 for safety/equipment (pivot reclienr\)     Mobility   Overal bed mobility: Needs Assistance Bed Mobility: Rolling, Sidelying to Sit Rolling: Min assist, Used rails Sidelying to sit: Mod assist, HOB elevated, Used rails General bed mobility comments: mod A to raise trunk and advance BLE to EOB, cues needed to sequence properly.     Transfers   Overall transfer level: Needs assistance Equipment used: 2 person hand held assist Transfers: Sit to/from Stand, Bed to chair/wheelchair/BSC Sit to Stand: Mod assist, +2 physical assistance Bed to/from chair/wheelchair/BSC transfer type:: Step pivot Step pivot transfers: Mod assist, +2 physical assistance, +2 safety/equipment General transfer comment: needs heavy mod A+2 to transfer and weight shift to offload and andvance RLE. LLE hyperextended in standing and unable to advance without assist     Ambulation / Gait / Stairs / Wheelchair Mobility   Ambulation/Gait Pre-gait activities: pivotal steps only     Posture / Balance Dynamic Sitting Balance Sitting balance - Comments: CGA sitting EOB Balance Overall balance assessment: Needs assistance Sitting-balance support: No upper extremity supported, Feet supported Sitting balance-Leahy Scale: Fair Sitting balance - Comments: CGA sitting EOB Postural control: Left lateral lean Standing balance support: Bilateral upper extremity supported, During functional activity Standing balance-Leahy Scale: Poor Standing balance comment: reliant on ext support.     Special needs/care consideration Hgb A1c 12.3        Previous Home Environment  Living Arrangements: Children  Lives  With: Family Available Help at Discharge: Family, Available 24 hours/day Type of Home: House Home Layout: Two level Alternate Level Stairs-Rails: None Alternate Level Stairs-Number of Steps: 2 (between back entrance and kitchen) Home Access: Stairs to enter Entrance Stairs-Rails: None Entrance Stairs-Number of Steps: 2 Bathroom Shower/Tub: Health visitor: Standard Additional Comments: information provided by daughter   Discharge Living Setting Plans for Discharge Living Setting: Patient's home, Lives with (comment) (family) Type of Home at Discharge: House Discharge Home Layout: Two level Discharge Home Access: Stairs to enter Entrance Stairs-Rails: None Entrance Stairs-Number of Steps: 2 Discharge Bathroom Shower/Tub: Walk-in shower Discharge Bathroom Toilet: Standard Does the patient have any problems obtaining your medications?: No   Social/Family/Support Systems Anticipated Caregiver: Edsel, daughter Anticipated Industrial/product designer Information: [phone] Caregiver Availability: 24/7 Discharge Plan Discussed with Primary Caregiver: Yes Is Caregiver In Agreement with Plan?: Yes Does Caregiver/Family have Issues with Lodging/Transportation while Pt is in Rehab?: No   Goals Patient/Family Goal for Rehab: min assist to sup PT, OT, supervision/min SLP Expected length of stay: 17-23 days Pt/Family Agrees to Admission and willing to participate: Yes Program Orientation Provided & Reviewed with Pt/Caregiver Including Roles  & Responsibilities: Yes  Barriers to Discharge: Insurance for SNF coverage   Decrease burden of Care through IP rehab admission: n/a   Possible need for SNF placement upon discharge:not anticipated   Patient Condition: This patient's medical and functional status has not changed since the consult dated 07/15/23 in which the Rehabilitation Physician determined and documented that the patient was potentially appropriate for intensive  rehabilitative care in an inpatient rehabilitation facility. Issues have been addressed and update has been discussed with Dr. Babs and patient now appropriate for inpatient rehabilitation. Will admit to inpatient rehab today.    Preadmission Screen Completed By:  Heron Leavell RN MSN, 07/15/2023 2:26 PM ______________________________________________________________________  Discussed status with Dr. Babs on 07/16/23 at 1317 and received approval for admission today.   Admission Coordinator:  Heron Leavell RN MSN, time 8682 Date 07/16/23        Revision History

## 2023-07-16 NOTE — Progress Notes (Addendum)
 Inpatient Rehabilitation Admissions Coordinator   I await insurance approval for possible Cir admit. I met at bedside with daughter, Edsel and reviewed estimated cost of care.   Heron Leavell, RN, MSN Rehab Admissions Coordinator [phone] 07/16/2023 11:50 AM   I have insurance approval and can admit to CIR today. I met with daughter at bedside and alerted acute team and TOC. I will make the arrangements.  Heron Leavell, RN, MSN Rehab Admissions Coordinator [phone] 07/16/2023 12:58 PM

## 2023-07-17 DIAGNOSIS — I63511 Cerebral infarction due to unspecified occlusion or stenosis of right middle cerebral artery: Secondary | ICD-10-CM | POA: Diagnosis not present

## 2023-07-17 LAB — COMPREHENSIVE METABOLIC PANEL WITH GFR
ALT: 15 U/L (ref 0–44)
AST: 21 U/L (ref 15–41)
Albumin: 2.9 g/dL — ABNORMAL LOW (ref 3.5–5.0)
Alkaline Phosphatase: 61 U/L (ref 38–126)
Anion gap: 10 (ref 5–15)
BUN: 19 mg/dL (ref 8–23)
CO2: 23 mmol/L (ref 22–32)
Calcium: 9.3 mg/dL (ref 8.9–10.3)
Chloride: 101 mmol/L (ref 98–111)
Creatinine, Ser: 0.65 mg/dL (ref 0.44–1.00)
GFR, Estimated: >60 mL/min (ref >60)
Glucose, Bld: 220 mg/dL — ABNORMAL HIGH (ref 70–99)
Potassium: 4.6 mmol/L (ref 3.5–5.1)
Sodium: 134 mmol/L — ABNORMAL LOW (ref 135–145)
Total Bilirubin: 0.6 mg/dL (ref 0.0–1.2)
Total Protein: 6.2 g/dL — ABNORMAL LOW (ref 6.5–8.1)

## 2023-07-17 LAB — CBC WITH DIFFERENTIAL/PLATELET
Abs Immature Granulocytes: 0.05 K/uL (ref 0.00–0.07)
Basophils Absolute: 0.1 K/uL (ref 0.0–0.1)
Basophils Relative: 1 %
Eosinophils Absolute: 0.4 K/uL (ref 0.0–0.5)
Eosinophils Relative: 4 %
HCT: 38.2 % (ref 36.0–46.0)
Hemoglobin: 12 g/dL (ref 12.0–15.0)
Immature Granulocytes: 1 %
Lymphocytes Relative: 34 %
Lymphs Abs: 3.2 K/uL (ref 0.7–4.0)
MCH: 25.5 pg — ABNORMAL LOW (ref 26.0–34.0)
MCHC: 31.4 g/dL (ref 30.0–36.0)
MCV: 81.3 fL (ref 80.0–100.0)
Monocytes Absolute: 0.9 K/uL (ref 0.1–1.0)
Monocytes Relative: 9 %
Neutro Abs: 5.1 K/uL (ref 1.7–7.7)
Neutrophils Relative %: 51 %
Platelets: 237 K/uL (ref 150–400)
RBC: 4.7 MIL/uL (ref 3.87–5.11)
RDW: 13.4 % (ref 11.5–15.5)
WBC: 9.7 K/uL (ref 4.0–10.5)
nRBC: 0 % (ref 0.0–0.2)

## 2023-07-17 LAB — GLUCOSE, CAPILLARY
Glucose-Capillary: 201 mg/dL — ABNORMAL HIGH (ref 70–99)
Glucose-Capillary: 270 mg/dL — ABNORMAL HIGH (ref 70–99)
Glucose-Capillary: 170 mg/dL — ABNORMAL HIGH (ref 70–99)
Glucose-Capillary: 192 mg/dL — ABNORMAL HIGH (ref 70–99)

## 2023-07-17 MED ORDER — SODIUM CHLORIDE 0.45 % IV BOLUS
1000.0000 mL | Freq: Once | INTRAVENOUS | Status: AC
  Administered 2023-07-17: 1000 mL via INTRAVENOUS

## 2023-07-17 MED ORDER — INSULIN GLARGINE-YFGN 100 UNIT/ML ~~LOC~~ SOPN
10.0000 [IU] | PEN_INJECTOR | Freq: Every day | SUBCUTANEOUS | Status: DC
  Filled 2023-07-17: qty 3

## 2023-07-17 MED ORDER — INSULIN GLARGINE-YFGN 100 UNIT/ML ~~LOC~~ SOLN
10.0000 [IU] | Freq: Every day | SUBCUTANEOUS | Status: DC
  Administered 2023-07-17: 10 [IU] via SUBCUTANEOUS
  Filled 2023-07-17 (×2): qty 0.1

## 2023-07-17 NOTE — Patient Care Conference (Signed)
 Inpatient RehabilitationTeam Conference and Plan of Care Update Date: 07/17/2023   Time: 10:09 AM    Patient Name: Kathryn Mcguire      Medical Record Number: [id number]  Date of Birth: 08/08/1950 Sex: Female         Room/Bed: 4M08C/4M08C-01 Payor Info: Payor: [employer] MEDICARE / Plan: [employer] MEDICARE HMO / Product Type: *No Product type* /    Admit Date/Time:  07/16/2023  5:46 PM  Primary Diagnosis:  Acute ischemic right MCA stroke [facility])  Hospital Problems: Principal Problem:   Acute ischemic right MCA stroke [facility])    Expected Discharge Date: Expected Discharge Date:  (evals pending)  Team Members Present: Social Worker Present: Graeme Jude, LCSW Nurse Present: Barnie Ronde, RN PT Present: Sherlean Perks, PT;Dominic Dresbach, PTA OT Present: Recardo Maxwell, OT SLP Present: Recardo Mole, SLP PPS Coordinator present : Eleanor Colon, SLP     Current Status/Progress Goal Weekly Team Focus  Bowel/Bladder      Incontinent of bowel and bladder    Incontinence managed    Toileting protocol   Swallow/Nutrition/ Hydration   Dys 1/thin           ADL's   evaluation pending            Mobility   PT EVAL PENDING           Communication   eval pending            Safety/Cognition/ Behavioral Observations  eval pending            Pain      N/a          Skin      N/a           Discharge Planning:  TBA   Team Discussion: Patient post right MCA CVA with history of Bipolar disorder and dementia on Aricept  with sensory deficits and apraxia and left neglect and left UE flaccidity.    Patient on target to meet rehab goals: Currently needs min assist for bed mobility and min assist for  *See Care Plan and progress notes for long and short-term goals.   Revisions to Treatment Plan:  N/a   Teaching Needs: Safety, medications, transfers, toileting, dietary modifications, etc.   Current Barriers to Discharge: Decreased caregiver support and  Home enviroment access/layout  Possible Resolutions to Barriers: Family education     Medical Summary Current Status: just admitted, Left neglect weakness on left side, poor po fluid intake  Barriers to Discharge: Incontinence;Inadequate Nutritional Intake   Possible Resolutions to [employer] Focus: IVF at night   Continued Need for Acute Rehabilitation Level of Care: The patient requires daily medical management by a physician with specialized training in physical medicine and rehabilitation for the following reasons: Direction of a multidisciplinary physical rehabilitation program to maximize functional independence : Yes Medical management of patient stability for increased activity during participation in an intensive rehabilitation regime.: Yes Analysis of laboratory values and/or radiology reports with any subsequent need for medication adjustment and/or medical intervention. : Yes   I attest that I was present, lead the team conference, and concur with the assessment and plan of the team.   Ronde Barnie B 07/17/2023, 4:19 PM

## 2023-07-17 NOTE — Progress Notes (Signed)
 Inpatient Rehabilitation Admission Medication Review by a Pharmacist  A complete drug regimen review was completed for this patient to identify any potential clinically significant medication issues.  High Risk Drug Classes Is patient taking? Indication by Medication  Antipsychotic Yes Compazine  - N/V  Anticoagulant Yes Lovenox  - DVT px  Antibiotic No   Opioid No   Antiplatelet Yes Asa/plavix  x3 mo then ASA alone - CVA   Hypoglycemics/insulin  Yes SSI/Semglee /metformin  - DM  Vasoactive Medication No   Chemotherapy No   Other Yes Lipitor - HLD Aricept  - dementia Melatonin - sleep Protonix  - GERD     Type of Medication Issue Identified Description of Issue Recommendation(s)  Drug Interaction(s) (clinically significant)     Duplicate Therapy     Allergy     No Medication Administration End Date     Incorrect Dose     Additional Drug Therapy Needed     Significant med changes from prior encounter (inform family/care partners about these prior to discharge).    Other       Clinically significant medication issues were identified that warrant physician communication and completion of prescribed/recommended actions by midnight of the next day:  No  Name of provider notified for urgent issues identified:   Provider Method of Notification:     Pharmacist comments:   Time spent performing this drug regimen review (minutes):  20   Sergio Batch, PharmD, [place], AAHIVP, CPP Infectious Disease Pharmacist 07/17/2023 7:53 AM

## 2023-07-17 NOTE — Progress Notes (Signed)
 Inpatient Rehabilitation  Patient information reviewed and entered into eRehab system by [facility]. Karen Kays., CCC/SLP, PPS Coordinator.  Information including medical coding, functional ability and quality indicators will be reviewed and updated through discharge.

## 2023-07-17 NOTE — Plan of Care (Signed)
  Problem: RH Balance Goal: LTG Patient will maintain dynamic sitting balance (PT) Description: LTG:  Patient will maintain dynamic sitting balance with assistance during mobility activities (PT) Flowsheets (Taken 07/17/2023 1514) LTG: Pt will maintain dynamic sitting balance during mobility activities with:: Contact Guard/Touching assist Goal: LTG Patient will maintain dynamic standing balance (PT) Description: LTG:  Patient will maintain dynamic standing balance with assistance during mobility activities (PT) Flowsheets (Taken 07/17/2023 1514) LTG: Pt will maintain dynamic standing balance during mobility activities with:: Moderate Assistance - Patient 50 - 74%   Problem: Sit to Stand Goal: LTG:  Patient will perform sit to stand with assistance level (PT) Description: LTG:  Patient will perform sit to stand with assistance level (PT) Flowsheets (Taken 07/17/2023 1514) LTG: PT will perform sit to stand in preparation for functional mobility with assistance level: Contact Guard/Touching assist   Problem: RH Bed Mobility Goal: LTG Patient will perform bed mobility with assist (PT) Description: LTG: Patient will perform bed mobility with assistance, with/without cues (PT). Flowsheets (Taken 07/17/2023 1514) LTG: Pt will perform bed mobility with assistance level of: Minimal Assistance - Patient > 75%   Problem: RH Car Transfers Goal: LTG Patient will perform car transfers with assist (PT) Description: LTG: Patient will perform car transfers with assistance (PT). Flowsheets (Taken 07/17/2023 1514) LTG: Pt will perform car transfers with assist:: Minimal Assistance - Patient > 75%   Problem: RH Ambulation Goal: LTG Patient will ambulate in controlled environment (PT) Description: LTG: Patient will ambulate in a controlled environment, # of feet with assistance (PT). 07/17/2023 1548 by Gerianne Hardy, Student-PT Flowsheets (Taken 07/17/2023 1548) LTG: Ambulation distance in controlled environment:  150 feet 07/17/2023 1514 by Gerianne, Takera Rayl, Student-PT Flowsheets (Taken 07/17/2023 1514) LTG: Pt will ambulate in controlled environ  assist needed:: Minimal Assistance - Patient > 75% Goal: LTG Patient will ambulate in home environment (PT) Description: LTG: Patient will ambulate in home environment, # of feet with assistance (PT). 07/17/2023 1548 by Gerianne, Shyleigh Daughtry, Student-PT Flowsheets (Taken 07/17/2023 1548) LTG: Ambulation distance in home environment: 100 feet 07/17/2023 1514 by Gerianne, Tyrik Stetzer, Student-PT Flowsheets (Taken 07/17/2023 1514) LTG: Pt will ambulate in home environ  assist needed:: Minimal Assistance - Patient > 75% Goal: LTG Patient will ambulate in community environment (PT) Description: LTG: Patient will ambulate in community environment, # of feet with assistance (PT). 07/17/2023 1548 by Gerianne, Sandi Towe, Student-PT Flowsheets (Taken 07/17/2023 1548) LTG: Ambulation distance in community environment: 50 feet 07/17/2023 1514 by Gerianne Hardy, Student-PT Flowsheets (Taken 07/17/2023 1514) LTG: Pt will ambulate in community environ  assist needed:: Minimal Assistance - Patient > 75%   Problem: RH Stairs Goal: LTG Patient will ambulate up and down stairs w/assist (PT) Description: LTG: Patient will ambulate up and down # of stairs with assistance (PT) Flowsheets (Taken 07/17/2023 1514) LTG: Pt will ambulate up/down stairs assist needed:: Minimal Assistance - Patient > 75% LTG: Pt will  ambulate up and down number of stairs: 4

## 2023-07-17 NOTE — Plan of Care (Signed)
  Problem: RH Swallowing Goal: LTG Patient will consume least restrictive diet using compensatory strategies with assistance (SLP) Description: LTG:  Patient will consume least restrictive diet using compensatory strategies with assistance (SLP) Flowsheets (Taken 07/17/2023 1045) LTG: Pt Patient will consume least restrictive diet using compensatory strategies with assistance of (SLP): Minimal Assistance - Patient > 75%   Problem: RH Cognition - SLP Goal: RH LTG Patient will demonstrate orientation with cues Description:  LTG:  Patient will demonstrate orientation to person/place/time/situation with cues (SLP)   Flowsheets (Taken 07/17/2023 1045) LTG Patient will demonstrate orientation to:  Person  Place  Time  Situation LTG: Patient will demonstrate orientation using cueing (SLP): Minimal Assistance - Patient > 75%   Problem: RH Problem Solving Goal: LTG Patient will demonstrate problem solving for (SLP) Description: LTG:  Patient will demonstrate problem solving for basic/complex daily situations with cues  (SLP) Flowsheets (Taken 07/17/2023 1045) LTG: Patient will demonstrate problem solving for (SLP): Basic daily situations LTG Patient will demonstrate problem solving for: Moderate Assistance - Patient 50 - 74%   Problem: RH Memory Goal: LTG Patient will use memory compensatory aids to (SLP) Description: LTG:  Patient will use memory compensatory aids to recall biographical/new, daily complex information with cues (SLP) Flowsheets (Taken 07/17/2023 1045) LTG: Patient will use memory compensatory aids to (SLP): Moderate Assistance - Patient 50 - 74%   Problem: RH Attention Goal: LTG Patient will demonstrate this level of attention during functional activites (SLP) Description: LTG:  Patient will will demonstrate this level of attention during functional activites (SLP) Flowsheets (Taken 07/17/2023 1045) Patient will demonstrate during cognitive/linguistic activities the attention type of:  Sustained Patient will demonstrate this level of attention during cognitive/linguistic activities in: Controlled LTG: Patient will demonstrate this level of attention during cognitive/linguistic activities with assistance of (SLP): Moderate Assistance - Patient 50 - 74% Number of minutes patient will demonstrate attention during cognitive/linguistic activities: 15   Problem: RH Awareness Goal: LTG: Patient will demonstrate awareness during functional activites type of (SLP) Description: LTG: Patient will demonstrate awareness during functional activites type of (SLP) Flowsheets (Taken 07/17/2023 1045) Patient will demonstrate during cognitive/linguistic activities awareness type of: Intellectual LTG: Patient will demonstrate awareness during cognitive/linguistic activities with assistance of (SLP): Moderate Assistance - Patient 50 - 74%

## 2023-07-17 NOTE — Evaluation (Signed)
 Occupational Therapy Assessment and Plan  Patient Details  Name: Kathryn Mcguire MRN: [id number] Date of Birth: Jul 15, 1950  OT Diagnosis: abnormal posture, altered mental status, apraxia, cognitive deficits, disturbance of vision, flaccid hemiplegia and hemiparesis, and muscle weakness (generalized) Rehab Potential: Rehab Potential (ACUTE ONLY): Fair ELOS: 12-14 days   Today's Date: 07/17/2023 OT Individual Time: 9166-9053 OT Individual Time Calculation (min): 73 min     Hospital Problem: Principal Problem:   Acute ischemic right MCA stroke (HCC)   Past Medical History:  Past Medical History:  Diagnosis Date   Arthritis    Bipolar depression (HCC)    COPD (chronic obstructive pulmonary disease) (HCC)    Dementia (HCC)    Diabetes mellitus without complication (HCC)    Elevated LFTs    Emphysema of lung (HCC)    GERD (gastroesophageal reflux disease)    History of adenomatous polyps of colon 10/15/2013   Hyperlipemia    Hypertension    UTI (lower urinary tract infection)    Past Surgical History:  Past Surgical History:  Procedure Laterality Date   COLONOSCOPY     ESOPHAGOGASTRODUODENOSCOPY     TONSILLECTOMY     TONSILLECTOMY AND ADENOIDECTOMY     TUBAL LIGATION      Assessment & Plan Clinical Impression:  Kathryn Mcguire is a 73 year old female with history of T2DM, HTN, COPD, biploar d/o, dementia who was admitted on 07/11/23 with acute onset of left sided weakness with facial droop and right gaze preference as well as witnessed fall by EMS. CTA head/neck showed occlusion of R-ICA from proximal cervical segment to cavernous segment, decrease in caliber of R-MCA branches and R-ACA c/w left suspected to be due to R-ICA occlusion. She received TNK. Family declined revascularization. 2 D echo showed EF > 75% with denegerative MV and negative bubble study.  MRI brain done revealing scattered areas of acute infarct in left basal ganglia and within posterior aspect of L-ACA  territory, mild edema and sulcal effacement in right frontoparietal lobes. Stroke felt to be due to large vessel disease and Dr. Jerri recommended DAPT X 3 months followed by ASA alone.    She was made NPO and NGT placed for nutritional support.  MBS performed on 07/06 to evaluate swallow and showed decreased sensation to trigger swallow with moderate oral residue and episode of sensed aspiration of thin liquids which was eliminated by straw. D1 nectar trialed and advanced to D1 thins but noted to be impulsive. She continues to have bouts of lethargy with stimulation to maintain attention. She continues to be limited by left sided weakness, right gaze preference w/left inattention, decline in baseline cognitive function, left lateral lean,  requires mod to max assist for ADLs and mod assist +2 for SPT.  Patient transferred to CIR on 07/16/2023 .    Patient currently requires max with basic self-care skills secondary to abnormal tone, motor apraxia, decreased coordination, and decreased motor planning, decreased visual perceptual skills, decreased visual motor skills, and field cut, decreased attention to left, left side neglect, and decreased motor planning, decreased initiation, decreased attention, decreased awareness, decreased problem solving, decreased safety awareness, decreased memory, and delayed processing, and decreased sitting balance, decreased standing balance, decreased postural control, hemiplegia, and decreased balance strategies.  Prior to hospitalization, patient could complete BADL with min.  Patient will benefit from skilled intervention to decrease level of assist with basic self-care skills and increase independence with basic self-care skills prior to discharge home with care partner.  Anticipate  patient will require minimal physical assistance and follow up home health and follow up outpatient.  OT - End of Session Activity Tolerance: Decreased this session Endurance Deficit: Yes OT  Assessment Rehab Potential (ACUTE ONLY): Fair OT Patient demonstrates impairments in the following area(s): Balance;Behavior;Cognition;Endurance;Motor;Perception;Safety;Sensory;Vision OT Basic ADL's Functional Problem(s): Eating;Grooming;Bathing;Dressing;Toileting OT Transfers Functional Problem(s): Toilet;Tub/Shower OT Plan OT Intensity: Minimum of 1-2 x/day, 45 to 90 minutes OT Frequency: 5 out of 7 days OT Duration/Estimated Length of Stay: 12-14 days OT Treatment/Interventions: Balance/vestibular training;Discharge planning;Functional electrical stimulation;Self Care/advanced ADL retraining;Therapeutic Activities;Cognitive remediation/compensation;Disease mangement/prevention;Patient/family education;Functional mobility training;Skin care/wound managment;Therapeutic Exercise;Visual/perceptual remediation/compensation;DME/adaptive equipment instruction;Neuromuscular re-education;Splinting/orthotics;UE/LE Strength taining/ROM;Wheelchair propulsion/positioning OT Self Feeding Anticipated Outcome(s): Cabin crew Self-Care Anticipated Outcome(s): Min assist OT Toileting Anticipated Outcome(s): Min assist OT Bathroom Transfers Anticipated Outcome(s): Min assist OT Recommendation Patient destination: Home Follow Up Recommendations: Home health OT;Outpatient OT Equipment Recommended: To be determined   OT Evaluation Precautions/Restrictions  Precautions Precautions: Fall Recall of Precautions/Restrictions: Impaired Precaution/Restrictions Comments: L hemiplegia, h/o dementia Restrictions Weight Bearing Restrictions Per Provider Order: No Therapy Vitals Temp: 97.8 F (36.6 C) Temp Source: Oral Pulse Rate: 70 Resp: 18 BP: 125/73 Patient Position (if appropriate): Sitting Oxygen Therapy SpO2: 100 % O2 Device: Room Air Pain Pain Assessment Pain Score: 0-No pain Home Living/Prior Functioning Home Living Family/patient expects to be discharged to:: Private residence Living  Arrangements: Children Available Help at Discharge: Family, Available 24 hours/day Type of Home: House Home Access: Stairs to enter Secretary/administrator of Steps: 2 Entrance Stairs-Rails: None Home Layout: Two level Alternate Level Stairs-Number of Steps: 2 Alternate Level Stairs-Rails: None Bathroom Shower/Tub: Health visitor: Standard Additional Comments: information provided by daughter  Lives With: Family IADL History Education: 1 year of college Prior Function Level of Independence: Needs assistance with ADLs, Requires assistive device for independence Bath: Minimal Dressing: Minimal  Able to Take Stairs?: Yes (need to clarify with family) Driving: No Vocation: Retired Leisure: Hobbies-yes (Comment) Vision Baseline Vision/History: 0 No visual deficits Ability to See in Adequate Light: 0 Adequate Patient Visual Report: Other (comment) (difficult to assess due to L neglect) Vision Assessment?: Vision impaired- to be further tested in functional context Additional Comments: Was not able to track to the left possibly due to L neglect Perception  Perception: Impaired Praxis Praxis: Impaired Praxis Impairment Details: Initiation;Ideomotor;Perseveration;Motor planning;Organization;Limb apraxia Cognition Cognition Overall Cognitive Status: Impaired/Different from baseline Arousal/Alertness: Awake/alert Orientation Level: Person;Place;Situation Person: Oriented Place: Oriented Situation: Oriented Memory: Impaired Memory Impairment: Retrieval deficit;Decreased recall of new information;Decreased long term memory;Decreased short term memory;Storage deficit Decreased Long Term Memory: Verbal basic;Functional basic Decreased Short Term Memory: Verbal basic;Functional basic Attention: Selective;Divided Focused Attention: Impaired Focused Attention Impairment: Verbal basic Sustained Attention: Impaired Sustained Attention Impairment: Verbal basic;Functional  basic Selective Attention: Impaired Selective Attention Impairment: Verbal basic Divided Attention: Impaired Divided Attention Impairment: Verbal basic Awareness: Impaired Awareness Impairment: Intellectual impairment Problem Solving: Impaired Problem Solving Impairment: Verbal basic;Functional basic Executive Function: Self Monitoring;Self Correcting;Organizing Organizing: Impaired Organizing Impairment: Verbal basic;Functional basic Self Monitoring: Impaired Self Monitoring Impairment: Verbal basic;Functional basic Self Correcting: Impaired Self Correcting Impairment: Verbal basic;Functional basic Behaviors: Restless;Impulsive Safety/Judgment: Impaired Comments: L inattention/ left neglect Brief Interview for Mental Status (BIMS) Repetition of Three Words (First Attempt): 3 Temporal Orientation: Year: Correct Temporal Orientation: Month: Accurate within 5 days Temporal Orientation: Day: Incorrect Recall: Sock: No, could not recall Recall: Blue: No, could not recall Recall: Bed: No, could not recall BIMS Summary Score: 8 Sensation Sensation Light Touch: Impaired by gross assessment Hot/Cold: Impaired  by gross assessment Proprioception: Impaired by gross assessment Stereognosis: Not tested Additional Comments: Absent sensation LUE Coordination Gross Motor Movements are Fluid and Coordinated: No Fine Motor Movements are Fluid and Coordinated: No Finger Nose Finger Test: Modified testing - unilateral with RUE - Appears WFL - difficult following instructions Motor  Motor Motor: Hemiplegia;Motor apraxia;Abnormal tone;Clonus Motor - Skilled Clinical Observations: pt. has excesssive BL hamstring tone and clonus with quick knee flexion  Trunk/Postural Assessment  Cervical Assessment Cervical Assessment: Exceptions to [facility] (has range available, but maintains head right position) Thoracic Assessment Thoracic Assessment: Within Functional Limits Lumbar Assessment Lumbar  Assessment: Within Functional Limits Postural Control Postural Control: Deficits on evaluation Righting Reactions: While in stance pt has anterior L lean  Balance Balance Balance Assessed: Yes Static Sitting Balance Static Sitting - Balance Support: Right upper extremity supported;Feet supported;Left upper extremity supported Static Sitting - Level of Assistance: 4: Min assist Dynamic Sitting Balance Dynamic Sitting - Balance Support: Right upper extremity supported;Feet supported;During functional activity;Left upper extremity supported Dynamic Sitting - Level of Assistance: 3: Mod assist Sitting balance - Comments: CGA sitting EOB, with hand reaches across body Static Standing Balance Static Standing - Balance Support: Right upper extremity supported;During functional activity Static Standing - Level of Assistance: 4: Min assist Dynamic Standing Balance Dynamic Standing - Balance Support: Right upper extremity supported;Left upper extremity supported;During functional activity Dynamic Standing - Level of Assistance: 3: Mod assist Extremity/Trunk Assessment RUE Assessment RUE Assessment: Within Functional Limits LUE Assessment LUE Assessment: Exceptions to [facility] Active Range of Motion (AROM) Comments: trace adduction of shoulder with facilitation LUE Body System: Neuro Brunstrum levels for arm and hand: Arm;Hand Brunstrum level for arm: Stage I Presynergy Brunstrum level for hand: Stage I Flaccidity  Care Tool Care Tool Self Care Eating   Eating Assist Level: Minimal Assistance - Patient > 75%    Oral Care  Oral care, brush teeth, clean dentures activity did not occur: Refused      Bathing   Body parts bathed by patient: Chest;Face;Right upper leg;Left upper leg;Abdomen;Front perineal area;Buttocks Body parts bathed by helper: Right arm;Left arm;Left lower leg;Right lower leg   Assist Level: Moderate Assistance - Patient 50 - 74%    Upper Body Dressing(including orthotics)    What is the patient wearing?: Pull over shirt   Assist Level: Moderate Assistance - Patient 50 - 74%    Lower Body Dressing (excluding footwear)   What is the patient wearing?: Pants;Incontinence brief Assist for lower body dressing: Total Assistance - Patient < 25%    Putting on/Taking off footwear   What is the patient wearing?: Socks;Shoes Assist for footwear: Maximal Assistance - Patient 25 - 49%       Care Tool Toileting Toileting activity Toileting Activity did not occur (Clothing management and hygiene only): N/A (no void or bm)       Care Tool Bed Mobility Roll left and right activity   Roll left and right assist level: Moderate Assistance - Patient 50 - 74%    Sit to lying activity   Sit to lying assist level: Moderate Assistance - Patient 50 - 74%    Lying to sitting on side of bed activity   Lying to sitting on side of bed assist level: the ability to move from lying on the back to sitting on the side of the bed with no back support.: Moderate Assistance - Patient 50 - 74%     Care Tool Transfers Sit to stand transfer   Sit to  stand assist level: Minimal Assistance - Patient > 75%    Chair/bed transfer   Chair/bed transfer assist level: Minimal Assistance - Patient > 75%     Toilet transfer Toilet transfer activity did not occur: Refused       Care Tool Cognition  Expression of Ideas and Wants Expression of Ideas and Wants: 3. Some difficulty - exhibits some difficulty with expressing needs and ideas (e.g, some words or finishing thoughts) or speech is not clear  Understanding Verbal and Non-Verbal Content Understanding Verbal and Non-Verbal Content: 2. Sometimes understands - understands only basic conversations or simple, direct phrases. Frequently requires cues to understand   Memory/Recall Ability Memory/Recall Ability : Current season;That he or she is in a hospital/hospital unit   Refer to Care Plan for Long Term Goals  SHORT TERM GOAL WEEK 1 OT  Short Term Goal 1 (Week 1): Patient will trasnfer to toilet with mod assist OT Short Term Goal 2 (Week 1): Patient will complete clothing management during toileting wiht mod assist OT Short Term Goal 3 (Week 1): Patient will don pull over shirt with min assist following set up OT Short Term Goal 4 (Week 1): Patient will visually locate needed ADL items left of midline 5* with min cueing  Recommendations for other services: None    Skilled Therapeutic Intervention:   Patient received supine in bed.  Daughter at bedside.  Patient lethargic, but responsive verbally.  Patient with strong right gaze preference, will turn head to left - but eyes do not easily cross midline toward left.  Patient assisted to sit at edge of bed.  Initially listing toward left - but able to correct after minimal cueing.  Patient agreeable to shower, and completed stand step transfer with min assist and increased time.  Patient minimally participative during shower but maintained sitting balance on seat.  Patient transported to sink to dress and complete hygiene.  Limited by lethargy,  left neglect, and decreased sustained attention.  Daughter very encouraging.  Patient left up in wheelchair at end of session with safety belt in place and engaged and call bell/ personal items in reach.   ADL ADL Eating: Not assessed Grooming: Moderate assistance Where Assessed-Grooming: Sitting at sink;Standing at sink Upper Body Bathing: Moderate assistance Where Assessed-Upper Body Bathing: Shower Lower Body Bathing: Moderate assistance Where Assessed-Lower Body Bathing: Shower Upper Body Dressing: Moderate assistance Where Assessed-Upper Body Dressing: Chair Lower Body Dressing: Maximal assistance Where Assessed-Lower Body Dressing: Sitting at sink;Standing at sink Toileting: Unable to assess Tub/Shower Transfer: Unable to assess Tub/Shower Transfer Method: Unable to assess Film/video editor: Minimal assistance Lexicographer Method: Warden/ranger: Transfer tub bench Mobility  Bed Mobility Bed Mobility: Rolling Right;Right Sidelying to Sit;Sit to Sidelying Right;Rolling Left Rolling Right: Supervision/verbal cueing Rolling Left: Dependent - Patient equal 0% Right Sidelying to Sit: Maximal Assistance - Patient 25-49% Left Sidelying to Sit: Moderate Assistance - Patient 50-74% Sit to Sidelying Right: Maximal Assistance - Patient 25-49% Transfers Sit to Stand: Minimal Assistance - Patient > 75% Stand to Sit: Minimal Assistance - Patient > 75%   Discharge Criteria: Patient will be discharged from OT if patient refuses treatment 3 consecutive times without medical reason, if treatment goals not met, if there is a change in medical status, if patient makes no progress towards goals or if patient is discharged from hospital.  The above assessment, treatment plan, treatment alternatives and goals were discussed and mutually agreed upon: by family  Amaani Guilbault  M 07/17/2023, 3:42 PM

## 2023-07-17 NOTE — Plan of Care (Signed)
  Problem: RH Eating Goal: LTG Patient will perform eating w/assist, cues/equip (OT) Description: LTG: Patient will perform eating with assist, with/without cues using equipment (OT) Flowsheets (Taken 07/17/2023 1550) LTG: Pt will perform eating with assistance level of: Supervision/Verbal cueing   Problem: RH Grooming Goal: LTG Patient will perform grooming w/assist,cues/equip (OT) Description: LTG: Patient will perform grooming with assist, with/without cues using equipment (OT) Flowsheets (Taken 07/17/2023 1550) LTG: Pt will perform grooming with assistance level of: Minimal Assistance - Patient > 75%   Problem: RH Bathing Goal: LTG Patient will bathe all body parts with assist levels (OT) Description: LTG: Patient will bathe all body parts with assist levels (OT) Flowsheets (Taken 07/17/2023 1550) LTG: Pt will perform bathing with assistance level/cueing: Minimal Assistance - Patient > 75% LTG: Position pt will perform bathing: Shower   Problem: RH Dressing Goal: LTG Patient will perform upper body dressing (OT) Description: LTG Patient will perform upper body dressing with assist, with/without cues (OT). Flowsheets (Taken 07/17/2023 1550) LTG: Pt will perform upper body dressing with assistance level of: Minimal Assistance - Patient > 75% Goal: LTG Patient will perform lower body dressing w/assist (OT) Description: LTG: Patient will perform lower body dressing with assist, with/without cues in positioning using equipment (OT) Flowsheets (Taken 07/17/2023 1550) LTG: Pt will perform lower body dressing with assistance level of: Minimal Assistance - Patient > 75%   Problem: RH Toileting Goal: LTG Patient will perform toileting task (3/3 steps) with assistance level (OT) Description: LTG: Patient will perform toileting task (3/3 steps) with assistance level (OT)  Flowsheets (Taken 07/17/2023 1550) LTG: Pt will perform toileting task (3/3 steps) with assistance level: Minimal Assistance -  Patient > 75%   Problem: RH Vision Goal: RH LTG Vision Consulting civil engineer) Flowsheets (Taken 07/17/2023 1550) LTG: Vision Goals: Patient will visually locate needed ADL items 15* left of midline with min cueing   Problem: RH Functional Use of Upper Extremity Goal: LTG Patient will use RT/LT upper extremity as a (OT) Description: LTG: Patient will use right/left upper extremity as a stabilizer/gross assist/diminished/nondominant/dominant level with assist, with/without cues during functional activity (OT) Flowsheets (Taken 07/17/2023 1550) LTG: Use of upper extremity in functional activities: LUE as a stabilizer LTG: Pt will use upper extremity in functional activity with assistance level of: Moderate Assistance - Patient 50 - 74%   Problem: RH Toilet Transfers Goal: LTG Patient will perform toilet transfers w/assist (OT) Description: LTG: Patient will perform toilet transfers with assist, with/without cues using equipment (OT) Flowsheets (Taken 07/17/2023 1550) LTG: Pt will perform toilet transfers with assistance level of: Minimal Assistance - Patient > 75%   Problem: RH Tub/Shower Transfers Goal: LTG Patient will perform tub/shower transfers w/assist (OT) Description: LTG: Patient will perform tub/shower transfers with assist, with/without cues using equipment (OT) Flowsheets (Taken 07/17/2023 1550) LTG: Pt will perform tub/shower stall transfers with assistance level of: Minimal Assistance - Patient > 75%   Problem: RH Attention Goal: LTG Patient will demonstrate this level of attention during functional activites (OT) Description: LTG:  Patient will demonstrate this level of attention during functional activites  (OT) Flowsheets (Taken 07/17/2023 1550) Patient will demonstrate this level of attention during functional activites: Selective Patient will demonstrate above attention level in the following environment: Home LTG: Patient will demonstrate this level of attention during functional activites  (OT): Minimal Assistance - Patient > 75%

## 2023-07-17 NOTE — Evaluation (Signed)
 Physical Therapy Assessment and Plan  Patient Details  Name: Kathryn Mcguire MRN: [id number] Date of Birth: Jun 18, 1950  PT Diagnosis: Abnormal posture, Abnormality of gait, Difficulty walking, Hemiplegia dominant, Hypertonia, Impaired cognition, Impaired sensation, and Muscle spasms Rehab Potential: Good ELOS: 10-12   Today's Date: 07/17/2023 PT Individual Time: 1300-1415 PT Individual Time Calculation (min): 75 min    Hospital Problem: Principal Problem:   Acute ischemic right MCA stroke [facility])   Past Medical History:  Past Medical History:  Diagnosis Date   Arthritis    Bipolar depression (HCC)    COPD (chronic obstructive pulmonary disease) (HCC)    Dementia (HCC)    Diabetes mellitus without complication (HCC)    Elevated LFTs    Emphysema of lung (HCC)    GERD (gastroesophageal reflux disease)    History of adenomatous polyps of colon 10/15/2013   Hyperlipemia    Hypertension    UTI (lower urinary tract infection)    Past Surgical History:  Past Surgical History:  Procedure Laterality Date   COLONOSCOPY     ESOPHAGOGASTRODUODENOSCOPY     TONSILLECTOMY     TONSILLECTOMY AND ADENOIDECTOMY     TUBAL LIGATION      Assessment & Plan Clinical Impression: Kathryn Mcguire is a 73 year old female with history of T2DM, HTN, COPD, biploar d/o, dementia who was admitted on 07/11/23 with acute onset of left sided weakness with facial droop and right gaze preference as well as witnessed fall by EMS. CTA head/neck showed occlusion of R-ICA from proximal cervical segment to cavernous segment, decrease in caliber of R-MCA branches and R-ACA c/w left suspected to be due to R-ICA occlusion. She received TNK. Family declined revascularization. 2 D echo showed EF > 75% with denegerative MV and negative bubble study. Hypotension treated with phenylephrine  and IVF to keep SBP> 120.  MRI brain done revealing scattered areas of acute infarct in left basal ganglia and within posterior  aspect of L-ACA territory, mild edema and sulcal effacement in right frontoparietal lobes. Stroke felt to be due to large vessel disease and Dr. Jerri recommended DAPT X 3 months followed by ASA alone.    She was made NPO and NGT placed for nutritional support.  MBS performed on 07/06 to evaluate swallow and showed decreased sensation to trigger swallow with moderate oral residue and episode of sensed aspiration of thin liquids which was eliminated by straw. D1 nectar trialed and advanced to D1 thins but noted to be impulsive. She continues to have bouts of lethargy with stimulation to maintain attention. She continues to be limited by left sided weakness, right gaze preference w/left inattention, decline in baseline cognitive function, left lateral lean,  requires mod to max assist for ADLs and mod assist +2 for SPT.    Patient lives with husband and daughter. Has malnutrition in setting of dementia secondary to decreased appetite over the years and needs reminders to eat. Needed cognitive assistance.   .  Patient transferred to CIR on 07/16/2023 .   Patient currently requires mod with mobility secondary to muscle weakness, muscle joint tightness, and muscle paralysis, impaired timing and sequencing, abnormal tone, unbalanced muscle activation, motor apraxia, and decreased coordination, decreased attention to left and left side neglect, and decreased attention, decreased safety awareness, and delayed processing.  Prior to hospitalization, patient was min with mobility and lived with Family in a House home.  Home access is 2Stairs to enter.  Patient will benefit from skilled PT intervention to maximize safe functional  mobility, minimize fall risk, and decrease caregiver burden for planned discharge home with 24 hour assist.  Anticipate patient will benefit from follow up [facility] at discharge.  PT - End of Session Activity Tolerance: Tolerates 30+ min activity with multiple rests Endurance Deficit: Yes PT  Assessment Rehab Potential (ACUTE/IP ONLY): Good PT Barriers to Discharge: Behavior;Insurance for SNF coverage;Medication compliance;Nutrition means;Incontinence PT Barriers to Discharge Comments: Dementia and bipolar depression PT Patient demonstrates impairments in the following area(s): Balance;Behavior;Motor;Endurance;Sensory;Safety;Perception PT Transfers Functional Problem(s): Bed Mobility;Bed to Chair;Car;Furniture;Floor PT Locomotion Functional Problem(s): Ambulation;Wheelchair Mobility;Stairs PT Plan PT Intensity: Minimum of 1-2 x/day ,45 to 90 minutes PT Frequency: 5 out of 7 days PT Duration Estimated Length of Stay: 10-12 PT Treatment/Interventions: Ambulation/gait training;Cognitive remediation/compensation;Discharge planning;Functional mobility training;DME/adaptive equipment instruction;Pain management;Splinting/orthotics;Psychosocial support;Therapeutic Activities;UE/LE Strength taining/ROM;Visual/perceptual remediation/compensation;Balance/vestibular training;Community reintegration;Disease management/prevention;Functional electrical stimulation;Neuromuscular re-education;Patient/family education;Skin care/wound management;Stair training;UE/LE Coordination activities;Wheelchair propulsion/positioning;Therapeutic Exercise PT Transfers Anticipated Outcome(s): minA PT Locomotion Anticipated Outcome(s): minA PT Recommendation Follow Up Recommendations: Home health PT Patient destination: Home Equipment Recommended: To be determined   PT Evaluation Precautions/Restrictions Precautions Precautions: Fall Recall of Precautions/Restrictions: Impaired Precaution/Restrictions Comments: L hemiplegia, h/o dementia Restrictions Weight Bearing Restrictions Per Provider Order: No General   Vital SignsTherapy Vitals Temp: 97.8 F (36.6 C) Temp Source: Oral Pulse Rate: 70 Resp: 18 BP: 125/73 Patient Position (if appropriate): Sitting Oxygen Therapy SpO2: 100 % O2 Device: Room  Air Pain Pain Assessment Pain Score: 0-No pain Pain Interference Pain Interference Pain Effect on Sleep: 1. Rarely or not at all Pain Interference with Therapy Activities: 1. Rarely or not at all Pain Interference with Day-to-Day Activities: 1. Rarely or not at all Home Living/Prior Functioning Home Living Available Help at Discharge: Family;Available 24 hours/day Type of Home: House Home Access: Stairs to enter [employer] of Steps: 2 Entrance Stairs-Rails: None Home Layout: Two level Alternate Level Stairs-Number of Steps: 2 Alternate Level Stairs-Rails: None Bathroom Shower/Tub: Health visitor: Standard Additional Comments: information provided by daughter  Lives With: Family Prior Function Level of Independence: Needs assistance with ADLs;Requires assistive device for independence Bath: Minimal Dressing: Minimal  Able to Take Stairs?: Yes (need to clarify with family) Driving: No Vocation: Retired Leisure: Hobbies-yes (Comment) Vision/Perception  Vision - History Ability to See in Adequate Light: 0 Adequate Vision - Assessment Additional Comments: Was not able to track to the left possibly due to L neglect Perception Perception: Impaired Preception Impairment Details: Inattention/Neglect;Body Part identification;Spatial orientation Praxis Praxis: Impaired Praxis Impairment Details: Initiation;Ideomotor;Perseveration;Motor planning;Organization;Limb apraxia  Cognition Overall Cognitive Status: Impaired/Different from baseline Arousal/Alertness: Awake/alert Orientation Level: Oriented to person;Oriented to place;Oriented to situation;Disoriented to time Attention: Selective;Divided Focused Attention: Impaired Focused Attention Impairment: Verbal basic Sustained Attention: Impaired Sustained Attention Impairment: Verbal basic;Functional basic Selective Attention: Impaired Selective Attention Impairment: Verbal basic Divided Attention:  Impaired Divided Attention Impairment: Verbal basic Memory: Impaired Memory Impairment: Retrieval deficit;Decreased recall of new information;Decreased long term memory;Decreased short term memory;Storage deficit Decreased Long Term Memory: Verbal basic;Functional basic Decreased Short Term Memory: Verbal basic;Functional basic Awareness: Impaired Awareness Impairment: Intellectual impairment Problem Solving: Impaired Problem Solving Impairment: Verbal basic;Functional basic Executive Function: Self Monitoring;Self Correcting;Organizing Organizing: Impaired Organizing Impairment: Verbal basic;Functional basic Self Monitoring: Impaired Self Monitoring Impairment: Verbal basic;Functional basic Self Correcting: Impaired Self Correcting Impairment: Verbal basic;Functional basic Behaviors: Restless;Impulsive Safety/Judgment: Impaired Comments: L inattention/ left neglect Sensation Sensation Light Touch: Impaired by gross assessment Hot/Cold: Impaired by gross assessment Proprioception: Impaired by gross assessment Stereognosis: Not tested Additional Comments: Absent sensation LUE Coordination Gross Motor Movements are  Fluid and Coordinated: No Fine Motor Movements are Fluid and Coordinated: No Finger Nose Finger Test: Modified testing - unilateral with RUE - Appears WFL - difficult following instructions Motor  Motor Motor: Hemiplegia;Motor apraxia;Abnormal tone;Clonus Motor - Skilled Clinical Observations: pt. has excesssive BL hamstring tone and clonus with quick knee flexion   Trunk/Postural Assessment  Cervical Assessment Cervical Assessment: Within Functional Limits Thoracic Assessment Thoracic Assessment: Within Functional Limits Lumbar Assessment Lumbar Assessment: Within Functional Limits Postural Control Postural Control: Deficits on evaluation Righting Reactions: While in stance pt has anterior L lean  Balance Balance Balance Assessed: No (sitting balance is CGA,  static standing balance minA) Dynamic Sitting Balance Sitting balance - Comments: CGA sitting EOB, with hand reaches across body Extremity Assessment  RUE Assessment RUE Assessment: Within Functional Limits LUE Assessment LUE Assessment: Exceptions to [facility] Active Range of Motion (AROM) Comments: trace adduction of shoulder with facilitation LUE Body System: Neuro Brunstrum levels for arm and hand: Arm;Hand Brunstrum level for arm: Stage I Presynergy Brunstrum level for hand: Stage I Flaccidity RLE Assessment RLE Assessment: Within Functional Limits Passive Range of Motion (PROM) Comments: Full Active Range of Motion (AROM) Comments: Full General Strength Comments: 4/5 LLE Assessment LLE Assessment: Within Functional Limits Passive Range of Motion (PROM) Comments: WFL Active Range of Motion (AROM) Comments: WFL General Strength Comments: 3+-4  Care Tool Care Tool Bed Mobility Roll left and right activity   Roll left and right assist level: Moderate Assistance - Patient 50 - 74%    Sit to lying activity   Sit to lying assist level: Moderate Assistance - Patient 50 - 74%    Lying to sitting on side of bed activity   Lying to sitting on side of bed assist level: the ability to move from lying on the back to sitting on the side of the bed with no back support.: Moderate Assistance - Patient 50 - 74%     Care Tool Transfers Sit to stand transfer   Sit to stand assist level: Minimal Assistance - Patient > 75%    Chair/bed transfer   Chair/bed transfer assist level: Minimal Assistance - Patient > 75%    Car transfer Car transfer activity did not occur: Safety/medical concerns        Care Tool Locomotion Ambulation   Assist level: Minimal Assistance - Patient > 75% Assistive device: Hand held assist Max distance: 60 feet  Walk 10 feet activity   Assist level: Minimal Assistance - Patient > 75% Assistive device: Hand held assist   Walk 50 feet with 2 turns activity    Assist level: Minimal Assistance - Patient > 75% Assistive device: Hand held assist  Walk 150 feet activity Walk 150 feet activity did not occur: Safety/medical concerns      Walk 10 feet on uneven surfaces activity Walk 10 feet on uneven surfaces activity did not occur: Safety/medical concerns      Stairs Stair activity did not occur: Safety/medical concerns        Walk up/down 1 step activity Walk up/down 1 step or curb (drop down) activity did not occur: Safety/medical concerns      Walk up/down 4 steps activity    Walk up/down 4 step or curb (drop down) activity did not occur: Safety/medical concerns    Walk up/down 12 steps activity Walk up/down 12 steps activity did not occur: Safety/medical concerns      Pick up small objects from floor Pick up small object from the floor (from standing position)  activity did not occur: Safety/medical concerns      Wheelchair Is the patient using a wheelchair?: No   Wheelchair activity did not occur: Safety/medical concerns      Wheel 50 feet with 2 turns activity Wheelchair 50 feet with 2 turns activity did not occur: Safety/medical concerns    Wheel 150 feet activity Wheelchair 150 feet activity did not occur: Safety/medical concerns      Refer to Care Plan for Long Term Goals  SHORT TERM GOAL WEEK 1 PT Short Term Goal 1 (Week 1): Pt will ambulate 100 with hand held support PT Short Term Goal 2 (Week 1): Pt will perform 5STS outcmoe measure PT Short Term Goal 3 (Week 1): Pt will perform sit to sidelying with minA PT Short Term Goal 4 (Week 1): Pt will perform car transfer with modA  Recommendations for other services: None   Skilled Therapeutic Intervention Mobility Bed Mobility Bed Mobility: Rolling Right;Right Sidelying to Sit;Sit to Sidelying Right;Rolling Left Rolling Right: Supervision/verbal cueing Rolling Left: Dependent - Patient equal 0% Right Sidelying to Sit: Maximal Assistance - Patient 25-49% Left Sidelying  to Sit: Moderate Assistance - Patient 50-74% Sit to Sidelying Right: Maximal Assistance - Patient 25-49% Transfers Transfers: Sit to Stand;Stand to Sit Sit to Stand: Minimal Assistance - Patient > 75% Stand to Sit: Minimal Assistance - Patient > 75% Transfer (Assistive device): None Locomotion  Gait Ambulation: Yes Gait Assistance: Contact Guard/Touching assist;Other (comment) (hand held support) Gait Distance (Feet): 60 Feet Assistive device: 2 person hand held assist Gait Gait: Yes Gait Pattern: Impaired Gait Pattern: Festinating;Poor foot clearance - left;Decreased stance time - left Gait velocity: decreased Stairs / Additional Locomotion Stairs: No Ramp: Other (comment) (safety medical) Curb: Other (comment) (safety medical) Wheelchair Mobility Wheelchair Mobility: No  Treatment Note  Pt was lying in bed at entrance to room with bed on R side of the room due to L sided neglect. The pt was awake and able to answer simple questions. The pt walked with hand held support about 60 feet with minA. Pt struggled to process cues that required moving to the left, ie rolling, pivoting, stepping. Pt was able to perform those same tasks quickly with CGA to minA going to the right. Overall pt has great physical strength and cognitive and perceptual deficits are the main concern.   Pt was left in wc with chair alarm on and all needs within reach.   Discharge Criteria: Patient will be discharged from PT if patient refuses treatment 3 consecutive times without medical reason, if treatment goals not met, if there is a change in medical status, if patient makes no progress towards goals or if patient is discharged from hospital.  The above assessment, treatment plan, treatment alternatives and goals were discussed and mutually agreed upon: by patient  Johnetta Cordon 07/17/2023, 3:23 PM

## 2023-07-17 NOTE — Evaluation (Signed)
 Speech Language Pathology Assessment and Plan  Patient Details  Name: Kathryn Mcguire MRN: [id number] Date of Birth: [birth year]  SLP Diagnosis: Cognitive Impairments;Dysphagia;Dysarthria  Rehab Potential: Good ELOS: 12-14 days   Today's Date: 07/17/2023 SLP Individual Time: 9046-8961 SLP Individual Time Calculation (min): 45 min  Hospital Problem: Principal Problem:   Acute ischemic right MCA stroke [facility])  Past Medical History:  Past Medical History:  Diagnosis Date   Arthritis    Bipolar depression (HCC)    COPD (chronic obstructive pulmonary disease) (HCC)    Dementia (HCC)    Diabetes mellitus without complication (HCC)    Elevated LFTs    Emphysema of lung (HCC)    GERD (gastroesophageal reflux disease)    History of adenomatous polyps of colon 10/15/2013   Hyperlipemia    Hypertension    UTI (lower urinary tract infection)    Past Surgical History:  Past Surgical History:  Procedure Laterality Date   COLONOSCOPY     ESOPHAGOGASTRODUODENOSCOPY     TONSILLECTOMY     TONSILLECTOMY AND ADENOIDECTOMY     TUBAL LIGATION      Assessment / Plan / Recommendation Clinical Impression  73 y.o. female with history of bipolar, dementia, DM, HTN, HLD, COPD arrived 07/11/23 to [facility] ED presenting symptoms of left side weakness, decreased sensation, facial droop, gaze preference, and left neglect. 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 Family declined interventional radiology given her baseline functional status. 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 CTA head & neck occlusion of the right ICA from proximal cervical segment to cavernous segment with reconstitution of para clinoid segment likely via circle of Willis, decreased caliber and number of right MCA branches, decreased caliber in number of right ACA branches, severe stenosis at the origin of the left nondominant VA, multifocal stenosis and irregularity of the left V2 segment, likely  chronic occlusion of left V4 segment after the origin of the left PICA, moderate stenosis of right PCA P2 segment, moderate stenosis of the origin of the left ICA. Patient chews large amounts of tobacco.   Bedside Swallow Evaluation: Patient exhibits ongoing clinical symptoms of oropharyngeal dysphagia, though improved from those documented after MBS 7/6. Oral mechanism exam was limited due to significant cognitive deficits, though revealed left-sided facial weakness and reduced lingual range of motion. SLP administered thin liquids, puree solids, and Dys2 solids noting improvements to oral residue amounts after the swallow and with labial seal. Patient exhibited improvements in swallow timeliness and immediate swallow trigger across all consistencies. Patient did exhibit delayed cough after all items but unable to determine the relationship between cough and bolus administration. Due to improvements in oral function, plan to assess patient with trial breakfast tray 7/10 with Dys2/thin liquids. Until completion of this assessment, continue current Dys1/thin liquid diet with medications administered crushed in puree.  Cognitive-Linguistic Evaluation: Patient has baseline dementia, however exhibits significantly decreased cognitive functioning skills compared to baseline per family member present during session. Patient completed SLUMS evaluation, scoring 9/30 and demonstrating moderate problem solving deficits, severe delayed recall and attention/left inattention deficits, and severe deficits in the areas of organization and awareness. Patient benefited from mod assist to orient to date, stating year and season correctly independently. Patient unable to recall any events from previous therapy sessions occurring prior to SLP arrival. Patient also exhibits mild dysarthria, primarily characterized by imprecise articulation from facial weakness, though this only minimally impairs intelligibility at the conversation  level. Overall verbal output was limited  and patient had difficulty following one-step directions during oral mechanism exam, however suspect this is due to attention deficits rather than language deficits.  Patient would benefit from continued SLP services targeting all aforementioned deficits. Patient was left in chair with call bell in reach and chair alarm set. SLP will continue to target goals per plan of care.     Skilled Therapeutic Interventions          SLP conducted skilled evaluation session to assess swallowing and cognitive-linguistic function. Utilized bedside swallow evaluation, SLUMS, and patient and/or family interview. Full results above.    SLP Assessment  Patient will need skilled Speech Lanaguage Pathology Services during CIR admission    Recommendations  SLP Diet Recommendations: Dysphagia 1 (Puree);Thin Liquid Administration via: Cup;Straw Medication Administration: Crushed with puree Supervision: Staff to assist with self feeding;Full supervision/cueing for compensatory strategies;Trained caregiver to feed patient Compensations: Slow rate;Small sips/bites;Multiple dry swallows after each bite/sip;Hard cough after swallow Postural Changes and/or Swallow Maneuvers: Seated upright 90 degrees Oral Care Recommendations: Oral care BID Patient destination: Home Follow up Recommendations: Home Health SLP;Outpatient SLP;24 hour supervision/assistance Equipment Recommended: None recommended by SLP    SLP Frequency 3 to 5 out of 7 days   SLP Duration  SLP Intensity  SLP Treatment/Interventions 12-14 days  Minumum of 1-2 x/day, 30 to 90 minutes  Patient/family education;Dysphagia/aspiration precaution training;Internal/external aids;Therapeutic Activities;Functional tasks;Cueing hierarchy;Cognitive remediation/compensation;Environmental controls    Pain Pain Assessment Pain Scale: 0-10 Pain Score: 0-No pain  Prior Functioning Cognitive/Linguistic Baseline: Baseline  deficits Baseline deficit details: dementia at baseline Type of Home: House  Lives With: Family Available Help at Discharge: Family;Available 24 hours/day Education: 1 year of college Vocation: Retired  Architectural technologist Overall Cognitive Status: Impaired/Different from baseline Arousal/Alertness: Awake/alert Orientation Level: Oriented to person;Oriented to place;Oriented to situation;Disoriented to time Year: 2025 Month: June Day of Week: Incorrect Attention: Sustained Focused Attention: Appears intact Sustained Attention: Impaired Sustained Attention Impairment: Verbal basic;Functional basic Memory: Impaired Memory Impairment: Retrieval deficit;Decreased recall of new information;Decreased long term memory;Decreased short term memory;Storage deficit Decreased Long Term Memory: Verbal basic;Functional basic Decreased Short Term Memory: Verbal basic;Functional basic Awareness: Impaired Awareness Impairment: Intellectual impairment;Emergent impairment;Anticipatory impairment Problem Solving: Impaired Problem Solving Impairment: Verbal basic;Functional basic Executive Function: Self Monitoring;Self Correcting;Organizing Organizing: Impaired Organizing Impairment: Verbal basic;Functional basic Self Monitoring: Impaired Self Monitoring Impairment: Verbal basic;Functional basic Self Correcting: Impaired Self Correcting Impairment: Verbal basic;Functional basic Behaviors: Restless;Impulsive Safety/Judgment: Impaired Comments: L inattention/ left neglect  Comprehension Auditory Comprehension Overall Auditory Comprehension: Impaired Commands: Impaired One Step Basic Commands: 50-74% accurate Expression Expression Primary Mode of Expression: Verbal Verbal Expression Overall Verbal Expression: Appears within functional limits for tasks assessed Oral Motor Oral Motor/Sensory Function Overall Oral Motor/Sensory Function: Mild impairment (DIfficult to assess due to  limited command following) Facial ROM: Reduced left;Suspected CN VII (facial) dysfunction Facial Symmetry: Abnormal symmetry left;Suspected CN VII (facial) dysfunction Lingual ROM: Reduced left;Suspected CN XII (hypoglossal) dysfunction Lingual Symmetry: Abnormal symmetry left  Care Tool Care Tool Cognition Ability to hear (with hearing aid or hearing appliances if normally used Ability to hear (with hearing aid or hearing appliances if normally used): 0. Adequate - no difficulty in normal conservation, social interaction, listening to TV   Expression of Ideas and Wants Expression of Ideas and Wants: 3. Some difficulty - exhibits some difficulty with expressing needs and ideas (e.g, some words or finishing thoughts) or speech is not clear   Understanding Verbal and Non-Verbal Content Understanding Verbal and Non-Verbal Content: 2. Sometimes understands -  understands only basic conversations or simple, direct phrases. Frequently requires cues to understand  Memory/Recall Ability Memory/Recall Ability : Current season;That he or she is in a hospital/hospital unit   Bedside Swallowing Assessment General Date of Onset: 07/11/23 Previous Swallow Assessment: MBS 7/6 Diet Prior to this Study: Dysphagia 1 (pureed);Thin liquids (Level 0) Temperature Spikes Noted: No Respiratory Status: Room air History of Recent Intubation: No Behavior/Cognition: Alert;Cooperative;Pleasant mood Oral Cavity - Dentition: Edentulous Self-Feeding Abilities: Needs assist Patient Positioning: Upright in chair/Tumbleform Baseline Vocal Quality: Normal Volitional Cough: Weak Volitional Swallow: Unable to elicit  Oral Care Assessment   Ice Chips Ice chips: Not tested Thin Liquid Thin Liquid: Within functional limits Presentation: Cup;Self Fed Nectar Thick Nectar Thick Liquid: Not tested Honey Thick Honey Thick Liquid: Not tested Puree Puree: Within functional limits Presentation: Spoon;Self Fed Oral Phase  Impairments: Reduced labial seal;Poor awareness of bolus Oral Phase Functional Implications: Left anterior spillage;Prolonged oral transit;Oral residue Solid Solid: Impaired (Dys2 solid) Presentation: Spoon Oral Phase Impairments: Reduced lingual movement/coordination;Poor awareness of bolus;Impaired mastication Oral Phase Functional Implications: Prolonged oral transit;Oral residue BSE Assessment Risk for Aspiration Impact on safety and function: Mild aspiration risk Other Related Risk Factors: Cognitive impairment  Short Term Goals: Week 1: SLP Short Term Goal 1 (Week 1): Pt will tolerate trials of Dys2 solids with no overt s/sx of oral difficulty prior to formal diet upgrade given mod cues for safety awareness and use of compensatory strategies. SLP Short Term Goal 2 (Week 1): Pt will utilize external memory aids to interpret daily schedule and recall basic information re: rehab stay given max verbal cues. SLP Short Term Goal 3 (Week 1): Patient will attend to the left field during functional problem solving tasks in 4/5 opportunities given max multimodal cues. SLP Short Term Goal 4 (Week 1): Patient will demonstrate awareness of cognitive and physical impairments by stating 2 of each when given max multimodal cues. SLP Short Term Goal 5 (Week 1): Patient will orient to date utilizing external aids when given mod multimodal cues. SLP Short Term Goal 6 (Week 1): Patient will utilize SLOP speech intelligibility strategies at the phrase level in 80% of opportunities given max cues.  Refer to Care Plan for Long Term Goals  Recommendations for other services: None   Discharge Criteria: Patient will be discharged from SLP if patient refuses treatment 3 consecutive times without medical reason, if treatment goals not met, if there is a change in medical status, if patient makes no progress towards goals or if patient is discharged from hospital.  The above assessment, treatment plan, treatment  alternatives and goals were discussed and mutually agreed upon: by patient and by family  Milad Bublitz, M.A., CCC-SLP  Dayron Odland A Srah Ake 07/17/2023, 10:57 AM

## 2023-07-17 NOTE — Progress Notes (Signed)
 PROGRESS NOTE   Subjective/Complaints:  Pt without c/o, obvious left neglect  ROS- neg CP, SOB, N/V/D  Objective:   No results found. Recent Labs    07/16/23 0505 07/17/23 0600  WBC 10.4 9.7  HGB 12.0 12.0  HCT 38.7 38.2  PLT 208 237   Recent Labs    07/16/23 1825 07/17/23 0600  NA 132* 134*  K 4.5 4.6  CL 96* 101  CO2 28 23  GLUCOSE 210* 220*  BUN 26* 19  CREATININE 0.79 0.65  CALCIUM  9.3 9.3   No intake or output data in the 24 hours ending 07/17/23 0727      Physical Exam: Vital Signs Blood pressure 99/81, pulse 78, temperature 98 F (36.7 C), temperature source Oral, resp. rate 17, height 5' 3 (1.6 m), weight 56 kg, SpO2 100%.  General: No acute distress Mood and affect are appropriate Heart: Regular rate and rhythm no rubs murmurs or extra sounds Lungs: Clear to auscultation, breathing unlabored, no rales or wheezes Abdomen: Positive bowel sounds, soft nontender to palpation, nondistended Extremities: No clubbing, cyanosis, or edema Skin: No evidence of breakdown, no evidence of rash Neurologic: Cranial nerves II through XII intact, motor strength is 4/5 inRight aned 0/5 left deltoid, bicep, tricep, grip, hip flexor, knee extensors, ankle dorsiflexor and plantar flexor Sensory exam normal sensation to light touch and proprioception in bilateral upper and lower extremities Cerebellar exam normal finger to nose to finger as well as heel to shin in bilateral upper and lower extremities Musculoskeletal: Full range of motion in all 4 extremities. No joint swelling    Assessment/Plan: 1. Functional deficits which require 3+ hours per day of interdisciplinary therapy in a comprehensive inpatient rehab setting. Physiatrist is providing close team supervision and 24 hour management of active medical problems listed below. Physiatrist and rehab team continue to assess barriers to discharge/monitor patient  progress toward functional and medical goals  Care Tool:  Bathing              Bathing assist       Upper Body Dressing/Undressing Upper body dressing        Upper body assist      Lower Body Dressing/Undressing Lower body dressing            Lower body assist       Toileting Toileting    Toileting assist       Transfers Chair/bed transfer  Transfers assist           Locomotion Ambulation   Ambulation assist              Walk 10 feet activity   Assist           Walk 50 feet activity   Assist           Walk 150 feet activity   Assist           Walk 10 feet on uneven surface  activity   Assist           Wheelchair     Assist               Wheelchair 50 feet with  2 turns activity    Assist            Wheelchair 150 feet activity     Assist          Blood pressure 99/81, pulse 78, temperature 98 F (36.7 C), temperature source Oral, resp. rate 17, height 5' 3 (1.6 m), weight 56 kg, SpO2 100%.  Medical Problem List and Plan: 1. 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  Antithrombotics: -DVT/anticoagulation:  Pharmaceutical: Lovenox              -antiplatelet therapy: DAPT X 3 months followed by ASA alone.  3. Pain Management: tylenol  prn 4. Mood/Behavior/Sleep: LCSW to follow for evaluation and support.              --does get up 3-4 times every night (food/tobacco)             -antipsychotic agents: N/a 5. Neuropsych/cognition: This patient is not capable of making decisions on her own behalf. 6. Skin/Wound Care: Routine pressure relief measures.  7. Fluids/Electrolytes/Nutrition: Monitor I/O. Family reports great appetite-->eats all day long 8.  T2DM: Hgb A1c- 12.3. Monitor BS ac/hs and use SSI for elevated  BS             --Was on 70/30 insulin  6 units bid and metformin  1000 mg bid        CBG (last 3)  Recent Labs    07/16/23 1515 07/16/23 2112 07/17/23 0655  GLUCAP 286* 207* 201*   Start semglee  qhs -begin metformin  250mg  bid in AM  9. Dysphagia: On D1, thin liquids.  --Assistance with feeding due to impulsivity and for aspiration precautions.  10. H/o bipolar d/o: Used to have manic episodes/wander, etc but stable off medications.  11. Hyperlipidemia: Continue increased dose lipitor 12. Dementia: On Aricept  (followed by Dr. Georjean in the past)             -per daughters pt was generally independent within the home, needing  supervision with bathing and some ADL's for safety.  13. COPD/Tobacco use: Chews 4 packs of tobacco daily.   14. Blood pressure: Monitor TID--keep SBP > 110. --avoid hypotension given R-ICA occlusion.  Vitals:   07/16/23 2114 07/17/23 0533  BP: 107/70 99/81  Pulse: 74 78  Resp: 17 17  Temp: 98.9 F (37.2 C) 98 F (36.7 C)  SpO2: 100% 100%   May need IVF  15. Mild leucocytosis: WBC up to 10.4 but no signs of fever or infection. Recheck WBC in am. 16. Mild hyponatremia: Na-130 @ admission and has resolved.  17. Elevated LDL: On lipitor.       LOS: 1 days A FACE TO FACE EVALUATION WAS PERFORMED  Prentice FORBES Compton 07/17/2023, 7:27 AM

## 2023-07-18 DIAGNOSIS — I63511 Cerebral infarction due to unspecified occlusion or stenosis of right middle cerebral artery: Secondary | ICD-10-CM | POA: Diagnosis not present

## 2023-07-18 LAB — CBC
HCT: 41.9 % (ref 36.0–46.0)
Hemoglobin: 13.4 g/dL (ref 12.0–15.0)
MCH: 25.9 pg — ABNORMAL LOW (ref 26.0–34.0)
MCHC: 32 g/dL (ref 30.0–36.0)
MCV: 81 fL (ref 80.0–100.0)
Platelets: 227 K/uL (ref 150–400)
RBC: 5.17 MIL/uL — ABNORMAL HIGH (ref 3.87–5.11)
RDW: 13.3 % (ref 11.5–15.5)
WBC: 8.7 K/uL (ref 4.0–10.5)
nRBC: 0 % (ref 0.0–0.2)

## 2023-07-18 LAB — GLUCOSE, CAPILLARY
Glucose-Capillary: 196 mg/dL — ABNORMAL HIGH (ref 70–99)
Glucose-Capillary: 169 mg/dL — ABNORMAL HIGH (ref 70–99)
Glucose-Capillary: 215 mg/dL — ABNORMAL HIGH (ref 70–99)
Glucose-Capillary: 191 mg/dL — ABNORMAL HIGH (ref 70–99)

## 2023-07-18 MED ORDER — SODIUM CHLORIDE 0.9 % IV SOLN: INTRAVENOUS | Status: AC

## 2023-07-18 MED ORDER — INSULIN ASPART PROT & ASPART (70-30 MIX) 100 UNIT/ML ~~LOC~~ SUSP
6.0000 [IU] | Freq: Two times a day (BID) | SUBCUTANEOUS | Status: DC
  Administered 2023-07-18 – 2023-07-31 (×27): 6 [IU] via SUBCUTANEOUS
  Filled 2023-07-18 (×2): qty 10

## 2023-07-18 NOTE — Progress Notes (Signed)
 Physical Therapy Session Note  Patient Details  Name: Kathryn Mcguire MRN: [id number] Date of Birth: 12/09/1950  Today's Date: 07/18/2023 PT Individual Time: 0920-1000 PT Individual Time Calculation (min): 40 min   Short Term Goals: Week 1:  PT Short Term Goal 1 (Week 1): Pt will ambulate 100 with hand held support PT Short Term Goal 2 (Week 1): Pt will perform 5STS outcmoe measure PT Short Term Goal 3 (Week 1): Pt will perform sit to sidelying with minA PT Short Term Goal 4 (Week 1): Pt will perform car transfer with modA  Skilled Therapeutic Interventions/Progress Updates: Patient supine in bed on entrance to room. Patient alert and agreeable to PT session.   Patient with no complaints of pain.  Pt with poor awareness to L sided and required max multimodal cuing throughout session to increase awareness, as well as to engage in therapy due to poor attention (minimally distracting environment).  Therapeutic Activity: Bed Mobility: Pt supine in bed with PTA threading B LE's through pant legs with increased cuing to extend L LE into leg, and overall mod/maxA to fully donn up to waist with pt cued to use R UE to assist while also performing bridge. Pt performed supine<>sit on EOB with minA and increased time provided/required for pt to initiate movement of L LE off of bed with max cuing (target of PTA hand on floor). Pt light minA to advance L LE slightly off of bed. PTA donned personal shoes with pt improving initiation when cued to place feet on floor with shoes on vs with shoes off. Pt noted to have had BM after standing from EOB and was guided back to supine minA (NT took over personal care with missed time indicated). Pt performed same bed mobility with same cuing and level of assistance following nsg care as pt was received in supine.  Transfers: Pt performed sit<>stand pivot transfers throughout session with minA and with HHA. Provided VC for increased pivot length and awareness to L  side to safely sit.  Neuromuscular Re-ed: NMR facilitated during session with focus on neuromuscular control/motor planning/coordination/proprioceptive feedback/weight shifting/L sided awareness. - Pt ambulated around main gym and hallway with minA and R HHA (100') with cuing to increase L sided awareness to objects. With pt heavy focus to R (R cervical rotation) - LAQ with 4lb ankle weight donned L LE with pt requiring increased multimodal cuing to motor plan knee extension, and to maintain without swinging LE with minA to obtain full extension on occasion when pt motor planned movement. Target cuing added to hit PTA hand with foot that ultimately led to PTA passively moving LE into knee extension with cues to hold for 5 seconds, and to control eccentric (max cuing to initiate descent and only did so a few times both rounds). 2 rounds of eccentric control as able following 5 second hold of knee extension.  - Pt ambulated around main gym/hallway loop with 4lb ankle weight donned L LE with manual facilitation to weight shift to R required, and cuing to increase L LE step clearance   NMR performed for improvements in motor control and coordination, balance, sequencing, judgement, and self confidence/ efficacy in performing all aspects of mobility at highest level of independence.   Patient sitting in [facility] with OT hand off in main gym at end of session.      Therapy Documentation Precautions:  Precautions Precautions: Fall Recall of Precautions/Restrictions: Impaired Precaution/Restrictions Comments: L hemiplegia, h/o dementia Restrictions Weight Bearing Restrictions Per Provider Order:  No   Therapy/Group: Individual Therapy  Neda Willenbring PTA 07/18/2023, 11:52 AM

## 2023-07-18 NOTE — Progress Notes (Signed)
 Physical Therapy Session Note  Patient Details  Name: Kathryn Mcguire MRN: [id number] Date of Birth: [birth year]  Today's Date: 07/18/2023 PT Individual Time: 8884-8846 PT Individual Time Calculation (min): 38 min   Short Term Goals: Week 1:  PT Short Term Goal 1 (Week 1): Pt will ambulate 100 with hand held support PT Short Term Goal 2 (Week 1): Pt will perform 5STS outcmoe measure PT Short Term Goal 3 (Week 1): Pt will perform sit to sidelying with minA PT Short Term Goal 4 (Week 1): Pt will perform car transfer with modA  Skilled Therapeutic Interventions/Progress Updates:    pt received in bed and agreeable to therapy. No complaint of pain. Pt with very low initiation throughout session and requiring step by step cueing with increased time for processing/initiation. Bed mobility with step by step cueing and max a for BLE and trunk management. ambulatory transfer to w/c with mod a with RUE HHA. Pt with very poor attention to L side and very sleepy this am. Session focused on gait training with LUE HHA for improved safety and L attention. Noted improvements in gait with repetition, with cues initially needed to prevent stepping on therapist's feet on L side, but none required by end of session. Pt also participated in visual scanning for orange cones placed on L side and carrying back to starting point. Pt required step by step cues at times for problem solving obstacles in her path, etc. Pt returned to room and remained in chair with lap tray in place, was left with all needs in reach and alarm active.   Therapy Documentation Precautions:  Precautions Precautions: Fall Recall of Precautions/Restrictions: Impaired Precaution/Restrictions Comments: L hemiplegia, h/o dementia Restrictions Weight Bearing Restrictions Per Provider Order: No General:       Therapy/Group: Individual Therapy  Schuyler JAYSON Batter 07/18/2023, 12:26 PM

## 2023-07-18 NOTE — Care Management (Signed)
 [facility] Individual Statement of Services  Patient Name:  Kathryn Mcguire  Date:  07/18/2023  Welcome to the [facility].  Our goal is to provide you with an individualized program based on your diagnosis and situation, designed to meet your specific needs.  With this comprehensive rehabilitation program, you will be expected to participate in at least 3 hours of rehabilitation therapies Monday-Friday, with modified therapy programming on the weekends.  Your rehabilitation program will include the following services:  Physical Therapy (PT), Occupational Therapy (OT), Speech Therapy (ST), 24 hour per day rehabilitation nursing, Therapeutic Recreaction (TR), Psychology, Neuropsychology, Care Coordinator, Rehabilitation Medicine, Nutrition Services, Pharmacy Services, and Other  Weekly team conferences will be held on Wednesdays to discuss your progress.  Your Inpatient Rehabilitation Care Coordinator will talk with you frequently to get your input and to update you on team discussions.  Team conferences with you and your family in attendance may also be held.  Expected length of stay: 10-14 days    Overall anticipated outcome: Minimal Assistance  Depending on your progress and recovery, your program may change. Your Inpatient Rehabilitation Care Coordinator will coordinate services and will keep you informed of any changes. Your Inpatient Rehabilitation Care Coordinator's name and contact numbers are listed  below.  The following services may also be recommended but are not provided by the Inpatient Rehabilitation [facility]:  Driving Evaluations Home Health Rehabiltiation Services Outpatient Rehabilitation Services Vocational Rehabilitation   Arrangements will be made to provide these services after discharge if needed.  Arrangements include referral to agencies that provide these services.  Your insurance has been verified to be:  [employer]  Your primary doctor is:  Contractor  Pertinent information will be shared with your doctor and your insurance company.  Inpatient Rehabilitation Care Coordinator:  Graeme Feliciana SILK [phone] or [phone]  Information discussed with and copy given to patient by: Graeme DELENA Feliciana, 07/18/2023, 10:10 AM

## 2023-07-18 NOTE — Progress Notes (Signed)
 Speech Language Pathology Daily Session Note  Patient Details  Name: Kathryn Mcguire MRN: [id number] Date of Birth: 07-04-1950  Today's Date: 07/18/2023 SLP Individual Time: 0730-0808 SLP Individual Time Calculation (min): 38 min  Short Term Goals: Week 1: SLP Short Term Goal 1 (Week 1): Pt will tolerate trials of Dys2 solids with no overt s/sx of oral difficulty prior to formal diet upgrade given mod cues for safety awareness and use of compensatory strategies. SLP Short Term Goal 2 (Week 1): Pt will utilize external memory aids to interpret daily schedule and recall basic information re: rehab stay given max verbal cues. SLP Short Term Goal 3 (Week 1): Patient will attend to the left field during functional problem solving tasks in 4/5 opportunities given max multimodal cues. SLP Short Term Goal 4 (Week 1): Patient will demonstrate awareness of cognitive and physical impairments by stating 2 of each when given max multimodal cues. SLP Short Term Goal 5 (Week 1): Patient will orient to date utilizing external aids when given mod multimodal cues. SLP Short Term Goal 6 (Week 1): Patient will utilize SLOP speech intelligibility strategies at the phrase level in 80% of opportunities given max cues.  Skilled Therapeutic Interventions: SLP conducted skilled therapy session targeting dysphagia and cognitive goals. SLP assessed patient's tolerance of upgraded meal tray. Presented patient with Dys2/thin liquid food items. Patient exhibits improved oral tolerance of Dys2 solids compared to previous trials, however continues to require mod to max cognitive cues to attend to meal, problem solve during meal, and identify items on the left side of the tray. Patient also benefits from mod encouragement for intake, as internal distracters often impair meal engagement. Overall, patient exhibited one instance of immediate cough with thin liquids, benefiting from min cues to increase cough intensity. Patient also  exhibited difficulty with self-feeding, often missing her mouth, though self-imposed rate was much improved compared to shoveling observed during previous session. SLP recommends formal upgrade to Dys2/thin liquid diet due to improved overall tolerance to encourage chewing. Patient continues to demonstrate significant oral and cognitive deficits during meals, thus recommend continuation of full supervision with cues needed for intake, swallow initiation (holding), slow rate, and minimization of distractions. Patient was left in room with call bell in reach and alarm set. SLP will continue to target goals per plan of care.        Pain Pain Assessment Pain Scale: 0-10 Pain Score: 0-No pain  Therapy/Group: Individual Therapy  Kathryn Mcguire, M.A., CCC-SLP  Kathryn Mcguire 07/18/2023, 8:09 AM

## 2023-07-18 NOTE — Progress Notes (Signed)
 PROGRESS NOTE   Subjective/Complaints:  Pt without c/o, obvious left neglect, labs reviewed sodium slightly low , BUN improved  Working with SLP, tolerates thin liq, trialing D2  ROS- neg CP, SOB, N/V/D  Objective:   No results found. Recent Labs    07/17/23 0600 07/18/23 0607  WBC 9.7 8.7  HGB 12.0 13.4  HCT 38.2 41.9  PLT 237 227   Recent Labs    07/16/23 1825 07/17/23 0600  NA 132* 134*  K 4.5 4.6  CL 96* 101  CO2 28 23  GLUCOSE 210* 220*  BUN 26* 19  CREATININE 0.79 0.65  CALCIUM  9.3 9.3    Intake/Output Summary (Last 24 hours) at 07/18/2023 0753 Last data filed at 07/17/2023 1836 Gross per 24 hour  Intake 300 ml  Output --  Net 300 ml        Physical Exam: Vital Signs Blood pressure 106/61, pulse 67, temperature 97.6 F (36.4 C), temperature source Oral, resp. rate 17, height 5' 3 (1.6 m), weight 55.1 kg, SpO2 99%.  General: No acute distress Mood and affect are appropriate Heart: Regular rate and rhythm no rubs murmurs or extra sounds Lungs: Clear to auscultation, breathing unlabored, no rales or wheezes Abdomen: Positive bowel sounds, soft nontender to palpation, nondistended Extremities: No clubbing, cyanosis, or edema Skin: No evidence of breakdown, no evidence of rash Neurologic: Cranial nerves II through XII intact, motor strength is 4/5 inRight aned 0/5 left deltoid, bicep, tricep, grip, hip flexor, knee extensors, ankle dorsiflexor and plantar flexor Sensory exam normal sensation to light touch and proprioception in bilateral upper and lower extremities Cerebellar exam normal finger to nose to finger as well as heel to shin in bilateral upper and lower extremities Musculoskeletal: Full range of motion in all 4 extremities. No joint swelling    Assessment/Plan: 1. Functional deficits which require 3+ hours per day of interdisciplinary therapy in a comprehensive inpatient rehab  setting. Physiatrist is providing close team supervision and 24 hour management of active medical problems listed below. Physiatrist and rehab team continue to assess barriers to discharge/monitor patient progress toward functional and medical goals  Care Tool:  Bathing    Body parts bathed by patient: Chest, Face, Right upper leg, Left upper leg, Abdomen, Front perineal area, Buttocks   Body parts bathed by helper: Right arm, Left arm, Left lower leg, Right lower leg     Bathing assist Assist Level: Moderate Assistance - Patient 50 - 74%     Upper Body Dressing/Undressing Upper body dressing   What is the patient wearing?: Pull over shirt    Upper body assist Assist Level: Moderate Assistance - Patient 50 - 74%    Lower Body Dressing/Undressing Lower body dressing      What is the patient wearing?: Pants, Incontinence brief     Lower body assist Assist for lower body dressing: Total Assistance - Patient < 25%     Toileting Toileting Toileting Activity did not occur Press photographer and hygiene only): N/A (no void or bm)  Toileting assist       Transfers Chair/bed transfer  Transfers assist     Chair/bed transfer assist level: Minimal Assistance -  Patient > 75%     Locomotion Ambulation   Ambulation assist      Assist level: Minimal Assistance - Patient > 75% Assistive device: Hand held assist Max distance: 60 feet   Walk 10 feet activity   Assist     Assist level: Minimal Assistance - Patient > 75% Assistive device: Hand held assist   Walk 50 feet activity   Assist    Assist level: Minimal Assistance - Patient > 75% Assistive device: Hand held assist    Walk 150 feet activity   Assist Walk 150 feet activity did not occur: Safety/medical concerns         Walk 10 feet on uneven surface  activity   Assist Walk 10 feet on uneven surfaces activity did not occur: Safety/medical concerns         Wheelchair     Assist  Is the patient using a wheelchair?: No Type of Wheelchair: Manual    Wheelchair assist level: Dependent - Patient 0%      Wheelchair 50 feet with 2 turns activity    Assist        Assist Level: Dependent - Patient 0%   Wheelchair 150 feet activity     Assist      Assist Level: Dependent - Patient 0%   Blood pressure 106/61, pulse 67, temperature 97.6 F (36.4 C), temperature source Oral, resp. rate 17, height 5' 3 (1.6 m), weight 55.1 kg, SpO2 99%.  Medical Problem List and Plan: 1. 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  Antithrombotics: -DVT/anticoagulation:  Pharmaceutical: Lovenox              -antiplatelet therapy: DAPT X 3 months followed by ASA alone.  3. Pain Management: tylenol  prn 4. Mood/Behavior/Sleep: LCSW to follow for evaluation and support.              --does get up 3-4 times every night (food/tobacco)             -antipsychotic agents: N/a 5. Neuropsych/cognition: This patient is not capable of making decisions on her own behalf. 6. Skin/Wound Care: Routine pressure relief measures.  7. Fluids/Electrolytes/Nutrition: Monitor I/O. Family reports great appetite-->eats all day long 8.  T2DM: Hgb A1c- 12.3. Monitor BS ac/hs and use SSI for elevated BS             --Was on 70/30 insulin  6 units bid and metformin  1000 mg bid        CBG (last 3)  Recent Labs    07/17/23 1635 07/17/23 2012 07/18/23 0612  GLUCAP 170* 192* 196*  Will restart 70/30 insulin  at 6mg  bid on 7/10 9. Dysphagia: On D1, thin liquids.  --Assistance with feeding due to impulsivity and for aspiration precautions.  10. H/o bipolar d/o: Used to have manic episodes/wander, etc but stable off medications.  11. Hyperlipidemia: Continue increased dose lipitor 12. Dementia: On Aricept  (followed by Dr. Georjean in the  past)             -per daughters pt was generally independent within the home, needing  supervision with bathing and some ADL's for safety.  13. COPD/Tobacco use: Chews 4 packs of tobacco daily.   14. Blood pressure: Monitor TID--keep SBP >  110. --avoid hypotension given R-ICA occlusion.  Vitals:   07/17/23 2025 07/18/23 0625  BP: 106/83 106/61  Pulse: 76 67  Resp: 17 17  Temp: 98.2 F (36.8 C) 97.6 F (36.4 C)  SpO2: 99% 99%  Cont  IVF , change to .9NS 15. Mild leucocytosis: WBC up to 10.4 but no signs of fever or infection. Recheck WBC in am. 16. Mild hyponatremia: Na-130 @ admission and has resolved.  17. Elevated LDL: On lipitor.       LOS: 2 days A FACE TO FACE EVALUATION WAS PERFORMED  Kathryn Mcguire 07/18/2023, 7:53 AM

## 2023-07-18 NOTE — Progress Notes (Addendum)
 Patient ID: Kathryn Mcguire, female   DOB: [birth year], 73 y.o.   MRN: [id number]  1012- SW spoke with pt dtr [name] to introduce self, explain role, discuss discharge process, and inform on ELOS. Reports their youngest sister is the primary caregiver. SW explained there will be time for family edu once a d/c date is set. SW shared will provide updates after team conference.   *SW made efforts to meet with pt but pt was sleeping. SW met with her dr Edsel in person. Her sister intends to apply for Medicaid for their mother.    Graeme Jude, MSW, LCSW Office: [phone] Cell: [phone] Fax: [phone]

## 2023-07-18 NOTE — Progress Notes (Signed)
 Occupational Therapy Session Note  Patient Details  Name: Kathryn Mcguire MRN: [id number] Date of Birth: Jun 02, 1950  Today's Date: 07/18/2023 OT Individual Time: 1000-1055 OT Individual Time Calculation (min): 55 min    Short Term Goals: Week 1:  OT Short Term Goal 1 (Week 1): Patient will trasnfer to toilet with mod assist OT Short Term Goal 2 (Week 1): Patient will complete clothing management during toileting wiht mod assist OT Short Term Goal 3 (Week 1): Patient will don pull over shirt with min assist following set up OT Short Term Goal 4 (Week 1): Patient will visually locate needed ADL items left of midline 5* with min cueing  Skilled Therapeutic Interventions/Progress Updates:    Pt received in hand off from PT. At start of session, asked pt if she would like to toilet and pt declined.  Pt transferred stand pivot from wc to mat with min A but max cues to attend to L side to turn safely. Once on mat worked on various activities for [employer]. Pt does have some tone in biceps and fingers. Worked on towel slides and noted trace movement of shoulder with guided facilitation. Hand over hand guiding of L hand with a pouring activity of moving item in cup in L hand to a cup in the R hand.  Wt bearing on L hand to relax bicep tone adding in guiding stretching by sliding hand on mat.  Pt getting fatigued and kept putting head on the table. Asked pt if she needed to go back to room and she said yes.  Once in room asked pt if she needed to toilet before laying down and she said yes.  Ambulated from wc to toilet with mod A to guide her to pathfind way to bathroom and toilet and to support L side.  Once at toilet, cued pt to pull down pants which she needed mod A with, her brief was soiled with loose bowel and pt sat suddenly so it got all over her legs. Pt had to stand for significant time to allow me to thoroughly cleanse her and pt able to stand with close supervision.  Once new briefs on, pt  ambulated to sink to wash hands and brush teeth with mod A due to L inattention and no use of L hand. Worked on Manufacturing systems engineer for feedback with standing upright. Pt recognized she was leaning to the L and self corrected.  Pt ambulated to bed and got into bed to take a short nap until next session.  Bed alarm set and all needs met.   Documentation Precautions:  Precautions Precautions: Fall Recall of Precautions/Restrictions: Impaired Precaution/Restrictions Comments: L hemiplegia, h/o dementia Restrictions Weight Bearing Restrictions Per Provider Order: No   Pain: Pain Assessment Pain Scale: 0-10 Pain Score: 0-No pain ADL: ADL Eating: Not assessed Grooming: Moderate assistance Where Assessed-Grooming: Sitting at sink, Standing at sink Upper Body Bathing: Moderate assistance Where Assessed-Upper Body Bathing: Shower Lower Body Bathing: Moderate assistance Where Assessed-Lower Body Bathing: Shower Upper Body Dressing: Moderate assistance Where Assessed-Upper Body Dressing: Chair Lower Body Dressing: Maximal assistance Where Assessed-Lower Body Dressing: Sitting at sink, Standing at sink Toileting: Maximal assistance Where Assessed-Toileting: Teacher, adult education: Moderate assistance Toilet Transfer Method: Proofreader: Engineer, technical sales: Unable to assess Tub/Shower Transfer Method: Unable to assess Film/video editor: Minimal assistance Film/video editor Method: Warden/ranger: Transfer tub bench  Therapy/Group: Individual Therapy  Isay Perleberg 07/18/2023, 11:46 AM

## 2023-07-18 NOTE — Plan of Care (Signed)
  Problem: RH BOWEL ELIMINATION Goal: RH STG MANAGE BOWEL WITH ASSISTANCE Description: STG Manage Bowel with mod I Assistance. Outcome: Progressing Goal: RH STG MANAGE BOWEL W/MEDICATION W/ASSISTANCE Description: STG Manage Bowel with Medication with mod I Assistance. Outcome: Progressing   Problem: RH BLADDER ELIMINATION Goal: RH STG MANAGE BLADDER WITH ASSISTANCE Description: STG Manage Bladder With toileting /min Assistance Outcome: Progressing   Problem: RH SAFETY Goal: RH STG ADHERE TO SAFETY PRECAUTIONS W/ASSISTANCE/DEVICE Description: STG Adhere to Safety Precautions With supervision Assistance/Device. Outcome: Progressing

## 2023-07-19 DIAGNOSIS — I63511 Cerebral infarction due to unspecified occlusion or stenosis of right middle cerebral artery: Secondary | ICD-10-CM | POA: Diagnosis not present

## 2023-07-19 LAB — GLUCOSE, CAPILLARY
Glucose-Capillary: 158 mg/dL — ABNORMAL HIGH (ref 70–99)
Glucose-Capillary: 129 mg/dL — ABNORMAL HIGH (ref 70–99)
Glucose-Capillary: 121 mg/dL — ABNORMAL HIGH (ref 70–99)
Glucose-Capillary: 119 mg/dL — ABNORMAL HIGH (ref 70–99)

## 2023-07-19 MED ORDER — METHYLPHENIDATE HCL 5 MG PO TABS
2.5000 mg | ORAL_TABLET | Freq: Two times a day (BID) | ORAL | Status: DC
  Administered 2023-07-20 – 2023-07-24 (×9): 2.5 mg via ORAL
  Filled 2023-07-19 (×9): qty 1

## 2023-07-19 NOTE — IPOC Note (Signed)
 Overall Plan of Care [facility]) Patient Details Name: Kathryn Mcguire MRN: [id number] DOB: 07/05/1950  Admitting Diagnosis: Acute ischemic right MCA stroke [facility])  Hospital Problems: Principal Problem:   Acute ischemic right MCA stroke [facility])     Functional Problem List: Nursing Bladder, Bowel, Medication Management, Endurance, Nutrition, Safety, Behavior  PT Balance, Behavior, Motor, Endurance, Sensory, Safety, Perception  OT Balance, Behavior, Cognition, Endurance, Motor, Perception, Safety, Sensory, Vision  SLP Cognition, Motor  TR         Basic ADL's: OT Eating, Grooming, Bathing, Dressing, Toileting     Advanced  ADL's: OT       Transfers: PT Bed Mobility, Bed to Chair, Car, [employer], Civil Service fast streamer, Research scientist (life sciences): PT Ambulation, Psychologist, prison and probation services, Stairs     Additional Impairments: OT    SLP Swallowing, Social Cognition   Problem Solving, Memory, Attention, Awareness  TR      Anticipated Outcomes Item Anticipated Outcome  Self Feeding Min assist  Swallowing  minA   Basic self-care  Min assist  Toileting  Min assist   Bathroom Transfers Min assist  Bowel/Bladder  manage bowel w mod I and bladder w toileting  Transfers  minA  Locomotion  minA  Communication  minA  Cognition  modA  Pain  n/a  Safety/Judgment  manage safety w cues   Therapy Plan: PT Intensity: Minimum of 1-2 x/day ,45 to 90 minutes PT Frequency: 5 out of 7 days PT Duration Estimated Length of Stay: 10-12 OT Intensity: Minimum of 1-2 x/day, 45 to 90 minutes OT Frequency: 5 out of 7 days OT Duration/Estimated Length of Stay: 12-14 days SLP Intensity: Minumum of 1-2 x/day, 30 to 90 minutes SLP Frequency: 3 to 5 out of 7 days SLP Duration/Estimated Length of Stay: 12-14 days   Team Interventions: Nursing Interventions Patient/Family Education, Bladder Management, Bowel Management, Disease Management/Prevention, Medication Management, Cognitive  Remediation/Compensation, Dysphagia/Aspiration Precaution Training, Discharge Planning  PT interventions Ambulation/gait training, Cognitive remediation/compensation, Discharge planning, Functional mobility training, DME/adaptive equipment instruction, Pain management, Splinting/orthotics, Psychosocial support, Therapeutic Activities, UE/LE Strength taining/ROM, Visual/perceptual remediation/compensation, Warden/ranger, Community reintegration, Disease management/prevention, Development worker, international aid stimulation, Neuromuscular re-education, Patient/family education, Skin care/wound management, Stair training, UE/LE Coordination activities, Wheelchair propulsion/positioning, Therapeutic Exercise  OT Interventions Warden/ranger, Discharge planning, Functional electrical stimulation, Self Care/advanced ADL retraining, Therapeutic Activities, Cognitive remediation/compensation, Disease mangement/prevention, Patient/family education, Functional mobility training, Skin care/wound managment, Therapeutic Exercise, Visual/perceptual remediation/compensation, DME/adaptive equipment instruction, Neuromuscular re-education, Splinting/orthotics, UE/LE Strength taining/ROM, Wheelchair propulsion/positioning  SLP Interventions Patient/family education, Dysphagia/aspiration precaution training, Internal/external aids, Therapeutic Activities, Functional tasks, Cueing hierarchy, Cognitive remediation/compensation, Environmental controls  TR Interventions    SW/CM Interventions     Barriers to Discharge MD  Medical stability  Nursing Decreased caregiver support, Home environment access/layout, Incontinence, Nutrition means 2 level 2 ste no rail w spouse and dtr; up 3-4 times/night PTA, wanders  PT Behavior, Insurance for SNF coverage, Medication compliance, Nutrition means, Incontinence Dementia and bipolar depression  OT      SLP      SW       Team Discharge Planning: Destination: PT-Home  ,OT- Home , SLP-Home Projected Follow-up: PT-Home health PT, OT-  Home health OT, Outpatient OT, SLP-Home Health SLP, Outpatient SLP, 24 hour supervision/assistance Projected Equipment Needs: PT-To be determined, OT- To be determined, SLP-None recommended by SLP Equipment Details: PT- , OT-  Patient/family involved in discharge planning: PT- Patient,  OT-Patient, Family member/caregiver, SLP-Patient, Family member/caregiver  MD ELOS: 14-17d Medical Rehab  Prognosis:  Good Assessment: The patient has been admitted for CIR therapies with the diagnosis of acute R MCA stroke . The team will be addressing functional mobility, strength, stamina, balance, safety, adaptive techniques and equipment, self-care, bowel and bladder mgt, patient and caregiver education, adequate oral hydration currently receiving IVF. Goals have been set at mod I. Anticipated discharge destination is home.        See Team Conference Notes for weekly updates to the plan of care

## 2023-07-19 NOTE — Progress Notes (Signed)
 Occupational Therapy Session Note  Patient Details  Name: Kathryn Mcguire MRN: [id number] Date of Birth: August 19, 1950  {CHL IP REHAB OT TIME CALCULATIONS:[id number]}   Short Term Goals: Week 1:  OT Short Term Goal 1 (Week 1): Patient will trasnfer to toilet with mod assist OT Short Term Goal 2 (Week 1): Patient will complete clothing management during toileting wiht mod assist OT Short Term Goal 3 (Week 1): Patient will don pull over shirt with min assist following set up OT Short Term Goal 4 (Week 1): Patient will visually locate needed ADL items left of midline 5* with min cueing  Skilled Therapeutic Interventions/Progress Updates:    Patient agreeable to participate in OT session. Reports *** pain level.   Patient participated in skilled OT session focusing on ***. Therapist facilitated/assessed/developed/educated/integrated/elicited *** in order to improve/facilitate/promote    Therapy Documentation Precautions:  Precautions Precautions: Fall Recall of Precautions/Restrictions: Impaired Precaution/Restrictions Comments: L hemiplegia, h/o dementia Restrictions Weight Bearing Restrictions Per Provider Order: No  Therapy/Group: Individual Therapy  Leita Howell, OTR/L,CBIS  Supplemental OT - [facility] and [facility] Secure Chat Preferred   07/19/2023, 4:53 PM

## 2023-07-19 NOTE — Progress Notes (Signed)
 Inpatient Rehabilitation Care Coordinator Assessment and Plan Patient Details  Name: Kathryn Mcguire MRN: [id number] Date of Birth: [birth year]  Today's Date: 07/19/2023  Hospital Problems: Principal Problem:   Acute ischemic right MCA stroke [facility])  Past Medical History:  Past Medical History:  Diagnosis Date   Arthritis    Bipolar depression (HCC)    COPD (chronic obstructive pulmonary disease) (HCC)    Dementia (HCC)    Diabetes mellitus without complication (HCC)    Elevated LFTs    Emphysema of lung (HCC)    GERD (gastroesophageal reflux disease)    History of adenomatous polyps of colon 10/15/2013   Hyperlipemia    Hypertension    UTI (lower urinary tract infection)    Past Surgical History:  Past Surgical History:  Procedure Laterality Date   COLONOSCOPY     ESOPHAGOGASTRODUODENOSCOPY     TONSILLECTOMY     TONSILLECTOMY AND ADENOIDECTOMY     TUBAL LIGATION     Social History:  reports that she has been smoking cigarettes. Her smokeless tobacco use includes chew. She reports that she does not currently use alcohol. She reports that she does not use drugs.  Family / Support Systems Marital Status: Divorced Patient Roles: Parent Spouse/Significant Other: Divorced Other Supports: ex-husband Anticipated Caregiver: youngest dtr Darice Ability/Limitations of Caregiver: Primary caregiver is dtr Darice. PRNsupport from other dtr [name]; granddatr and ex-husband as well. Caregiver Availability: 24/7 Family Dynamics: Pt lives in the home with her dtr Darice, her granddaughter and ex-husband  Social History Preferred language: English Religion: [employer] - How often do you need to have someone help you when you read instructions, pamphlets, or other written material from your doctor or pharmacy?: Patient unable to respond Employment Status: Retired   Abuse/Neglect Abuse/Neglect Assessment Can Be Completed: Unable to assess, patient is non-responsive or  altered mental status Physical Abuse: Denies Verbal Abuse: Denies Sexual Abuse: Denies Exploitation of patient/patient's resources: Denies Self-Neglect: Denies  Patient response to: Social Isolation - How often do you feel lonely or isolated from those around you?: Patient unable to respond  Emotional Status    Patient / Family Perceptions, Expectations & Goals Pt/Family understanding of illness & functional limitations: Pt family has a general understanding of care needs Premorbid pt/family roles/activities: supervision Anticipated changes in roles/activities/participation: continued supervision  [employer]: None Premorbid Home Care/DME Agencies: None Transportation available at discharge: TBD Is the patient able to respond to transportation needs?: Yes In the past 12 months, has lack of transportation kept you from medical appointments or from getting medications?: No In the past 12 months, has lack of transportation kept you from meetings, work, or from getting things needed for daily living?: No Resource referrals recommended: Neuropsychology  Discharge Planning Living Arrangements: Children, Other relatives, Non-relatives/Friends Support Systems: Children, Other relatives, Friends/neighbors Type of Residence: Private residence Insurance Resources: Media planner (specify) ([employer] Medicare) Financial Resources: Restaurant manager, fast food Screen Referred: No Living Expenses: Lives with family Money Management: Family Does the patient have any problems obtaining your medications?: No Home Management: Pt family manage home care needs Patient/Family Preliminary Plans: no changes Care Coordinator Barriers to Discharge: Decreased caregiver support, Lack of/limited family support, Insurance for SNF coverage Care Coordinator Anticipated Follow Up Needs: HH/OP Expected length of stay: 10-14 days  Clinical Impression Chart audit, collateral reports  to complete assessment.   Zaleigh Bermingham A Abu Heavin 07/19/2023, 1:57 PM

## 2023-07-19 NOTE — Progress Notes (Signed)
 Speech Language Pathology Daily Session Note  Patient Details  Name: Kathryn Mcguire MRN: [id number] Date of Birth: [birth year]  Today's Date: 07/19/2023 SLP Individual Time: 9199-9154 SLP Individual Time Calculation (min): 45 min  Short Term Goals: Week 1: SLP Short Term Goal 1 (Week 1): Pt will tolerate trials of Dys2 solids with no overt s/sx of oral difficulty prior to formal diet upgrade given mod cues for safety awareness and use of compensatory strategies. SLP Short Term Goal 2 (Week 1): Pt will utilize external memory aids to interpret daily schedule and recall basic information re: rehab stay given max verbal cues. SLP Short Term Goal 3 (Week 1): Patient will attend to the left field during functional problem solving tasks in 4/5 opportunities given max multimodal cues. SLP Short Term Goal 4 (Week 1): Patient will demonstrate awareness of cognitive and physical impairments by stating 2 of each when given max multimodal cues. SLP Short Term Goal 5 (Week 1): Patient will orient to date utilizing external aids when given mod multimodal cues. SLP Short Term Goal 6 (Week 1): Patient will utilize SLOP speech intelligibility strategies at the phrase level in 80% of opportunities given max cues.  Skilled Therapeutic Interventions: Skilled therapy session focused on dysphagia and cognitive goals. Patient oriented x4. SLP facilitated session by providing set up A for breakfast tray of D2/thin liquids. Patient consumed solids with relatively timely mastication and improved oral clearance. Patient continues to present with impulsivity and requires mod-maxA to reduce bolus size. Patient with intermittent coughing during all PO consistencies this date, which may be indicative of bolus misdirection, though patient with coughing before/after PO intake as well. LPN aware. Recommend continuation of current diet with full supervision for compensatory strategies. Encourage liquid intake frequently. SLP  targeted cognitive goals through L attention task. Patient required max-total A to sequence days of the week placed across table span. Patient very lethargic this date requiring max-totalA to sustain attention to task at the end of the session. Remaining 15 minutes of session missed due to lethargy and ceasing participation in session. Patient left in bed with alarm set and call bell in reach. Continue POC.  Pain None reported   Therapy/Group: Individual Therapy  Laneisha Mino M.A., CCC-SLP 07/19/2023, 7:44 AM

## 2023-07-19 NOTE — Progress Notes (Signed)
 Physical Therapy Session Note  Patient Details  Name: Kathryn Mcguire MRN: [id number] Date of Birth: 19-Mar-1950  Today's Date: 07/19/2023 PT Individual Time: 8992-8882 PT Individual Time Calculation (min): 70 min   Short Term Goals: Week 1:  PT Short Term Goal 1 (Week 1): Pt will ambulate 100 with hand held support PT Short Term Goal 2 (Week 1): Pt will perform 5STS outcmoe measure PT Short Term Goal 3 (Week 1): Pt will perform sit to sidelying with minA PT Short Term Goal 4 (Week 1): Pt will perform car transfer with modA  Skilled Therapeutic Interventions/Progress Updates: Patient supine in bed on entrance to room. Patient alert and agreeable to PT session.   Patient reported no pain during session.   Therapeutic Activity: Bed Mobility: Pt required maxA to donn personal pants due to poor attention to task. Pt frequently would say okay when cued to either bridge or perform side roll but would remain static in supine with no initiation. Pt eventually performed slight bridge for PTA to donn pants. Pt then cued to transition to sitting on EOB with pt doing so supervision and improved pivot to advance B LE slightly off of bed. Pt cued to place B feet on floor, but ultimately initiated cue following PTA donning B personal shoes.  Transfers: Pt performed sit<>stand transfers throughout session with CGA. Provided VC for increasing awareness to L side to safely sit.  5 x STS took pt 5 min 47 seconds to perform that required max multimodal cuing to stand with pt frequently saying okay with no initiation. Pt with close supervision throughout. Pt performed car transfer with CGA functionally, but required maxA cognitively to follow cues to transfer safely. Pt would say okay when cued to stand to transfer to car with pt attempting to place L LE into car while sitting in WC. Pt would attempt to stand several times, but would sit back into WC. Ultimately pt required CGA to initiate movement but  would then attempt to transfer into car either feet or head first and required multimodal cuing to pivot towards PTA to sit. Max cuing required for pt to pivot B LE into car, and out of car with light minA to advance L LE out. Overall, pt required increased time to initiate transfer safely.  Neuromuscular Re-ed: - Pt ambulated 200'+ around main gym/hallway with CGA/minA to maintain standing balance, but maxA to safely navigate doorways/obstacles on L due to poor awareness. Pt also with L drift that required mod/maxA to correct to avoid running int things on L.  NMR performed for improvements in motor control and coordination, balance, sequencing, judgement, and self confidence/ efficacy in performing all aspects of mobility at highest level of independence.   Patient sitting in WC at end of session with brakes locked, belt alarm set, and all needs within reach.      Therapy Documentation Precautions:  Precautions Precautions: Fall Recall of Precautions/Restrictions: Impaired Precaution/Restrictions Comments: L hemiplegia, h/o dementia Restrictions Weight Bearing Restrictions Per Provider Order: No Therapy/Group: Individual Therapy  Pollie Poma PTA 07/19/2023, 12:08 PM

## 2023-07-19 NOTE — Progress Notes (Signed)
 Occupational Therapy Session Note  Patient Details  Name: Kathryn Mcguire MRN: [id number] Date of Birth: 15-Sep-1950  Today's Date: 07/19/2023 OT Individual Time: 1300-1409 OT Individual Time Calculation (min): 69 min    Short Term Goals: Week 1:  OT Short Term Goal 1 (Week 1): Patient will trasnfer to toilet with mod assist OT Short Term Goal 2 (Week 1): Patient will complete clothing management during toileting wiht mod assist OT Short Term Goal 3 (Week 1): Patient will don pull over shirt with min assist following set up OT Short Term Goal 4 (Week 1): Patient will visually locate needed ADL items left of midline 5* with min cueing  Skilled Therapeutic Interventions/Progress Updates:     Pt received sitting up in wc with DTR present in room. Pt receptive to skilled OT session reporting 0/10 pain- OT offering intermittent rest breaks, repositioning, and therapeutic support to optimize participation in therapy session. Focused this session on functional cognition, L UE NMRE, activity tolerance, and L side attention. Transported Pt to/from therapy spaces in wc for time management and energy conservation. In ADL apartment, engaged Pt in completing familiar IADL activities to address attention, task initiation, awareness, and L side attention deficits. Began session with Pt tasked with completing sorting task placing forks, spoons, and butter knives into correct spaces in utensil holder- increased time and mod multimodal cues required to initiate and continue task, however Pt able to complete with 90% accuracy and correct mistakes following mod verbal cues. Pt then tasked with ambulating around ADL apartment to locate fruits/veggies specified by OT. She required CGA-min HHA during functional mobility, however max A required to avoid obstacles on L side and to ensure safety. When items were located in Pt's R visual field, she initiated retrieving them with min multimodal cues, however Pt unable to  locate items in L visual field despite max cues provided. Pt did not request rest breaks when fatigued during activity, however she would initiate walking over toward wc or couch to express need for rest break. Tasked Pt with completing cleaning activity using wash cloth to clean countertop and appliance in ADL apartment- CGA-min A provided for balance and mod multimodal cues provided for initiation and to continue task. Facilitated L UE NMRE into activity by positioning Pt's L UE onto wash cloth and completing table glides with max HOH A during cleaning task. Pt noted to have trace shoulder, bicep, and tricep extension at this time and presence of some tone in L elbow- mod improvement following WB'ing and gentle AAROM. Pt was left resting in wc with call bell in reach, seatbelt alarm on, and all needs met.    Therapy Documentation Precautions:  Precautions Precautions: Fall Recall of Precautions/Restrictions: Impaired Precaution/Restrictions Comments: L hemiplegia, h/o dementia Restrictions Weight Bearing Restrictions Per Provider Order: No   Therapy/Group: Individual Therapy  Kathryn Mcguire 07/19/2023, 7:57 AM

## 2023-07-19 NOTE — Progress Notes (Signed)
 PROGRESS NOTE   Subjective/Complaints:  No issues overnite, SLP is evaluating pt, discussed goals of drinking without supervision, is on thin liq but on IVF for low fluid intake   ROS- neg CP, SOB, N/V/D  Objective:   No results found. Recent Labs    07/17/23 0600 07/18/23 0607  WBC 9.7 8.7  HGB 12.0 13.4  HCT 38.2 41.9  PLT 237 227   Recent Labs    07/16/23 1825 07/17/23 0600  NA 132* 134*  K 4.5 4.6  CL 96* 101  CO2 28 23  GLUCOSE 210* 220*  BUN 26* 19  CREATININE 0.79 0.65  CALCIUM  9.3 9.3    Intake/Output Summary (Last 24 hours) at 07/19/2023 0801 Last data filed at 07/18/2023 2000 Gross per 24 hour  Intake 647 ml  Output --  Net 647 ml        Physical Exam: Vital Signs Blood pressure (!) 145/83, pulse 87, temperature 97.7 F (36.5 C), resp. rate 13, height 5' 3 (1.6 m), weight 55.9 kg, SpO2 99%.  General: No acute distress Mood and affect are appropriate Heart: Regular rate and rhythm no rubs murmurs or extra sounds Lungs: Clear to auscultation, breathing unlabored, no rales or wheezes Abdomen: Positive bowel sounds, soft nontender to palpation, nondistended Extremities: No clubbing, cyanosis, or edema Skin: No evidence of breakdown, no evidence of rash Neurologic: Cranial nerves II through XII intact, motor strength is 4/5 in Right aned 0/5 left deltoid, bicep, tricep, grip, hip flexor, knee extensors, ankle dorsiflexor and plantar flexor Sensory exam normal sensation to light touch and proprioception in bilateral upper and lower extremities Cerebellar exam normal finger to nose to finger as well as heel to shin in bilateral upper and lower extremities Musculoskeletal: Full range of motion in all 4 extremities. No joint swelling    Assessment/Plan: 1. Functional deficits which require 3+ hours per day of interdisciplinary therapy in a comprehensive inpatient rehab setting. Physiatrist is  providing close team supervision and 24 hour management of active medical problems listed below. Physiatrist and rehab team continue to assess barriers to discharge/monitor patient progress toward functional and medical goals  Care Tool:  Bathing    Body parts bathed by patient: Chest, Face, Right upper leg, Left upper leg, Abdomen, Front perineal area, Buttocks   Body parts bathed by helper: Right arm, Left arm, Left lower leg, Right lower leg     Bathing assist Assist Level: Moderate Assistance - Patient 50 - 74%     Upper Body Dressing/Undressing Upper body dressing   What is the patient wearing?: Pull over shirt    Upper body assist Assist Level: Moderate Assistance - Patient 50 - 74%    Lower Body Dressing/Undressing Lower body dressing      What is the patient wearing?: Pants, Incontinence brief     Lower body assist Assist for lower body dressing: Total Assistance - Patient < 25%     Toileting Toileting Toileting Activity did not occur Press photographer and hygiene only): N/A (no void or bm)  Toileting assist Assist for toileting: Maximal Assistance - Patient 25 - 49%     Transfers Chair/bed transfer  Transfers assist  Chair/bed  transfer activity did not occur: Safety/medical concerns  Chair/bed transfer assist level: Minimal Assistance - Patient > 75%     Locomotion Ambulation   Ambulation assist      Assist level: Minimal Assistance - Patient > 75% Assistive device: Hand held assist Max distance: 60 feet   Walk 10 feet activity   Assist     Assist level: Minimal Assistance - Patient > 75% Assistive device: Hand held assist   Walk 50 feet activity   Assist    Assist level: Minimal Assistance - Patient > 75% Assistive device: Hand held assist    Walk 150 feet activity   Assist Walk 150 feet activity did not occur: Safety/medical concerns         Walk 10 feet on uneven surface  activity   Assist Walk 10 feet on uneven  surfaces activity did not occur: Safety/medical concerns         Wheelchair     Assist Is the patient using a wheelchair?: Yes Type of Wheelchair: Manual    Wheelchair assist level: Dependent - Patient 0%      Wheelchair 50 feet with 2 turns activity    Assist        Assist Level: Dependent - Patient 0%   Wheelchair 150 feet activity     Assist      Assist Level: Dependent - Patient 0%   Blood pressure (!) 145/83, pulse 87, temperature 97.7 F (36.5 C), resp. rate 13, height 5' 3 (1.6 m), weight 55.9 kg, SpO2 99%.  Medical Problem List and Plan: 1. 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  Antithrombotics: -DVT/anticoagulation:  Pharmaceutical: Lovenox              -antiplatelet therapy: DAPT X 3 months followed by ASA alone.  3. Pain Management: tylenol  prn 4. Mood/Behavior/Sleep: LCSW to follow for evaluation and support.              --does get up 3-4 times every night (food/tobacco)             -antipsychotic agents: N/a 5. Neuropsych/cognition: This patient is not capable of making decisions on her own behalf. 6. Skin/Wound Care: Routine pressure relief measures.  7. Fluids/Electrolytes/Nutrition: Monitor I/O. Family reports great appetite-->eats all day long 8.  T2DM: Hgb A1c- 12.3. Monitor BS ac/hs and use SSI for elevated BS             --Was on 70/30 insulin  6 units bid and metformin  1000 mg bid        CBG (last 3)  Recent Labs    07/18/23 1649 07/18/23 1958 07/19/23 0535  GLUCAP 215* 191* 158*  Will restart 70/30 insulin  at 6mg  bid on 7/10, improved CBG this am  but elevated yesterday PM, increase to 8U 9. Dysphagia: On D1, thin liquids.  --Assistance with feeding due to impulsivity and for aspiration precautions.  10. H/o bipolar d/o: Used to have manic episodes/wander,  etc but stable off medications.  11. Hyperlipidemia: Continue increased dose lipitor 12. Dementia: On Aricept  (followed by Dr. Georjean in the past)             -per daughters pt was generally independent within the home, needing  supervision with  bathing and some ADL's for safety.  13. COPD/Tobacco use: Chews 4 packs of tobacco daily.   14. Blood pressure: Monitor TID--keep SBP > 110. --avoid hypotension given R-ICA occlusion.  Vitals:   07/18/23 1955 07/19/23 0534  BP: 126/75 (!) 145/83  Pulse: 78 87  Resp: 16 13  Temp: 98.4 F (36.9 C) 97.7 F (36.5 C)  SpO2: 100% 99%  May d/c IVF after current bag 15. Mild leucocytosis: WBC up to 10.4 but no signs of fever or infection.     Latest Ref Rng & Units 07/18/2023    6:07 AM 07/17/2023    6:00 AM 07/16/2023    5:05 AM  CBC  WBC 4.0 - 10.5 K/uL 8.7  9.7  10.4   Hemoglobin 12.0 - 15.0 g/dL 86.5  87.9  87.9   Hematocrit 36.0 - 46.0 % 41.9  38.2  38.7   Platelets 150 - 400 K/uL 227  237  208     16. Mild hyponatremia: Na-130 @ admission improved but still borderline low, on .9NS recheck 7/15,      Latest Ref Rng & Units 07/17/2023    6:00 AM 07/16/2023    6:25 PM 07/16/2023    5:05 AM  BMP  Glucose 70 - 99 mg/dL 779  789  875   BUN 8 - 23 mg/dL 19  26  17    Creatinine 0.44 - 1.00 mg/dL 9.34  9.20  9.34   Sodium 135 - 145 mmol/L 134  132  137   Potassium 3.5 - 5.1 mmol/L 4.6  4.5  4.3   Chloride 98 - 111 mmol/L 101  96  101   CO2 22 - 32 mmol/L 23  28  22    Calcium  8.9 - 10.3 mg/dL 9.3  9.3  9.3     17. Elevated LDL: On lipitor.       LOS: 3 days A FACE TO FACE EVALUATION WAS PERFORMED  Prentice FORBES Compton 07/19/2023, 8:01 AM

## 2023-07-20 DIAGNOSIS — R197 Diarrhea, unspecified: Secondary | ICD-10-CM

## 2023-07-20 DIAGNOSIS — I63511 Cerebral infarction due to unspecified occlusion or stenosis of right middle cerebral artery: Secondary | ICD-10-CM | POA: Diagnosis not present

## 2023-07-20 DIAGNOSIS — R739 Hyperglycemia, unspecified: Secondary | ICD-10-CM | POA: Diagnosis not present

## 2023-07-20 DIAGNOSIS — I10 Essential (primary) hypertension: Secondary | ICD-10-CM

## 2023-07-20 LAB — GLUCOSE, CAPILLARY
Glucose-Capillary: 126 mg/dL — ABNORMAL HIGH (ref 70–99)
Glucose-Capillary: 165 mg/dL — ABNORMAL HIGH (ref 70–99)
Glucose-Capillary: 136 mg/dL — ABNORMAL HIGH (ref 70–99)
Glucose-Capillary: 84 mg/dL (ref 70–99)

## 2023-07-20 MED ORDER — LOPERAMIDE HCL 2 MG PO CAPS
2.0000 mg | ORAL_CAPSULE | ORAL | Status: DC | PRN
  Administered 2023-07-20: 2 mg via ORAL
  Filled 2023-07-20: qty 1

## 2023-07-20 NOTE — Progress Notes (Signed)
 Physical Therapy Session Note  Patient Details  Name: Kathryn Mcguire MRN: [id number] Date of Birth: 1950-04-28  Today's Date: 07/20/2023 PT Individual Time: 0920-1015 PT Individual Time Calculation (min): 55 min   Short Term Goals: Week 1:  PT Short Term Goal 1 (Week 1): Pt will ambulate 100 with hand held support PT Short Term Goal 2 (Week 1): Pt will perform 5STS outcmoe measure PT Short Term Goal 3 (Week 1): Pt will perform sit to sidelying with minA PT Short Term Goal 4 (Week 1): Pt will perform car transfer with modA  Skilled Therapeutic Interventions/Progress Updates: Patient semi-reclined in room with family present on entrance to room. Patient alert and agreeable to PT session.   Patient reported no pain during session. PTA, pt and pt family discussed pt's CLOF and severity of L sided inattention/awareness and decreased initiation to cues. Pt presented with slight improvement in initiation to tasks this session with this PTA vs previous sessions, but still overall presented with requiring max cuing to increase awareness when ambulating and staying on task.  Therapeutic Activity: Bed Mobility: Pt performed supine<sit on EOB with minA this session, but increased awareness to pivot to L to get to EOB with target cue. Pt required donning of personal shoes to initiate cue of placing B feet on floor.  Transfers: Pt performed sit<>stand transfers throughout session with CGA/minA with no AD for safety due to poor awareness, and would attempt to sit in unsafely (close to the edge with L closer than R hip that required multimodal cues to scoot to safe position).  Neuromuscular Re-ed: - Pt ambulated from room<>day room gym and throughout session without AD and with CGA/minA to maintain standing balance, but maxA cognitively due to poor awareness that required cuing to avoid hitting objects on L, and needed redirection due to L drift. Pt cued throughout session when ambulating to look at  objects on L side of hallway (specifically to increase head rotation to L due to R rotated presentation).  - Pt cued to turn to L while standing as nsg got medications ready. Pt with CGA/light minA to safely turn with cues to increase head rotation to L. Pt then cued to ambulate to table and pick up various items and place on table behind with max cuing to place object behind vs on the same table. Pt also cued to toss bag to board vs walking to board to lightly toss (max cuing to stay on task). - Pt ambulated around day room gym with instructions to find tall colored cones placed around room with max cuing to avoid bumping into objects around room on L (cues to increase awareness/gaze to L side). Pt required VC to stay on task (would stand in place with no initiation, or attempt to sit vs still looking for cones). Pt also with decreased visual tracking at times to locate cones, but able to do so with hinted cues.  - Pt cued to toss bags to board on floor while making pivot turns to the L to pick up bags. Pt required maxA to stay on task and to maintain sequence of picking up bag, turning and tossing to board. Pt with poor awareness to L and would pick up bags from bin on R vs table on L.  - Pt cued to pick up rings on one table, and then place on top of orange cones on another table in day room with max cuing to locate rings on L side of table per L  neglect, and to recall where rings were located (pt recalled on one occasion, but sat on mat after placing on cone. PTA stated fatigue after sitting).  Pt performed NMRE in minimally distracting environment.   NMR performed for improvements in motor control and coordination, balance, sequencing, judgement, and self confidence/ efficacy in performing all aspects of mobility at highest level of independence.   Patient sitting in WC at end of session with brakes locked, belt alarm set, and all needs within reach.      Therapy Documentation Precautions:   Precautions Precautions: Fall Recall of Precautions/Restrictions: Impaired Precaution/Restrictions Comments: L hemiplegia, h/o dementia Restrictions Weight Bearing Restrictions Per Provider Order: No  Therapy/Group: Individual Therapy  Linus Weckerly PTA 07/20/2023, 12:59 PM

## 2023-07-20 NOTE — Progress Notes (Signed)
 Occupational Therapy Session Note  Patient Details  Name: Kathryn Mcguire MRN: [id number] Date of Birth: 03-03-1950  Today's Date: 07/20/2023 OT Individual Time: 1050-1200 OT Individual Time Calculation (min): 70 min    Short Term Goals: Week 1:  OT Short Term Goal 1 (Week 1): Patient will trasnfer to toilet with mod assist OT Short Term Goal 2 (Week 1): Patient will complete clothing management during toileting wiht mod assist OT Short Term Goal 3 (Week 1): Patient will don pull over shirt with min assist following set up OT Short Term Goal 4 (Week 1): Patient will visually locate needed ADL items left of midline 5* with min cueing  Skilled Therapeutic Interventions/Progress Updates:    Pt received sitting with no c/o pain, agreeable to OT session starting with shower for familiar ADL retraining. She was fully oriented. She required mod cueing to stand from w/c and benefited from tactile cue paired with verbal. Ambulatory transfer into the bathroom with mod A. She stated she did not need to use the bathroom however OT encouraged attempt and she was able to have continent urine void, as well as had small incontinent BM in brief. She transferred into shower following. Poor initiation of bathing tasks but with mod cueing she was able to wash her hair with (S) and UB with mod A. L inattention limiting as well as poor awareness of deficits as she sat instead of asking for help with bathing she couldn't reach. She stood with min A using grab bar and OT provided peri cleansing. She transferred back to w/c following. Attempted reinforcement of hemi dressing techniques, pt unable to sequence or process command. She required max A to don a shirt. Max A to don pants. She does attempt to initiate dressing tasks and will participate but makes no effort toward L inclusion. Socks and shoes donned with total A. Pt was taken via w/c to the therapy gym for time management. She completed 160 ft of functional  mobility with HHA to manage flaccid LUE, min A- completed to challenge dynamic standing balance and functional activity tolerance. She transferred to the [employer] for BUE/LE coordination and reciprocal training to promote improved LUE/LE NMR. Total HOH provided for the LUE to integrate into task. MAX cueing required for attention and repetition of task. 5 min completed. She returned to the w/c and to her room. Pt was left sitting up in the wheelchair with all needs met, chair alarm set, and call bell within reach.    Therapy Documentation Precautions:  Precautions Precautions: Fall Recall of Precautions/Restrictions: Impaired Precaution/Restrictions Comments: L hemiplegia, h/o dementia Restrictions Weight Bearing Restrictions Per Provider Order: No  Therapy/Group: Individual Therapy  Nena VEAR Moats 07/20/2023, 8:28 AM

## 2023-07-20 NOTE — Progress Notes (Signed)
 PROGRESS NOTE   Subjective/Complaints:  Pt doing fine, but per nursing she's had multiple BMs the last couple days.  Slept ok. Denies pain. Urinating fine. No other complaints or concerns.   ROS- neg CP, SOB, abd pain, bloody stools, n/v  Objective:   No results found. Recent Labs    07/18/23 0607  WBC 8.7  HGB 13.4  HCT 41.9  PLT 227   No results for input(s): NA, K, CL, CO2, GLUCOSE, BUN, CREATININE, CALCIUM  in the last 72 hours.   Intake/Output Summary (Last 24 hours) at 07/20/2023 1149 Last data filed at 07/20/2023 0820 Gross per 24 hour  Intake 290 ml  Output --  Net 290 ml        Physical Exam: Vital Signs Blood pressure 118/81, pulse 85, temperature 98.6 F (37 C), resp. rate 18, height 5' 3 (1.6 m), weight 54.2 kg, SpO2 98%.  General: No acute distress, sitting up working with PT.  Mood and affect are flat Heart: Regular rate and rhythm no rubs murmurs or extra sounds Lungs: Clear to auscultation, breathing unlabored, no rales or wheezes Abdomen: Positive bowel sounds, soft nontender to palpation, nondistended Extremities: No clubbing, cyanosis, or edema Skin: No evidence of breakdown, no evidence of rash over exposed surfaces Neuro: L sided weakness.   PRIOR EXAMS: Neurologic: Cranial nerves II through XII intact, motor strength is 4/5 in Right aned 0/5 left deltoid, bicep, tricep, grip, hip flexor, knee extensors, ankle dorsiflexor and plantar flexor Sensory exam normal sensation to light touch and proprioception in bilateral upper and lower extremities Cerebellar exam normal finger to nose to finger as well as heel to shin in bilateral upper and lower extremities Musculoskeletal: Full range of motion in all 4 extremities. No joint swelling    Assessment/Plan: 1. Functional deficits which require 3+ hours per day of interdisciplinary therapy in a comprehensive inpatient rehab  setting. Physiatrist is providing close team supervision and 24 hour management of active medical problems listed below. Physiatrist and rehab team continue to assess barriers to discharge/monitor patient progress toward functional and medical goals  Care Tool:  Bathing    Body parts bathed by patient: Chest, Face, Right upper leg, Left upper leg, Abdomen, Front perineal area, Buttocks   Body parts bathed by helper: Right arm, Left arm, Left lower leg, Right lower leg     Bathing assist Assist Level: Moderate Assistance - Patient 50 - 74%     Upper Body Dressing/Undressing Upper body dressing   What is the patient wearing?: Pull over shirt    Upper body assist Assist Level: Moderate Assistance - Patient 50 - 74%    Lower Body Dressing/Undressing Lower body dressing      What is the patient wearing?: Pants, Incontinence brief     Lower body assist Assist for lower body dressing: Total Assistance - Patient < 25%     Toileting Toileting Toileting Activity did not occur Press photographer and hygiene only): N/A (no void or bm)  Toileting assist Assist for toileting: Maximal Assistance - Patient 25 - 49%     Transfers Chair/bed transfer  Transfers assist  Chair/bed transfer activity did not occur: Safety/medical concerns  Chair/bed  transfer assist level: Minimal Assistance - Patient > 75%     Locomotion Ambulation   Ambulation assist      Assist level: Minimal Assistance - Patient > 75% Assistive device: Hand held assist Max distance: 60 feet   Walk 10 feet activity   Assist     Assist level: Minimal Assistance - Patient > 75% Assistive device: Hand held assist   Walk 50 feet activity   Assist    Assist level: Minimal Assistance - Patient > 75% Assistive device: Hand held assist    Walk 150 feet activity   Assist Walk 150 feet activity did not occur: Safety/medical concerns         Walk 10 feet on uneven surface  activity   Assist  Walk 10 feet on uneven surfaces activity did not occur: Safety/medical concerns         Wheelchair     Assist Is the patient using a wheelchair?: Yes Type of Wheelchair: Manual    Wheelchair assist level: Dependent - Patient 0%      Wheelchair 50 feet with 2 turns activity    Assist        Assist Level: Dependent - Patient 0%   Wheelchair 150 feet activity     Assist      Assist Level: Dependent - Patient 0%   Blood pressure 118/81, pulse 85, temperature 98.6 F (37 C), resp. rate 18, height 5' 3 (1.6 m), weight 54.2 kg, SpO2 98%.  Medical Problem List and Plan: 1. 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  Antithrombotics: -DVT/anticoagulation:  Pharmaceutical: Lovenox  40mg  daily             -antiplatelet therapy: DAPT X 3 months followed by ASA alone.  3. Pain Management: tylenol  prn 4. Mood/Behavior/Sleep: LCSW to follow for evaluation and support.              --does get up 3-4 times every night (food/tobacco)             -antipsychotic agents: N/a 5. Neuropsych/cognition: This patient is not capable of making decisions on her own behalf. 6. Skin/Wound Care: Routine pressure relief measures.  7. Fluids/Electrolytes/Nutrition: Monitor I/O. Family reports great appetite-->eats all day long 8.  T2DM: Hgb A1c- 12.3. Monitor BS ac/hs and use SSI for elevated BS             --Was on 70/30 insulin  6 units bid and metformin  1000 mg bid  -Will restart 70/30 insulin  at 6mg  bid on 7/10, improved CBG this am  but elevated yesterday PM, increase to 8U -07/20/23 CBGs better, monitor. Of note, on Metformin  250mg  BID currently       CBG (last 3)  Recent Labs    07/19/23 1627 07/19/23 2042 07/20/23 0538  GLUCAP 121* 119* 126*   9. Dysphagia: On D1, thin liquids. -- upgraded to  DYS 2  --Assistance with feeding due to impulsivity and for aspiration precautions.  -continue supplements 10. H/o bipolar d/o: Used to have manic episodes/wander, etc but stable off medications.   11. Hyperlipidemia: Continue increased dose lipitor 40mg  daily  12. Dementia: On Aricept  5mg  nightly (followed by Dr. Georjean in the past) -per daughters pt was generally  independent within the home, needing  supervision with bathing and some ADL's for safety.  13. COPD/Tobacco use: Chews 4 packs of tobacco daily.    -if having cravings, might be useful to use patches while here-- monitor 14. Blood pressure: Monitor TID--keep SBP > 110. --avoid hypotension given R-ICA occlusion.  May d/c IVF after current bag -07/20/23 BPs better, monitor Vitals:   07/17/23 2025 07/18/23 0625 07/18/23 1402 07/18/23 1725  BP: 106/83 106/61 (!) 93/50 106/69   07/18/23 1955 07/19/23 0534 07/19/23 1740 07/19/23 1745  BP: 126/75 (!) 145/83 (!) 86/59 (!) 90/58   07/19/23 1853 07/19/23 1945 07/20/23 0243 07/20/23 0905  BP: 106/77 101/88 128/72 118/81    15. Mild leucocytosis: WBC up to 10.4 but no signs of fever or infection. Improved 7/10    Latest Ref Rng & Units 07/18/2023    6:07 AM 07/17/2023    6:00 AM 07/16/2023    5:05 AM  CBC  WBC 4.0 - 10.5 K/uL 8.7  9.7  10.4   Hemoglobin 12.0 - 15.0 g/dL 86.5  87.9  87.9   Hematocrit 36.0 - 46.0 % 41.9  38.2  38.7   Platelets 150 - 400 K/uL 227  237  208     16. Mild hyponatremia: Na-130 @ admission improved but still borderline low, on .9NS recheck 7/15     Latest Ref Rng & Units 07/17/2023    6:00 AM 07/16/2023    6:25 PM 07/16/2023    5:05 AM  BMP  Glucose 70 - 99 mg/dL 779  789  875   BUN 8 - 23 mg/dL 19  26  17    Creatinine 0.44 - 1.00 mg/dL 9.34  9.20  9.34   Sodium 135 - 145 mmol/L 134  132  137   Potassium 3.5 - 5.1 mmol/L 4.6  4.5  4.3   Chloride 98 - 111 mmol/L 101  96  101   CO2 22 - 32 mmol/L 23  28  22    Calcium  8.9 - 10.3 mg/dL 9.3  9.3  9.3     17.  Diarrhea:  -07/20/23 pt having multiple episodes of diarrhea over last few days, imodium  added, labs for tomorrow.  -unclear etiology, doubt c.diff but consider checking if continued/symptoms change -could be from nicotine  withdrawal? Or if metformin  was just recently added back?      LOS: 4 days A FACE TO FACE EVALUATION WAS PERFORMED  [address] Kathryn Mcguire 07/20/2023, 11:49 AM

## 2023-07-21 DIAGNOSIS — I63511 Cerebral infarction due to unspecified occlusion or stenosis of right middle cerebral artery: Secondary | ICD-10-CM | POA: Diagnosis not present

## 2023-07-21 DIAGNOSIS — I10 Essential (primary) hypertension: Secondary | ICD-10-CM | POA: Diagnosis not present

## 2023-07-21 DIAGNOSIS — R739 Hyperglycemia, unspecified: Secondary | ICD-10-CM | POA: Diagnosis not present

## 2023-07-21 LAB — CBC WITH DIFFERENTIAL/PLATELET
Abs Immature Granulocytes: 0.05 K/uL (ref 0.00–0.07)
Basophils Absolute: 0.1 K/uL (ref 0.0–0.1)
Basophils Relative: 1 %
Eosinophils Absolute: 0.1 K/uL (ref 0.0–0.5)
Eosinophils Relative: 1 %
HCT: 39.7 % (ref 36.0–46.0)
Hemoglobin: 12.8 g/dL (ref 12.0–15.0)
Immature Granulocytes: 1 %
Lymphocytes Relative: 31 %
Lymphs Abs: 2.5 K/uL (ref 0.7–4.0)
MCH: 26.1 pg (ref 26.0–34.0)
MCHC: 32.2 g/dL (ref 30.0–36.0)
MCV: 81 fL (ref 80.0–100.0)
Monocytes Absolute: 0.6 K/uL (ref 0.1–1.0)
Monocytes Relative: 8 %
Neutro Abs: 4.7 K/uL (ref 1.7–7.7)
Neutrophils Relative %: 58 %
Platelets: 266 K/uL (ref 150–400)
RBC: 4.9 MIL/uL (ref 3.87–5.11)
RDW: 13.5 % (ref 11.5–15.5)
WBC: 8.1 K/uL (ref 4.0–10.5)
nRBC: 0 % (ref 0.0–0.2)

## 2023-07-21 LAB — BASIC METABOLIC PANEL WITH GFR
Anion gap: 11 (ref 5–15)
BUN: 14 mg/dL (ref 8–23)
CO2: 21 mmol/L — ABNORMAL LOW (ref 22–32)
Calcium: 9.1 mg/dL (ref 8.9–10.3)
Chloride: 103 mmol/L (ref 98–111)
Creatinine, Ser: 0.66 mg/dL (ref 0.44–1.00)
GFR, Estimated: >60 mL/min (ref >60)
Glucose, Bld: 112 mg/dL — ABNORMAL HIGH (ref 70–99)
Potassium: 3.9 mmol/L (ref 3.5–5.1)
Sodium: 135 mmol/L (ref 135–145)

## 2023-07-21 LAB — GLUCOSE, CAPILLARY
Glucose-Capillary: 116 mg/dL — ABNORMAL HIGH (ref 70–99)
Glucose-Capillary: 189 mg/dL — ABNORMAL HIGH (ref 70–99)
Glucose-Capillary: 183 mg/dL — ABNORMAL HIGH (ref 70–99)
Glucose-Capillary: 149 mg/dL — ABNORMAL HIGH (ref 70–99)

## 2023-07-21 NOTE — Progress Notes (Signed)
 PROGRESS NOTE   Subjective/Complaints:  Pt doing fine, slept well. Denies pain. Urinating fine. LBM yesterday but since the dose of imodium  she hasn't had any further BMs/diarrhea. Denies nicotine  cravings. No other complaints or concerns.   ROS- neg CP, SOB, abd pain, bloody stools, n/v  Objective:   No results found. Recent Labs    07/21/23 0724  WBC 8.1  HGB 12.8  HCT 39.7  PLT 266   Recent Labs    07/21/23 0724  NA 135  K 3.9  CL 103  CO2 21*  GLUCOSE 112*  BUN 14  CREATININE 0.66  CALCIUM  9.1     Intake/Output Summary (Last 24 hours) at 07/21/2023 1049 Last data filed at 07/21/2023 0757 Gross per 24 hour  Intake 354 ml  Output --  Net 354 ml        Physical Exam: Vital Signs Blood pressure 124/66, pulse 72, temperature 98.2 F (36.8 C), temperature source Oral, resp. rate 18, height 5' 3 (1.6 m), weight 54.2 kg, SpO2 98%.  General: No acute distress, sitting up in bed. Mood and affect are flat Heart: Regular rate and rhythm no rubs murmurs or extra sounds Lungs: Clear to auscultation, breathing unlabored, no rales or wheezes Abdomen: Positive bowel sounds, soft nontender to palpation, nondistended Extremities: No clubbing, cyanosis, or edema Skin: No evidence of breakdown, no evidence of rash over exposed surfaces Neuro: L sided weakness.   PRIOR EXAMS: Neurologic: Cranial nerves II through XII intact, motor strength is 4/5 in Right aned 0/5 left deltoid, bicep, tricep, grip, hip flexor, knee extensors, ankle dorsiflexor and plantar flexor Sensory exam normal sensation to light touch and proprioception in bilateral upper and lower extremities Cerebellar exam normal finger to nose to finger as well as heel to shin in bilateral upper and lower extremities Musculoskeletal: Full range of motion in all 4 extremities. No joint swelling    Assessment/Plan: 1. Functional deficits which require 3+  hours per day of interdisciplinary therapy in a comprehensive inpatient rehab setting. Physiatrist is providing close team supervision and 24 hour management of active medical problems listed below. Physiatrist and rehab team continue to assess barriers to discharge/monitor patient progress toward functional and medical goals  Care Tool:  Bathing    Body parts bathed by patient: Chest, Face, Right upper leg, Left upper leg, Abdomen, Front perineal area, Buttocks   Body parts bathed by helper: Right arm, Left arm, Left lower leg, Right lower leg     Bathing assist Assist Level: Moderate Assistance - Patient 50 - 74%     Upper Body Dressing/Undressing Upper body dressing   What is the patient wearing?: Pull over shirt    Upper body assist Assist Level: Moderate Assistance - Patient 50 - 74%    Lower Body Dressing/Undressing Lower body dressing      What is the patient wearing?: Pants, Incontinence brief     Lower body assist Assist for lower body dressing: Total Assistance - Patient < 25%     Toileting Toileting Toileting Activity did not occur (Clothing management and hygiene only): N/A (no void or bm)  Toileting assist Assist for toileting: Maximal Assistance - Patient 25 -  49%     Transfers Chair/bed transfer  Transfers assist  Chair/bed transfer activity did not occur: Safety/medical concerns  Chair/bed transfer assist level: Minimal Assistance - Patient > 75%     Locomotion Ambulation   Ambulation assist      Assist level: Minimal Assistance - Patient > 75% Assistive device: Hand held assist Max distance: 60 feet   Walk 10 feet activity   Assist     Assist level: Minimal Assistance - Patient > 75% Assistive device: Hand held assist   Walk 50 feet activity   Assist    Assist level: Minimal Assistance - Patient > 75% Assistive device: Hand held assist    Walk 150 feet activity   Assist Walk 150 feet activity did not occur:  Safety/medical concerns         Walk 10 feet on uneven surface  activity   Assist Walk 10 feet on uneven surfaces activity did not occur: Safety/medical concerns         Wheelchair     Assist Is the patient using a wheelchair?: Yes Type of Wheelchair: Manual    Wheelchair assist level: Dependent - Patient 0%      Wheelchair 50 feet with 2 turns activity    Assist        Assist Level: Dependent - Patient 0%   Wheelchair 150 feet activity     Assist      Assist Level: Dependent - Patient 0%   Blood pressure 124/66, pulse 72, temperature 98.2 F (36.8 C), temperature source Oral, resp. rate 18, height 5' 3 (1.6 m), weight 54.2 kg, SpO2 98%.  Medical Problem List and Plan: 1. 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  Antithrombotics: -DVT/anticoagulation:  Pharmaceutical: Lovenox  40mg  daily             -antiplatelet therapy: DAPT X 3 months followed by ASA alone.  3. Pain Management: tylenol  prn 4. Mood/Behavior/Sleep: LCSW to follow for evaluation and support.              --does get up 3-4 times every night (food/tobacco)             -antipsychotic agents: N/a 5. Neuropsych/cognition: This patient is not capable of making decisions on her own behalf. 6. Skin/Wound Care: Routine pressure relief measures.  7. Fluids/Electrolytes/Nutrition: Monitor I/O. Family reports great appetite-->eats all day long 8.  T2DM: Hgb A1c- 12.3. Monitor BS ac/hs and use SSI for elevated BS             --Was on 70/30 insulin  6 units bid and metformin  1000 mg bid  -Will restart 70/30 insulin  at 6mg  bid on 7/10, improved CBG this am  but elevated yesterday PM, increase to 8U -7/12-13/25 CBGs better, monitor. Of note, on Metformin  250mg  BID currently       CBG (last 3)  Recent Labs     07/20/23 1656 07/20/23 2034 07/21/23 0544  GLUCAP 136* 84 116*   9. Dysphagia: On D1, thin liquids. -- upgraded to DYS 2  --Assistance with feeding due to impulsivity and for aspiration precautions.  -continue supplements  10. H/o bipolar d/o: Used to have manic episodes/wander, etc but stable off medications.   11.  Hyperlipidemia: Continue increased dose lipitor 40mg  daily  12. Dementia: On Aricept  5mg  nightly (followed by Dr. Georjean in the past) -per daughters pt was generally independent within the home, needing  supervision with bathing and some ADL's for safety.   13. COPD/Tobacco use: Chews 4 packs of tobacco daily.    -if having cravings, might be useful to use patches while here-- monitor  14. Blood pressure: Monitor TID--keep SBP > 110. --avoid hypotension given R-ICA occlusion.  May d/c IVF after current bag -07/21/23 BPs better mostly, had a few lower BPs but this morning better. Monitor closely.  Vitals:   07/18/23 1725 07/18/23 1955 07/19/23 0534 07/19/23 1740  BP: 106/69 126/75 (!) 145/83 (!) 86/59   07/19/23 1745 07/19/23 1853 07/19/23 1945 07/20/23 0243  BP: (!) 90/58 106/77 101/88 128/72   07/20/23 0905 07/20/23 1249 07/20/23 1928 07/21/23 0541  BP: 118/81 (!) 109/97 99/64 124/66    15. Mild leucocytosis: WBC up to 10.4 but no signs of fever or infection. Improved 7/10  -07/21/23 WBC 8.1    Latest Ref Rng & Units 07/21/2023    7:24 AM 07/18/2023    6:07 AM 07/17/2023    6:00 AM  CBC  WBC 4.0 - 10.5 K/uL 8.1  8.7  9.7   Hemoglobin 12.0 - 15.0 g/dL 87.1  86.5  87.9   Hematocrit 36.0 - 46.0 % 39.7  41.9  38.2   Platelets 150 - 400 K/uL 266  227  237     16. Mild hyponatremia: Na-130 @ admission improved but still borderline low, on 0.9NS recheck 7/15 -07/21/23 Na 135     Latest Ref Rng & Units 07/21/2023    7:24 AM 07/17/2023    6:00 AM 07/16/2023    6:25 PM  BMP  Glucose 70 - 99 mg/dL 887  779  789   BUN 8 - 23 mg/dL 14  19  26    Creatinine 0.44 - 1.00 mg/dL  9.33  9.34  9.20   Sodium 135 - 145 mmol/L 135  134  132   Potassium 3.5 - 5.1 mmol/L 3.9  4.6  4.5   Chloride 98 - 111 mmol/L 103  101  96   CO2 22 - 32 mmol/L 21  23  28    Calcium  8.9 - 10.3 mg/dL 9.1  9.3  9.3     17. Diarrhea:  -07/20/23 pt having multiple episodes of diarrhea over last few days, imodium  added, labs for tomorrow.  -unclear etiology, doubt c.diff but consider checking if continued/symptoms change -could be from nicotine  withdrawal? Or if metformin  was just recently added back? -07/21/23 diarrhea stopped with one dose of imodium  yesterday; labs unremarkable. Monitor for now, but highly suspect nicotine  w/d vs if metformin  was recently restarted then that would be the most obvious cause.       LOS: 5 days A FACE TO FACE EVALUATION WAS PERFORMED  [id number] 07/21/2023, 10:49 AM

## 2023-07-22 DIAGNOSIS — I63511 Cerebral infarction due to unspecified occlusion or stenosis of right middle cerebral artery: Secondary | ICD-10-CM | POA: Diagnosis not present

## 2023-07-22 DIAGNOSIS — E1165 Type 2 diabetes mellitus with hyperglycemia: Secondary | ICD-10-CM | POA: Diagnosis not present

## 2023-07-22 DIAGNOSIS — R198 Other specified symptoms and signs involving the digestive system and abdomen: Secondary | ICD-10-CM

## 2023-07-22 DIAGNOSIS — E871 Hypo-osmolality and hyponatremia: Secondary | ICD-10-CM | POA: Diagnosis not present

## 2023-07-22 LAB — GLUCOSE, CAPILLARY
Glucose-Capillary: 112 mg/dL — ABNORMAL HIGH (ref 70–99)
Glucose-Capillary: 201 mg/dL — ABNORMAL HIGH (ref 70–99)
Glucose-Capillary: 123 mg/dL — ABNORMAL HIGH (ref 70–99)
Glucose-Capillary: 105 mg/dL — ABNORMAL HIGH (ref 70–99)

## 2023-07-22 LAB — CBC
HCT: 36.8 % (ref 36.0–46.0)
Hemoglobin: 11.9 g/dL — ABNORMAL LOW (ref 12.0–15.0)
MCH: 25.8 pg — ABNORMAL LOW (ref 26.0–34.0)
MCHC: 32.3 g/dL (ref 30.0–36.0)
MCV: 79.7 fL — ABNORMAL LOW (ref 80.0–100.0)
Platelets: 290 K/uL (ref 150–400)
RBC: 4.62 MIL/uL (ref 3.87–5.11)
RDW: 13.4 % (ref 11.5–15.5)
WBC: 9 K/uL (ref 4.0–10.5)
nRBC: 0 % (ref 0.0–0.2)

## 2023-07-22 MED ORDER — GLYBURIDE 2.5 MG PO TABS
2.5000 mg | ORAL_TABLET | Freq: Two times a day (BID) | ORAL | Status: DC
  Administered 2023-07-22 – 2023-07-23 (×2): 2.5 mg via ORAL
  Filled 2023-07-22 (×2): qty 1

## 2023-07-22 NOTE — Progress Notes (Signed)
 Speech Language Pathology Daily Session Note  Patient Details  Name: Kathryn Mcguire MRN: [id number] Date of Birth: 08-20-1950  Today's Date: 07/22/2023 SLP Individual Time: 0902-0958 SLP Individual Time Calculation (min): 56 min  Short Term Goals: Week 1: SLP Short Term Goal 1 (Week 1): Pt will tolerate trials of Dys2 solids with no overt s/sx of oral difficulty prior to formal diet upgrade given mod cues for safety awareness and use of compensatory strategies. SLP Short Term Goal 2 (Week 1): Pt will utilize external memory aids to interpret daily schedule and recall basic information re: rehab stay given max verbal cues. SLP Short Term Goal 3 (Week 1): Patient will attend to the left field during functional problem solving tasks in 4/5 opportunities given max multimodal cues. SLP Short Term Goal 4 (Week 1): Patient will demonstrate awareness of cognitive and physical impairments by stating 2 of each when given max multimodal cues. SLP Short Term Goal 5 (Week 1): Patient will orient to date utilizing external aids when given mod multimodal cues. SLP Short Term Goal 6 (Week 1): Patient will utilize SLOP speech intelligibility strategies at the phrase level in 80% of opportunities given max cues.  Skilled Therapeutic Interventions:  Patient was seen in am to address cognitive re- training and dysphagia management. Pt was alert upon SLP arrival. Pt indep oriented to temporal, spatial and situational concepts. SLP facilitated session through challenging pt in attention to left side. Given her therapy schedule pt identified therapy times, discplines, and therapist names given max A awareness improved given a visual anchor placed on left side of page though continued to benefit from mod A. SLP further facilitated L attention through a structured taks where cards were displayed across a tray table and pt was challenged to identify a matching card. She completerd task with mod to max A to attend to  left. SLP introduced D3 solids with pt demonstrating decreased awareness of left side as noted by anterior spillage to left. Given a mirror pt able to idenitfy anterior spillage and clear. During session, pt acknowledged changes in physical and cognitive function though unable to expound upon information when asked. Pt was left in bed with call button within reach, bed alarm active, and family member present. SLP to continue POC.  Pain Pain Assessment Pain Scale: 0-10 Pain Score: 0-No pain  Therapy/Group: Individual Therapy  Kathryn Mcguire 07/22/2023, 12:53 PM

## 2023-07-22 NOTE — Progress Notes (Signed)
 PROGRESS NOTE   Subjective/Complaints:  Pt sitting in bed. No new complaints. No pain. Slept well. Moving bowels  ROS: Patient denies fever, rash, sore throat, blurred vision, dizziness, nausea, vomiting, diarrhea, cough, shortness of breath or chest pain, joint or back/neck pain, headache, or mood change.   Objective:   No results found. Recent Labs    07/21/23 0724 07/22/23 0515  WBC 8.1 9.0  HGB 12.8 11.9*  HCT 39.7 36.8  PLT 266 290   Recent Labs    07/21/23 0724  NA 135  K 3.9  CL 103  CO2 21*  GLUCOSE 112*  BUN 14  CREATININE 0.66  CALCIUM  9.1     Intake/Output Summary (Last 24 hours) at 07/22/2023 1119 Last data filed at 07/22/2023 9167 Gross per 24 hour  Intake 558 ml  Output --  Net 558 ml        Physical Exam: Vital Signs Blood pressure 122/73, pulse 82, temperature 99.2 F (37.3 C), temperature source Oral, resp. rate 18, height 5' 3 (1.6 m), weight 54.1 kg, SpO2 99%.  Constitutional: No distress . Vital signs reviewed. HEENT: NCAT, EOMI, oral membranes moist Neck: supple Cardiovascular: RRR without murmur. No JVD    Respiratory/Chest: CTA Bilaterally without wheezes or rales. Normal effort    GI/Abdomen: BS +, non-tender, non-distended Ext: no clubbing, cyanosis, or edema Psych: pleasant and cooperative  Neurologic: Cranial nerves II through XII intact, motor strength is 4/5 in Right aned 0/5 left deltoid, bicep, tricep, grip, hip flexor, knee extensors, ankle dorsiflexor and plantar flexor--does exhibit some automatic movement of left leg Sensory exam normal sensation to light touch and proprioception in bilateral upper and lower extremities Cerebellar exam normal finger to nose to finger as well as heel to shin in bilateral upper and lower extremities Musculoskeletal: Full range of motion in all 4 extremities. No joint swelling    Assessment/Plan: 1. Functional deficits which  require 3+ hours per day of interdisciplinary therapy in a comprehensive inpatient rehab setting. Physiatrist is providing close team supervision and 24 hour management of active medical problems listed below. Physiatrist and rehab team continue to assess barriers to discharge/monitor patient progress toward functional and medical goals  Care Tool:  Bathing    Body parts bathed by patient: Chest, Face, Right upper leg, Left upper leg, Abdomen, Front perineal area, Buttocks   Body parts bathed by helper: Right arm, Left arm, Left lower leg, Right lower leg     Bathing assist Assist Level: Moderate Assistance - Patient 50 - 74%     Upper Body Dressing/Undressing Upper body dressing   What is the patient wearing?: Pull over shirt    Upper body assist Assist Level: Moderate Assistance - Patient 50 - 74%    Lower Body Dressing/Undressing Lower body dressing      What is the patient wearing?: Pants, Incontinence brief     Lower body assist Assist for lower body dressing: Total Assistance - Patient < 25%     Toileting Toileting Toileting Activity did not occur Press photographer and hygiene only): N/A (no void or bm)  Toileting assist Assist for toileting: Maximal Assistance - Patient 25 - 49%  Transfers Chair/bed transfer  Transfers assist  Chair/bed transfer activity did not occur: Safety/medical concerns  Chair/bed transfer assist level: Minimal Assistance - Patient > 75%     Locomotion Ambulation   Ambulation assist      Assist level: Minimal Assistance - Patient > 75% Assistive device: Hand held assist Max distance: 60 feet   Walk 10 feet activity   Assist     Assist level: Minimal Assistance - Patient > 75% Assistive device: Hand held assist   Walk 50 feet activity   Assist    Assist level: Minimal Assistance - Patient > 75% Assistive device: Hand held assist    Walk 150 feet activity   Assist Walk 150 feet activity did not occur:  Safety/medical concerns         Walk 10 feet on uneven surface  activity   Assist Walk 10 feet on uneven surfaces activity did not occur: Safety/medical concerns         Wheelchair     Assist Is the patient using a wheelchair?: Yes Type of Wheelchair: Manual    Wheelchair assist level: Dependent - Patient 0%      Wheelchair 50 feet with 2 turns activity    Assist        Assist Level: Dependent - Patient 0%   Wheelchair 150 feet activity     Assist      Assist Level: Dependent - Patient 0%   Blood pressure 122/73, pulse 82, temperature 99.2 F (37.3 C), temperature source Oral, resp. rate 18, height 5' 3 (1.6 m), weight 54.1 kg, SpO2 99%.  Medical Problem List and Plan: 1. 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  Antithrombotics: -DVT/anticoagulation:  Pharmaceutical: Lovenox  40mg  daily             -antiplatelet therapy: DAPT X 3 months followed by ASA alone.  3. Pain Management: tylenol  prn 4. Mood/Behavior/Sleep: LCSW to follow for evaluation and support.              --does get up 3-4 times every night (food/tobacco)             -antipsychotic agents: N/a 5. Neuropsych/cognition: This patient is not capable of making decisions on her own behalf. 6. Skin/Wound Care: Routine pressure relief measures.  7. Fluids/Electrolytes/Nutrition: Monitor I/O. Family reports great appetite-->eats all day long. Eating 100% 8.  T2DM: Hgb A1c- 12.3. Monitor BS ac/hs and use SSI for elevated BS             --Was on 70/30 insulin  6 units bid and metformin  1000 mg bid  -Will restart 70/30 insulin  at 6mg  bid on 7/10, improved CBG this am  but elevated yesterday PM, increase to 8U -7/12-14/25 CBGs better  -will add glyburide  and dc metformin   given recent loose stool         CBG (last 3)  Recent Labs    07/21/23 2046 07/22/23 0534 07/22/23 1110  GLUCAP 149* 112* 201*   9. Dysphagia: On D1, thin liquids. -- upgraded to DYS 2  --Assistance with feeding due to impulsivity and for aspiration precautions.  -continue supplements  10. H/o bipolar d/o: Used to have manic  episodes/wander, etc but stable off medications.   11. Hyperlipidemia: Continue increased dose lipitor 40mg  daily  12. Dementia: On Aricept  5mg  nightly (followed by Dr. Georjean in the past) -per daughters pt was generally independent within the home, needing  supervision with bathing and some ADL's for safety.   13. COPD/Tobacco use: Chews 4 packs of tobacco daily.    -if having cravings, might be useful to use patches while here-- monitor  14. Blood pressure: Monitor TID--keep SBP > 110. --avoid hypotension given R-ICA occlusion.  May d/c IVF after current bag -7/14 bp's soft--seems to be tolerating---obsv with therapy today.  Vitals:   07/19/23 1740 07/19/23 1745 07/19/23 1853 07/19/23 1945  BP: (!) 86/59 (!) 90/58 106/77 101/88   07/20/23 0243 07/20/23 0905 07/20/23 1249 07/20/23 1928  BP: 128/72 118/81 (!) 109/97 99/64   07/21/23 0541 07/21/23 1329 07/21/23 1947 07/22/23 0248  BP: 124/66 (!) 109/58 123/69 122/73    15. Mild leucocytosis: WBC up to 10.4 but no signs of fever or infection. Improved 7/10  -07/22/23 WBC as below    Latest Ref Rng & Units 07/22/2023    5:15 AM 07/21/2023    7:24 AM 07/18/2023    6:07 AM  CBC  WBC 4.0 - 10.5 K/uL 9.0  8.1  8.7   Hemoglobin 12.0 - 15.0 g/dL 88.0  87.1  86.5   Hematocrit 36.0 - 46.0 % 36.8  39.7  41.9   Platelets 150 - 400 K/uL 290  266  227     16. Mild hyponatremia: Na-130 @ admission improved but still borderline low, on 0.9NS recheck 7/15 -07/21/23 Na 135     Latest Ref Rng & Units 07/21/2023    7:24 AM 07/17/2023    6:00 AM 07/16/2023    6:25 PM  BMP  Glucose 70 - 99 mg/dL 887  779  789   BUN 8 - 23  mg/dL 14  19  26    Creatinine 0.44 - 1.00 mg/dL 9.33  9.34  9.20   Sodium 135 - 145 mmol/L 135  134  132   Potassium 3.5 - 5.1 mmol/L 3.9  4.6  4.5   Chloride 98 - 111 mmol/L 103  101  96   CO2 22 - 32 mmol/L 21  23  28    Calcium  8.9 - 10.3 mg/dL 9.1  9.3  9.3     17. Diarrhea:  -07/20/23 pt having multiple episodes of diarrhea over last few days, imodium  added, labs for tomorrow.  -unclear etiology, doubt c.diff but consider checking if continued/symptoms change -could be from nicotine  withdrawal? Or if metformin  was just recently added back? -7/13-14/25 diarrhea stopped with one dose of imodium  7/12 -labs unremarkable.  -stopped metformin  just to be on safe side     LOS: 6 days A FACE TO FACE EVALUATION WAS PERFORMED  Arthea ONEIDA Gunther 07/22/2023, 11:19 AM

## 2023-07-22 NOTE — Progress Notes (Signed)
 Occupational Therapy Session Note  Patient Details  Name: Kathryn Mcguire MRN: [id number] Date of Birth: 09-14-1950  Today's Date: 07/22/2023 Session 1:  OT Individual Time: 8896-8841 OT Individual Time Calculation (min): 55 min  Session 2: OT Individual Time: 8551-8484 OT Individual Time Calculation (min): 27 min   Short Term Goals: Week 1:  OT Short Term Goal 1 (Week 1): Patient will trasnfer to toilet with mod assist OT Short Term Goal 2 (Week 1): Patient will complete clothing management during toileting wiht mod assist OT Short Term Goal 3 (Week 1): Patient will don pull over shirt with min assist following set up OT Short Term Goal 4 (Week 1): Patient will visually locate needed ADL items left of midline 5* with min cueing  Skilled Therapeutic Interventions/Progress Updates:     AM Session:  Pt received semi-reclined in bed with grand DTR present in room upon OT arrival. Pt presenting to be in good spirits receptive to skilled OT session reporting -/10 pain- OT offering intermittent rest breaks, repositioning, and therapeutic support to optimize participation in therapy session. Pt's brief noted to be soiled with Pt receptive to getting cleaned up at beginning of session. Focused this session on functional cognition, L NMRE, and ADL retraining.   Pt was able to transition to EOB with HOB elevated with CGA. Instructed Pt to doff socks with Pt able to doff R sock with increased time, however max multimodal cues required to initiate doffing L sock and max A overall d/t motor planning challenges. She stood with min A and maintained standing balance with min HHA while OT doffed dirty brief total A. Pt able to complete anterior/posterior peri-care following verbal cues for task initiation. Clean brief donned total A for time management. Increased challenges motor planning hemi-technique for doffing shirt- she does initiate reaching overhead with R UE, however she does not continue task  requiring max A to doff. When presented with L shirt sleeve, she was able to utilize R UE to weave L UE with min HOH A and verbal cues. She then weave R UE and required max A to bring shirt over head. Assistance required for weaving L LE with Pt then able to initiate weaving R LE d/t tight fit of pants. She stood with min A and initiated bringing pants to waist with assistance required to bring pants over L hip. During ADLs, Pt tends to sit quickly vs ask for assistance or continue to task.   Pt completed short distance functional mobility to wc with min HHA. Transported Pt total A to therapy gym in wc for time management.   While seated at table top, engaged Pt in completing table glides with L UE positioned on table top moving through scapular protraction/retraction, elbow flexion extension, and shoulder adduction/abduction. Incorporated R UE in providing HOH A to guide movement and OT assisting for pacing and improved ROM- decreased tone noted in L bicep and pectoralis muscles following AAROM.  While seated EOM, Pt participated in L visual scanning and functional reaching activity to work on L side attention, crossing midline, and dynamic sitting balance. Positioned large vertical mirror anterior to Pt for visual feedback with squigz attached to lower half of mirror- Pt instructed to utilize R UE to remove squigz and then place them in container on her L side. She was able to complete task with CGA +increased time and mod-max multimodal cues to locate squigz in L visual field. She has a R gaxe preference, however when using mirror she  is able to visually track slightly to the L past midline. Graded up task to incorporate standing with Pt requiring same amount of cueing, min HHA for standing balance.   Pt transported back to room total A in wc. Pt was left resting in wc with call bell in reach, seatbelt alarm on, and all needs met.    PM Session:  Pt received siting up in wc, declining need for ADLs  this session. Pt presenting with flat affect, receptive to skilled OT session reporting 0/10 pain- OT offering intermittent rest breaks, repositioning, and therapeutic support to optimize participation in therapy session. Focused this session on functional cognition, functional mobility training, and L side attention. Transported Pt total A to ADL apartment in wc for time management and energy conservation. Engaged Pt in completing series of familiar functional IADL based activities to work on task initiation, L side attention, and focused attention. Placed cups, plates, bowls, and silverware across counter top and request Pt assist in cleaning up the dishes with Pt receptive. She did initiate walking towards dishes and opening cabinets to locate correct shelves for items with min questioning cue. When sorting out larger items, mod multimodal cues required to locate items in L visual field. Once Pt located items when was able to correctly sort them. When sorting silverware, min questioning cues required to continue task and to correct single mistake. Seated rest breaks provided x2 during activities. Pt fatigued at end of session requesting to return to bed. Transported Pt back to room total A in wc. She completed short distance functional mobility to bed with min HHA and returned to bed with CGA. Pt was left resting in bed with call bell in reach, bed alarm on, and all needs met.     Documentation Precautions:  Precautions Precautions: Fall Recall of Precautions/Restrictions: Impaired Precaution/Restrictions Comments: L hemiplegia, h/o dementia Restrictions Weight Bearing Restrictions Per Provider Order: No   Therapy/Group: Individual Therapy  Kathryn Mcguire 07/22/2023, 7:54 AM

## 2023-07-22 NOTE — Progress Notes (Signed)
 Physical Therapy Session Note  Patient Details  Name: Kathryn Mcguire MRN: [id number] Date of Birth: August 25, 1950  Today's Date: 07/22/2023 PT Individual Time: 1330-1430 PT Individual Time Calculation (min): 60 min   Short Term Goals: Week 1:  PT Short Term Goal 1 (Week 1): Pt will ambulate 100 with hand held support PT Short Term Goal 2 (Week 1): Pt will perform 5STS outcmoe measure PT Short Term Goal 3 (Week 1): Pt will perform sit to sidelying with minA PT Short Term Goal 4 (Week 1): Pt will perform car transfer with modA  Skilled Therapeutic Interventions/Progress Updates:    Pt up in w/c when PT arrived, grand daughter present.  Pt agreeable for PT.  Pt wheeled to day gym.  Initiated gait tx with HHA, due to limited space, moved to main gym.  Pt performed standing balance, endurance tx, gait tx.   Therapy Documentation Precautions:  Precautions Precautions: Fall Recall of Precautions/Restrictions: Impaired Precaution/Restrictions Comments: L hemiplegia, h/o dementia Restrictions Weight Bearing Restrictions Per Provider Order: No General:   Vital Signs: Therapy Vitals Temp: 98.2 F (36.8 C) Pulse Rate: 81 Resp: 17 BP: 115/68 Patient Position (if appropriate): Sitting Oxygen Therapy SpO2: 99 % O2 Device: Room Air Pain: Pain Assessment Pain Scale: 0-10 Pain Score: 0-No pain     Therapy/Group: Individual Therapy  Arland GORMAN Fast 07/22/2023, 4:33 PM

## 2023-07-23 DIAGNOSIS — E1165 Type 2 diabetes mellitus with hyperglycemia: Secondary | ICD-10-CM | POA: Diagnosis not present

## 2023-07-23 DIAGNOSIS — R198 Other specified symptoms and signs involving the digestive system and abdomen: Secondary | ICD-10-CM | POA: Diagnosis not present

## 2023-07-23 DIAGNOSIS — E871 Hypo-osmolality and hyponatremia: Secondary | ICD-10-CM | POA: Diagnosis not present

## 2023-07-23 DIAGNOSIS — I63511 Cerebral infarction due to unspecified occlusion or stenosis of right middle cerebral artery: Secondary | ICD-10-CM | POA: Diagnosis not present

## 2023-07-23 LAB — BASIC METABOLIC PANEL WITH GFR
Anion gap: 12 (ref 5–15)
BUN: 16 mg/dL (ref 8–23)
CO2: 25 mmol/L (ref 22–32)
Calcium: 9.9 mg/dL (ref 8.9–10.3)
Chloride: 98 mmol/L (ref 98–111)
Creatinine, Ser: 0.99 mg/dL (ref 0.44–1.00)
GFR, Estimated: >60 mL/min (ref >60)
Glucose, Bld: 161 mg/dL — ABNORMAL HIGH (ref 70–99)
Potassium: 4.3 mmol/L (ref 3.5–5.1)
Sodium: 135 mmol/L (ref 135–145)

## 2023-07-23 LAB — GLUCOSE, CAPILLARY
Glucose-Capillary: 46 mg/dL — ABNORMAL LOW (ref 70–99)
Glucose-Capillary: 71 mg/dL (ref 70–99)
Glucose-Capillary: 55 mg/dL — ABNORMAL LOW (ref 70–99)
Glucose-Capillary: 123 mg/dL — ABNORMAL HIGH (ref 70–99)
Glucose-Capillary: 182 mg/dL — ABNORMAL HIGH (ref 70–99)

## 2023-07-23 MED ORDER — GLYBURIDE 2.5 MG PO TABS
2.5000 mg | ORAL_TABLET | Freq: Every day | ORAL | Status: DC
  Administered 2023-07-24 – 2023-07-28 (×5): 2.5 mg via ORAL
  Filled 2023-07-23 (×6): qty 1

## 2023-07-23 NOTE — Progress Notes (Signed)
 Physical Therapy Session Note  Patient Details  Name: BARBAR BREDE MRN: [id number] Date of Birth: 1950-10-18  Today's Date: 07/23/2023 PT Individual Time: 8596-8554 PT Individual Time Calculation (min): 42 min   Short Term Goals: Week 1:  PT Short Term Goal 1 (Week 1): Pt will ambulate 100 with hand held support PT Short Term Goal 2 (Week 1): Pt will perform 5STS outcmoe measure PT Short Term Goal 3 (Week 1): Pt will perform sit to sidelying with minA PT Short Term Goal 4 (Week 1): Pt will perform car transfer with modA   Skilled Therapeutic Interventions/Progress Updates:    Pt presents up in w/c. Session focused on targeting L inattention, functional dynamic balance, cognitive remediation, and sit > stands. Used BITs balance system to focus on L scanning and L weightshift. Poor awareness of overall postural control impairments. Cues for attention to the L visual field on user paced program with bias to L sided targets mainly performed in seated position. Significant motor planning deficits for sit > stands or to maintain standing to complete task this PM or other table top task in standing once completed BITs activity. Max cues with min A when able to functionally perform  Therapy Documentation Precautions:  Precautions Precautions: Fall Recall of Precautions/Restrictions: Impaired Precaution/Restrictions Comments: L hemiplegia, h/o dementia Restrictions Weight Bearing Restrictions Per Provider Order: (P) No    Pain: Pain Assessment Pain Score: 0-No pain     Therapy/Group: Individual Therapy  Elnor Pizza Sherrell Pizza WENDI Elnor, PT, DPT, CBIS  07/23/2023, 2:50 PM

## 2023-07-23 NOTE — Plan of Care (Signed)
  Problem: Consults Goal: RH STROKE PATIENT EDUCATION Description: See Patient Education module for education specifics  Outcome: Progressing   Problem: RH SAFETY Goal: RH STG ADHERE TO SAFETY PRECAUTIONS W/ASSISTANCE/DEVICE Description: STG Adhere to Safety Precautions With supervision Assistance/Device. Outcome: Progressing

## 2023-07-23 NOTE — Progress Notes (Signed)
 PROGRESS NOTE   Subjective/Complaints:  Pt up in bed. Waiting to eat breakfast. No problems overnight.   ROS: Patient denies fever, rash, sore throat, blurred vision, dizziness, nausea, vomiting, diarrhea, cough, shortness of breath or chest pain, joint or back/neck pain, headache, or mood change.   Objective:   No results found. Recent Labs    07/21/23 0724 07/22/23 0515  WBC 8.1 9.0  HGB 12.8 11.9*  HCT 39.7 36.8  PLT 266 290   Recent Labs    07/21/23 0724  NA 135  K 3.9  CL 103  CO2 21*  GLUCOSE 112*  BUN 14  CREATININE 0.66  CALCIUM  9.1     Intake/Output Summary (Last 24 hours) at 07/23/2023 0933 Last data filed at 07/22/2023 1916 Gross per 24 hour  Intake 258 ml  Output --  Net 258 ml        Physical Exam: Vital Signs Blood pressure 131/65, pulse 74, temperature 98.1 F (36.7 C), temperature source Oral, resp. rate 18, height 5' 3 (1.6 m), weight 55.1 kg, SpO2 100%.  Constitutional: No distress . Vital signs reviewed. HEENT: NCAT, EOMI, oral membranes moist Neck: supple Cardiovascular: RRR without murmur. No JVD    Respiratory/Chest: CTA Bilaterally without wheezes or rales. Normal effort    GI/Abdomen: BS +, non-tender, non-distended Ext: no clubbing, cyanosis, or edema Psych: a little flat but pleasant and cooperative  Neurologic:Oriented to person, [facility], stroke. Cranial nerves II through XII intact, motor strength is 4/5 in Right hand. 0/5 left deltoid, bicep, tricep, grip. Did spontaneously move left hip flexors, knee extensors, ankle dorsiflexor and plantar flexors but was not consistent during MMT. She  exhibits early tone in LUE: tr pecs, 1 biceps, tr wrist flexors.  Sensory exam normal sensation to light touch and proprioception in bilateral upper and lower extremities Cerebellar exam normal finger to nose to finger as well as heel to shin in bilateral upper and lower  extremities Musculoskeletal: Full range of motion in all 4 extremities. No joint swelling    Assessment/Plan: 1. Functional deficits which require 3+ hours per day of interdisciplinary therapy in a comprehensive inpatient rehab setting. Physiatrist is providing close team supervision and 24 hour management of active medical problems listed below. Physiatrist and rehab team continue to assess barriers to discharge/monitor patient progress toward functional and medical goals  Care Tool:  Bathing    Body parts bathed by patient: Face, Chest, Front perineal area, Abdomen   Body parts bathed by helper: Right arm, Left arm, Right upper leg, Left upper leg, Right lower leg, Left lower leg, Buttocks     Bathing assist Assist Level: Maximal Assistance - Patient 24 - 49%     Upper Body Dressing/Undressing Upper body dressing   What is the patient wearing?: Bra, Pull over shirt    Upper body assist Assist Level: Maximal Assistance - Patient 25 - 49%    Lower Body Dressing/Undressing Lower body dressing      What is the patient wearing?: Pants, Underwear/pull up     Lower body assist Assist for lower body dressing: Total Assistance - Patient < 25%     Toileting Toileting Toileting Activity did not  occur Press photographer and hygiene only): N/A (no void or bm)  Toileting assist Assist for toileting: Maximal Assistance - Patient 25 - 49%     Transfers Chair/bed transfer  Transfers assist  Chair/bed transfer activity did not occur: Safety/medical concerns  Chair/bed transfer assist level: Minimal Assistance - Patient > 75%     Locomotion Ambulation   Ambulation assist      Assist level: Minimal Assistance - Patient > 75% Assistive device: Hand held assist Max distance: 100'   Walk 10 feet activity   Assist     Assist level: Contact Guard/Touching assist Assistive device: Hand held assist   Walk 50 feet activity   Assist    Assist level: Contact  Guard/Touching assist Assistive device: Hand held assist    Walk 150 feet activity   Assist Walk 150 feet activity did not occur: Safety/medical concerns         Walk 10 feet on uneven surface  activity   Assist Walk 10 feet on uneven surfaces activity did not occur: Safety/medical concerns         Wheelchair     Assist Is the patient using a wheelchair?: Yes Type of Wheelchair: Manual    Wheelchair assist level: Dependent - Patient 0%      Wheelchair 50 feet with 2 turns activity    Assist        Assist Level: Dependent - Patient 0%   Wheelchair 150 feet activity     Assist      Assist Level: Dependent - Patient 0%   Blood pressure 131/65, pulse 74, temperature 98.1 F (36.7 C), temperature source Oral, resp. rate 18, height 5' 3 (1.6 m), weight 55.1 kg, SpO2 100%.  Medical Problem List and Plan: 1. 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  Antithrombotics: -DVT/anticoagulation:  Pharmaceutical: Lovenox  40mg  daily             -antiplatelet therapy: DAPT X 3 months followed by ASA alone.  3. Pain Management/spasticity: tylenol  prn  -7/15 pt with early flexor tone of LUE. It is mild, and I think at this point, general ROM and positioning is adequate.    -will order WHO for LUE 4. Mood/Behavior/Sleep: LCSW to follow for evaluation and support.              --does get up 3-4 times every night (food/tobacco)             -antipsychotic agents: N/a 5. Neuropsych/cognition: This patient is not capable of making decisions on her own behalf. 6. Skin/Wound Care: Routine pressure relief measures.  7. Fluids/Electrolytes/Nutrition: Monitor I/O. Family reports great appetite-->eats all day long. Eating 100% 8.  T2DM: Hgb A1c- 12.3.  Monitor BS ac/hs and use SSI for elevated BS             --Was on 70/30 insulin  6 units bid and metformin  1000 mg bid  -Will restart 70/30 insulin  at 6mg  bid on 7/10, improved CBG this am  but elevated yesterday PM, increase to 8U -7/12-14/25 CBGs better  -will add glyburide  and dc metformin  given recent loose stool -7/15 CBG low this am. Will hold PM glyburide  for  now.          CBG (last 3)  Recent Labs    07/22/23 2104 07/23/23 0627 07/23/23 0641  GLUCAP 105* 46* 71   9. 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  -continue supplements  10. H/o bipolar d/o: Used to have manic episodes/wander, etc but stable off medications.   11. Hyperlipidemia: Continue increased dose lipitor 40mg  daily  12. Dementia: On Aricept  5mg  nightly (followed by Dr. Georjean in the past) -per daughters pt was generally independent within the home, needing  supervision with bathing and some ADL's for safety.   13. COPD/Tobacco use: Chews 4 packs of tobacco daily.    -if having cravings, might be useful to use patches while here-- monitor  14. Blood pressure: Monitor TID--keep SBP > 110. --avoid hypotension given R-ICA occlusion.  May d/c IVF after current bag -7/15 bp's soft--seems to be tolerating---no changes for now -push fluids.  Vitals:   07/19/23 1945 07/20/23 0243 07/20/23 0905 07/20/23 1249  BP: 101/88 128/72 118/81 (!) 109/97   07/20/23 1928 07/21/23 0541 07/21/23 1329 07/21/23 1947  BP: 99/64 124/66 (!) 109/58 123/69   07/22/23 0248 07/22/23 1437 07/22/23 1940 07/23/23 0620  BP: 122/73 115/68 114/64 131/65    15. Mild leucocytosis: WBC up to 10.4 but no signs of fever or infection. Improved 7/10  -07/22/23 WBC as below    Latest Ref Rng & Units 07/22/2023    5:15 AM 07/21/2023    7:24 AM 07/18/2023    6:07 AM  CBC  WBC 4.0 - 10.5 K/uL 9.0  8.1  8.7   Hemoglobin 12.0 - 15.0 g/dL 88.0  87.1  86.5   Hematocrit 36.0 - 46.0 %  36.8  39.7  41.9   Platelets 150 - 400 K/uL 290  266  227     16. Mild hyponatremia: Na-130 @ admission improved but still borderline low, on 0.9NS recheck 7/15 -07/21/23 Na 135     Latest Ref Rng & Units 07/21/2023    7:24 AM 07/17/2023    6:00 AM 07/16/2023    6:25 PM  BMP  Glucose 70 - 99 mg/dL 887  779  789   BUN 8 - 23 mg/dL 14  19  26    Creatinine 0.44 - 1.00 mg/dL 9.33  9.34  9.20   Sodium 135 - 145 mmol/L 135  134  132   Potassium 3.5 - 5.1 mmol/L 3.9  4.6  4.5   Chloride 98 - 111 mmol/L 103  101  96   CO2 22 - 32 mmol/L 21  23  28    Calcium  8.9 - 10.3 mg/dL 9.1  9.3  9.3     17. Diarrhea:  -07/20/23 pt having multiple episodes of diarrhea over last few days, imodium  added, labs for tomorrow.  -unclear etiology, doubt c.diff but consider checking if continued/symptoms change -could be from nicotine  withdrawal? Or if metformin  was just recently added back? -7/13-14/25 diarrhea stopped with one dose of imodium  7/12 -labs unremarkable.  -stopped metformin      LOS: 7 days A FACE TO FACE EVALUATION WAS PERFORMED  Arthea ONEIDA Gunther 07/23/2023, 9:33 AM

## 2023-07-23 NOTE — Progress Notes (Signed)
 Speech Language Pathology Daily Session Note  Patient Details  Name: Kathryn Mcguire MRN: [id number] Date of Birth: 1950/03/26  Today's Date: 07/23/2023 SLP Individual Time: 1002-1057 SLP Individual Time Calculation (min): 55 min  Short Term Goals: Week 1: SLP Short Term Goal 1 (Week 1): Pt will tolerate trials of Dys2 solids with no overt s/sx of oral difficulty prior to formal diet upgrade given mod cues for safety awareness and use of compensatory strategies. SLP Short Term Goal 2 (Week 1): Pt will utilize external memory aids to interpret daily schedule and recall basic information re: rehab stay given max verbal cues. SLP Short Term Goal 3 (Week 1): Patient will attend to the left field during functional problem solving tasks in 4/5 opportunities given max multimodal cues. SLP Short Term Goal 4 (Week 1): Patient will demonstrate awareness of cognitive and physical impairments by stating 2 of each when given max multimodal cues. SLP Short Term Goal 5 (Week 1): Patient will orient to date utilizing external aids when given mod multimodal cues. SLP Short Term Goal 6 (Week 1): Patient will utilize SLOP speech intelligibility strategies at the phrase level in 80% of opportunities given max cues.  Skilled Therapeutic Interventions: SLP conducted skilled therapy session targeting cognitive goals. SLP conducted various tasks targeting recall, left inattention, and awareness. This date, patient was total assist for recall of therapy sessions, interpretation of schedule, attention to left side of objects and environment, and for awareness of deficits from stroke. She required max to total cues for participation, often closing her eyes, folding her arms, and stating I don't know during questions/tasks between every prompt, which increased her need for overall cuing despite ability to perform better than demonstrated. Patient was left in room with call bell in reach and alarm set. SLP will continue to  target goals per plan of care.        Pain Pain Assessment Pain Score: 0-No pain  Therapy/Group: Individual Therapy  Clarrissa Shimkus, M.A., CCC-SLP  [place] A Mayo Faulk 07/23/2023, 11:28 AM

## 2023-07-23 NOTE — Progress Notes (Signed)
 Orthopedic Tech Progress Note Patient Details:  Kathryn Mcguire 06-16-1950 [id number]  Called in order to [employer] for a [facility]   Patient ID: [name], female   DOB: [birth year], 73 y.o.   MRN: [id number]  Delanna LITTIE Pac 07/23/2023, 9:55 AM

## 2023-07-23 NOTE — Progress Notes (Signed)
 Occupational Therapy Session Note  Patient Details  Name: Kathryn Mcguire MRN: [id number] Date of Birth: [birth year]  Today's Date: 07/23/2023 OT Individual Time: 9164-9047 and 8884-8844 OT Individual Time Calculation (min): 77 min and 40 min    Short Term Goals: Week 1:  OT Short Term Goal 1 (Week 1): Patient will trasnfer to toilet with mod assist OT Short Term Goal 2 (Week 1): Patient will complete clothing management during toileting wiht mod assist OT Short Term Goal 3 (Week 1): Patient will don pull over shirt with min assist following set up OT Short Term Goal 4 (Week 1): Patient will visually locate needed ADL items left of midline 5* with min cueing  Skilled Therapeutic Interventions/Progress Updates:    Visit 1:  no c/o pain (At start of session MD evaluated tone in pt's L arm.)  pt has mild elbow flexor and sh tone of 1 to 1+ with no subluxation due to the tone.  Pt received in bed and initially declined a shower stating she had one yesterday. Per notes, pt did not have a shower.  Pt sat up to EOB with mod A and then stepped to chair placed next to her dresser to have her have to turn head to left to find clothing in dressers.  Pt able to find clothing with mod cues.   Pt ambulated to bathroom stating she did not need to toilet.  Her brief was soaked with urine. Doffed brief and then pt urinated more.  See ADL documentation below. Overall max A and max cues due to severe apraxia, L neglect, and non functional LUE.  She does very well with sit to stands and can ambulate with tactile cues at hips or torso for navigation.    Pt taken to gym to work on L visual scanning and attention to left arm. Pt transferred to mat and ball placed under L arm to roll ball forward and back by therapist trying to initiate some movement from patient.  Pt only had trace movement in shoulder.  Pt returned to room with all needs met and alarm set.    Visit 2:  no pain Pt received in wc and taken to  gym.  Transferred to mat to work on L visual scanning with LUE wt bearing for sensory feedback with pt using R hand to stack cubes of the same color with max cues to attend to task and to follow directions.  She was tired today and often sinking head down but was able to stay alert enough to engage with me.  Pt returned to room and she declined need to toilet. Taken to bathroom just to try.  Pt was able to have a small void.    Pt resting in wc with all needs met and pillow under L arm for support.   Alarm on.   Therapy Documentation Precautions:  Precautions Precautions: Fall Recall of Precautions/Restrictions: Impaired Precaution/Restrictions Comments: L hemiplegia, h/o dementia Restrictions Weight Bearing Restrictions Per Provider Order: (P) No   Pain: Pain Assessment Pain Score: 0-No pain ADL: ADL Eating: Not assessed Grooming: Moderate assistance Where Assessed-Grooming: Sitting at sink, Standing at sink Upper Body Bathing: Moderate assistance Where Assessed-Upper Body Bathing: Shower Lower Body Bathing: Maximal assistance Where Assessed-Lower Body Bathing: Shower Upper Body Dressing: Maximal assistance Where Assessed-Upper Body Dressing: Chair Lower Body Dressing: Dependent Where Assessed-Lower Body Dressing: Sitting at sink, Standing at sink Toileting: Maximal assistance Where Assessed-Toileting: Teacher, adult education: Curator Method: Freight forwarder  Equipment: Engineer, technical sales: Unable to assess Tub/Shower Transfer Method: Unable to assess Film/video editor: Minimal assistance Film/video editor Method: Designer, industrial/product: Emergency planning/management officer   Therapy/Group: Individual Therapy  Storie Heffern 07/23/2023, 12:11 PM

## 2023-07-24 DIAGNOSIS — I63511 Cerebral infarction due to unspecified occlusion or stenosis of right middle cerebral artery: Secondary | ICD-10-CM | POA: Diagnosis not present

## 2023-07-24 LAB — GLUCOSE, CAPILLARY
Glucose-Capillary: 131 mg/dL — ABNORMAL HIGH (ref 70–99)
Glucose-Capillary: 146 mg/dL — ABNORMAL HIGH (ref 70–99)
Glucose-Capillary: 190 mg/dL — ABNORMAL HIGH (ref 70–99)
Glucose-Capillary: 145 mg/dL — ABNORMAL HIGH (ref 70–99)

## 2023-07-24 NOTE — Progress Notes (Signed)
 Occupational Therapy Weekly Progress Note  Patient Details  Name: Kathryn Mcguire MRN: [id number] Date of Birth: Dec 09, 1950  Beginning of progress report period: July 17, 2023 End of progress report period: July 24, 2023  Today's Date: 07/24/2023 OT Individual Time: 8550-8471 OT Individual Time Calculation (min): 39 min    Patient has met 1 of 4 short term goals. Kathryn Mcguire is making slow progress towards reaching her LTGs. D/t her motor planning deficits, L inattention/neglect, apraxia, and L hemiplegia, she requires max A for all ADLs and min HHA for toilet and shower transfers. She has demonstrated improved alertness, however requires maximal multimodal cues for attention, task initiation, awareness, safety awareness, and problem solving. She is demonstrating mild increase in tone in L pectoral region and elbow, however improvement noted with gentle AAROM.  Family education has been scheduled for later this week. Kathryn Mcguire d/c plan is go back to daughter's house as of right now, but discussions are being had about potential SNF placement due to how much assistance Kathryn Mcguire will likely require from family at home.   Patient continues to demonstrate the following deficits: muscle weakness and muscle joint tightness, decreased cardiorespiratoy endurance, impaired timing and sequencing, abnormal tone, unbalanced muscle activation, motor apraxia, decreased coordination, and decreased motor planning, left side neglect and decreased motor planning, decreased initiation, decreased attention, decreased awareness, decreased problem solving, decreased safety awareness, decreased memory, and delayed processing, central origin, and decreased sitting balance, decreased standing balance, decreased postural control, hemiplegia, and decreased balance strategies and therefore will continue to benefit from skilled OT intervention to enhance overall performance with BADL and Reduce care partner burden.  Patient not  progressing toward long term goals.  See goal revision..  Plan of care revisions: ADL goals downgraded base on Kathryn Mcguire ELOS and CLOF. See POC note for details.   OT Short Term Goals Week 1:  OT Short Term Goal 1 (Week 1): Patient will trasnfer to toilet with mod assist OT Short Term Goal 1 - Progress (Week 1): Met OT Short Term Goal 2 (Week 1): Patient will complete clothing management during toileting wiht mod assist OT Short Term Goal 2 - Progress (Week 1): Not met OT Short Term Goal 3 (Week 1): Patient will don pull over shirt with min assist following set up OT Short Term Goal 3 - Progress (Week 1): Not met OT Short Term Goal 4 (Week 1): Patient will visually locate needed ADL items left of midline 5* with min cueing OT Short Term Goal 4 - Progress (Week 1): Not met Week 2:  OT Short Term Goal 1 (Week 2): STG=LTG d/t ELOS  Skilled Therapeutic Interventions/Progress Updates:     Kathryn Mcguire received sitting up in wc, dressed and ready for the day. Kathryn Mcguire presenting with flat affect, receptive to skilled OT session reporting 0/10 pain- OT offering intermittent rest breaks, repositioning, and therapeutic support to optimize participation in therapy session. Focused this session on L side attention and functional cognition to improved Kathryn Mcguire's overall independence in ADLs and decrease bourdon of care. Kathryn Mcguire transported to/from therapy spaces in wc this session for energy conservation. Provided gentle AAROM of L UE at beginning of session to decrease tone, improve blood flow, and prevent onset of pain- tone noted in pectoral region, shoulder, and elbow with decreased external rotation. Improvement following ROM. Engaged Kathryn Mcguire in completing seated tabletop card activity to work on visual scanning to L, task initiation, problem solving, and attention to task. Kathryn Mcguire tasked with sorting stack of cards by (1)  color, (2) number, and then (3) sequence cards in order from 1-12. Kathryn Mcguire required max multimodal cues to initiate and continue task  and max questioning cues to locate and correct mistakes. She was able to locate cards in her L visual field, however required max direct verbal cues +increased time. Kathryn Mcguire requesting to return to bed at end of session. She completed short distance functional mobility with min HHA and required max multimodal cues to turn towards bed and ensure she sat on bed on her L side. EOB > supine CGA +verbal cues for technique. Kathryn Mcguire was left resting in bed with call bell in reach, bed alarm on, and all needs met.    Therapy Documentation Precautions:  Precautions Precautions: Fall Recall of Precautions/Restrictions: Impaired Precaution/Restrictions Comments: L hemiplegia, h/o dementia Restrictions Weight Bearing Restrictions Per Provider Order: No   Therapy/Group: Individual Therapy  Kathryn Mcguire 07/24/2023, 1:01 PM

## 2023-07-24 NOTE — Progress Notes (Signed)
 Physical Therapy Session Note  Patient Details  Name: Kathryn Mcguire MRN: [id number] Date of Birth: 06/13/1950  Today's Date: 07/24/2023 PT Individual Time: 9099-9044; 1117 - 1155 PT Individual Time Calculation (min): 55 min; 38 min   Short Term Goals: Week 1:  PT Short Term Goal 1 (Week 1): Pt will ambulate 100 with hand held support PT Short Term Goal 2 (Week 1): Pt will perform 5STS outcmoe measure PT Short Term Goal 3 (Week 1): Pt will perform sit to sidelying with minA PT Short Term Goal 4 (Week 1): Pt will perform car transfer with modA  SESSION 1 Skilled Therapeutic Interventions/Progress Updates: Patient semi-reclined in bd on entrance to room. Patient alert and agreeable to PT session.   Patient with no complaints of pain during session.   Therapeutic Activity: Bed Mobility: Pt required mod/maxA to donn personal pants supine in bed with target cuing for pt to reach PTA hand with L LE for SLR to thread through pants. Pt pulled R side of pants up to waist from knee height, but neglected the L despite multimodal cuing to use R UE. Pt cued to perform bridge, but ultimately required tactile cuing to somewhat perform (PTA donned after pt standing from EOG). Pt performed supine<sit on EOB with increased time/multimodal cuing to initiate movement to edge that ultimately required HHA (light minA) to transition to EOB. PTA donned shoes and cued to place feet on floor with pt doing so without increased time/effort required. Pt performed sit<supine at end of session to bed with minA due to L LE neglect to fully place on bed.  Transfers: Pt performed sit<>stand transfers throughout session with CGA to stand, and minA to safely sit on sitting surfaces (without AD).   Neuromuscular Re-ed: NMR facilitated during session with focus on L sided awareness, coordination, memory recall. - Pt ambulated throughout session (from room<day room gym and during interventions) with max cuing to increase  awareness to L side due to heavy L drift and neglect. PTA would cue pt to look to L while asking if pt sees either object or person on L that pt was about to run into with pt saying okay but would not go to the R to avoid running into object.  - Pt grabbing rings on table in day room to place on top of either red or green soft cone set up on 2 different tables (one on L and R roughly 15' from table of rings). Pt required maxA to locate yellow cone placed to the, and minA to locate green right on R after pivoting 180*. Pt able to recall where rings are located with only minA after placing on cone. Pt with heavy L drift that required maxA to prevent. Pt performed 2nd round with this time pt cued to pick up more than one ring of specific color, and to place on specified color cone in order with pt requiring maxA to increase L sided visual scan to find yellow cone, often times slightly facing directly with inability to locate with cuing to increase visual scan of area.  NMR performed for improvements in motor control and coordination, balance, sequencing, judgement, and self confidence/ efficacy in performing all aspects of mobility at highest level of independence.   Patient supine in bed at end of session with brakes locked, nsg present to provide personal care (pt transported back to room in [facility] at end of session due to soiled brief).   SESSION 2 Skilled Therapeutic Interventions/Progress Updates: Patient supine  in bed on entrance to room. Patient alert and agreeable to PT session.   Patient with no complaints of pain during session. Pt required increased time/effort throughout session to initiate motor planning for VC provided.  Therapeutic Activity: Bed Mobility: Pt performed supine<sit on EOB with light minA to elevate trunk. Transfers: Pt performed sit<>stand transfers throughout session with no AD and with CGA physically, but VC required to increase L sided awareness prior to sitting to ensure  in safe proximity to do so. Pt transported from room<>main gym in [facility] for energy conservation.  - Attempted to navigate steps, but pt required increased time/effort to initiate/motor plan advancing L LE onto step, despite multimodal cuing. Pt would step up with R LE onto first step past midline, and then not advance LE. Pt in minimally distracting environment (no one else in main gym).   Neuromuscular Re-ed: NMR facilitated during session with focus on L sided awareness/visual scanning/attention to task. - Pt cued to locate cones in main gym with max cuing required to locate anything on L, or to stay on task when finding cone to pick up and hold in hand. Pt would occasionally stand still and say okay when cued to find next cone with no initiation to look around room. Pt also required seated rest breaks throughout and VC to avoid bumping into stuff on L.   NMR performed for improvements in motor control and coordination, balance, sequencing, judgement, and self confidence/ efficacy in performing all aspects of mobility at highest level of independence.   Patient sitting in WC at end of session with brakes locked, belt alarm set, and all needs within reach.       Therapy Documentation Precautions:  Precautions Precautions: Fall Recall of Precautions/Restrictions: Impaired Precaution/Restrictions Comments: L hemiplegia, h/o dementia Restrictions Weight Bearing Restrictions Per Provider Order: No   Therapy/Group: Individual Therapy  Mariadel Mruk PTA 07/24/2023, 12:04 PM

## 2023-07-24 NOTE — Patient Care Conference (Signed)
 Inpatient RehabilitationTeam Conference and Plan of Care Update Date: 07/24/2023   Time: 10:13 AM    Patient Name: Kathryn Mcguire      Medical Record Number: [id number]  Date of Birth: 1950-06-26 Sex: Female         Room/Bed: 4W19C/4W19C-01 Payor Info: Payor: [employer] MEDICARE / Plan: [employer] MEDICARE HMO / Product Type: *No Product type* /    Admit Date/Time:  07/16/2023  5:46 PM  Primary Diagnosis:  Acute ischemic right MCA stroke [facility])  Hospital Problems: Principal Problem:   Acute ischemic right MCA stroke [facility])    Expected Discharge Date: Expected Discharge Date: 07/31/23  Team Members Present: Physician leading conference: Dr. Prentice Compton Social Worker Present: Graeme Jude, LCSW Nurse Present: Barnie Ronde, RN PT Present: Delinda Bertrand, Willis Milliner, PT OT Present: Katheryn Mines, OT SLP Present: Joane Fuss, SLP PPS Coordinator present : Eleanor Colon, SLP     Current Status/Progress Goal Weekly Team Focus  Bowel/Bladder      Incontinent of bowel and bladder    Manage incontinence with min assist    Toileting protocol  Swallow/Nutrition/ Hydration   Dys2/thin liquids   min assist  ongoing tolernace, solids upgrade    ADL's   max A overall due to severely impaired L side awareness, non functional LUE, L homon hemian., apraxia, developing tone in LUE   min A overall - will need to downgrade to mod A   ADL training focused on L side awareness and motor planning, pt fam education    Mobility   can be up to min A for mobility but max cues needed due to cognition, L inattention, motor planning   min A overall ambulatory  functional mobility retraining with empahsis on cognitive impact, family education for fluctuating levels of assist needed    Communication   min dysarthria, but mostly due to edentulous state   min assist   introduction of speech intelligibliity increasing strategies    Safety/Cognition/ Behavioral Observations  max to  total assist for left inattention,attention, memory, problem solving   modA   left inattention, problem solving, attention, use of memory strategies, use of external orientation aids    Pain      N/A          Skin      MASD; medications adjusted for diarrhea management   MASD healing    Skin check q shift and application of cream after toileting    Discharge Planning:  Pt will likely return home with her youngest dtr bing primary caregiver, and support from ex-husband that lives in the home. PRN support from various family members/children. SW will confirm there are no barriers to discharge.   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  Progress limited by left necglect and weakness with apraxia and dementia which limits compensation for poor attention memory and problem solving.   Patient on target to meet rehab goals: no, goals downgraded to mod assist.   Needs max assist for ADLs and min -mod assist for mobility with min assist with HHA for transfers due to motor planning deficits and apraxia.  Maintained on a dysphagia 2 thin liquid diet. Requires min assist for communication.   *See Care Plan and progress notes for long and short-term goals.   Revisions to Treatment Plan:  Ritalin  trial   Teaching Needs: Safety, medications, transfers,  toileting, etc.   Current Barriers to Discharge: Home enviroment access/layout, Incontinence, and Lack of/limited family support  Possible Resolutions to Barriers: Family education SNF recommended HH follow up services DME: BSC, TTB, hospital bed and wheelchair     Medical Summary Current Status: left hemiparesis, left neglect , poor cognitive awareness, blood sugar management improving but needed med adjustment , diarrhea improved  Barriers to Discharge: Medical stability   Possible  Resolutions to Barriers/Weekly Focus: IVF stopped , labwork normalized, caregiver training   Continued Need for Acute Rehabilitation Level of Care: The patient requires daily medical management by a physician with specialized training in physical medicine and rehabilitation for the following reasons: Direction of a multidisciplinary physical rehabilitation program to maximize functional independence : Yes Medical management of patient stability for increased activity during participation in an intensive rehabilitation regime.: Yes Analysis of laboratory values and/or radiology reports with any subsequent need for medication adjustment and/or medical intervention. : Yes   I attest that I was present, lead the team conference, and concur with the assessment and plan of the team.   Kathryn Mcguire 07/24/2023, 1:38 PM

## 2023-07-24 NOTE — Plan of Care (Signed)
 Goals downgraded based on Pt's CLOF and ELOS. Pt limited by L inattention/neglect, apraxia, and motor planning challenges.   Problem: RH Grooming Goal: LTG Patient will perform grooming w/assist,cues/equip (OT) Description: LTG: Patient will perform grooming with assist, with/without cues using equipment (OT) 07/24/2023 1556 by Leavy Katheryn SQUIBB, OT Note: Goals downgraded based on Pt's CLOF and ELOS. Pt limited by L inattention/neglect, apraxia, and motor planning challenges.  07/24/2023 1550 by Leavy Katheryn SQUIBB, OT Flowsheets (Taken 07/24/2023 1550) LTG: Pt will perform grooming with assistance level of: Moderate Assistance - Patient 50 - 74%   Problem: RH Bathing Goal: LTG Patient will bathe all body parts with assist levels (OT) Description: LTG: Patient will bathe all body parts with assist levels (OT) 07/24/2023 1556 by Leavy Katheryn SQUIBB, OT Note: Goals downgraded based on Pt's CLOF and ELOS. Pt limited by L inattention/neglect, apraxia, and motor planning challenges.  07/24/2023 1555 by Leavy Katheryn SQUIBB, OT Note: Goals downgraded based on Pt's CLOF and ELOS. Pt limited by L inattention/neglect, apraxia, and motor planning challenges.  07/24/2023 1550 by Leavy Katheryn SQUIBB, OT Flowsheets (Taken 07/24/2023 1550) LTG: Pt will perform bathing with assistance level/cueing: Moderate Assistance - Patient 50 - 74%   Problem: RH Dressing Goal: LTG Patient will perform upper body dressing (OT) Description: LTG Patient will perform upper body dressing with assist, with/without cues (OT). 07/24/2023 1555 by Leavy Katheryn SQUIBB, OT Note: Goals downgraded based on Pt's CLOF and ELOS. Pt limited by L inattention/neglect, apraxia, and motor planning challenges.  07/24/2023 1550 by Leavy Katheryn SQUIBB, OT Flowsheets (Taken 07/24/2023 1550) LTG: Pt will perform upper body dressing with assistance level of: Moderate Assistance - Patient 50 - 74% Goal: LTG Patient will perform lower body dressing w/assist (OT) Description:  LTG: Patient will perform lower body dressing with assist, with/without cues in positioning using equipment (OT) 07/24/2023 1555 by Leavy Katheryn SQUIBB, OT Note: Goals downgraded based on Pt's CLOF and ELOS. Pt limited by L inattention/neglect, apraxia, and motor planning challenges.  07/24/2023 1550 by Leavy Katheryn SQUIBB, OT Flowsheets (Taken 07/24/2023 1550) LTG: Pt will perform lower body dressing with assistance level of: Moderate Assistance - Patient 50 - 74%   Problem: RH Toileting Goal: LTG Patient will perform toileting task (3/3 steps) with assistance level (OT) Description: LTG: Patient will perform toileting task (3/3 steps) with assistance level (OT)  07/24/2023 1555 by Leavy Katheryn SQUIBB, OT Note: Goals downgraded based on Pt's CLOF and ELOS. Pt limited by L inattention/neglect, apraxia, and motor planning challenges.  07/24/2023 1550 by Leavy Katheryn SQUIBB, OT Flowsheets (Taken 07/24/2023 1550) LTG: Pt will perform toileting task (3/3 steps) with assistance level: Moderate Assistance - Patient 50 - 74%

## 2023-07-24 NOTE — Progress Notes (Addendum)
 Patient ID: Kathryn Mcguire, female   DOB: 02/06/1950, 74 y.o.   MRN: [id number]  [id number] spoke with pt dtr Edsel to provide updates from team conference, and d/c date 7/23. Discussed family edu. Will schedule for Friday 1pm-4pm and if after speaking with her sister's this time does not work, she will follow-up Discussed short term rehab/SNF in length and process. She reports her sister is has submitted her Medicaid application and waiting on approval. Discussed aide options that are available if Medicaid is approved.  *family will look at [email or web address] for HHA and follow-up with SW.   Graeme Jude, MSW, LCSW Office: [phone] Cell: [phone] Fax: [phone]

## 2023-07-24 NOTE — Progress Notes (Signed)
 Physical Therapy Weekly Progress Note  Patient Details  Name: Kathryn Mcguire MRN: [id number] Date of Birth: 08-Dec-1950  Beginning of progress report period: July 17, 2023 End of progress report period: July 24, 2023  Patient has met 4 of 4 short term goals. Pt is currently on track in meeting most of LTG's set forth by evaluating PT. It took close to 6 min for pt to do 5xSTS due to poor initiation, and roughly 10 min to perform car transfer due to same reasoning (but able to do so with minA physically). Pt is currently limited by significant L sided inattention that makes ambulating unsafe without at least minA at times to avoid walking into objects on L despite cuing for pt to visually see object pt would almost run in to, but would attempt to continue forward without making any corrections (otherwise, pt able to maintain standing balance with CGA/minA, but maxA with cuing and redirection for safety). Pt demonstrates increased time/effort to motor plan cues given by therapists. Pt has yet to perform stair training due to poor awareness to L side, and would step up to first step with R LE but presents with apraxia as pt would continuously step with R LE when given multimodal cuing to advance L. Family education has been scheduled for later this week. Pt d/c plan is go back to daughter's house as of right now, but discussions are being had about potential SNF placement due to how much assistance pt will likely require from family at home.   Patient continues to demonstrate the following deficits {impairments:3041632} and therefore will continue to benefit from skilled PT intervention to increase functional independence with mobility.  Patient {LTG progression:3041653}.  {plan of rjmz:[id number]}  PT Short Term Goals Week 1:  PT Short Term Goal 1 (Week 1): Pt will ambulate 100 with hand held support PT Short Term Goal 1 - Progress (Week 1): Met PT Short Term Goal 2 (Week 1): Pt will perform 5STS  outcmoe measure PT Short Term Goal 2 - Progress (Week 1): Met PT Short Term Goal 3 (Week 1): Pt will perform sit to sidelying with minA PT Short Term Goal 3 - Progress (Week 1): Met PT Short Term Goal 4 (Week 1): Pt will perform car transfer with modA PT Short Term Goal 4 - Progress (Week 1): Met  Skilled Therapeutic Interventions/Progress Updates:      Therapy Documentation Precautions:  Precautions Precautions: Fall Recall of Precautions/Restrictions: Impaired Precaution/Restrictions Comments: L hemiplegia, h/o dementia Restrictions Weight Bearing Restrictions Per Provider Order: No  Dominic Sandoval PTA   07/24/2023, 12:51 PM

## 2023-07-24 NOTE — Progress Notes (Signed)
 PROGRESS NOTE   Subjective/Complaints:  No issues overnite , pt working with SLP on both cognition and dysphagia.  May trial D3 Recorded fluid intake is limited (300-400 mL/D)  ROS: Patient denies CP, SOB  Objective:   No results found. Recent Labs    07/22/23 0515  WBC 9.0  HGB 11.9*  HCT 36.8  PLT 290   Recent Labs    07/23/23 1845  NA 135  K 4.3  CL 98  CO2 25  GLUCOSE 161*  BUN 16  CREATININE 0.99  CALCIUM  9.9     Intake/Output Summary (Last 24 hours) at 07/24/2023 0852 Last data filed at 07/23/2023 1700 Gross per 24 hour  Intake 326 ml  Output --  Net 326 ml        Physical Exam: Vital Signs Blood pressure 110/67, pulse 72, temperature 98.4 F (36.9 C), temperature source Oral, resp. rate 17, height 5' 3 (1.6 m), weight 55 kg, SpO2 100%.   General: No acute distress Mood and affect are appropriate Heart: Regular rate and rhythm no rubs murmurs or extra sounds Lungs: Clear to auscultation, breathing unlabored, no rales or wheezes Abdomen: Positive bowel sounds, soft nontender to palpation, nondistended Extremities: No clubbing, cyanosis, or edema Skin: No evidence of breakdown, no evidence of rash   Neurologic:Oriented to person, [facility], stroke. Cranial nerves II through XII intact, motor strength is 4/5 in Right hand. 0/5 left deltoid, bicep, tricep, grip. Did spontaneously move left hip flexors, knee extensors, ankle dorsiflexor and plantar flexors but was not consistent during MMT. She  exhibits early tone in LUE: tr pecs, 1 biceps, tr wrist flexors.  Sensory exam normal sensation to light touch and proprioception in bilateral upper and lower extremities Cerebellar exam normal finger to nose to finger as well as heel to shin in bilateral upper and lower extremities Musculoskeletal: Full range of motion in all 4 extremities. No joint swelling    Assessment/Plan: 1. Functional deficits which  require 3+ hours per day of interdisciplinary therapy in a comprehensive inpatient rehab setting. Physiatrist is providing close team supervision and 24 hour management of active medical problems listed below. Physiatrist and rehab team continue to assess barriers to discharge/monitor patient progress toward functional and medical goals  Care Tool:  Bathing    Body parts bathed by patient: Face, Chest, Front perineal area, Abdomen   Body parts bathed by helper: Right arm, Left arm, Right upper leg, Left upper leg, Right lower leg, Left lower leg, Buttocks     Bathing assist Assist Level: Maximal Assistance - Patient 24 - 49%     Upper Body Dressing/Undressing Upper body dressing   What is the patient wearing?: Bra, Pull over shirt    Upper body assist Assist Level: Maximal Assistance - Patient 25 - 49%    Lower Body Dressing/Undressing Lower body dressing      What is the patient wearing?: Pants, Underwear/pull up     Lower body assist Assist for lower body dressing: Total Assistance - Patient < 25%     Toileting Toileting Toileting Activity did not occur (Clothing management and hygiene only): N/A (no void or bm)  Toileting assist Assist for toileting:  Maximal Assistance - Patient 25 - 49%     Transfers Chair/bed transfer  Transfers assist  Chair/bed transfer activity did not occur: Safety/medical concerns  Chair/bed transfer assist level: Minimal Assistance - Patient > 75%     Locomotion Ambulation   Ambulation assist      Assist level: Minimal Assistance - Patient > 75% Assistive device: Hand held assist Max distance: 100'   Walk 10 feet activity   Assist     Assist level: Contact Guard/Touching assist Assistive device: Hand held assist   Walk 50 feet activity   Assist    Assist level: Contact Guard/Touching assist Assistive device: Hand held assist    Walk 150 feet activity   Assist Walk 150 feet activity did not occur:  Safety/medical concerns         Walk 10 feet on uneven surface  activity   Assist Walk 10 feet on uneven surfaces activity did not occur: Safety/medical concerns         Wheelchair     Assist Is the patient using a wheelchair?: Yes Type of Wheelchair: Manual    Wheelchair assist level: Dependent - Patient 0%      Wheelchair 50 feet with 2 turns activity    Assist        Assist Level: Dependent - Patient 0%   Wheelchair 150 feet activity     Assist      Assist Level: Dependent - Patient 0%   Blood pressure 110/67, pulse 72, temperature 98.4 F (36.9 C), temperature source Oral, resp. rate 17, height 5' 3 (1.6 m), weight 55 kg, SpO2 100%.  Medical Problem List and Plan: 1. 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  Antithrombotics: -DVT/anticoagulation:  Pharmaceutical: Lovenox  40mg  daily             -antiplatelet therapy: DAPT X 3 months followed by ASA alone.  3. Pain Management/spasticity: tylenol  prn  -7/15 pt with early flexor tone of LUE. It is mild, and I think at this point, general ROM and positioning is adequate.    -will order WHO for LUE 4. Mood/Behavior/Sleep: LCSW to follow for evaluation and support.              --does get up 3-4 times every night (food/tobacco)             -antipsychotic agents: N/a 5. Neuropsych/cognition: This patient is not capable of making decisions on her own behalf. 6. Skin/Wound Care: Routine pressure relief measures.  7. Fluids/Electrolytes/Nutrition: Monitor I/O. Family reports great appetite-->eats all day long. Eating 100% Off IVF will d/c IV 8.  T2DM: Hgb A1c- 12.3. Monitor BS ac/hs and use SSI for elevated BS             --Was on 70/30 insulin  6 units bid and metformin  1000 mg bid   -Will restart 70/30 insulin  at 6mg  bid on 7/10, improved  -will add glyburide  and dc metformin  given recent loose stool -7/15 CBG low this am. Will hold PM glyburide         CBG (last 3)  Recent Labs    07/23/23 1658 07/23/23 2104 07/24/23 0559  GLUCAP 123* 182* 131*  Controlled 7/16 9. 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  -continue supplements  10. H/o bipolar d/o: Used to have manic episodes/wander, etc but stable off medications.   11. Hyperlipidemia: Continue increased dose lipitor 40mg  daily  12. Dementia: On Aricept  5mg  nightly (followed by Dr. Georjean in the past) -per daughters pt was generally independent within the home, needing  supervision with bathing and some ADL's for safety.   13. COPD/Tobacco use: Chews 4 packs of tobacco daily.    -if having cravings, might be useful to use patches while here-- monitor  14. Blood pressure: Monitor TID--keep SBP > 110. --avoid hypotension given R-ICA occlusion.  May d/c IVF after current bag -7/15 bp's soft--seems to be tolerating---no changes for now -push fluids.  Vitals:   07/20/23 1928 07/21/23 0541 07/21/23 1329 07/21/23 1947  BP: 99/64 124/66 (!) 109/58 123/69   07/22/23 0248 07/22/23 1437 07/22/23 1940 07/23/23 0620  BP: 122/73 115/68 114/64 131/65   07/23/23 1537 07/23/23 2000 07/24/23 0443 07/24/23 0800  BP: (!) 139/123 107/71 (!) 141/78 110/67    15. Mild leucocytosis: WBC up to 10.4 but no signs of fever or infection. Improved 7/10  -07/22/23 WBC as below    Latest Ref Rng & Units 07/22/2023    5:15 AM 07/21/2023    7:24 AM 07/18/2023    6:07 AM  CBC  WBC 4.0 - 10.5 K/uL 9.0  8.1  8.7   Hemoglobin 12.0 - 15.0 g/dL 88.0  87.1  86.5   Hematocrit 36.0 - 46.0 % 36.8  39.7  41.9   Platelets 150 - 400 K/uL 290  266  227     16. Mild hyponatremia: resolved     Latest Ref Rng & Units 07/23/2023    6:45 PM 07/21/2023    7:24 AM 07/17/2023     6:00 AM  BMP  Glucose 70 - 99 mg/dL 838  887  779   BUN 8 - 23 mg/dL 16  14  19    Creatinine 0.44 - 1.00 mg/dL 9.00  9.33  9.34   Sodium 135 - 145 mmol/L 135  135  134   Potassium 3.5 - 5.1 mmol/L 4.3  3.9  4.6   Chloride 98 - 111 mmol/L 98  103  101   CO2 22 - 32 mmol/L 25  21  23    Calcium  8.9 - 10.3 mg/dL 9.9  9.1  9.3     17. Diarrhea:  -07/20/23 pt having multiple episodes of diarrhea over last few days, imodium  added, labs for tomorrow.  -unclear etiology, doubt c.diff but consider checking if continued/symptoms change -could be from nicotine  withdrawal? Or if metformin  was just recently added back? -7/13-14/25 diarrhea stopped with one dose of imodium  7/12 -labs unremarkable.  -stopped metformin      LOS: 8 days A FACE TO FACE EVALUATION WAS PERFORMED  Prentice FORBES Compton 07/24/2023, 8:52 AM

## 2023-07-24 NOTE — Progress Notes (Signed)
 Speech Language Pathology Weekly Progress and Session Note  Patient Details  Name: Kathryn Mcguire MRN: [id number] Date of Birth: Aug 29, 1950  Beginning of progress report period: July 17, 2023 End of progress report period: July 24, 2023  Today's Date: 07/24/2023 SLP Individual Time: 0802-0900 SLP Individual Time Calculation (min): 58 min  Short Term Goals: Week 1: SLP Short Term Goal 1 (Week 1): Pt will tolerate trials of Dys2 solids with no overt s/sx of oral difficulty prior to formal diet upgrade given mod cues for safety awareness and use of compensatory strategies. SLP Short Term Goal 1 - Progress (Week 1): Met SLP Short Term Goal 2 (Week 1): Pt will utilize external memory aids to interpret daily schedule and recall basic information re: rehab stay given max verbal cues. SLP Short Term Goal 2 - Progress (Week 1): Met SLP Short Term Goal 3 (Week 1): Patient will attend to the left field during functional problem solving tasks in 4/5 opportunities given max multimodal cues. SLP Short Term Goal 3 - Progress (Week 1): Met SLP Short Term Goal 4 (Week 1): Patient will demonstrate awareness of cognitive and physical impairments by stating 2 of each when given max multimodal cues. SLP Short Term Goal 4 - Progress (Week 1): Not met SLP Short Term Goal 5 (Week 1): Patient will orient to date utilizing external aids when given mod multimodal cues. SLP Short Term Goal 5 - Progress (Week 1): Met SLP Short Term Goal 6 (Week 1): Patient will utilize SLOP speech intelligibility strategies at the phrase level in 80% of opportunities given max cues. SLP Short Term Goal 6 - Progress (Week 1): Met  New Short Term Goals: Week 2: SLP Short Term Goal 1 (Week 2): STGs= LTGS due to ELOS  Weekly Progress Updates: Pt has made steady gains and has met 5 of 6 STG's this reporting period due to improved tolerance of D2 diet, attention to L visual field, orientation, and speech intelligibility. Pt's diet  assessed and she currently tolerates a D2 diet and thin liquids.Pt is ~90% intelligible in phrases though conitnues to present with flat affect and some imprecise articulation suspect due to edentulous status. Currently pt continues to require mod to max  A for recall, attention, L inattention, awareness and problem solving. Pt/ family education ongoing. Pt would benefit from continued skilled SLP intervention to maximize cognition, speech intelligibility, and swallowing in order to maximize her functional independence prior to discharge.   Intensity: Minumum of 1-2 x/day, 30 to 90 minutes Frequency: 3 to 5 out of 7 days Duration/Length of Stay: 12-14 days Treatment/Interventions: Patient/family education;Dysphagia/aspiration precaution training;Internal/external aids;Therapeutic Activities;Functional tasks;Cueing hierarchy;Cognitive remediation/compensation;Environmental controls   Daily Session  Skilled Therapeutic Interventions: Patient was seen in am to address cognitive re- training and dysphagia management. Pt was alert upon SLP arrivaql. Assigned NT present and assisting with feeding. Pt indep oriented to temporal, spatial, and rational for hospitalization. Initially poor awareness of limitations observed. SLP incorporating interval training to recall deficits post CVA along with functional information. By end of session, pt recalled afflicted side given min A, time of hospitalization given min A, and recommendations to not ambulate alone with sup A. Pt followed simple directions given min A. Given D2 trials administered, pt safely consumed textures given adequate oral phase, good oral clearance without pocketing, and no overt s/s aspiration. She intermittently consumed thin liquids via straw throughout session with no s/s aspiration. SLP recommending continuation of current diet with advanced trials to be completed with SLP.  SLP addressing L inattention functionally though challenging pt to  identify objects on left side of room and outside of room. Pt warranting min to mod A. Speech intelligibility judged to be 90% in phrases though flat affect remains. At conclusion of session, pt was left upright in bed with call button within reach and bed alarm active. SLP to continue POC.     General    Pain Pain Assessment Pain Scale: 0-10 Pain Score: 0-No pain  Therapy/Group: Individual Therapy  Joane GORMAN Fuss 07/24/2023, 11:41 AM

## 2023-07-25 DIAGNOSIS — I63511 Cerebral infarction due to unspecified occlusion or stenosis of right middle cerebral artery: Secondary | ICD-10-CM | POA: Diagnosis not present

## 2023-07-25 LAB — CBC
HCT: 39 % (ref 36.0–46.0)
Hemoglobin: 12.5 g/dL (ref 12.0–15.0)
MCH: 26 pg (ref 26.0–34.0)
MCHC: 32.1 g/dL (ref 30.0–36.0)
MCV: 81.3 fL (ref 80.0–100.0)
Platelets: 314 K/uL (ref 150–400)
RBC: 4.8 MIL/uL (ref 3.87–5.11)
RDW: 13.7 % (ref 11.5–15.5)
WBC: 8.2 K/uL (ref 4.0–10.5)
nRBC: 0 % (ref 0.0–0.2)

## 2023-07-25 LAB — GLUCOSE, CAPILLARY
Glucose-Capillary: 129 mg/dL — ABNORMAL HIGH (ref 70–99)
Glucose-Capillary: 105 mg/dL — ABNORMAL HIGH (ref 70–99)
Glucose-Capillary: 134 mg/dL — ABNORMAL HIGH (ref 70–99)
Glucose-Capillary: 154 mg/dL — ABNORMAL HIGH (ref 70–99)

## 2023-07-25 NOTE — Plan of Care (Signed)
  Problem: Consults Goal: RH STROKE PATIENT EDUCATION Description: See Patient Education module for education specifics  Outcome: Progressing   Problem: RH BOWEL ELIMINATION Goal: RH STG MANAGE BOWEL WITH ASSISTANCE Description: STG Manage Bowel with mod I Assistance. Outcome: Progressing Goal: RH STG MANAGE BOWEL W/MEDICATION W/ASSISTANCE Description: STG Manage Bowel with Medication with mod I Assistance. Outcome: Progressing   Problem: RH BLADDER ELIMINATION Goal: RH STG MANAGE BLADDER WITH ASSISTANCE Description: STG Manage Bladder With toileting /min Assistance Outcome: Progressing   Problem: RH SAFETY Goal: RH STG ADHERE TO SAFETY PRECAUTIONS W/ASSISTANCE/DEVICE Description: STG Adhere to Safety Precautions With supervision Assistance/Device. Outcome: Progressing   Problem: RH KNOWLEDGE DEFICIT Goal: RH STG INCREASE KNOWLEDGE OF DIABETES Description: Patient's dtr will be able to manage DM using educational resources for medications and dietary modification independently Outcome: Progressing Goal: RH STG INCREASE KNOWLEDGE OF HYPERTENSION Description: Patient's dtr will be able to manage HTN using educational resources for medications and dietary modification independently Outcome: Progressing Goal: RH STG INCREASE KNOWLEDGE OF DYSPHAGIA/FLUID INTAKE Description: Patient's dtr will be able to manage Dysphagia using educational resources for medications and dietary modification independently Outcome: Progressing Goal: RH STG INCREASE KNOWLEGDE OF HYPERLIPIDEMIA Description: Patient's dtr will be able to manage HLD using educational resources for medications and dietary modification independently Outcome: Progressing Goal: RH STG INCREASE KNOWLEDGE OF STROKE PROPHYLAXIS Description: Patient's dtr will be able to manage secondary risks using educational resources for medications and dietary modification independently Outcome: Progressing   Problem: Education: Goal: Ability  to describe self-care measures that may prevent or decrease complications (Diabetes Survival Skills Education) will improve Outcome: Progressing Goal: Individualized Educational Video(s) Outcome: Progressing   Problem: Coping: Goal: Ability to adjust to condition or change in health will improve Outcome: Progressing   Problem: Fluid Volume: Goal: Ability to maintain a balanced intake and output will improve Outcome: Progressing   Problem: Health Behavior/Discharge Planning: Goal: Ability to identify and utilize available resources and services will improve Outcome: Progressing Goal: Ability to manage health-related needs will improve Outcome: Progressing   Problem: Metabolic: Goal: Ability to maintain appropriate glucose levels will improve Outcome: Progressing   Problem: RH Vision Goal: RH LTG Vision (Specify) Outcome: Progressing

## 2023-07-25 NOTE — Progress Notes (Signed)
 Speech Language Pathology Daily Session Note  Patient Details  Name: Kathryn Mcguire MRN: [id number] Date of Birth: Jan 16, 1950  Today's Date: 07/25/2023 SLP Individual Time: 8953-8885 [id number] SLP Individual Time Calculation (min): 28 min 43 minutes   Short Term Goals: Week 2: SLP Short Term Goal 1 (Week 2): STGs= LTGS due to ELOS  Skilled Therapeutic Interventions: Session 1: Skilled therapy session focused on cognitive goals. SLP facilitated session by prompting patient to complete L attention exercises. Patient required maxA this date to sequence days of the week and months of the year placed across table span. Patient with poor participation this date often saying I don't know when prompted to complete task. Patient independently oriented to self, situation, location and month/year, though required maxA to utilize calendar for date/day of the week. Patient left in chair with alarm set and call bell in reach. Continue POC.  Session 2: Skilled therapy session focused on dysphagia and cognitive goals. SLP observed patient during consumption of D2/thin liquid lunch tray. Patient with improved participation this session, requiring minA to slow rate of consumption and consume smaller bolus sizes. Patient with complete oral clearance and independent use of liquid wash. Observed occasional instances of L anterior spill when bolus size was too large. No s/sx of aspiration. Recommend continuation of current diet. SLP targeted cognitive goals through recall of biographical information. Patient recalled hometown, job, and children this session. Patient continues to demonstrate difficulty orienting to time and age, requiring maxA. Patient left in [facility] with alarm set and call bell in reach. Continue POC.  Pain Session 1: None reported  Session 2: None reported   Therapy/Group: Individual Therapy  Andric Kerce M.A., CCC-SLP 07/25/2023, 7:44 AM

## 2023-07-25 NOTE — Progress Notes (Addendum)
 PROGRESS NOTE   Subjective/Complaints:  No issues overnite , discussed d/c date and cessation of tobacco use (oral)   ROS: Patient denies CP, SOB  Objective:   No results found. No results for input(s): WBC, HGB, HCT, PLT in the last 72 hours.  Recent Labs    07/23/23 1845  NA 135  K 4.3  CL 98  CO2 25  GLUCOSE 161*  BUN 16  CREATININE 0.99  CALCIUM  9.9     Intake/Output Summary (Last 24 hours) at 07/25/2023 0739 Last data filed at 07/24/2023 1849 Gross per 24 hour  Intake 590 ml  Output --  Net 590 ml        Physical Exam: Vital Signs Blood pressure 122/72, pulse 64, temperature 97.9 F (36.6 C), resp. rate 15, height 5' 3 (1.6 m), weight 55.7 kg, SpO2 99%.   General: No acute distress Mood and affect are appropriate Heart: Regular rate and rhythm no rubs murmurs or extra sounds Lungs: Clear to auscultation, breathing unlabored, no rales or wheezes Abdomen: Positive bowel sounds, soft nontender to palpation, nondistended Extremities: No clubbing, cyanosis, or edema Skin: No evidence of breakdown, no evidence of rash   Neurologic:Oriented to person, [facility], stroke. Cranial nerves II through XII intact, motor strength is 4/5 in Right hand. 0/5 left deltoid, bicep, tricep, grip. Did spontaneously move left hip flexors, knee extensors, ankle dorsiflexor and plantar flexors but was not consistent during MMT. She  exhibits early tone in LUE: tr pecs, 1 biceps, tr wrist flexors.  Sensory exam normal sensation to light touch and proprioception in bilateral upper and lower extremities Cerebellar exam normal finger to nose to finger as well as heel to shin in bilateral upper and lower extremities Musculoskeletal: Full range of motion in all 4 extremities. No joint swelling    Assessment/Plan: 1. Functional deficits which require 3+ hours per day of interdisciplinary therapy in a comprehensive inpatient  rehab setting. Physiatrist is providing close team supervision and 24 hour management of active medical problems listed below. Physiatrist and rehab team continue to assess barriers to discharge/monitor patient progress toward functional and medical goals  Care Tool:  Bathing    Body parts bathed by patient: Face, Chest, Front perineal area, Abdomen   Body parts bathed by helper: Right arm, Left arm, Right upper leg, Left upper leg, Right lower leg, Left lower leg, Buttocks     Bathing assist Assist Level: Maximal Assistance - Patient 24 - 49%     Upper Body Dressing/Undressing Upper body dressing   What is the patient wearing?: Bra, Pull over shirt    Upper body assist Assist Level: Maximal Assistance - Patient 25 - 49%    Lower Body Dressing/Undressing Lower body dressing      What is the patient wearing?: Pants, Underwear/pull up     Lower body assist Assist for lower body dressing: Total Assistance - Patient < 25%     Toileting Toileting Toileting Activity did not occur Press photographer and hygiene only): N/A (no void or bm)  Toileting assist Assist for toileting: Maximal Assistance - Patient 25 - 49%     Transfers Chair/bed transfer  Transfers assist  Chair/bed transfer  activity did not occur: Safety/medical concerns  Chair/bed transfer assist level: Minimal Assistance - Patient > 75%     Locomotion Ambulation   Ambulation assist      Assist level: Minimal Assistance - Patient > 75% Assistive device: Hand held assist Max distance: 100'   Walk 10 feet activity   Assist     Assist level: Contact Guard/Touching assist Assistive device: Hand held assist   Walk 50 feet activity   Assist    Assist level: Contact Guard/Touching assist Assistive device: Hand held assist    Walk 150 feet activity   Assist Walk 150 feet activity did not occur: Safety/medical concerns         Walk 10 feet on uneven surface  activity   Assist Walk  10 feet on uneven surfaces activity did not occur: Safety/medical concerns         Wheelchair     Assist Is the patient using a wheelchair?: Yes Type of Wheelchair: Manual    Wheelchair assist level: Dependent - Patient 0%      Wheelchair 50 feet with 2 turns activity    Assist        Assist Level: Dependent - Patient 0%   Wheelchair 150 feet activity     Assist      Assist Level: Dependent - Patient 0%   Blood pressure 122/72, pulse 64, temperature 97.9 F (36.6 C), resp. rate 15, height 5' 3 (1.6 m), weight 55.7 kg, SpO2 99%.  Medical Problem List and Plan: 1. 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  Antithrombotics: -DVT/anticoagulation:  Pharmaceutical: Lovenox  40mg  daily             -antiplatelet therapy: DAPT X 3 months followed by ASA alone.  3. Pain Management/spasticity: tylenol  prn  -7/15 pt with early flexor tone of LUE. It is mild, and I think at this point, general ROM and positioning is adequate.    -will order WHO for LUE 4. Mood/Behavior/Sleep: LCSW to follow for evaluation and support.              --does get up 3-4 times every night (food/tobacco)             -antipsychotic agents: N/a 5. Neuropsych/cognition: This patient is not capable of making decisions on her own behalf. 6. Skin/Wound Care: Routine pressure relief measures.  7. Fluids/Electrolytes/Nutrition: Monitor I/O. Family reports great appetite-->eats all day long. Eating 100% Off IVF will d/c IV 8.  T2DM: Hgb A1c- 12.3. Monitor BS ac/hs and use SSI for elevated BS             --Was on 70/30 insulin  6 units bid and metformin  1000 mg bid  -Will restart 70/30 insulin  at 6mg  bid on 7/10, improved  -will add glyburide  and dc metformin  given recent loose  stool -7/15 CBG low this am. Will hold PM glyburide         CBG (last 3)  Recent Labs    07/24/23 1637 07/24/23 2205 07/25/23 0620  GLUCAP 190* 145* 129*  Controlled 7/17 9. 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  -continue supplements  10. H/o bipolar d/o: Used to have manic episodes/wander, etc but stable off medications.   11. Hyperlipidemia: Continue increased dose lipitor 40mg  daily  12. Dementia: On Aricept  5mg  nightly (followed by Dr. Georjean in the past) -per daughters pt was generally independent within the home, needing  supervision with bathing and some ADL's for safety.   13. COPD/Tobacco use: Chews 4 packs of tobacco daily.    -if having cravings, might be useful to use patches while here-- monitor  14. Blood pressure: Monitor TID--keep SBP > 110. --avoid hypotension given R-ICA occlusion.  May d/c IVF after current bag -7/15 bp's soft--seems to be tolerating---no changes for now -push fluids.  Vitals:   07/21/23 1329 07/21/23 1947 07/22/23 0248 07/22/23 1437  BP: (!) 109/58 123/69 122/73 115/68   07/22/23 1940 07/23/23 0620 07/23/23 1537 07/23/23 2000  BP: 114/64 131/65 (!) 139/123 107/71   07/24/23 0443 07/24/23 0800 07/24/23 1306 07/25/23 0502  BP: (!) 141/78 110/67 103/80 122/72    15. Mild leucocytosis: WBC up to 10.4 but no signs of fever or infection. Improved 7/10  -07/22/23 WBC as below    Latest Ref Rng & Units 07/22/2023    5:15 AM 07/21/2023    7:24 AM 07/18/2023    6:07 AM  CBC  WBC 4.0 - 10.5 K/uL 9.0  8.1  8.7   Hemoglobin 12.0 - 15.0 g/dL 88.0  87.1  86.5   Hematocrit 36.0 - 46.0 % 36.8  39.7  41.9   Platelets 150 - 400 K/uL 290  266  227     16. Mild hyponatremia: resolved     Latest Ref Rng & Units 07/23/2023    6:45 PM 07/21/2023    7:24 AM 07/17/2023    6:00 AM  BMP  Glucose 70 - 99 mg/dL 838  887  779   BUN 8 - 23 mg/dL 16  14  19    Creatinine 0.44 - 1.00 mg/dL 9.00   9.33  9.34   Sodium 135 - 145 mmol/L 135  135  134   Potassium 3.5 - 5.1 mmol/L 4.3  3.9  4.6   Chloride 98 - 111 mmol/L 98  103  101   CO2 22 - 32 mmol/L 25  21  23    Calcium  8.9 - 10.3 mg/dL 9.9  9.1  9.3     17. Diarrhea:  -07/20/23 pt having multiple episodes of diarrhea over last few days, imodium  added, labs for tomorrow.  -unclear etiology, doubt c.diff but consider checking if continued/symptoms change -could be from nicotine  withdrawal? Or if metformin  was just recently added back? -7/13-14/25 diarrhea stopped with one dose of imodium  7/12 -labs unremarkable.  -stopped metformin      LOS: 9 days A FACE TO FACE EVALUATION WAS PERFORMED  Prentice FORBES Compton 07/25/2023, 7:39 AM

## 2023-07-25 NOTE — Progress Notes (Signed)
 Occupational Therapy Session Note  Patient Details  Name: Kathryn Mcguire MRN: [id number] Date of Birth: [birth year]  Today's Date: 07/25/2023 OT Individual Time: 9164-9054 OT Individual Time Calculation (min): 70 min    Short Term Goals: Week 2:  OT Short Term Goal 1 (Week 2): STG=LTG d/t ELOS  Skilled Therapeutic Interventions/Progress Updates:    Pt seen this session for ADL training with focus on L side awareness and motor planning. Her NT had just changed a wet brief and put on a clean one. Pt agreeable to a shower. Today pt needed INCREASED ASSIST AND CUES with all tasks.  She was  having great difficulty with motor planning and following 1 step directions. Had to use more visual and tactile cues. In previous days pt stood at sink with min A to guard balance, today she was leaning heavily to the L and not following cues to stand up straight so had her do oral care in sitting.  Due to lean and decreased L side awareness (more so than other day) opted to have pt do a stand pivot to toilet back to wc and then stand pivot to shower bench and back to wc but today pt needed increased cues as even  her LLE proprioception of where her L foot was was more impaired.  Pt was awake and alert this session but slow responses.  Pt assisted with pulling pants up to thighs as she was sitting in wc and I asked her what needed to happen next or what was not completed. Pt unable to understand even with touching cues.  Gave pt a few minutes to process but after three trials of trying to have pt understand what should happen next had to guide her to stand with mod A then I pulled pants up.  When asked to stand to pull up her pants, pt not able to initiate even after 4 trials.  Eventually had her put R hand on elevated hand rail to pull up on but also had to provide mod A to guide her up.    Pt taken to day room to engage in a L visual scanning task of matching 3 colored cones to colored discs.   Pt not engaging  well or responding well to cues. Pt awake and when asked what was on her mind  pt responds nothing.  Pt returned to room with all needs met.  Belt alarm on.    Therapy Documentation Precautions:  Precautions Precautions: Fall Recall of Precautions/Restrictions: Impaired Precaution/Restrictions Comments: L hemiplegia, h/o dementia Restrictions Weight Bearing Restrictions Per Provider Order: No   Pain: Pain Assessment Pain Score: 0-No pain ADL: ADL Eating: Minimal assistance Grooming: Moderate assistance Where Assessed-Grooming: Sitting at sink, Standing at sink Upper Body Bathing: Moderate assistance Where Assessed-Upper Body Bathing: Shower Lower Body Bathing: Maximal assistance Where Assessed-Lower Body Bathing: Shower Upper Body Dressing: Dependent, Maximal cueing Where Assessed-Upper Body Dressing: Chair Lower Body Dressing: Dependent Where Assessed-Lower Body Dressing: Sitting at sink, Standing at sink Toileting: Dependent, Maximal cueing Where Assessed-Toileting: Teacher, adult education: Minimal assistance, Maximal verbal cueing Toilet Transfer Method: Stand pivot Acupuncturist: Grab bars Tub/Shower Transfer: Unable to assess Tub/Shower Transfer Method: Unable to assess [employer] Transfer: Minimal assistance, Maximal cueing Film/video editor Method: Stand pivot [employer] Equipment: Transfer tub bench  Therapy/Group: Individual Therapy  Julie Paolini 07/25/2023, 9:25 AM

## 2023-07-25 NOTE — Progress Notes (Signed)
 Physical Therapy Session Note  Patient Details  Name: Kathryn Mcguire MRN: [id number] Date of Birth: 1950/05/31  Today's Date: 07/25/2023 PT Individual Time: 8694-8654 PT Individual Time Calculation (min): 40 min  Today's Date: 07/25/2023 PT Missed Time: 20 Minutes Missed Time Reason: Patient fatigue  Short Term Goals: Week 1:  PT Short Term Goal 1 (Week 1): Pt will ambulate 100 with hand held support PT Short Term Goal 1 - Progress (Week 1): Met PT Short Term Goal 2 (Week 1): Pt will perform 5STS outcmoe measure PT Short Term Goal 2 - Progress (Week 1): Met PT Short Term Goal 3 (Week 1): Pt will perform sit to sidelying with minA PT Short Term Goal 3 - Progress (Week 1): Met PT Short Term Goal 4 (Week 1): Pt will perform car transfer with modA PT Short Term Goal 4 - Progress (Week 1): Met Week 2:  PT Short Term Goal 1 (Week 2): = LTGs  Skilled Therapeutic Interventions/Progress Updates: Patient sitting in WC on entrance to room. Patient alert and agreeable to PT session.   Patient reported no pain. Pt continues to present with decreased motor planning and required max cuing to initiate cues/increase L side awareness throughout session. Pt presented with push to the L as noted below with team updated as this was not present prior.   Therapeutic Activity: Bed Mobility: Pt performed sit<supine from EOB with minA and max cuing to adjust self in bed to center that ultimately required maxA to adjust shoulders/hips.  Transfers: Pt performed sit<>stand transfers throughout session with CGA and presentation of push on R UE off of arm rest.  - Pt ascended 4 (6) steps with LHR use and with CGA/minA to maintain standing balance, but maxA to avoid running close to L railing. Pt required increased time/effort to pivot around at top of stairs to step back down and started to push off ground with R LE, and L UE on railing, even when PTA provided HHA, pt still pushing and ultimately required + 2 to  assist down safely with modA and pt still pushing heavily with R UE on railing. PTA assisting to advance B LE's down steps with pt requiring increased time to motor plan movement/initiate. Pt also cued to increase upright posture vs forward flexed.   Neuromuscular Re-ed: - Pt visually tracking in main gym and hallway to locate colored cones with pt continuing to require maxA to safely navigate area (minA/CGA to maintain standing balance) due to poor L sided awareness and drift to L. Pt required increased time to locate cones, and required recall of cue to keep cones in hand to stack as pt found them. - Pt cued to take colored bean bag and place on matching colored disc on floor. Pt required max cuing to initiate pivot to find bags on table, and then to pivot again to find disc. Pt cued to locate disc on the L with pt stating there was no disc. Pt cued to increase L visual field by turning body, but pt sat on mat. Pt encouraged to push through intervention, but pt reported fatigue and said I am done. Pt transported back to room in [facility] dependently.   NMR performed for improvements in motor control and coordination, balance, sequencing, judgement, and self confidence/ efficacy in performing all aspects of mobility at highest level of independence.   Patient supine in bed at end of session with brakes locked, bed alarm set, and all needs within reach.  Therapy Documentation Precautions:  Precautions Precautions: Fall Recall of Precautions/Restrictions: Impaired Precaution/Restrictions Comments: L hemiplegia, h/o dementia Restrictions Weight Bearing Restrictions Per Provider Order: No   Therapy/Group: Individual Therapy  Alizia Greif PTA 07/25/2023, 3:24 PM

## 2023-07-26 LAB — GLUCOSE, CAPILLARY
Glucose-Capillary: 144 mg/dL — ABNORMAL HIGH (ref 70–99)
Glucose-Capillary: 190 mg/dL — ABNORMAL HIGH (ref 70–99)
Glucose-Capillary: 209 mg/dL — ABNORMAL HIGH (ref 70–99)

## 2023-07-26 MED ORDER — SODIUM CHLORIDE 0.9 % IV SOLN: INTRAVENOUS | Status: AC

## 2023-07-26 NOTE — Progress Notes (Signed)
 Speech Language Pathology Daily Session Note  Patient Details  Name: Kathryn Mcguire MRN: [id number] Date of Birth: 1950/05/31  Today's Date: 07/26/2023 SLP Individual Time: 8499-8471 SLP Individual Time Calculation (min): 28 min  Short Term Goals: Week 2: SLP Short Term Goal 1 (Week 2): STGs= LTGS due to ELOS  Skilled Therapeutic Interventions: Skilled therapy session focused on family education. SLP educated patients family (daughters) on patients current ST goals and POC. SLP reviewed patients L inattention and discussed handouts provided by OT. SLP provided examples of activities to complete at home to encourage sustained attention including I Spy and word searches. SLP reviewed patients current diet recommendations and provided handout with recommended textures (D2/thin). SLP utilized visual aid to explain basic oropharyngeal anatomy and physiology to aid in understanding. SLP then facilitated completion of months sequencing task to target L attention. Patient completed with modA and verbal encouragement from family. Family verbalized understanding of education provided with no further questions. Patient left in chair with alarm set and call bell in reach. Continue POC.   Pain None reported   Therapy/Group: Individual Therapy  Bonnie Roig M.A., CCC-SLP 07/26/2023, 7:38 AM

## 2023-07-26 NOTE — Progress Notes (Signed)
 Occupational Therapy Session Note  Patient Details  Name: Kathryn Mcguire MRN: [id number] Date of Birth: 07/23/1950  Today's Date: 07/26/2023 OT Individual Time: 0850-0948+1306-1401 OT Individual Time Calculation (min): 58 min    Short Term Goals: Week 2:  OT Short Term Goal 1 (Week 2): STG=LTG d/t ELOS  Skilled Therapeutic Interventions/Progress Updates:  Session 1: Pt greeted supine in bed, pt agreeable to OT intervention.      Transfers/bed mobility/functional mobility:  Pt completed supine>sit with MIN A but mod mutlimodal cues for initation and sequnencing bed mobility.  Pt completed sit>stands with CGA but MOD cues for initiation of task. Pt completed functional ambulation with no AD and CGA- MINA.   Therapeutic activity:  Pt completed various visual scanning tasks with  pt instrucetd to scan R<>L to match colors in sitting/standing. Pt noted to have increased difficulty with initiating all tasks but did complete tasks with 100% accuracy with + time.    ADLs:  Grooming: pt completed seated oral care at sink with set- up assist. Max Cues to sequence through steps LB dressing: pt donned pants from EOB with MODA, assisted needed to thread LLE but pt did assist with threading RLE and pulling pants to waist in standing.    NMR:  Pt completed lateal leans to LUE for NMRE to retrieve squigz positioned in chair with a focus on weightbearing into affected UE. Pt completed task with CGA but needed MAX cues to attend to task and for initiation.   Ended session with pt seated in w/c with all needs within reach and safety belt alarm activated.                    Session 2: Pt greeted seated in w/c, pt agreeable to OT intervention.      Pts daughters present for family education. Education provided on the below topics:   -always using gait belt for functional mobility -recommendation on use of hand held assist for functional ambulation - general assist needed for ADLS and  functional mobility ( I.e MODA for ADLS ) -general education provided on L neglect and LUE hemiplegia; handouts provided to increase carryover -education provided on appropriate cues needed I.e multimodal cues at times d/t impaired initiation  -decreasing clutter/fall hazards in the home especially on L isde - discussed DME needs, family wanting TTB and BSC. Discussed w/c vs transport chair still TBD. Pt has regular bed at home, hospital bed possibility still pending.  -education provided on shower transfers to TTB. Pt required MAX cues to complete transfer to TTB as this was her first time completing novel transfer. Recommended grab bars for shower as well as hand held shower head.  - pt completed ambulatory transfer to flat [facility] with pt needed MAX multimodal cues to transition into sidelying vs laying straight back on bed.  -recommended timed toileting at home as pt has been incontinent and continued use of briefs at all times -education provided on pts fluctuating level of care needed d/t dementia and recommendations for when pt may experience more challenging days.   Family seemed to be most concerned about providing 24/7 care mostly at night time as daughter reports pt would get up in the middle of the night. Education provided on use of baby monitors to monitor pt at night or use of BSC for toileting needs in the night.   Pt did have bowel incontinence during session with pt needing MAX A for 3/3 toileting tasks for cleanliness. Pts daughter was  able to walk with her in the hallway with education provided on use of gait belt as needed and importance of cueing pt to attend to L side during gait.    Ended session with pt and family seated in apt awaiting PT arrival.   Therapy Documentation Precautions:  Precautions Precautions: Fall Recall of Precautions/Restrictions: Impaired Precaution/Restrictions Comments: L hemiplegia, h/o dementia Restrictions Weight Bearing Restrictions Per Provider  Order: No  Pain: No indications of pain during either session    Therapy/Group: Individual Therapy  [name] [facility] 07/26/2023, 12:08 PM

## 2023-07-26 NOTE — Progress Notes (Signed)
 Physical Therapy Session Note  Patient Details  Name: Kathryn Mcguire MRN: [id number] Date of Birth: 05-01-1950  Today's Date: 07/26/2023 PT Individual Time: 1400-1500 PT Individual Time Calculation (min): 60 min   Short Term Goals: Week 1:  PT Short Term Goal 1 (Week 1): Pt will ambulate 100 with hand held support PT Short Term Goal 1 - Progress (Week 1): Met PT Short Term Goal 2 (Week 1): Pt will perform 5STS outcmoe measure PT Short Term Goal 2 - Progress (Week 1): Met PT Short Term Goal 3 (Week 1): Pt will perform sit to sidelying with minA PT Short Term Goal 3 - Progress (Week 1): Met PT Short Term Goal 4 (Week 1): Pt will perform car transfer with modA PT Short Term Goal 4 - Progress (Week 1): Met Week 2:  PT Short Term Goal 1 (Week 2): = LTGs  Skilled Therapeutic Interventions/Progress Updates:     Pt in apartment when PT arrived.  Pt with daughters for family education session.  Pt agreeable for PT.  PT discussed discharge planning with family.  Reviewed home/functional/mobility safety, placement concerns post d/c.  Pt performed stair navigation with x1 HHA and x1 railing for support.  Pt requires moderate cueing for sequencing, staying on task, placement of LE throughout.  Family able to plan pt's entering/exiting home with x1-2 steps max through the kitchen. Family discussed possibility of putting hand rail for patient.  Pt ambulated throughout facility with x1 HHA ~x350', cueing to correct spatial neglect.  Pt performed LE ex's, balance tx ex's in standing on non compliant surface with min/mod assist for LOB initially, pt able to stand short time due to lack of focus. Pt returned to room to w/c with family present, items within reach.   Therapy Documentation Precautions:  Precautions Precautions: Fall Recall of Precautions/Restrictions: Impaired Precaution/Restrictions Comments: L hemiplegia, h/o dementia Restrictions Weight Bearing Restrictions Per Provider Order:  No  Pain: Pt denies pain      Therapy/Group: Individual Therapy  Arland GORMAN Fast 07/26/2023, 4:29 PM

## 2023-07-26 NOTE — Progress Notes (Signed)
 PROGRESS NOTE   Subjective/Complaints:  No issues overnite per pt, states she is drinking and has good appetite, oriented to person , place , month but not day/date Documented fluids only yesterday  ROS: Patient denies CP, SOB  Objective:   No results found. Recent Labs    07/25/23 0458  WBC 8.2  HGB 12.5  HCT 39.0  PLT 314    Recent Labs    07/23/23 1845  NA 135  K 4.3  CL 98  CO2 25  GLUCOSE 161*  BUN 16  CREATININE 0.99  CALCIUM  9.9     Intake/Output Summary (Last 24 hours) at 07/26/2023 0747 Last data filed at 07/25/2023 1235 Gross per 24 hour  Intake 296 ml  Output --  Net 296 ml        Physical Exam: Vital Signs Blood pressure (!) 144/67, pulse 61, temperature (!) 97.5 F (36.4 C), resp. rate 17, height 5' 3 (1.6 m), weight 57.7 kg, SpO2 99%.   General: No acute distress Mood and affect are appropriate Heart: Regular rate and rhythm no rubs murmurs or extra sounds Lungs: Clear to auscultation, breathing unlabored, no rales or wheezes Abdomen: Positive bowel sounds, soft nontender to palpation, nondistended Extremities: No clubbing, cyanosis, or edema Skin: No evidence of breakdown, no evidence of rash   Neurologic:Oriented to person, [facility], stroke. Cranial nerves II through XII intact, motor strength is 4/5 in Right hand. 0/5 left deltoid, bicep, tricep, grip. Did spontaneously move left hip flexors, knee extensors, ankle dorsiflexor and plantar flexors but was not consistent during MMT. She  exhibits early tone in LUE: tr pecs, 1 biceps, tr wrist flexors.  Sensory exam normal sensation to light touch and proprioception in bilateral upper and lower extremities Cerebellar exam normal finger to nose to finger as well as heel to shin in bilateral upper and lower extremities Musculoskeletal: Full range of motion in all 4 extremities. No joint swelling    Assessment/Plan: 1. Functional  deficits which require 3+ hours per day of interdisciplinary therapy in a comprehensive inpatient rehab setting. Physiatrist is providing close team supervision and 24 hour management of active medical problems listed below. Physiatrist and rehab team continue to assess barriers to discharge/monitor patient progress toward functional and medical goals  Care Tool:  Bathing    Body parts bathed by patient: Face, Chest, Front perineal area, Abdomen   Body parts bathed by helper: Right arm, Left arm, Right upper leg, Left upper leg, Right lower leg, Left lower leg, Buttocks     Bathing assist Assist Level: Maximal Assistance - Patient 24 - 49%     Upper Body Dressing/Undressing Upper body dressing   What is the patient wearing?: Bra, Pull over shirt    Upper body assist Assist Level: Total Assistance - Patient < 25%    Lower Body Dressing/Undressing Lower body dressing      What is the patient wearing?: Pants, Underwear/pull up     Lower body assist Assist for lower body dressing: Total Assistance - Patient < 25%     Toileting Toileting Toileting Activity did not occur (Clothing management and hygiene only): N/A (no void or bm)  Toileting assist  Assist for toileting: Total Assistance - Patient < 25%     Transfers Chair/bed transfer  Transfers assist  Chair/bed transfer activity did not occur: Safety/medical concerns  Chair/bed transfer assist level: Minimal Assistance - Patient > 75%     Locomotion Ambulation   Ambulation assist      Assist level: Minimal Assistance - Patient > 75% Assistive device: Hand held assist Max distance: 100'   Walk 10 feet activity   Assist     Assist level: Contact Guard/Touching assist Assistive device: Hand held assist   Walk 50 feet activity   Assist    Assist level: Contact Guard/Touching assist Assistive device: Hand held assist    Walk 150 feet activity   Assist Walk 150 feet activity did not occur:  Safety/medical concerns         Walk 10 feet on uneven surface  activity   Assist Walk 10 feet on uneven surfaces activity did not occur: Safety/medical concerns         Wheelchair     Assist Is the patient using a wheelchair?: Yes Type of Wheelchair: Manual    Wheelchair assist level: Dependent - Patient 0%      Wheelchair 50 feet with 2 turns activity    Assist        Assist Level: Dependent - Patient 0%   Wheelchair 150 feet activity     Assist      Assist Level: Dependent - Patient 0%   Blood pressure (!) 144/67, pulse 61, temperature (!) 97.5 F (36.4 C), resp. rate 17, height 5' 3 (1.6 m), weight 57.7 kg, SpO2 99%.  Medical Problem List and Plan: 1. 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  Antithrombotics: -DVT/anticoagulation:  Pharmaceutical: Lovenox  40mg  daily             -antiplatelet therapy: DAPT X 3 months followed by ASA alone.  3. Pain Management/spasticity: tylenol  prn  -7/15 pt with early flexor tone of LUE. It is mild, and I think at this point, general ROM and positioning is adequate.    -will order WHO for LUE 4. Mood/Behavior/Sleep: LCSW to follow for evaluation and support.              --does get up 3-4 times every night (food/tobacco)             -antipsychotic agents: N/a 5. Neuropsych/cognition: This patient is not capable of making decisions on her own behalf. 6. Skin/Wound Care: Routine pressure relief measures.  7. Fluids/Electrolytes/Nutrition: Monitor I/O. Family reports great appetite-->eats all day long. Eating 100% Order IVF if intake <1073mL  8.  T2DM: Hgb A1c- 12.3. Monitor BS ac/hs and use SSI for elevated BS             --Was on 70/30 insulin  6 units bid and metformin  1000 mg bid  -Will  restart 70/30 insulin  at 6mg  bid on 7/10, improved  -will add glyburide  and dc metformin  given recent loose stool -7/15 CBG low this am. Will hold PM glyburide         CBG (last 3)  Recent Labs    07/25/23 1631 07/25/23 2159 07/26/23 0552  GLUCAP 134* 154*  144*  Controlled 7/18 9. 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  -continue supplements  10. H/o bipolar d/o: Used to have manic episodes/wander, etc but stable off medications.   11. Hyperlipidemia: Continue increased dose lipitor 40mg  daily  12. Dementia: On Aricept  5mg  nightly (followed by Dr. Georjean in the past) -per daughters pt was generally independent within the home, needing  supervision with bathing and some ADL's for safety.   13. COPD/Tobacco use: Chews 4 packs of tobacco daily.    No cravings  14. Blood pressure: Monitor TID--keep SBP > 110. --avoid hypotension given R-ICA occlusion.  May d/c IVF after current bag -7/15 bp's soft--seems to be tolerating---no changes for now -push fluids.  Vitals:   07/22/23 1437 07/22/23 1940 07/23/23 0620 07/23/23 1537  BP: 115/68 114/64 131/65 (!) 139/123   07/23/23 2000 07/24/23 0443 07/24/23 0800 07/24/23 1306  BP: 107/71 (!) 141/78 110/67 103/80   07/25/23 0502 07/25/23 1352 07/25/23 2038 07/26/23 0520  BP: 122/72 102/89 129/72 (!) 144/67    15. Mild leucocytosis: resolved    Latest Ref Rng & Units 07/25/2023    4:58 AM 07/22/2023    5:15 AM 07/21/2023    7:24 AM  CBC  WBC 4.0 - 10.5 K/uL 8.2  9.0  8.1   Hemoglobin 12.0 - 15.0 g/dL 87.4  88.0  87.1   Hematocrit 36.0 - 46.0 % 39.0  36.8  39.7   Platelets 150 - 400 K/uL 314  290  266     16. Mild hyponatremia: resolved     Latest Ref Rng & Units 07/23/2023    6:45 PM 07/21/2023    7:24 AM 07/17/2023    6:00 AM  BMP  Glucose 70 - 99 mg/dL 838  887  779   BUN 8 - 23 mg/dL 16  14  19    Creatinine 0.44 - 1.00 mg/dL 9.00  9.33  9.34   Sodium 135 - 145  mmol/L 135  135  134   Potassium 3.5 - 5.1 mmol/L 4.3  3.9  4.6   Chloride 98 - 111 mmol/L 98  103  101   CO2 22 - 32 mmol/L 25  21  23    Calcium  8.9 - 10.3 mg/dL 9.9  9.1  9.3     17. Diarrhea:  -07/20/23 pt having multiple episodes of diarrhea over last few days, imodium  added, labs for tomorrow.  -unclear etiology, doubt c.diff but consider checking if continued/symptoms change -could be from nicotine  withdrawal? Or if metformin  was just recently added back? -7/13-14/25 diarrhea stopped with one dose of imodium  7/12 -labs unremarkable.  -stopped metformin      LOS: 10 days A FACE TO FACE EVALUATION WAS PERFORMED  Prentice FORBES Compton 07/26/2023, 7:47 AM

## 2023-07-26 NOTE — Progress Notes (Signed)
 Patient ID: Kathryn Mcguire, female   DOB: 05-Oct-1950, 73 y.o.   MRN: [id number]  SW met with pt and pt daughters in room to answer questions and review all options. Family will discuss best plan of care.    Graeme Jude, MSW, LCSW Office: [phone] Cell: [phone] Fax: [phone]

## 2023-07-27 LAB — GLUCOSE, CAPILLARY
Glucose-Capillary: 171 mg/dL — ABNORMAL HIGH (ref 70–99)
Glucose-Capillary: 136 mg/dL — ABNORMAL HIGH (ref 70–99)
Glucose-Capillary: 164 mg/dL — ABNORMAL HIGH (ref 70–99)
Glucose-Capillary: 220 mg/dL — ABNORMAL HIGH (ref 70–99)

## 2023-07-27 NOTE — Progress Notes (Signed)
 Occupational Therapy Session Note  Patient Details  Name: Kathryn Mcguire MRN: [id number] Date of Birth: 01/11/1950  Today's Date: 07/27/2023 OT Individual Time: 9169-9059 OT Individual Time Calculation (min): 70 min    Short Term Goals: Week 1:  OT Short Term Goal 1 (Week 1): Patient will trasnfer to toilet with mod assist OT Short Term Goal 1 - Progress (Week 1): Met OT Short Term Goal 2 (Week 1): Patient will complete clothing management during toileting wiht mod assist OT Short Term Goal 2 - Progress (Week 1): Not met OT Short Term Goal 3 (Week 1): Patient will don pull over shirt with min assist following set up OT Short Term Goal 3 - Progress (Week 1): Not met OT Short Term Goal 4 (Week 1): Patient will visually locate needed ADL items left of midline 5* with min cueing OT Short Term Goal 4 - Progress (Week 1): Not met Week 2:  OT Short Term Goal 1 (Week 2): STG=LTG d/t ELOS   Skilled Therapeutic Interventions/Progress Updates:    Pt bed level at time of session, stated no pain. Pt with very limited initiation throughout session, needing simple one step commands, multimodal cues, and extended time with poor initiation and sequencing. Brief clean at beginning of session and declined toileting, supine > sit MIN A and stand pivot with HHA MIN A as well 2/2 prematurely sitting. UB/LB bathe at sink level as pt noted to have urine smell, MIN for UB and MAX for LB, needing step by step cues and instruction for finishing tasks once started. UB dressing total A for doffing clothing and MAX for donning new shirt with hemitechnique, MAX for LB as well. Oral hygiene with supervision, washing face same manner. OT attempting to have the pt perform AROM, but difficulty following commands and given dense hemi - switched to PROM for tone management and OT guiding pt's LUE through shoulder flex/ext, circles, ER (some discomfort so did to tolerance), elbow ROM, forearm sup/pro, and wrist. Positioned  with pillow, up in wheelchair alarm on call bell in reach.   Therapy Documentation Precautions:  Precautions Precautions: Fall Recall of Precautions/Restrictions: Impaired Precaution/Restrictions Comments: L hemiplegia, h/o dementia Restrictions Weight Bearing Restrictions Per Provider Order: No    Therapy/Group: Individual Therapy  Chiquita JAYSON Hopping 07/27/2023, 7:14 AM

## 2023-07-27 NOTE — Progress Notes (Signed)
 PROGRESS NOTE   Subjective/Complaints:  Pt doing ok, slept well, denies pain, unsure of LBM but looks like it was yesterday. Urinating fine (incontinent). No other complaints or concerns but cognition might limit exam.   ROS: Patient denies CP, SOB, abd pain, n/v/d but may be limited d/t cognition.   Objective:   No results found. Recent Labs    07/25/23 0458  WBC 8.2  HGB 12.5  HCT 39.0  PLT 314    No results for input(s): NA, K, CL, CO2, GLUCOSE, BUN, CREATININE, CALCIUM  in the last 72 hours.    Intake/Output Summary (Last 24 hours) at 07/27/2023 1322 Last data filed at 07/27/2023 1304 Gross per 24 hour  Intake 629.08 ml  Output --  Net 629.08 ml        Physical Exam: Vital Signs Blood pressure 116/74, pulse 71, temperature 98.4 F (36.9 C), temperature source Oral, resp. rate 18, height 5' 3 (1.6 m), weight 55.9 kg, SpO2 100%.   General: No acute distress, sitting up in w/c Mood and affect are appropriate Heart: Regular rate and rhythm no rubs murmurs or extra sounds Lungs: Clear to auscultation, breathing unlabored, no rales or wheezes Abdomen: Positive bowel sounds, soft nontender to palpation, nondistended Extremities: No clubbing, cyanosis, or edema Skin: No evidence of breakdown, no evidence of rash  PRIOR EXAMS: Neurologic:Oriented to person, [facility], stroke. Cranial nerves II through XII intact, motor strength is 4/5 in Right hand. 0/5 left deltoid, bicep, tricep, grip. Did spontaneously move left hip flexors, knee extensors, ankle dorsiflexor and plantar flexors but was not consistent during MMT. She  exhibits early tone in LUE: tr pecs, 1 biceps, tr wrist flexors.  Sensory exam normal sensation to light touch and proprioception in bilateral upper and lower extremities Cerebellar exam normal finger to nose to finger as well as heel to shin in bilateral upper and lower  extremities Musculoskeletal: Full range of motion in all 4 extremities. No joint swelling    Assessment/Plan: 1. Functional deficits which require 3+ hours per day of interdisciplinary therapy in a comprehensive inpatient rehab setting. Physiatrist is providing close team supervision and 24 hour management of active medical problems listed below. Physiatrist and rehab team continue to assess barriers to discharge/monitor patient progress toward functional and medical goals  Care Tool:  Bathing    Body parts bathed by patient: Face, Chest, Front perineal area, Abdomen   Body parts bathed by helper: Right arm, Left arm, Right upper leg, Left upper leg, Right lower leg, Left lower leg, Buttocks     Bathing assist Assist Level: Maximal Assistance - Patient 24 - 49%     Upper Body Dressing/Undressing Upper body dressing   What is the patient wearing?: Pull over shirt    Upper body assist Assist Level: Total Assistance - Patient < 25%    Lower Body Dressing/Undressing Lower body dressing      What is the patient wearing?: Pants     Lower body assist Assist for lower body dressing: Moderate Assistance - Patient 50 - 74%     Toileting Toileting Toileting Activity did not occur (Clothing management and hygiene only): N/A (no void or bm)  Toileting  assist Assist for toileting: Total Assistance - Patient < 25%     Transfers Chair/bed transfer  Transfers assist  Chair/bed transfer activity did not occur: Safety/medical concerns  Chair/bed transfer assist level: Contact Guard/Touching assist     Locomotion Ambulation   Ambulation assist      Assist level: Minimal Assistance - Patient > 75% Assistive device: Hand held assist Max distance: >250'   Walk 10 feet activity   Assist     Assist level: Contact Guard/Touching assist Assistive device: Hand held assist   Walk 50 feet activity   Assist    Assist level: Contact Guard/Touching assist Assistive  device: Hand held assist    Walk 150 feet activity   Assist Walk 150 feet activity did not occur: Safety/medical concerns  Assist level: Contact Guard/Touching assist Assistive device: Hand held assist    Walk 10 feet on uneven surface  activity   Assist Walk 10 feet on uneven surfaces activity did not occur: Safety/medical concerns         Wheelchair     Assist Is the patient using a wheelchair?: Yes Type of Wheelchair: Manual    Wheelchair assist level: Dependent - Patient 0%      Wheelchair 50 feet with 2 turns activity    Assist        Assist Level: Dependent - Patient 0%   Wheelchair 150 feet activity     Assist      Assist Level: Dependent - Patient 0%   Blood pressure 116/74, pulse 71, temperature 98.4 F (36.9 C), temperature source Oral, resp. rate 18, height 5' 3 (1.6 m), weight 55.9 kg, SpO2 100%.  Medical Problem List and Plan: 1. 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  Antithrombotics: -DVT/anticoagulation:  Pharmaceutical: Lovenox  40mg  daily             -antiplatelet therapy: DAPT X 3 months followed by ASA alone.  3. Pain Management/spasticity: tylenol  prn -7/15 pt with early flexor tone of LUE. It is mild, and I think at this point, general ROM and positioning is adequate.    -will order WHO for LUE 4. Mood/Behavior/Sleep: LCSW to follow for evaluation and support.              --does get up 3-4 times every night (food/tobacco)             -antipsychotic agents: N/a 5. Neuropsych/cognition: This patient is not capable of making decisions on her own behalf. 6. Skin/Wound Care: Routine pressure relief measures.  7. Fluids/Electrolytes/Nutrition: Monitor I/O. Family reports great appetite-->eats all day long.  Eating 100% Order IVF if intake <1018mL  8.  T2DM: Hgb A1c- 12.3. Monitor BS ac/hs and use SSI for elevated BS             --Was on 70/30 insulin  6 units bid and metformin  1000 mg bid  -Will restart 70/30 insulin  at 6u bid on 7/10, improved  -will add glyburide  2.5mg  daily and dc metformin  given recent loose stool -7/15 CBG low this am. Will hold PM glyburide   -07/27/23 CBGs pretty good, monitor       CBG (last 3)  Recent Labs  07/26/23 1631 07/27/23 0823 07/27/23 1147  GLUCAP 209* 171* 136*   9. 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  -continue supplements  10. H/o bipolar d/o: Used to have manic episodes/wander, etc but stable off medications.   11. Hyperlipidemia: Continue increased dose lipitor 40mg  daily  12. Dementia: On Aricept  5mg  nightly (followed by Dr. Georjean in the past) -per daughters pt was generally independent within the home, needing  supervision with bathing and some ADL's for safety.   13. COPD/Tobacco use: Chews 4 packs of tobacco daily.    No cravings  14. Blood pressure: Monitor TID--keep SBP > 110. --avoid hypotension given R-ICA occlusion.  May d/c IVF after current bag -7/15 bp's soft--seems to be tolerating---no changes for now -push fluids.  -07/27/23 BPs pretty good, monitor Vitals:   07/23/23 1537 07/23/23 2000 07/24/23 0443 07/24/23 0800  BP: (!) 139/123 107/71 (!) 141/78 110/67   07/24/23 1306 07/25/23 0502 07/25/23 1352 07/25/23 2038  BP: 103/80 122/72 102/89 129/72   07/26/23 0520 07/26/23 1544 07/26/23 2017 07/27/23 0643  BP: (!) 144/67 123/62 (!) 105/59 116/74    15. Mild leucocytosis: resolved    Latest Ref Rng & Units 07/25/2023    4:58 AM 07/22/2023    5:15 AM 07/21/2023    7:24 AM  CBC  WBC 4.0 - 10.5 K/uL 8.2  9.0  8.1   Hemoglobin 12.0 - 15.0 g/dL 87.4  88.0  87.1   Hematocrit 36.0 - 46.0 % 39.0  36.8  39.7   Platelets 150 - 400 K/uL 314  290  266     16.  Mild hyponatremia: resolved     Latest Ref Rng & Units 07/23/2023    6:45 PM 07/21/2023    7:24 AM 07/17/2023    6:00 AM  BMP  Glucose 70 - 99 mg/dL 838  887  779   BUN 8 - 23 mg/dL 16  14  19    Creatinine 0.44 - 1.00 mg/dL 9.00  9.33  9.34   Sodium 135 - 145 mmol/L 135  135  134   Potassium 3.5 - 5.1 mmol/L 4.3  3.9  4.6   Chloride 98 - 111 mmol/L 98  103  101   CO2 22 - 32 mmol/L 25  21  23    Calcium  8.9 - 10.3 mg/dL 9.9  9.1  9.3     17. Diarrhea:  -07/20/23 pt having multiple episodes of diarrhea over last few days, imodium  added, labs for tomorrow.  -unclear etiology, doubt c.diff but consider checking if continued/symptoms change -could be from nicotine  withdrawal? Or if metformin  was just recently added back? -7/13-14/25 diarrhea stopped with one dose of imodium  7/12 -labs unremarkable.  -stopped metformin   -07/27/23 LBM last night, type 4, has 4-5/day but don't seem to be diarrhea anymore. Monitor    LOS: 11 days A FACE TO FACE EVALUATION WAS PERFORMED  [address] Lashaya Kienitz 07/27/2023, 1:22 PM

## 2023-07-27 NOTE — Progress Notes (Signed)
 Physical Therapy Session Note  Patient Details  Name: Kathryn Mcguire MRN: [id number] Date of Birth: Jun 06, 1950  Today's Date: 07/27/2023 PT Individual Time: 1345-1410 PT Individual Time Calculation (min): 25 min   Short Term Goals: Week 2:  PT Short Term Goal 1 (Week 2): = LTGs  Skilled Therapeutic Interventions/Progress Updates:    Chart reviewed and pt agreeable to therapy. Pt received seated in WC with no c/o pain. Session focused on functional transfers, amb, and L sided attention to promote safe home access. Pt initiated session with SPT to EOB from WC using CGA + no AD. Pt then completed multiple rounds of amb of 45ft + 62ft + 128ft using CGA + no AD. Pt then sat EOB and attempted finding objects on L, though pt required strong VC. At end of session, pt was left seated EOB with alarm engaged, nurse call bell and all needs in reach.     Therapy Documentation Precautions:  Precautions Precautions: Fall Recall of Precautions/Restrictions: Impaired Precaution/Restrictions Comments: L hemiplegia, h/o dementia Restrictions Weight Bearing Restrictions Per Provider Order: No General:       Therapy/Group: Individual Therapy  Dell Briner G Haik Mahoney, PT, DPT 07/27/2023, 2:14 PM

## 2023-07-28 LAB — GLUCOSE, CAPILLARY
Glucose-Capillary: 148 mg/dL — ABNORMAL HIGH (ref 70–99)
Glucose-Capillary: 141 mg/dL — ABNORMAL HIGH (ref 70–99)
Glucose-Capillary: 140 mg/dL — ABNORMAL HIGH (ref 70–99)
Glucose-Capillary: 205 mg/dL — ABNORMAL HIGH (ref 70–99)

## 2023-07-28 NOTE — Progress Notes (Signed)
 PROGRESS NOTE   Subjective/Complaints:  Pt doing fine, slept well, denies pain, unsure of LBM but looks like it was yesterday. Urinating fine (incontinent). No other complaints or concerns but cognition might limit exam.   ROS: Patient denies CP, SOB, abd pain, n/v/d but may be limited d/t cognition.   Objective:   No results found. No results for input(s): WBC, HGB, HCT, PLT in the last 72 hours.   No results for input(s): NA, K, CL, CO2, GLUCOSE, BUN, CREATININE, CALCIUM  in the last 72 hours.    Intake/Output Summary (Last 24 hours) at 07/28/2023 1302 Last data filed at 07/28/2023 1252 Gross per 24 hour  Intake 500 ml  Output --  Net 500 ml        Physical Exam: Vital Signs Blood pressure 108/68, pulse 61, temperature 97.7 F (36.5 C), temperature source Oral, resp. rate 17, height 5' 3 (1.6 m), weight 56.1 kg, SpO2 100%.   General: No acute distress, resting comfortably in bed.  Mood and affect are appropriate Heart: Regular rate and rhythm no rubs murmurs or extra sounds Lungs: Clear to auscultation, breathing unlabored, no rales or wheezes Abdomen: Positive bowel sounds, soft nontender to palpation, nondistended Extremities: No clubbing, cyanosis, or edema Skin: No evidence of breakdown, no evidence of rash  PRIOR EXAMS: Neurologic:Oriented to person, [facility], stroke. Cranial nerves II through XII intact, motor strength is 4/5 in Right hand. 0/5 left deltoid, bicep, tricep, grip. Did spontaneously move left hip flexors, knee extensors, ankle dorsiflexor and plantar flexors but was not consistent during MMT. She  exhibits early tone in LUE: tr pecs, 1 biceps, tr wrist flexors.  Sensory exam normal sensation to light touch and proprioception in bilateral upper and lower extremities Cerebellar exam normal finger to nose to finger as well as heel to shin in bilateral upper and lower  extremities Musculoskeletal: Full range of motion in all 4 extremities. No joint swelling    Assessment/Plan: 1. Functional deficits which require 3+ hours per day of interdisciplinary therapy in a comprehensive inpatient rehab setting. Physiatrist is providing close team supervision and 24 hour management of active medical problems listed below. Physiatrist and rehab team continue to assess barriers to discharge/monitor patient progress toward functional and medical goals  Care Tool:  Bathing    Body parts bathed by patient: Face, Chest, Front perineal area, Abdomen   Body parts bathed by helper: Right arm, Left arm, Right upper leg, Left upper leg, Right lower leg, Left lower leg, Buttocks     Bathing assist Assist Level: Maximal Assistance - Patient 24 - 49%     Upper Body Dressing/Undressing Upper body dressing   What is the patient wearing?: Pull over shirt    Upper body assist Assist Level: Total Assistance - Patient < 25%    Lower Body Dressing/Undressing Lower body dressing      What is the patient wearing?: Pants     Lower body assist Assist for lower body dressing: Moderate Assistance - Patient 50 - 74%     Toileting Toileting Toileting Activity did not occur (Clothing management and hygiene only): N/A (no void or bm)  Toileting assist Assist for toileting: Total Assistance -  Patient < 25%     Transfers Chair/bed transfer  Transfers assist  Chair/bed transfer activity did not occur: Safety/medical concerns  Chair/bed transfer assist level: Contact Guard/Touching assist     Locomotion Ambulation   Ambulation assist      Assist level: Minimal Assistance - Patient > 75% Assistive device: Hand held assist Max distance: >250'   Walk 10 feet activity   Assist     Assist level: Contact Guard/Touching assist Assistive device: Hand held assist   Walk 50 feet activity   Assist    Assist level: Contact Guard/Touching assist Assistive  device: Hand held assist    Walk 150 feet activity   Assist Walk 150 feet activity did not occur: Safety/medical concerns  Assist level: Contact Guard/Touching assist Assistive device: Hand held assist    Walk 10 feet on uneven surface  activity   Assist Walk 10 feet on uneven surfaces activity did not occur: Safety/medical concerns         Wheelchair     Assist Is the patient using a wheelchair?: Yes Type of Wheelchair: Manual    Wheelchair assist level: Dependent - Patient 0%      Wheelchair 50 feet with 2 turns activity    Assist        Assist Level: Dependent - Patient 0%   Wheelchair 150 feet activity     Assist      Assist Level: Dependent - Patient 0%   Blood pressure 108/68, pulse 61, temperature 97.7 F (36.5 C), temperature source Oral, resp. rate 17, height 5' 3 (1.6 m), weight 56.1 kg, SpO2 100%.  Medical Problem List and Plan: 1. 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  Antithrombotics: -DVT/anticoagulation:  Pharmaceutical: Lovenox  40mg  daily             -antiplatelet therapy: DAPT X 3 months followed by ASA alone.  3. Pain Management/spasticity: tylenol  prn -7/15 pt with early flexor tone of LUE. It is mild, and I think at this point, general ROM and positioning is adequate.    -will order WHO for LUE 4. Mood/Behavior/Sleep: LCSW to follow for evaluation and support.              --does get up 3-4 times every night (food/tobacco)             -antipsychotic agents: N/a 5. Neuropsych/cognition: This patient is not capable of making decisions on her own behalf. 6. Skin/Wound Care: Routine pressure relief measures.  7. Fluids/Electrolytes/Nutrition: Monitor I/O. Family reports great appetite-->eats all day long.  Eating 100% Order IVF if intake <1049mL  8.  T2DM: Hgb A1c- 12.3. Monitor BS ac/hs and use SSI for elevated BS             --Was on 70/30 insulin  6 units bid and metformin  1000 mg bid  -Will restart 70/30 insulin  at 6u bid on 7/10, improved  -will add glyburide  2.5mg  daily and dc metformin  given recent loose stool -7/15 CBG low this am. Will hold PM glyburide   -7/19-20/25 CBGs pretty good with very occasional 200s reading; monitor       CBG (last 3)  Recent Labs    07/27/23  2138 07/28/23 0732 07/28/23 1221  GLUCAP 220* 148* 141*   9. 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  -continue supplements  10. H/o bipolar d/o: Used to have manic episodes/wander, etc but stable off medications.   11. Hyperlipidemia: Continue increased dose lipitor 40mg  daily  12. Dementia: On Aricept  5mg  nightly (followed by Dr. Georjean in the past) -per daughters pt was generally independent within the home, needing  supervision with bathing and some ADL's for safety.   13. COPD/Tobacco use: Chews 4 packs of tobacco daily.    No cravings  14. Blood pressure: Monitor TID--keep SBP > 110. --avoid hypotension given R-ICA occlusion.  May d/c IVF after current bag -7/15 bp's soft--seems to be tolerating---no changes for now -push fluids.  -7/19-20/25 BPs pretty good, monitor Vitals:   07/24/23 0800 07/24/23 1306 07/25/23 0502 07/25/23 1352  BP: 110/67 103/80 122/72 102/89   07/25/23 2038 07/26/23 0520 07/26/23 1544 07/26/23 2017  BP: 129/72 (!) 144/67 123/62 (!) 105/59   07/27/23 0643 07/27/23 1604 07/27/23 2000 07/28/23 0606  BP: 116/74 (!) 131/91 106/60 108/68    15. Mild leucocytosis: resolved    Latest Ref Rng & Units 07/25/2023    4:58 AM 07/22/2023    5:15 AM 07/21/2023    7:24 AM  CBC  WBC 4.0 - 10.5 K/uL 8.2  9.0  8.1   Hemoglobin 12.0 - 15.0 g/dL 87.4  88.0  87.1   Hematocrit 36.0 - 46.0 % 39.0  36.8  39.7   Platelets 150  - 400 K/uL 314  290  266     16. Mild hyponatremia: resolved     Latest Ref Rng & Units 07/23/2023    6:45 PM 07/21/2023    7:24 AM 07/17/2023    6:00 AM  BMP  Glucose 70 - 99 mg/dL 838  887  779   BUN 8 - 23 mg/dL 16  14  19    Creatinine 0.44 - 1.00 mg/dL 9.00  9.33  9.34   Sodium 135 - 145 mmol/L 135  135  134   Potassium 3.5 - 5.1 mmol/L 4.3  3.9  4.6   Chloride 98 - 111 mmol/L 98  103  101   CO2 22 - 32 mmol/L 25  21  23    Calcium  8.9 - 10.3 mg/dL 9.9  9.1  9.3     17. Diarrhea:  -07/20/23 pt having multiple episodes of diarrhea over last few days, imodium  added, labs for tomorrow.  -unclear etiology, doubt c.diff but consider checking if continued/symptoms change -could be from nicotine  withdrawal? Or if metformin  was just recently added back? -7/13-14/25 diarrhea stopped with one dose of imodium  7/12 -labs unremarkable.  -stopped metformin   -07/27/23 LBM last night, type 4, has 4-5/day but don't seem to be diarrhea anymore. Monitor -07/28/23 LBM yesterday, doing better; monitor    LOS: 12 days A FACE TO FACE EVALUATION WAS PERFORMED  [address] 07/28/2023, 1:02 PM

## 2023-07-28 NOTE — Plan of Care (Signed)
  Problem: Consults Goal: RH STROKE PATIENT EDUCATION Description: See Patient Education module for education specifics  Outcome: Progressing   Problem: RH BOWEL ELIMINATION Goal: RH STG MANAGE BOWEL WITH ASSISTANCE Description: STG Manage Bowel with mod I Assistance. Outcome: Progressing Goal: RH STG MANAGE BOWEL W/MEDICATION W/ASSISTANCE Description: STG Manage Bowel with Medication with mod I Assistance. Outcome: Progressing   Problem: RH BLADDER ELIMINATION Goal: RH STG MANAGE BLADDER WITH ASSISTANCE Description: STG Manage Bladder With toileting /min Assistance Outcome: Progressing   Problem: RH SAFETY Goal: RH STG ADHERE TO SAFETY PRECAUTIONS W/ASSISTANCE/DEVICE Description: STG Adhere to Safety Precautions With supervision Assistance/Device. Outcome: Progressing   Problem: RH KNOWLEDGE DEFICIT Goal: RH STG INCREASE KNOWLEDGE OF DIABETES Description: Patient's dtr will be able to manage DM using educational resources for medications and dietary modification independently Outcome: Progressing Goal: RH STG INCREASE KNOWLEDGE OF HYPERTENSION Description: Patient's dtr will be able to manage HTN using educational resources for medications and dietary modification independently Outcome: Progressing Goal: RH STG INCREASE KNOWLEDGE OF DYSPHAGIA/FLUID INTAKE Description: Patient's dtr will be able to manage Dysphagia using educational resources for medications and dietary modification independently Outcome: Progressing Goal: RH STG INCREASE KNOWLEGDE OF HYPERLIPIDEMIA Description: Patient's dtr will be able to manage HLD using educational resources for medications and dietary modification independently Outcome: Progressing Goal: RH STG INCREASE KNOWLEDGE OF STROKE PROPHYLAXIS Description: Patient's dtr will be able to manage secondary risks using educational resources for medications and dietary modification independently Outcome: Progressing   Problem: Education: Goal: Ability  to describe self-care measures that may prevent or decrease complications (Diabetes Survival Skills Education) will improve Outcome: Progressing Goal: Individualized Educational Video(s) Outcome: Progressing   Problem: Coping: Goal: Ability to adjust to condition or change in health will improve Outcome: Progressing   Problem: Fluid Volume: Goal: Ability to maintain a balanced intake and output will improve Outcome: Progressing   Problem: Health Behavior/Discharge Planning: Goal: Ability to identify and utilize available resources and services will improve Outcome: Progressing Goal: Ability to manage health-related needs will improve Outcome: Progressing   Problem: Metabolic: Goal: Ability to maintain appropriate glucose levels will improve Outcome: Progressing   Problem: RH Vision Goal: RH LTG Vision (Specify) Outcome: Progressing   Problem: Consults Goal: RH STROKE PATIENT EDUCATION Description: See Patient Education module for education specifics  Outcome: Progressing   Problem: RH BOWEL ELIMINATION Goal: RH STG MANAGE BOWEL WITH ASSISTANCE Description: STG Manage Bowel with mod I Assistance. Outcome: Progressing   Problem: RH BOWEL ELIMINATION Goal: RH STG MANAGE BOWEL W/MEDICATION W/ASSISTANCE Description: STG Manage Bowel with Medication with mod I Assistance. Outcome: Progressing   Problem: RH BLADDER ELIMINATION Goal: RH STG MANAGE BLADDER WITH ASSISTANCE Description: STG Manage Bladder With toileting /min Assistance Outcome: Progressing   Problem: RH SAFETY Goal: RH STG ADHERE TO SAFETY PRECAUTIONS W/ASSISTANCE/DEVICE Description: STG Adhere to Safety Precautions With supervision Assistance/Device. Outcome: Progressing   Problem: RH KNOWLEDGE DEFICIT Goal: RH STG INCREASE KNOWLEDGE OF DIABETES Description: Patient's dtr will be able to manage DM using educational resources for medications and dietary modification independently Outcome: Progressing

## 2023-07-28 NOTE — Plan of Care (Signed)
  Problem: RH BLADDER ELIMINATION Goal: RH STG MANAGE BLADDER WITH ASSISTANCE Description: STG Manage Bladder With toileting /min Assistance Outcome: Progressing   Problem: RH SAFETY Goal: RH STG ADHERE TO SAFETY PRECAUTIONS W/ASSISTANCE/DEVICE Description: STG Adhere to Safety Precautions With supervision Assistance/Device. Outcome: Progressing   Problem: RH KNOWLEDGE DEFICIT Goal: RH STG INCREASE KNOWLEDGE OF STROKE PROPHYLAXIS Description: Patient's dtr will be able to manage secondary risks using educational resources for medications and dietary modification independently Outcome: Not Progressing   Problem: Health Behavior/Discharge Planning: Goal: Ability to manage health-related needs will improve Outcome: Not Progressing

## 2023-07-29 ENCOUNTER — Telehealth: Payer: Self-pay | Admitting: *Deleted

## 2023-07-29 LAB — CBC
HCT: 38.8 % (ref 36.0–46.0)
Hemoglobin: 12.4 g/dL (ref 12.0–15.0)
MCH: 25.8 pg — ABNORMAL LOW (ref 26.0–34.0)
MCHC: 32 g/dL (ref 30.0–36.0)
MCV: 80.8 fL (ref 80.0–100.0)
Platelets: 308 K/uL (ref 150–400)
RBC: 4.8 MIL/uL (ref 3.87–5.11)
RDW: 13.6 % (ref 11.5–15.5)
WBC: 8.2 K/uL (ref 4.0–10.5)
nRBC: 0 % (ref 0.0–0.2)

## 2023-07-29 LAB — COMPREHENSIVE METABOLIC PANEL WITH GFR
ALT: 24 U/L (ref 0–44)
AST: 26 U/L (ref 15–41)
Albumin: 3 g/dL — ABNORMAL LOW (ref 3.5–5.0)
Alkaline Phosphatase: 63 U/L (ref 38–126)
Anion gap: 7 (ref 5–15)
BUN: 15 mg/dL (ref 8–23)
CO2: 26 mmol/L (ref 22–32)
Calcium: 9.7 mg/dL (ref 8.9–10.3)
Chloride: 104 mmol/L (ref 98–111)
Creatinine, Ser: 0.83 mg/dL (ref 0.44–1.00)
GFR, Estimated: >60 mL/min (ref >60)
Glucose, Bld: 150 mg/dL — ABNORMAL HIGH (ref 70–99)
Potassium: 4.4 mmol/L (ref 3.5–5.1)
Sodium: 137 mmol/L (ref 135–145)
Total Bilirubin: 0.4 mg/dL (ref 0.0–1.2)
Total Protein: 5.6 g/dL — ABNORMAL LOW (ref 6.5–8.1)

## 2023-07-29 LAB — GLUCOSE, CAPILLARY
Glucose-Capillary: 138 mg/dL — ABNORMAL HIGH (ref 70–99)
Glucose-Capillary: 270 mg/dL — ABNORMAL HIGH (ref 70–99)
Glucose-Capillary: 249 mg/dL — ABNORMAL HIGH (ref 70–99)
Glucose-Capillary: 174 mg/dL — ABNORMAL HIGH (ref 70–99)

## 2023-07-29 MED ORDER — GLYBURIDE 5 MG PO TABS
5.0000 mg | ORAL_TABLET | Freq: Every day | ORAL | Status: DC
  Administered 2023-07-29 – 2023-07-31 (×3): 5 mg via ORAL
  Filled 2023-07-29 (×3): qty 1

## 2023-07-29 MED ORDER — GLYBURIDE 2.5 MG PO TABS: 2.5000 mg | ORAL_TABLET | Freq: Once | ORAL | Status: DC

## 2023-07-29 MED ORDER — CALCIUM POLYCARBOPHIL 625 MG PO TABS
625.0000 mg | ORAL_TABLET | Freq: Every day | ORAL | Status: DC
  Administered 2023-07-29 – 2023-07-31 (×3): 625 mg via ORAL
  Filled 2023-07-29 (×3): qty 1

## 2023-07-29 NOTE — Telephone Encounter (Signed)
 Called and spoke with daughter and Appointment scheduled for 7/24 to have paperwork filled out.   Paperwork placed in [employer] for CMA under 24

## 2023-07-29 NOTE — Progress Notes (Signed)
 Occupational Therapy Session Note  Patient Details  Name: Kathryn Mcguire MRN: [id number] Date of Birth: 11-Jun-1950  Today's Date: 07/29/2023 OT Individual Time: 0821-0902+1117-1200  OT Individual Time Calculation (min): 41 min    Short Term Goals: Week 2:  OT Short Term Goal 1 (Week 2): STG=LTG d/t ELOS  Skilled Therapeutic Interventions/Progress Updates:  Session 1: Pt greeted supine in bed, pt agreeable to OT intervention.      Transfers/bed mobility/functional mobility: pt required increased assist for bed mobility needing MAX A, once EOB pt with heavy L lean. Pt initially responded to reaching for end of bed but unable to maintain grasp on R side d/t poor attention and initiation. Pt completed stand pivot to w/c with no AD and MIN HHA.  Once in w/c pt continued to sit with strong L lean. Asked pt to stand to place towels under L hip to shift pt to midline however pt unable to stand from w/c despite MAX A and multiple attempts. Ended up calling for + 2 assist with tech bringing stedy. Pt stood to stedy with MAX A +2 and transferred pt back to bed dependently for safety.  ADLs:  Grooming: pt completed seated grooming at sink with set- up assist but MAX cues for sequencing and initiation.    Exercises:  Pt completed bed level therex to promote improved L sided attention and promote bilateral integration, secured L hand to unweighted dowel with pt completing below therex: X10 chest presses  Pt noted to begin to drift off during therex, therefore terminated task.  Ended session with pt supine in bed with all needs within reach and bed alarm activated.                      Session 2: Pt greeted supine in bed, pt agreeable to OT intervention.  No pain reported during session.     Transfers/bed mobility/functional mobility: pt completed supine>sit with MIN A to elevate trunk and scoot hips to EOB. Pt with improved initiation this afternoon able to stand with MIN HHA and complete  stand pivot to w/c with MIN HHA.   ADLs:  Transfers: pt completed stand pivot to [facility] over toilet with MIN HHA. Attempted to work on stand pivot to [employer] however pt attempted to crawl onto TTB therfore terminated task for safety.  Toileting: pt unable to void needing MAX A for clothing mgmt.   Ended session with pt seated in w/c with all needs within reach and safety alarm activated.  Retrieved lap tray for LUE to provide UE support and to decrease lateral lean in sitting to L side.            Therapy Documentation Precautions:  Precautions Precautions: Fall Recall of Precautions/Restrictions: Impaired Precaution/Restrictions Comments: L hemiplegia, h/o dementia Restrictions Weight Bearing Restrictions Per Provider Order: No    Therapy/Group: Individual Therapy  [name] [facility] 07/29/2023, 12:07 PM

## 2023-07-29 NOTE — Telephone Encounter (Signed)
 Copied from CRM [id number]. Topic: General - Other >> Jul 29, 2023 10:25 AM Carrielelia G wrote: Reason for CRM:  Please call pt daughter Cashmere Dingley regarding FMLA paper work (in chart), patient scheduled to be discharged 08/02/23  Please advise

## 2023-07-29 NOTE — Progress Notes (Addendum)
 PROGRESS NOTE   Subjective/Complaints:  Pt working with OT. She denies any complaints but OT tells me she is having more difficulty maintaining balance/position today.   ROS: Limited due to cognitive/behavioral   Objective:   No results found. Recent Labs    07/29/23 0518  WBC 8.2  HGB 12.4  HCT 38.8  PLT 308     No results for input(s): NA, K, CL, CO2, GLUCOSE, BUN, CREATININE, CALCIUM  in the last 72 hours.    Intake/Output Summary (Last 24 hours) at 07/29/2023 0842 Last data filed at 07/29/2023 0825 Gross per 24 hour  Intake 1000 ml  Output --  Net 1000 ml        Physical Exam: Vital Signs Blood pressure 129/63, pulse 73, temperature 98.6 F (37 C), temperature source Oral, resp. rate 18, height 5' 3 (1.6 m), weight 56 kg, SpO2 98%.   Constitutional: No distress . Vital signs reviewed. HEENT: NCAT, EOMI, oral membranes moist Neck: supple Cardiovascular: RRR without murmur. No JVD    Respiratory/Chest: CTA Bilaterally without wheezes or rales. Normal effort    GI/Abdomen: BS +, non-tender, non-distended Ext: no clubbing, cyanosis, or edema Psych: pleasant and cooperative  Skin: No evidence of breakdown, no evidence of rash Neurologic:Oriented to person, [facility], stroke again, sometimes needs cues. . Cranial nerves II through XII intact, motor strength is 4/5 in Right hand. 0/5 left deltoid, bicep, tricep, grip. Can spontaneously move left hip flexors, knee extensors, ankle dorsiflexor and plantar flexors but was not consistent during MMT. She  exhibits early tone in LUE: tr pecs, tr-1 biceps, tr wrist flexors.  Sensory exam normal sensation to light touch and proprioception in bilateral upper and lower extremities Cerebellar exam normal finger to nose to finger as well as heel to shin in bilateral upper and lower extremities She demonstrated more difficulty maintaining upright seated position  today. I repositioned her, and she gradually began sagging to left again.  Musculoskeletal: Full range of motion in all 4 extremities. No joint swelling    Assessment/Plan: 1. Functional deficits which require 3+ hours per day of interdisciplinary therapy in a comprehensive inpatient rehab setting. Physiatrist is providing close team supervision and 24 hour management of active medical problems listed below. Physiatrist and rehab team continue to assess barriers to discharge/monitor patient progress toward functional and medical goals  Care Tool:  Bathing    Body parts bathed by patient: Face, Chest, Front perineal area, Abdomen   Body parts bathed by helper: Right arm, Left arm, Right upper leg, Left upper leg, Right lower leg, Left lower leg, Buttocks     Bathing assist Assist Level: Maximal Assistance - Patient 24 - 49%     Upper Body Dressing/Undressing Upper body dressing   What is the patient wearing?: Pull over shirt    Upper body assist Assist Level: Total Assistance - Patient < 25%    Lower Body Dressing/Undressing Lower body dressing      What is the patient wearing?: Pants     Lower body assist Assist for lower body dressing: Moderate Assistance - Patient 50 - 74%     Toileting Toileting Toileting Activity did not occur Press photographer and  hygiene only): N/A (no void or bm)  Toileting assist Assist for toileting: Total Assistance - Patient < 25%     Transfers Chair/bed transfer  Transfers assist  Chair/bed transfer activity did not occur: Safety/medical concerns  Chair/bed transfer assist level: Contact Guard/Touching assist     Locomotion Ambulation   Ambulation assist      Assist level: Minimal Assistance - Patient > 75% Assistive device: Hand held assist Max distance: >250'   Walk 10 feet activity   Assist     Assist level: Contact Guard/Touching assist Assistive device: Hand held assist   Walk 50 feet activity   Assist     Assist level: Contact Guard/Touching assist Assistive device: Hand held assist    Walk 150 feet activity   Assist Walk 150 feet activity did not occur: Safety/medical concerns  Assist level: Contact Guard/Touching assist Assistive device: Hand held assist    Walk 10 feet on uneven surface  activity   Assist Walk 10 feet on uneven surfaces activity did not occur: Safety/medical concerns         Wheelchair     Assist Is the patient using a wheelchair?: Yes Type of Wheelchair: Manual    Wheelchair assist level: Dependent - Patient 0%      Wheelchair 50 feet with 2 turns activity    Assist        Assist Level: Dependent - Patient 0%   Wheelchair 150 feet activity     Assist      Assist Level: Dependent - Patient 0%   Blood pressure 129/63, pulse 73, temperature 98.6 F (37 C), temperature source Oral, resp. rate 18, height 5' 3 (1.6 m), weight 56 kg, SpO2 98%.  Medical Problem List and Plan: 1. 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           -I don't see anything focal on exam          -CBC ok, she's afebrile. Check cmet today          -otherwise observe today 2.  Antithrombotics: -DVT/anticoagulation:  Pharmaceutical: Lovenox  40mg  daily             -antiplatelet therapy: DAPT X 3 months followed by ASA alone.  3. Pain Management/spasticity: tylenol  prn -7/15 pt with early flexor tone of LUE. It is mild, and I think at this point, general ROM and positioning is adequate.    -ordered WHO for LUE 4. Mood/Behavior/Sleep: LCSW to follow for evaluation and support.              --does get up 3-4 times every night (food/tobacco)             -antipsychotic agents: N/a 5.  Neuropsych/cognition: This patient is not capable of making decisions on her own behalf. 6. Skin/Wound Care: Routine pressure relief measures.  7. Fluids/Electrolytes/Nutrition: Monitor I/O. Family reports great appetite-->eats all day long. Eating 100% Order IVF if intake <1035mL  8.  T2DM: Hgb A1c- 12.3. Monitor BS ac/hs and use SSI for elevated BS             --Was  on 70/30 insulin  6 units bid and metformin  1000 mg bid  -Will restart 70/30 insulin  at 6u bid on 7/10, improved  -will add glyburide  2.5mg  daily and dc metformin  given recent loose stool -7/15 CBG low this am. Will hold PM glyburide   -07/29/23 CBGs borderline high  with  occasional 200s reading   -increase glyburide  to 5mg  daily       CBG (last 3)  Recent Labs    07/28/23 1655 07/28/23 2106 07/29/23 0618  GLUCAP 140* 205* 138*   9. 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  -continue supplements  10. H/o bipolar d/o: Used to have manic episodes/wander, etc but stable off medications.   11. Hyperlipidemia: Continue increased dose lipitor 40mg  daily  12. Dementia: On Aricept  5mg  nightly (followed by Dr. Georjean in the past) -per daughters pt was generally independent within the home, needing  supervision with bathing and some ADL's for safety.   13. COPD/Tobacco use: Chews 4 packs of tobacco daily.    No cravings  14. Blood pressure: Monitor TID--keep SBP > 110. --avoid hypotension given R-ICA occlusion.  May d/c IVF after current bag -7/15 bp's soft--seems to be tolerating---no changes for now -push fluids.  -07/29/23 BPs under conrol Vitals:   07/25/23 0502 07/25/23 1352 07/25/23 2038 07/26/23 0520  BP: 122/72 102/89 129/72 (!) 144/67   07/26/23 1544 07/26/23 2017 07/27/23 0643 07/27/23 1604  BP: 123/62 (!) 105/59 116/74 (!) 131/91   07/27/23 2000 07/28/23 0606 07/28/23 1348 07/29/23 0450  BP: 106/60 108/68 120/61 129/63    15. Mild  leucocytosis: resolved    Latest Ref Rng & Units 07/29/2023    5:18 AM 07/25/2023    4:58 AM 07/22/2023    5:15 AM  CBC  WBC 4.0 - 10.5 K/uL 8.2  8.2  9.0   Hemoglobin 12.0 - 15.0 g/dL 87.5  87.4  88.0   Hematocrit 36.0 - 46.0 % 38.8  39.0  36.8   Platelets 150 - 400 K/uL 308  314  290     16. Mild hyponatremia: resolved     Latest Ref Rng & Units 07/23/2023    6:45 PM 07/21/2023    7:24 AM 07/17/2023    6:00 AM  BMP  Glucose 70 - 99 mg/dL 838  887  779   BUN 8 - 23 mg/dL 16  14  19    Creatinine 0.44 - 1.00 mg/dL 9.00  9.33  9.34   Sodium 135 - 145 mmol/L 135  135  134   Potassium 3.5 - 5.1 mmol/L 4.3  3.9  4.6   Chloride 98 - 111 mmol/L 98  103  101   CO2 22 - 32 mmol/L 25  21  23    Calcium  8.9 - 10.3 mg/dL 9.9  9.1  9.3     17. Diarrhea:  -07/20/23 pt having multiple episodes of diarrhea over last few days, imodium  added, labs for tomorrow.  -unclear etiology, doubt c.diff but consider checking if continued/symptoms change -could be from nicotine  withdrawal? Or if metformin  was just recently added back? -7/13-14/25 diarrhea stopped with one dose of imodium  7/12 -labs unremarkable.  -stopped metformin   -07/27/23 LBM last night, type 4, has 4-5/day but don't seem to be diarrhea anymore. Monitor -07/29/23 had 2 type 6 stools this am.     -will add fiber to help bulk up a bit   -consider holding ensure max?  LOS: 13 days A FACE  TO FACE EVALUATION WAS PERFORMED  Arthea ONEIDA Gunther 07/29/2023, 8:42 AM

## 2023-07-29 NOTE — Progress Notes (Signed)
 Physical Therapy Session Note  Patient Details  Name: Kathryn Mcguire MRN: [id number] Date of Birth: 1950-02-08  Today's Date: 07/29/2023 PT Individual Time: 1000-1100 PT Individual Time Calculation (min): 60 min   Short Term Goals: Week 2:  PT Short Term Goal 1 (Week 2): = LTGs  Skilled Therapeutic Interventions/Progress Updates:    Pt seated in bed on arrival and agreeable to therapy. Bed mobility with CGA initially, but then pt lost balance toward L side and required tot a to prevent fall. Pt able to maintain balance with cueing to hold onto foot board with R hand. Pt performed ambulatory transfer to chair with HHA. Pt transported to therapy gym for time management and energy conservation. Transfer to mat in same manner. Pt with difficulty coordinating side steps to R side or scooting without max a. Pt positioned with R shoulder against wall and mirror in front of pt. Pt performed reaching activity with squigz on mirror using RUE. Max cueing to keep R shoulder on wall fading to intermittent with repetition. Pt initially agreeable to gait training, and performed ambulatory transfer to w/c with max a to not miss chair d/t turning all the way around for chair placed on L side. Pt then unable/unwilling to perform Sit to stand in order to participate in gait training despite multiple attempts at encouragement and ensuring no pain, etc.   At this time noted pt had incontinent episode so returned to room. Pt eventually required max a x 2 to stand with stedy and max a for bed mobility to return to bed. Pt handed off to NT to complete brief change.  Therapy Documentation Precautions:  Precautions Precautions: Fall Recall of Precautions/Restrictions: Impaired Precaution/Restrictions Comments: L hemiplegia, h/o dementia Restrictions Weight Bearing Restrictions Per Provider Order: No General:       Therapy/Group: Individual Therapy  Schuyler JAYSON Batter 07/29/2023, 12:45 PM

## 2023-07-29 NOTE — Progress Notes (Signed)
 Speech Language Pathology Daily Session Note  Patient Details  Name: Kathryn Mcguire MRN: [id number] Date of Birth: 1950-07-05  Today's Date: 07/29/2023 SLP Individual Time: 1345-1425 SLP Individual Time Calculation (min): 40 min  Short Term Goals: Week 2: SLP Short Term Goal 1 (Week 2): STGs= LTGS due to ELOS  Skilled Therapeutic Interventions: Skilled therapy session focused on cognitive goals. SLP facilitated session by targeting orientation taks. Patient independently oriented to self, situation, location and general time (month/year), though unsure of date/DOW. Patient continually answering I don't know to all SLP questions, requiring maxA to utilize calendar this date. SLP targeted dysphagia goals through trials of D3 solids/thin liquids. Patient required minA to eliminate shoveling and consume PO at a slow rate to avoid L anterior spillage. Patient with complete oral clearance with independent use of liquid wash and no s/sx of aspiration. Recommend continuation of D3 trials before upgrade. At the end of the session, SLP targeted L attention through visual scanning exercises of numbers and letters. Patient required minA to complete before refusing further participation. Patient continually stating I don't know and when SLP provided further options to continue session, patient requested d/c of session. Remaining 20 minutes of session missed. Patient left in [facility] with alarm set and call bell in reach. Continue POC.  Pain None reported   Therapy/Group: Individual Therapy  Jazzmyne Rasnick M.A., CCC-SLP 07/29/2023, 7:40 AM

## 2023-07-29 NOTE — Plan of Care (Signed)
 Remains incontinent of bowel and bladder w toileting

## 2023-07-30 LAB — GLUCOSE, CAPILLARY
Glucose-Capillary: 163 mg/dL — ABNORMAL HIGH (ref 70–99)
Glucose-Capillary: 205 mg/dL — ABNORMAL HIGH (ref 70–99)
Glucose-Capillary: 259 mg/dL — ABNORMAL HIGH (ref 70–99)
Glucose-Capillary: 210 mg/dL — ABNORMAL HIGH (ref 70–99)

## 2023-07-30 LAB — BASIC METABOLIC PANEL WITH GFR
Anion gap: 8 (ref 5–15)
BUN: 21 mg/dL (ref 8–23)
CO2: 26 mmol/L (ref 22–32)
Calcium: 9.1 mg/dL (ref 8.9–10.3)
Chloride: 102 mmol/L (ref 98–111)
Creatinine, Ser: 0.94 mg/dL (ref 0.44–1.00)
GFR, Estimated: >60 mL/min (ref >60)
Glucose, Bld: 253 mg/dL — ABNORMAL HIGH (ref 70–99)
Potassium: 4.1 mmol/L (ref 3.5–5.1)
Sodium: 136 mmol/L (ref 135–145)

## 2023-07-30 MED ORDER — DONEPEZIL HCL 5 MG PO TABS
5.0000 mg | ORAL_TABLET | Freq: Every day | ORAL | 0 refills | Status: AC
Start: 2023-07-30 — End: ?
  Filled 2023-07-30: qty 30, 30d supply, fill #0

## 2023-07-30 MED ORDER — INSULIN ASPART PROT & ASPART (70-30 MIX) 100 UNIT/ML PEN
6.0000 [IU] | PEN_INJECTOR | Freq: Two times a day (BID) | SUBCUTANEOUS | 0 refills | Status: AC
  Filled 2023-07-30: qty 3, 25d supply, fill #0

## 2023-07-30 MED ORDER — ACETAMINOPHEN 325 MG PO TABS: 325.0000 mg | ORAL_TABLET | ORAL | Status: AC | PRN

## 2023-07-30 MED ORDER — PANTOPRAZOLE SODIUM 40 MG PO TBEC
40.0000 mg | DELAYED_RELEASE_TABLET | Freq: Every day | ORAL | 0 refills | Status: DC
  Filled 2023-07-30: qty 30, 30d supply, fill #0

## 2023-07-30 MED ORDER — CLOPIDOGREL BISULFATE 75 MG PO TABS
75.0000 mg | ORAL_TABLET | Freq: Every day | ORAL | 0 refills | Status: DC
  Filled 2023-07-30: qty 30, 30d supply, fill #0

## 2023-07-30 MED ORDER — ATORVASTATIN CALCIUM 40 MG PO TABS
40.0000 mg | ORAL_TABLET | Freq: Every day | ORAL | 0 refills | Status: DC
Start: 2023-07-30 — End: 2023-09-03
  Filled 2023-07-30: qty 30, 30d supply, fill #0

## 2023-07-30 MED ORDER — ASPIRIN 81 MG PO CHEW
81.0000 mg | CHEWABLE_TABLET | Freq: Every day | ORAL | 0 refills | Status: AC
  Filled 2023-07-30: qty 90, 90d supply, fill #0

## 2023-07-30 MED ORDER — GLYBURIDE 5 MG PO TABS
5.0000 mg | ORAL_TABLET | Freq: Every day | ORAL | 0 refills | Status: DC
  Filled 2023-07-30: qty 30, 30d supply, fill #0

## 2023-07-30 MED ORDER — INSULIN PEN NEEDLE 32G X 4 MM MISC
1.0000 | Freq: Two times a day (BID) | 0 refills | Status: DC
  Filled 2023-07-30: qty 100, 30d supply, fill #0

## 2023-07-30 MED ORDER — CALCIUM POLYCARBOPHIL 625 MG PO TABS
625.0000 mg | ORAL_TABLET | Freq: Every day | ORAL | 0 refills | Status: DC
Start: 2023-07-31 — End: 2023-09-03
  Filled 2023-07-30: qty 30, 30d supply, fill #0

## 2023-07-30 NOTE — Discharge Instructions (Signed)
 Inpatient Rehab Discharge Instructions  Kathryn Mcguire Discharge date and time:  07/31/23  Activities/Precautions/ Functional Status: Activity: activity as tolerated Diet: cardiac diet and diabetic diet--chopped foods.  Wound Care: none needed   Functional status:  ___ No restrictions     ___ Walk up steps independently ___ 24/7 supervision/assistance   ___ Walk up steps with assistance ___ Intermittent supervision/assistance  ___ Bathe/dress independently ___ Walk with walker     ___ Bathe/dress with assistance ___ Walk Independently    ___ Shower independently ___ Walk with assistance    ___ Shower with assistance ___ No alcohol     ___ Return to work/school ________    Special Instructions:    STROKE/TIA DISCHARGE INSTRUCTIONS SMOKING Cigarette smoking nearly doubles your risk of having a stroke & is the single most alterable risk factor  If you smoke or have smoked in the last 12 months, you are advised to quit smoking for your health. Most of the excess cardiovascular risk related to smoking disappears within a year of stopping. Ask you doctor about anti-smoking medications [place] Quit Line: 1-800-QUIT NOW Free Smoking Cessation Classes [phone]  CHOLESTEROL Know your levels; limit fat & cholesterol in your diet  Lipid Panel     Component Value Date/Time   CHOL 171 07/12/2023 0512   TRIG 58 07/12/2023 0512   HDL 49 07/12/2023 0512   CHOLHDL 3.5 07/12/2023 0512   VLDL 12 07/12/2023 0512   LDLCALC 110 (H) 07/12/2023 0512   LDLCALC 128 (H) 07/17/2022 1354     Many patients benefit from treatment even if their cholesterol is at goal. Goal: Total Cholesterol (CHOL) less than 160 Goal:  Triglycerides (TRIG) less than 150 Goal:  HDL greater than 40 Goal:  LDL (LDLCALC) less than 100   BLOOD PRESSURE American Stroke Association blood pressure target is less that 120/80 mm/Hg  Your discharge blood pressure is:  BP: 101/73 Monitor your blood pressure Limit your  salt and alcohol intake Many individuals will require more than one medication for high blood pressure  DIABETES (A1c is a blood sugar average for last 3 months) Goal HGBA1c is under 7% (HBGA1c is blood sugar average for last 3 months)  Diabetes:     Lab Results  Component Value Date   HGBA1C 12.3 (H) 07/11/2023    Your HGBA1c can be lowered with medications, healthy diet, and exercise. Check your blood sugar as directed by your physician Call your physician if you experience unexplained or low blood sugars.  PHYSICAL ACTIVITY/REHABILITATION Goal is 30 minutes at least 4 days per week  Activity: Increase activity slowly, and No driving, Therapies: see above Return to work: N/A Activity decreases your risk of heart attack and stroke and makes your heart stronger.  It helps control your weight and blood pressure; helps you relax and can improve your mood. Participate in a regular exercise program. Talk with your doctor about the best form of exercise for you (dancing, walking, swimming, cycling).  DIET/WEIGHT Goal is to maintain a healthy weight  Your discharge diet is:  Diet Order             DIET DYS 2 Fluid consistency: Thin  Diet effective 0500                   liquids Your height is:  Height: 5' 3 (160 cm) Your current weight is: Weight: 55 kg Your Body Mass Index (BMI) is:  BMI (Calculated): 21.48 Following the type of diet  specifically designed for you will help prevent another stroke. You are at goal weight    Your goal Body Mass Index (BMI) is 19-24. Healthy food habits can help reduce 3 risk factors for stroke:  High cholesterol, hypertension, and excess weight.  RESOURCES Stroke/Support Group:  Call [phone]   STROKE EDUCATION PROVIDED/REVIEWED AND GIVEN TO PATIENT Stroke warning signs and symptoms How to activate emergency medical system (call 911). Medications prescribed at discharge. Need for follow-up after discharge. Personal risk factors for  stroke. Pneumonia vaccine given:  Flu vaccine given:  My questions have been answered, the writing is legible, and I understand these instructions.  I will adhere to these goals & educational materials that have been provided to me after my discharge from the hospital.     My questions have been answered and I understand these instructions. I will adhere to these goals and the provided educational materials after my discharge from the hospital.  Patient/Caregiver Signature _______________________________ Date __________  Clinician Signature _______________________________________ Date __________  Please bring this form and your medication list with you to all your follow-up doctor's appointments.

## 2023-07-30 NOTE — Plan of Care (Signed)
  Problem: Education: Goal: Ability to describe self-care measures that may prevent or decrease complications (Diabetes Survival Skills Education) will improve Outcome: Progressing Goal: Individualized Educational Video(s) Outcome: Progressing   Problem: Coping: Goal: Ability to adjust to condition or change in health will improve Outcome: Progressing   Problem: Fluid Volume: Goal: Ability to maintain a balanced intake and output will improve Outcome: Progressing   Problem: Health Behavior/Discharge Planning: Goal: Ability to identify and utilize available resources and services will improve Outcome: Progressing Goal: Ability to manage health-related needs will improve Outcome: Progressing   Problem: Metabolic: Goal: Ability to maintain appropriate glucose levels will improve Outcome: Progressing   Problem: Nutritional: Goal: Maintenance of adequate nutrition will improve Outcome: Progressing Goal: Progress toward achieving an optimal weight will improve Outcome: Progressing   Problem: Skin Integrity: Goal: Risk for impaired skin integrity will decrease Outcome: Progressing   Problem: Tissue Perfusion: Goal: Adequacy of tissue perfusion will improve Outcome: Progressing   Problem: RH Vision Goal: RH LTG Vision (Specify) Outcome: Progressing

## 2023-07-30 NOTE — Progress Notes (Signed)
 PROGRESS NOTE   Subjective/Complaints:  No complaints today.  Resting in bed.  Reviewed labs which have improved.  Off IV fluids  ROS: Limited due to cognitive/behavioral   Objective:   No results found. Recent Labs    07/29/23 0518  WBC 8.2  HGB 12.4  HCT 38.8  PLT 308     Recent Labs    07/29/23 0518  NA 137  K 4.4  CL 104  CO2 26  GLUCOSE 150*  BUN 15  CREATININE 0.83  CALCIUM  9.7      Intake/Output Summary (Last 24 hours) at 07/30/2023 0911 Last data filed at 07/30/2023 0835 Gross per 24 hour  Intake 712 ml  Output --  Net 712 ml        Physical Exam: Vital Signs Blood pressure 107/61, pulse 62, temperature 97.8 F (36.6 C), temperature source Oral, resp. rate 17, height 5' 3 (1.6 m), weight 55 kg, SpO2 99%.    General: No acute distress Mood and affect are appropriate Heart: Regular rate and rhythm no rubs murmurs or extra sounds Lungs: Clear to auscultation, breathing unlabored, no rales or wheezes Abdomen: Positive bowel sounds, soft nontender to palpation, nondistended Extremities: No clubbing, cyanosis, or edema Skin: No evidence of breakdown, no evidence of rash Neurologic:Oriented to person, [facility] stroke again, sometimes needs cues. . Cranial nerves II through XII intact, motor strength is 4/5 in Right hand. 0/5 left deltoid, bicep, tricep, grip. Can 4/5 left hip flexors, knee extensors, ankle dorsiflexor and plantar flexors but was not consistent during MMT. She  exhibits early tone in LUE: tr pecs, tr-1 biceps, tr wrist flexors.  Sensory exam normal sensation to light touch and proprioception in bilateral upper and lower extremities Cerebellar exam normal finger to nose to finger as well as heel to shin in bilateral upper and lower extremities She demonstrated more difficulty maintaining upright seated position today. I repositioned her, and she gradually began sagging to left again.   Musculoskeletal: Full range of motion in all 4 extremities. No joint swelling    Assessment/Plan: 1. Functional deficits which require 3+ hours per day of interdisciplinary therapy in a comprehensive inpatient rehab setting. Physiatrist is providing close team supervision and 24 hour management of active medical problems listed below. Physiatrist and rehab team continue to assess barriers to discharge/monitor patient progress toward functional and medical goals  Care Tool:  Bathing    Body parts bathed by patient: Face, Chest, Front perineal area, Abdomen   Body parts bathed by helper: Right arm, Left arm, Right upper leg, Left upper leg, Right lower leg, Left lower leg, Buttocks     Bathing assist Assist Level: Maximal Assistance - Patient 24 - 49%     Upper Body Dressing/Undressing Upper body dressing   What is the patient wearing?: Pull over shirt    Upper body assist Assist Level: Total Assistance - Patient < 25%    Lower Body Dressing/Undressing Lower body dressing      What is the patient wearing?: Pants     Lower body assist Assist for lower body dressing: Moderate Assistance - Patient 50 - 74%     Toileting Toileting Toileting Activity did  not occur Press photographer and hygiene only): N/A (no void or bm)  Toileting assist Assist for toileting: Total Assistance - Patient < 25%     Transfers Chair/bed transfer  Transfers assist  Chair/bed transfer activity did not occur: Safety/medical concerns  Chair/bed transfer assist level: Contact Guard/Touching assist     Locomotion Ambulation   Ambulation assist      Assist level: Minimal Assistance - Patient > 75% Assistive device: Hand held assist Max distance: >250'   Walk 10 feet activity   Assist     Assist level: Contact Guard/Touching assist Assistive device: Hand held assist   Walk 50 feet activity   Assist    Assist level: Contact Guard/Touching assist Assistive device: Hand  held assist    Walk 150 feet activity   Assist Walk 150 feet activity did not occur: Safety/medical concerns  Assist level: Contact Guard/Touching assist Assistive device: Hand held assist    Walk 10 feet on uneven surface  activity   Assist Walk 10 feet on uneven surfaces activity did not occur: Safety/medical concerns         Wheelchair     Assist Is the patient using a wheelchair?: Yes Type of Wheelchair: Manual    Wheelchair assist level: Dependent - Patient 0%      Wheelchair 50 feet with 2 turns activity    Assist        Assist Level: Dependent - Patient 0%   Wheelchair 150 feet activity     Assist      Assist Level: Dependent - Patient 0%   Blood pressure 107/61, pulse 62, temperature 97.8 F (36.6 C), temperature source Oral, resp. rate 17, height 5' 3 (1.6 m), weight 55 kg, SpO2 99%.  Medical Problem List and Plan: 1. 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 2.  Antithrombotics: -DVT/anticoagulation:  Pharmaceutical: Lovenox  40mg  daily             -antiplatelet therapy: DAPT X 3 months followed by ASA alone.  3. Pain Management/spasticity: tylenol  prn -7/15 pt with early flexor tone of LUE. It is mild, and I think at this point, general ROM and positioning is adequate.    -ordered WHO for LUE 4. Mood/Behavior/Sleep: LCSW to follow for evaluation and support.              --does get up 3-4 times every night (food/tobacco)             -antipsychotic agents: N/a 5. Neuropsych/cognition: This patient is not capable of making decisions on her own behalf. 6. Skin/Wound Care: Routine pressure relief measures.  7. Fluids/Electrolytes/Nutrition: Monitor I/O. Family reports great appetite-->eats all day long. Eating 100% Order IVF if intake <1071mL  8.   T2DM: Hgb A1c- 12.3. Monitor BS ac/hs and use SSI for elevated BS             --Was on 70/30 insulin  6 units bid and metformin  1000 mg bid  -Will restart 70/30 insulin  at 6u bid on 7/10, improved  -will add glyburide  2.5mg  daily and dc metformin  given recent loose stool -7/15 CBG low this am. Will hold PM glyburide   -07/29/23 CBGs borderline high  with  occasional 200s reading   -  increase glyburide  to 5mg  daily       CBG (last 3)  Recent Labs    07/29/23 1641 07/29/23 2102 07/30/23 0612  GLUCAP 249* 174* 163*  Generally controlled on current dosing of insulin , DiaBeta  recently increased no further changes prior to discharge 9. 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  -continue supplements  10. H/o bipolar d/o: Used to have manic episodes/wander, etc but stable off medications.   11. Hyperlipidemia: Continue increased dose lipitor 40mg  daily  12. Dementia: On Aricept  5mg  nightly (followed by Dr. Georjean in the past) -per daughters pt was generally independent within the home, needing  supervision with bathing and some ADL's for safety.   13. COPD/Tobacco use: Chews 4 packs of tobacco daily.    No cravings  14. Blood pressure: Monitor TID--keep SBP > 110. --avoid hypotension given R-ICA occlusion.  May d/c IVF after current bag -7/15 bp's soft--seems to be tolerating---no changes for now -push fluids.  -07/29/23 BPs under conrol Vitals:   07/26/23 0520 07/26/23 1544 07/26/23 2017 07/27/23 0643  BP: (!) 144/67 123/62 (!) 105/59 116/74   07/27/23 1604 07/27/23 2000 07/28/23 0606 07/28/23 1348  BP: (!) 131/91 106/60 108/68 120/61   07/29/23 0450 07/29/23 1244 07/29/23 2000 07/30/23 0447  BP: 129/63 94/71 112/65 107/61    15. Mild leucocytosis: resolved    Latest Ref Rng & Units 07/29/2023    5:18 AM 07/25/2023    4:58 AM 07/22/2023    5:15 AM  CBC  WBC 4.0 - 10.5 K/uL 8.2  8.2  9.0   Hemoglobin 12.0 - 15.0  g/dL 87.5  87.4  88.0   Hematocrit 36.0 - 46.0 % 38.8  39.0  36.8   Platelets 150 - 400 K/uL 308  314  290     16. Mild hyponatremia: resolved     Latest Ref Rng & Units 07/29/2023    5:18 AM 07/23/2023    6:45 PM 07/21/2023    7:24 AM  BMP  Glucose 70 - 99 mg/dL 849  838  887   BUN 8 - 23 mg/dL 15  16  14    Creatinine 0.44 - 1.00 mg/dL 9.16  9.00  9.33   Sodium 135 - 145 mmol/L 137  135  135   Potassium 3.5 - 5.1 mmol/L 4.4  4.3  3.9   Chloride 98 - 111 mmol/L 104  98  103   CO2 22 - 32 mmol/L 26  25  21    Calcium  8.9 - 10.3 mg/dL 9.7  9.9  9.1     17. Diarrhea:  -07/20/23 pt having multiple episodes of diarrhea over last few days, imodium  added, labs for tomorrow.  -unclear etiology, doubt c.diff but consider checking if continued/symptoms change -could be from nicotine  withdrawal? Or if metformin  was just recently added back? -7/13-14/25 diarrhea stopped with one dose of imodium  7/12 -labs unremarkable.  -stopped metformin   -07/27/23 LBM last night, type 4, has 4-5/day but don't seem to be diarrhea anymore. Monitor -07/29/23 had 2 type 6 stools this am.     -will add fiber to help bulk up a bit   -consider holding ensure max?  LOS: 14 days A FACE TO FACE EVALUATION WAS PERFORMED  Prentice FORBES Compton 07/30/2023, 9:11 AM

## 2023-07-30 NOTE — Progress Notes (Signed)
 Physical Therapy Discharge Summary  Patient Details  Name: Kathryn Mcguire MRN: [id number] Date of Birth: [birth year]  Date of Discharge from PT service:July 30, 2023  Patient has met 4 of 9 long term goals due to {due un:6958322}.  Patient to discharge at [facility] level {LOA:3049010}.   Patient's care partner {care partner:3041650} to provide the necessary {assistance:3041652} assistance at discharge.  Reasons goals not met: Pt fluctuated in functional mobility during therapy sessions and would overall require minA when pt would participate, but can also require maxA to initiate tasks which was difficult to determine if it was due to pt unwilling to participate, or due to dementia, poor motor planning/initiation/apraxia. Pt can ambulate CGA/light minA to maintain standing balance, but also presents with poor L sided awareness to navigate safely, therefore pt requires modA to do so within home/community/controlled environments.   Recommendation:  Patient will benefit from ongoing skilled PT services in {setting:3041680} to continue to advance safe functional mobility, address ongoing impairments in ***, and minimize fall risk.  Equipment: {equipment:3041657}  Reasons for discharge: {Reason for discharge:3049018}  Patient/family agrees with progress made and goals achieved: {Pt/Family agree with progress/goals:3049020}  PT Discharge Precautions/Restrictions Precautions Precautions: Fall Precaution/Restrictions Comments: L hemiparesis, h/o dementia Restrictions Weight Bearing Restrictions Per Provider Order: No Pain Pain Assessment Pain Scale: 0-10 Pain Score: 0-No pain Pain Interference Pain Interference Pain Effect on Sleep: 1. Rarely or not at all Pain Interference with Therapy Activities: 1. Rarely or not at all Pain Interference with Day-to-Day Activities: 1. Rarely or not at all Vision/Perception  Vision - History Ability to See in Adequate Light: 0  Adequate Vision - Assessment Additional Comments: impacted by left inattetion/ neglect Perception Perception: Impaired Preception Impairment Details: Inattention/Neglect;Body Part identification;Spatial orientation Perception-Other Comments: L inattention Praxis Praxis: Impaired Praxis Impairment Details: Initiation;Ideomotor;Perseveration;Motor planning;Organization;Limb apraxia  Cognition Overall Cognitive Status: Impaired/Different from baseline Arousal/Alertness: Awake/alert Orientation Level: Oriented X4 Year: 2025 Attention: Sustained Focused Attention: Impaired Focused Attention Impairment: Verbal basic;Functional basic Sustained Attention: Impaired Sustained Attention Impairment: Verbal basic;Functional basic Memory: Impaired Memory Impairment: Retrieval deficit;Decreased recall of new information;Decreased long term memory;Decreased short term memory;Storage deficit Awareness: Impaired Awareness Impairment: Intellectual impairment Problem Solving: Impaired Problem Solving Impairment: Verbal basic;Functional basic Safety/Judgment: Impaired Comments: L inattention/ left neglect Sensation Sensation Light Touch: Impaired by gross assessment Light Touch Impaired Details: Impaired LUE;Impaired LLE;Absent LUE Hot/Cold: Impaired by gross assessment Proprioception: Impaired by gross assessment Proprioception Impaired Details: Impaired LLE;Absent LUE Coordination Gross Motor Movements are Fluid and Coordinated: No Fine Motor Movements are Fluid and Coordinated: No Finger Nose Finger Test: Modified testing - unilateral with RUE - Appears WFL - difficult following instructions Motor  Motor Motor: Other (comment);Motor apraxia;Abnormal tone;Motor impersistence (Hemiparesis) Motor - Discharge Observations: Abnormal tone in L knee flexors  Mobility Bed Mobility Bed Mobility: Supine to Sit;Sit to Supine Supine to Sit: Minimal Assistance - Patient > 75% Sit to Supine: Minimal  Assistance - Patient > 75% Transfers Transfers: Sit to Stand;Stand to Sit;Stand Pivot Transfers Sit to Stand: Contact Guard/Touching assist Stand to Sit: Contact Guard/Touching assist Stand Pivot Transfers: Minimal Assistance - Patient > 75% Stand Pivot Transfer Details: Verbal cues for precautions/safety Transfer (Assistive device): None Locomotion  Gait Ambulation: Yes Gait Assistance: Moderate Assistance - Patient 50-74% (CGA for balance, but max cuing for safety awareness due to significant L neglect) Gait Distance (Feet): 150 Feet Assistive device: None Gait Assistance Details: Verbal cues for precautions/safety Gait Gait: Yes Gait Pattern: Impaired Gait Pattern: Narrow base of support;Poor foot clearance -  left;Poor foot clearance - right;Step-through pattern Stairs / Additional Locomotion Stairs: Yes Stairs Assistance: Moderate Assistance - Patient 50 - 74% (minA for balance, but modA due to significant decrease in safety awareness and significant L neglect) Stair Management Technique: One rail Right Number of Stairs: 4 Height of Stairs: 6 Wheelchair Mobility Wheelchair Mobility: No  Trunk/Postural Assessment  Cervical Assessment Cervical Assessment:  (Maintains head in slight L rotation) Thoracic Assessment Thoracic Assessment: Within Functional Limits Lumbar Assessment Lumbar Assessment: Within Functional Limits Postural Control Righting Reactions: Decreased. While in stance pt has anterior L lean.  Balance Balance Balance Assessed: Yes Static Sitting Balance Static Sitting - Balance Support: Feet supported Static Sitting - Level of Assistance: 5: Stand by assistance Dynamic Sitting Balance Dynamic Sitting - Balance Support: During functional activity;Feet supported Dynamic Sitting - Level of Assistance: 4: Min Oncologist Standing - Balance Support: No upper extremity supported Static Standing - Level of Assistance: 5: Stand by  assistance Dynamic Standing Balance Dynamic Standing - Balance Support: No upper extremity supported Dynamic Standing - Level of Assistance: 4: Min assist Extremity Assessment  RLE Assessment RLE Assessment: Within Functional Limits LLE Assessment LLE Assessment: Within Functional Limits General Strength Comments: Grossly 4-/5   Madsen Riddle PTA   07/30/2023, 11:06 AM

## 2023-07-30 NOTE — Progress Notes (Signed)
 Physical Therapy Session Note  Patient Details  Name: Kathryn Mcguire MRN: [id number] Date of Birth: 11-06-1950  Today's Date: 07/30/2023 PT Individual Time: 0950-1030 PT Individual Time Calculation (min): 40 min   Short Term Goals: Week 2:  PT Short Term Goal 1 (Week 2): = LTGs  Skilled Therapeutic Interventions/Progress Updates: Patient supine in bed on entrance to room. Patient alert and agreeable to PT session.   Patient with no complaints of pain  Therapeutic Activity: Bed Mobility: Pt performed supine<sit on EOB with minA and bed flat. Pt with one moment of L LOB to get to sitting with delayed/poor right reactions with R UE to pull self back up to sitting via abdominals  Transfers: Pt performed sit<>stand transfers throughout session with CGA for safety due to poor L attention. Pt transported outside to [facility] to further encourage participation to get OOB. Once outside PTA attempted to donn gait belt but pt would repeat okay when asked to lean forward. When asked if pt wanted to go back inside to room, pt nodded yes. Pt back upstairs on rehab floor and PTA further encouraged pt to participate with pt stating okay for grad day activities. Once in day room, PTA attempted to encourage pt to stand with various methods such as using hands/cones for external targets so pt can reach forward to stand, but pt presented in same manner with only one attempt during entire time with pt ultimately saying no sternly when asked if pt wanted to participate any further. PTA asked pt if pt wanted to return to room with pt saying yes. Pt transported back to room with session ending early due to pt's lack of initiation/participation/request.   Patient sitting in WC at end of session with brakes locked, belt alarm set, and all needs within reach.      Therapy Documentation Precautions:  Precautions Precautions: Fall Recall of Precautions/Restrictions: Impaired Precaution/Restrictions Comments:  L hemiparesis, h/o dementia Restrictions Weight Bearing Restrictions Per Provider Order: No   Therapy/Group: Individual Therapy  Ralf Konopka PTA 07/30/2023, 12:14 PM

## 2023-07-30 NOTE — Progress Notes (Signed)
 Occupational Therapy Session Note  Patient Details  Name: Kathryn Mcguire MRN: [id number] Date of Birth: 1950/08/16  Today's Date: 07/30/2023 OT Individual Time: 1410-1505 OT Individual Time Calculation (min): 55 min    Short Term Goals: Week 1:  OT Short Term Goal 1 (Week 1): Patient will trasnfer to toilet with mod assist OT Short Term Goal 1 - Progress (Week 1): Met OT Short Term Goal 2 (Week 1): Patient will complete clothing management during toileting wiht mod assist OT Short Term Goal 2 - Progress (Week 1): Not met OT Short Term Goal 3 (Week 1): Patient will don pull over shirt with min assist following set up OT Short Term Goal 3 - Progress (Week 1): Not met OT Short Term Goal 4 (Week 1): Patient will visually locate needed ADL items left of midline 5* with min cueing OT Short Term Goal 4 - Progress (Week 1): Not met Week 2:  OT Short Term Goal 1 (Week 2): STG=LTG d/t ELOS   Skilled Therapeutic Interventions/Progress Updates:    1:! Pt received in the w/c asleep coming forward out of the chair. Self care training with focus on transfers, sustained attention to task, attention to the left body and visual. Pt with difficulty initiating transferring to the toilet after extra time and different types of cues. Pt ultimately needed total A +2 to transfer to the toilet; max A for tolieting . Pt was able to perform hygiene in sitting. Then pt was able to transfer with min A to the w/c and then transferred into the shower to cut out tub bench with max A. Pt required mod A to wash body with total A cuing for sequencing and attention left body. Mod A transfer back into the w/c. Pt initiated to try to dress but required assist to carry out task with backward chaining. Pt initiated a stand at the sink with min A for cleaning of the buttocks after some incontinence of bowel. But then when standing a second time pt unable to come into standing. Had to transfer into bed with max A +2  and then pull  up clothing. Pt reporting being tired this afternoon. Her overall performance today was worse than her usual performance. Pt left resting in the bed.   Therapy Documentation Precautions:  Precautions Precautions: Fall Recall of Precautions/Restrictions: Impaired Precaution/Restrictions Comments: L hemiparesis, h/o dementia Restrictions Weight Bearing Restrictions Per Provider Order: No  Pain:  No c/o pain  ADL: ADL Eating: Minimal assistance Grooming: Moderate assistance Where Assessed-Grooming: Sitting at sink, Standing at sink Upper Body Bathing: Moderate assistance Where Assessed-Upper Body Bathing: Shower Lower Body Bathing: Maximal assistance Where Assessed-Lower Body Bathing: Shower Upper Body Dressing: Maximal assistance Where Assessed-Upper Body Dressing: Chair Lower Body Dressing: Maximal assistance Where Assessed-Lower Body Dressing: Sitting at sink, Standing at sink Toileting: Maximal assistance Where Assessed-Toileting: Teacher, adult education: Minimal assistance, Maximal verbal cueing Toilet Transfer Method: Stand pivot Acupuncturist: Grab bars Tub/Shower Transfer: Unable to assess Tub/Shower Transfer Method: Unable to assess [employer] Transfer: Minimal assistance, Maximal cueing Film/video editor Method: Warden/ranger: Sales promotion account executive Additional Comments: impacted by left inattetion/ neglect Perception  Perception: Impaired Perception-Other Comments: L inattention Praxis Praxis: Impaired Praxis Impairment Details: Initiation;Ideomotor;Perseveration;Motor planning;Organization;Limb apraxia Balance Static Sitting Balance Static Sitting - Level of Assistance: 5: Stand by assistance Dynamic Sitting Balance Dynamic Sitting - Balance Support: During functional activity;Feet supported Dynamic Sitting - Level of Assistance: 4: Min assist Static Standing Balance Static Standing -  Balance Support: During  functional activity Static Standing - Level of Assistance: 3: Mod assist   Therapy/Group: Individual Therapy  Claudene Nest [facility] 07/30/2023, 3:22 PM

## 2023-07-30 NOTE — Progress Notes (Signed)
 Occupational Therapy Discharge Summary  Patient Details  Name: Kathryn Mcguire MRN: [id number] Date of Birth: 08-Apr-1950  Date of Discharge from OT service:July 30, 2023   Patient has met {NUMBERS 0-12:18577} of {NUMBERS 0-12:18577} long term goals due to {due to:3041651}.  Patient to discharge at overall {LOA:3049010} level.  Patient's care partner {care partner:3041650} to provide the necessary {assistance:3041652} assistance at discharge.    Reasons goals not met: ***  Recommendation:  Patient will benefit from ongoing skilled OT services in {setting:3041680} to continue to advance functional skills in the area of {ADL/iADL:3041649}.  Equipment: {equipment:3041657}  Reasons for discharge: {Reason for discharge:3049018}  Patient/family agrees with progress made and goals achieved: {Pt/Family agree with progress/goals:3049020}  OT Discharge Precautions/Restrictions  Precautions Precautions: Fall Precaution/Restrictions Comments: L hemiparesis, h/o dementia Restrictions Weight Bearing Restrictions Per Provider Order: No General PT Missed Treatment Reason: Patient unwilling to participate Vital Signs Therapy Vitals Temp: 98.1 F (36.7 C) Pulse Rate: 84 Resp: 16 BP: 101/73 Patient Position (if appropriate): Sitting Oxygen Therapy SpO2: 100 % Pain   ADL ADL Eating: Minimal assistance Grooming: Moderate assistance Where Assessed-Grooming: Sitting at sink, Standing at sink Upper Body Bathing: Moderate assistance Where Assessed-Upper Body Bathing: Shower Lower Body Bathing: Maximal assistance Where Assessed-Lower Body Bathing: Shower Upper Body Dressing: Maximal assistance Where Assessed-Upper Body Dressing: Chair Lower Body Dressing: Maximal assistance Where Assessed-Lower Body Dressing: Sitting at sink, Standing at sink Toileting: Maximal assistance Where Assessed-Toileting: Teacher, adult education: Minimal assistance, Maximal verbal cueing Toilet Transfer  Method: Stand pivot Acupuncturist: Grab bars Tub/Shower Transfer: Unable to assess Tub/Shower Transfer Method: Unable to assess [employer] Transfer: Minimal assistance, Maximal cueing Film/video editor Method: Warden/ranger: Sales promotion account executive Additional Comments: impacted by left inattetion/ neglect Perception  Perception: Impaired Praxis Praxis: Impaired Praxis Impairment Details: Initiation;Ideomotor;Perseveration;Motor planning;Organization;Limb apraxia Cognition Cognition Overall Cognitive Status: Impaired/Different from baseline Arousal/Alertness: Awake/alert Orientation Level: Person;Place;Situation Person: Oriented Place: Oriented Situation: Oriented Memory: Impaired Memory Impairment: Retrieval deficit;Decreased recall of new information;Decreased long term memory;Decreased short term memory;Storage deficit Attention: Sustained Focused Attention: Impaired Focused Attention Impairment: Verbal basic;Functional basic Sustained Attention: Impaired Sustained Attention Impairment: Verbal basic;Functional basic Awareness: Impaired Awareness Impairment: Intellectual impairment Problem Solving: Impaired Problem Solving Impairment: Verbal basic;Functional basic Executive Function:  (all inpaired due to lower level deficits) Safety/Judgment: Impaired Comments: L inattention/ left neglect Brief Interview for Mental Status (BIMS) Repetition of Three Words (First Attempt): 3 Temporal Orientation: Year: Correct Temporal Orientation: Month: Accurate within 5 days Temporal Orientation: Day: Correct Recall: Sock: No, could not recall Recall: Blue: Yes, after cueing (a color) Recall: Bed: Yes, after cueing (a piece of furniture) BIMS Summary Score: 11 Sensation Sensation Light Touch: Impaired by gross assessment Light Touch Impaired Details: Impaired LUE;Impaired LLE;Absent LUE Hot/Cold: Impaired by gross  assessment Proprioception: Impaired by gross assessment Proprioception Impaired Details: Impaired LLE;Absent LUE Coordination Gross Motor Movements are Fluid and Coordinated: Yes Fine Motor Movements are Fluid and Coordinated: No Finger Nose Finger Test: Modified testing - unilateral with RUE - Appears WFL - difficult following instructions Motor  Motor Motor: Hemiplegia;Abnormal tone;Motor impersistence;Motor perseverations;Abnormal postural alignment and control;Motor apraxia Mobility  Transfers Sit to Stand: Minimal Assistance - Patient > 75% Stand to Sit: Minimal Assistance - Patient > 75%  Trunk/Postural Assessment  Cervical Assessment Cervical Assessment:  (forward flexion and turned to the right) Thoracic Assessment Thoracic Assessment:  (rounded shoulders) Lumbar Assessment Lumbar Assessment:  (posterior pelvic tilt) Postural Control Postural Control: Deficits on evaluation Trunk Control:  fair Righting Reactions: Decreased. While in stance pt has anterior L lean. Protective Responses: absent to delayed  Balance Balance Balance Assessed: Yes Static Sitting Balance Static Sitting - Balance Support: Feet supported Static Sitting - Level of Assistance: 5: Stand by assistance Dynamic Sitting Balance Dynamic Sitting - Balance Support: During functional activity;Feet supported Dynamic Sitting - Level of Assistance: 4: Min Oncologist Standing - Balance Support: During functional activity Static Standing - Level of Assistance: 3: Mod assist Dynamic Standing Balance Dynamic Standing - Balance Support: No upper extremity supported Dynamic Standing - Level of Assistance: 4: Min assist Extremity/Trunk Assessment RUE Assessment RUE Assessment: Within Functional Limits LUE Assessment LUE Assessment: Exceptions to [facility] LUE Body System: Neuro Brunstrum level for arm: Stage I Presynergy Brunstrum level for hand: Stage I Flaccidity LUE Tone LUE Tone:  Moderate;Modified Ashworth Body Part - Modified Ashworth Scale: Elbow;Wrist;Fingers;Thumb Elbow - Modified Ashworth Scale for Grading Hypertonia LUE: Slight increase in muscle tone, manifested by a catch, followed by minimal resistance throughout the remainder (less than half) of the ROM Wrist - Modified Ashworth Scale for Grading Hypertonia LUE: Slight increase in muscle tone, manifested by a catch and release or by minimal resistance at the end of the range of motion when the affected part(s) is moved in flexion or extension Fingers - Modified Ashworth Scale for Grading Hypertonia LUE: No increase in muscle tone Thumb - Modified Ashworth Scale for Grading Hypertonia LUE: No increase in muscle tone   Claudene Nest [facility] 07/30/2023, 3:01 PM

## 2023-07-30 NOTE — Plan of Care (Signed)
  Problem: RH Swallowing Goal: LTG Patient will consume least restrictive diet using compensatory strategies with assistance (SLP) Description: LTG:  Patient will consume least restrictive diet using compensatory strategies with assistance (SLP) Outcome: Completed/Met   Problem: RH Cognition - SLP Goal: RH LTG Patient will demonstrate orientation with cues Description:  LTG:  Patient will demonstrate orientation to person/place/time/situation with cues (SLP)   Outcome: Completed/Met   Problem: RH Attention Goal: LTG Patient will demonstrate this level of attention during functional activites (SLP) Description: LTG:  Patient will will demonstrate this level of attention during functional activites (SLP) Outcome: Completed/Met   Problem: RH Problem Solving Goal: LTG Patient will demonstrate problem solving for (SLP) Description: LTG:  Patient will demonstrate problem solving for basic/complex daily situations with cues  (SLP) Outcome: Not Met (unable to assess patients current functioning due to lack of participation)   Problem: RH Memory Goal: LTG Patient will use memory compensatory aids to (SLP) Description: LTG:  Patient will use memory compensatory aids to recall biographical/new, daily complex information with cues (SLP) Outcome: Not Met (unable to assess patients current functioning due to lack of participation)   Problem: RH Awareness Goal: LTG: Patient will demonstrate awareness during functional activites type of (SLP) Description: LTG: Patient will demonstrate awareness during functional activites type of (SLP) Outcome: Not Met (unable to assess patients current functioning due to lack of participation)

## 2023-07-30 NOTE — Progress Notes (Signed)
 Speech Language Pathology Discharge Summary  Patient Details  Name: Kathryn Mcguire MRN: [id number] Date of Birth: [birth year]  Date of Discharge from SLP service:July 30, 2023  Today's Date: 07/30/2023 SLP Individual Time: 1045-1100 SLP Individual Time Calculation (min): 15 min   Skilled Therapeutic Interventions:   Skilled therapy session focused on cognitive goals. Patient oriented to self, situation and location independently and time given minA. SLP attempted to re-evaluate patients cognitive linguistic skills through completion of the Cognistat standardized assessment, though patient unwilling to participate. Patient answered I don't know to every question posed by SLP. Unable to truly assess cognitive functioning at d/c due to patients lack of participation the last several ST sessions. 45 minutes of session missed due to patient refusal. Patient left in chair with alarm set and call bell in reach. Continue POC.      Patient has met 3 of 6 long term goals.  Patient to discharge at overall Mod;Max;Min level.  Reasons goals not met: patient refusal to participate in tx   Clinical Impression/Discharge Summary: Pt has made limited gains and has met 3 of 6 LTG's this admission due to improved  dysphagia and cognition. Pt is currently an overall minA for orientation and mod A for simple L attention. Difficult to evaluate patients intellectual awareness, problem solving and memory skills as patient with continuous refusal to participate in these tasks. Patient requires min cues for utilization of swallowing compensatory strategies to minimize overt s/sx of aspiration with D2/thin diet. Pt/family education complete and pt will discharge home with 24 hour supervision from friends/family/etc. Pt would benefit from [facility] f/u ST services to maximize cognition and dysphagia in order to maximize functional independence.   Care Partner:  Caregiver Able to Provide Assistance: Yes  Type of Caregiver  Assistance: Physical;Cognitive  Recommendation:  Home Health SLP;Outpatient SLP;24 hour supervision/assistance  Rationale for SLP Follow Up: Maximize cognitive function and independence;Maximize swallowing safety   Equipment: n/a   Reasons for discharge: Discharged from hospital   Patient/Family Agrees with Progress Made and Goals Achieved: Yes    Zaydee Aina M.A., CCC-SLP 07/30/2023, 11:05 AM

## 2023-07-30 NOTE — Progress Notes (Signed)
 Patient ID: Comer DELENA Pinal, female   DOB: April 11, 1950, 73 y.o.   MRN: [id number]  Sw received message from pt dtr [place] stating to disregard first message as they were able to find a TTB.   1410-SW returned phone call to pt dtr [name] to discuss DME recs. SW will order 3in1 BSC., and confirm other DME listed- hospital bed and transport chair vs w/c. No HHA preference.   SW sent HHPT/OT/SLP/aide referral to [facility] and waiting on follow-up.   Therapy team confirms transport chair needed. SW ordered DME with [employer] via parachute.   1442- SW called pt dtr to inform on above about DME.  Graeme Jude, MSW, LCSW Office: [phone] Cell: [phone] Fax: [phone]

## 2023-07-31 ENCOUNTER — Telehealth: Payer: Self-pay

## 2023-07-31 ENCOUNTER — Other Ambulatory Visit (HOSPITAL_COMMUNITY): Payer: Self-pay

## 2023-07-31 LAB — GLUCOSE, CAPILLARY: Glucose-Capillary: 174 mg/dL — ABNORMAL HIGH (ref 70–99)

## 2023-07-31 NOTE — Discharge Summary (Signed)
 Physician Discharge Summary  Patient ID: Kathryn Mcguire MRN: [id number] DOB/AGE: 15-May-1950 73 y.o.  Admit date: 07/16/2023 Discharge date: 07/31/2023  Discharge Diagnoses:  Principal Problem:   Acute ischemic right MCA stroke [facility]) Active Problems:   COPD (chronic obstructive pulmonary disease) (HCC)   Long-term insulin  use (HCC)   DM (diabetes mellitus) (HCC)   Tobacco abuse disorder   Dysphagia   Hemi-neglect of left side   Diarrhea   Discharged Condition: stable  Significant Diagnostic Studies: N/A   Labs:  Basic Metabolic Panel:    Latest Ref Rng & Units 07/30/2023    7:17 PM 07/29/2023    5:18 AM 07/23/2023    6:45 PM  BMP  Glucose 70 - 99 mg/dL 746  849  838   BUN 8 - 23 mg/dL 21  15  16    Creatinine 0.44 - 1.00 mg/dL 9.05  9.16  9.00   Sodium 135 - 145 mmol/L 136  137  135   Potassium 3.5 - 5.1 mmol/L 4.1  4.4  4.3   Chloride 98 - 111 mmol/L 102  104  98   CO2 22 - 32 mmol/L 26  26  25    Calcium  8.9 - 10.3 mg/dL 9.1  9.7  9.9      CBC: Recent Labs  Lab 07/25/23 0458 07/29/23 0518  WBC 8.2 8.2  HGB 12.5 12.4  HCT 39.0 38.8  MCV 81.3 80.8  PLT 314 308    CBG: Recent Labs  Lab 07/30/23 0612 07/30/23 1127 07/30/23 1703 07/30/23 2058 07/31/23 0611  GLUCAP 163* 205* 259* 210* 174*    Brief HPI:   Kathryn Mcguire is a 73 y.o. female with history of T2DM, HTN, COPD, bipolar disorder, dementia who was admitted on 07/11/2023 with acute onset of left-sided weakness with facial droop, right gaze preference as well as fall.  CTA head/neck showed occlusion of right ICA from proximal cervical segment to cavernous segment, decrease in the caliber of right MCA branches and right ACA and she received TNK.  Family declines revascularization.  MRI brain done revealing scattered areas of acute infarct in left basal ganglia and within posterior aspect left ACA territory, mild edema and sulcal effacement right frontoparietal lobes.  Stroke felt to be due to large  vessel disease and Dr. Jerri recommended DAPT x 3 months followed by aspirin  alone.    NG tube was placed for nutritional support and MBS done to evaluate swallow.  Diet has been advanced today 1, thin liquids with supervision.  She continued to be have bouts of lethargy and was limited by left-sided weakness with right wrist preference, left inattention, decline in baseline cognitive function, had left lateral lean. Therapy was working with patient who was requiring mod to max assist with ADLs and mod assist +2 for stand pivot transfers.  She was independent prior to admission and CIR was recommended due to functional decline.   Hospital Course: Kathryn Mcguire was admitted to rehab 07/16/2023 for inpatient therapies to consist of PT, ST and OT at least three hours five days a week. Past admission physiatrist, therapy team and rehab RN have worked together to provide customized collaborative inpatient rehab. She was maintained on DAPT during her stay and has tolerated this without side effects.  Follow-up CBC shows H&H and platelets to be stable.  Leukocytosis has resolved.  WHO was ordered for support of LUE with emerging tone.  Her blood pressures were monitored on TID basis and were noted to be  on the low side.  IV fluids were completed after admission when p.o. fluids were encouraged.  Blood pressures are controlled without medication.  P.o. intake has gradually improved and diet upgraded due to with thin liquids.  Ritalin  was trialed briefly but it was ineffective therefore discontinued.  She continues to have issues with impulsivity as well as pocketing therefore chopped foods recommended with supervision and limited distraction.  Her diabetes has been monitored with ac/hs CBG checks and SSI was use prn for tighter BS control.  Blood sugar started trending up therefore 70/30 insulin  was resumed, glyburide  was added and titrated up to 5 mg daily.  Metformin  was discontinued due to  diarrheal side effects.  Fibercon was added for bulking stools. Her po intake has been good.  She has made gains during his stay but continues to fluctuate greatly. She requires min to mod assist due to dementia, severe left inattention as well as decrease in proprioception.  She will continue to receive follow-up home health PT, OT, ST and aide by [facility] home health after discharge.   Rehab course: During patient's stay in rehab weekly team conferences were held to monitor patient's progress, set goals and discuss barriers to discharge. At admission, patient required max assist with ADL tasks and mod assist with mobility.  She exhibited oropharyngeal dysphagia with significantly decreased cognitive functioning per family.  Slums score noted to be at 9 out of 30 showing moderate problem-solving deficits with severe delayed recall, attention and decreased awareness of deficits. She  has had improvement in activity tolerance, balance, postural control as well as ability to compensate for deficits.  She can bathe and shower with min assist however functional status fluctuates greatly.  Pushing syndrome at times and can require up to total assist.  She requires mod assist for basic ADL tasks overall.  She requires min assist for bed mobility, contact-guard to min assist for transfers and is able to ambulate up 250 feet.  Mod assist.  She requires contact-guard assist for balance but requires max cueing for safety a due to significant left neglect.  She requires min assist for orientation mod assist for left inattention.  She has refused to participate in problem-solving and memory tasks and cognition is difficult to determine.  She requires min cues for safe swallow strategies and continues on D2 diet for safety.  Family education has been completed.  Discharge disposition: 06-Home-Health Care Svc  Diet: D2, thin liquids. Carb modified restrictions  Special Instructions: DAPT to continue for 3 months total followed by aspirin   alone. 2.  Monitor blood sugars 2-4 times a day and follow-up with PCP for adjustment in medications. 3.  Family to manage and administer medications.   Discharge Instructions     Ambulatory referral to Neurology   Complete by: As directed    An appointment is requested in approximately: 4 weeks   Ambulatory referral to Physical Medicine Rehab   Complete by: As directed       Allergies as of 07/31/2023   No Known Allergies      Medication List     STOP taking these medications    enoxaparin  40 MG/0.4ML injection Commonly known as: LOVENOX    feeding supplement Liqd   insulin  aspart 100 UNIT/ML injection Commonly known as: novoLOG    metFORMIN  1000 MG tablet Commonly known as: GLUCOPHAGE        TAKE these medications    acetaminophen  325 MG tablet Commonly known as: TYLENOL  Take 1-2 tablets (325-650 mg total) by  mouth every 4 (four) hours as needed for mild pain (pain score 1-3). What changed:  how much to take reasons to take this   Aspirin  Low Dose 81 MG chewable tablet Generic drug: aspirin  Chew 1 tablet (81 mg total) by mouth daily.   atorvastatin  40 MG tablet Commonly known as: LIPITOR Take 1 tablet (40 mg total) by mouth daily.   clopidogrel  75 MG tablet Commonly known as: PLAVIX  Take 1 tablet (75 mg total) by mouth daily.   donepezil  5 MG tablet Commonly known as: ARICEPT  Take 1 tablet (5 mg total) by mouth at bedtime.   Embecta Pen Needle Nano 32G X 4 MM Misc Generic drug: Insulin  Pen Needle Use 2 (two) times daily.   FT Fiber Laxative 625 MG tablet Generic drug: polycarbophil Take 1 tablet (625 mg total) by mouth daily.   glyBURIDE  5 MG tablet Commonly known as: DIABETA  Take 1 tablet (5 mg total) by mouth daily with breakfast.   NovoLOG  Mix 70/30 FlexPen (70-30) 100 UNIT/ML FlexPen Generic drug: insulin  aspart protamine - aspart Inject 6 Units into the skin 2 (two) times daily with a meal.   pantoprazole  40 MG tablet Commonly known  as: PROTONIX  Take 1 tablet (40 mg total) by mouth daily with supper.   senna-docusate 8.6-50 MG tablet Commonly known as: Senokot-S Take 1 tablet by mouth at bedtime as needed for mild constipation.        Follow-up Information     [name], [name] C, NP Follow up.   Specialty: Family Medicine Why: Call in 1-2 days for post hospital follow up Contact information: [address] [place] [address] [phone]         Carilyn Prentice BRAVO, MD Follow up.   Specialty: Physical Medicine and Rehabilitation Why: office will call you with follow up appointment Contact information: [address] [place] [address] [phone]         [facility] Follow up.   Why: office will call you with follow up appointment Contact information: [address]     [address] [place]  [address] [phone]                Signed: Sharlet GORMAN Schmitz 08/01/2023, 4:32 PM

## 2023-07-31 NOTE — Progress Notes (Signed)
 Inpatient Rehabilitation Discharge Medication Review by a Pharmacist  A complete drug regimen review was completed for this patient to identify any potential clinically significant medication issues.  High Risk Drug Classes Is patient taking? Indication by Medication  Antipsychotic No   Anticoagulant No   Antibiotic No   Opioid No   Antiplatelet Yes Aspirin  81 mg and clopidogrel  for 3 months, then Aspirin  alone - CVA prophylaxis  Hypoglycemics/insulin  Yes Insulin  70/30, glyburide  - DM Type 2  Vasoactive Medication No   Chemotherapy No   Other Yes Atorvastatin  - hyperlipidemia Donepezil  - dementia Pantoprazole  - GERD Polycarbophil, senna-docusate - laxatives  PRNs: Acetaminophen  - mild pain     Type of Medication Issue Identified Description of Issue Recommendation(s)  Drug Interaction(s) (clinically significant)     Duplicate Therapy     Allergy     No Medication Administration End Date  Aspirin  and Clopidogrel  planned for 3 months, then Aspirin  alone. [name] 7/18. Expect Clopidogrel  to stop 10/26/23.  Incorrect Dose     Additional Drug Therapy Needed     Significant med changes from prior encounter (inform family/care partners about these prior to discharge). New Clopidogrel  during inpatient stay. New Glyburide , Polycarbophil.  Metformin  discontinued due to loose stools. Communicate changes with patient/family prior to discharge.  Other       Clinically significant medication issues were identified that warrant physician communication and completion of prescribed/recommended actions by midnight of the next day:  No   Pharmacist comments:  - Aspirin  and Clopidogrel  x 3 months, then Aspirin  alone. Begun 07/26/23, expect stop date 10/26/23.  Time spent performing this drug regimen review (minutes):  20  Kathryn Mcguire, [place] 07/31/2023 9:19 AM

## 2023-07-31 NOTE — Plan of Care (Signed)
  Problem: Education: Goal: Ability to describe self-care measures that may prevent or decrease complications (Diabetes Survival Skills Education) will improve Outcome: Progressing Goal: Individualized Educational Video(s) Outcome: Progressing   Problem: Coping: Goal: Ability to adjust to condition or change in health will improve Outcome: Progressing   Problem: Fluid Volume: Goal: Ability to maintain a balanced intake and output will improve Outcome: Progressing   Problem: Health Behavior/Discharge Planning: Goal: Ability to identify and utilize available resources and services will improve Outcome: Progressing Goal: Ability to manage health-related needs will improve Outcome: Progressing   Problem: Metabolic: Goal: Ability to maintain appropriate glucose levels will improve Outcome: Progressing   Problem: Nutritional: Goal: Maintenance of adequate nutrition will improve Outcome: Progressing Goal: Progress toward achieving an optimal weight will improve Outcome: Progressing   Problem: Skin Integrity: Goal: Risk for impaired skin integrity will decrease Outcome: Progressing   Problem: Tissue Perfusion: Goal: Adequacy of tissue perfusion will improve Outcome: Progressing   Problem: RH Vision Goal: RH LTG Vision (Specify) Outcome: Progressing

## 2023-07-31 NOTE — Telephone Encounter (Signed)
 Noted

## 2023-07-31 NOTE — Telephone Encounter (Signed)
 Copied from CRM [id number]. Topic: General - Other >> Jul 31, 2023  3:14 PM Diannia H wrote: Reason for CRM: Edsel the patients daughter called and wanted th provider to know that the hospital already completed her paperwork for [facility] and she can disregard in and doesn't have to complete it, she will keep her appointment for tomorrow.

## 2023-07-31 NOTE — Progress Notes (Addendum)
 Patient ID: Comer DELENA Pinal, female   DOB: [birth year], 73 y.o.   MRN: [id number]   SW received FMLA forms this morning from pt dtr [name]. SW left message for pt dtr reporting forms were received, and these forms can take 7-10 business days to complete and will be faxed once PA completes.   SW left forms for PA-Pam.   *[facility] declined due to insurance being out-of-network.   SW sent HHA referral to Lynette[facility].  *referral accepted.   SW faxed FMLA forms to [facility] [employer] [phone].  SW spoke with pt dtr [name] to discuss above. SW provided contact information for HHA. SW shared hospital bed is not needed as long as you have bed rails that is suffice. SW emailed her FMLA forms to - [email or web address] .  Graeme Jude, MSW, LCSW Office: [phone] Cell: [phone] Fax: [phone]

## 2023-07-31 NOTE — Progress Notes (Signed)
 PROGRESS NOTE   Subjective/Complaints:  Poor CNA initiation poor , off methylphenidate  x 1 wk  CBGs up but now taking 100% meals plus Ensure max BID   ROS: Limited due to cognitive/behavioral   Objective:   No results found. Recent Labs    07/29/23 0518  WBC 8.2  HGB 12.4  HCT 38.8  PLT 308     Recent Labs    07/29/23 0518 07/30/23 1917  NA 137 136  K 4.4 4.1  CL 104 102  CO2 26 26  GLUCOSE 150* 253*  BUN 15 21  CREATININE 0.83 0.94  CALCIUM  9.7 9.1      Intake/Output Summary (Last 24 hours) at 07/31/2023 0800 Last data filed at 07/30/2023 1228 Gross per 24 hour  Intake 476 ml  Output --  Net 476 ml        Physical Exam: Vital Signs Blood pressure 119/85, pulse 64, temperature 98.4 F (36.9 C), temperature source Oral, resp. rate 17, height 5' 3 (1.6 m), weight 54.4 kg, SpO2 100%.    General: No acute distress Mood and affect are appropriate Heart: Regular rate and rhythm no rubs murmurs or extra sounds Lungs: Clear to auscultation, breathing unlabored, no rales or wheezes Abdomen: Positive bowel sounds, soft nontender to palpation, nondistended Extremities: No clubbing, cyanosis, or edema Skin: No evidence of breakdown, no evidence of rash Neurologic:Oriented to person, [facility], stroke again, sometimes needs cues. . Cranial nerves II through XII intact, motor strength is 4/5 in Right hand. 0/5 left deltoid, bicep, tricep, grip. Can 4/5 left hip flexors, knee extensors, ankle dorsiflexor and plantar flexors but was not consistent during MMT. She  exhibits early tone in LUE: tr pecs, tr-1 biceps, tr wrist flexors.  Sensory exam normal sensation to light touch and proprioception in bilateral upper and lower extremities Cerebellar exam normal finger to nose to finger as well as heel to shin in bilateral upper and lower extremities She demonstrated more difficulty maintaining upright seated position  today. I repositioned her, and she gradually began sagging to left again.  Musculoskeletal: Full range of motion in all 4 extremities. No joint swelling    Assessment/Plan: 1. Functional deficits due to Right CVA Stable for D/C today F/u PCP in 3-4 weeks F/u PM&R 2 weeks F/u neuro 1-2 mo  See D/C summary See D/C instructions   Care Tool:  Bathing    Body parts bathed by patient: Face, Chest, Front perineal area, Abdomen, Right upper leg, Left upper leg   Body parts bathed by helper: Right arm, Left arm, Buttocks, Right lower leg, Left lower leg     Bathing assist Assist Level: Moderate Assistance - Patient 50 - 74%     Upper Body Dressing/Undressing Upper body dressing   What is the patient wearing?: Pull over shirt    Upper body assist Assist Level: Maximal Assistance - Patient 25 - 49%    Lower Body Dressing/Undressing Lower body dressing      What is the patient wearing?: Pants, Incontinence brief     Lower body assist Assist for lower body dressing: Total Assistance - Patient < 25%     Toileting Toileting Toileting Activity did not  occur Press photographer and hygiene only): N/A (no void or bm)  Toileting assist Assist for toileting: Maximal Assistance - Patient 25 - 49%     Transfers Chair/bed transfer  Transfers assist  Chair/bed transfer activity did not occur: Safety/medical concerns  Chair/bed transfer assist level: Minimal Assistance - Patient > 75%     Locomotion Ambulation   Ambulation assist      Assist level: Moderate Assistance - Patient 50 - 74% (CGA/minA to maintain standing balance, but modA due to poor safety awareness/attention to L side) Assistive device: Hand held assist Max distance: 150   Walk 10 feet activity   Assist     Assist level: Contact Guard/Touching assist Assistive device: Hand held assist   Walk 50 feet activity   Assist    Assist level: Moderate Assistance - Patient - 50 - 74% (CGA/minA to  maintain standing balance, but modA due to poor safety awareness/attention to L side) Assistive device: Hand held assist    Walk 150 feet activity   Assist Walk 150 feet activity did not occur: Safety/medical concerns  Assist level: Moderate Assistance - Patient - 50 - 74% (CGA/minA to maintain standing balance, but modA due to poor safety awareness/attention to L side) Assistive device: Hand held assist    Walk 10 feet on uneven surface  activity   Assist Walk 10 feet on uneven surfaces activity did not occur: Safety/medical concerns   Assist level: Moderate Assistance - Patient - 50 - 74% (CGA/minA to maintain standing balance, but modA due to poor safety awareness/attention to L side) Assistive device: Hand held assist   Wheelchair     Assist Is the patient using a wheelchair?: No Type of Wheelchair: Manual    Wheelchair assist level: Dependent - Patient 0%      Wheelchair 50 feet with 2 turns activity    Assist        Assist Level: Dependent - Patient 0%   Wheelchair 150 feet activity     Assist      Assist Level: Dependent - Patient 0%   Blood pressure 119/85, pulse 64, temperature 98.4 F (36.9 C), temperature source Oral, resp. rate 17, height 5' 3 (1.6 m), weight 54.4 kg, SpO2 100%.  Medical Problem List and Plan: 1. 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  Antithrombotics: -DVT/anticoagulation:  Pharmaceutical: Lovenox  40mg  daily             -antiplatelet therapy: DAPT X 3 months followed by ASA alone.  3. Pain Management/spasticity: tylenol  prn -7/15 pt with early flexor tone of LUE. It is mild, and I think at this point, general ROM and positioning is adequate.    -ordered WHO for LUE 4. Mood/Behavior/Sleep: LCSW to follow for evaluation and  support.              --does get up 3-4 times every night (food/tobacco)             -antipsychotic agents: N/a 5. Neuropsych/cognition: This patient is not capable of making decisions on her own behalf. 6. Skin/Wound Care: Routine pressure relief measures.  7. Fluids/Electrolytes/Nutrition: Monitor I/O. Improved  8.  T2DM: Hgb A1c- 12.3. Monitor BS ac/hs and use SSI  for elevated BS             --Was on 70/30 insulin  6 units bid and metformin  1000 mg bid  -Will restart 70/30 insulin  at 6u bid on 7/10, improved  -will add glyburide  2.5mg  daily and dc metformin  given recent loose stool -  -increase glyburide  to 5mg  daily       CBG (last 3)  Recent Labs    07/30/23 1703 07/30/23 2058 07/31/23 0611  GLUCAP 259* 210* 174*  Generally controlled on current dosing of insulin , DiaBeta  recently increased no further changes prior to discharge Intake is improving to 100% but still getting Ensure max will d/c ensure  9. 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  -continue supplements  10. H/o bipolar d/o: Used to have manic episodes/wander, etc but stable off medications.   11. Hyperlipidemia: Continue increased dose lipitor 40mg  daily  12. Dementia: On Aricept  5mg  nightly (followed by Dr. Georjean in the past) -per daughters pt was generally independent within the home, needing  supervision with bathing and some ADL's for safety.   13. COPD/Tobacco use: Chews 4 packs of tobacco daily.    No cravings  14. Blood pressure: Monitor TID--keep SBP > 110. --avoid hypotension given R-ICA occlusion.  May d/c IVF after current bag -7/15 bp's soft--seems to be tolerating---no changes for now -push fluids.  -07/29/23 BPs under conrol Vitals:   07/27/23 0643 07/27/23 1604 07/27/23 2000 07/28/23 0606  BP: 116/74 (!) 131/91 106/60 108/68   07/28/23 1348 07/29/23 0450 07/29/23 1244 07/29/23 2000  BP: 120/61 129/63 94/71 112/65    07/30/23 0447 07/30/23 1252 07/30/23 2023 07/31/23 0436  BP: 107/61 101/73 103/69 119/85    15. Mild leucocytosis: resolved    Latest Ref Rng & Units 07/29/2023    5:18 AM 07/25/2023    4:58 AM 07/22/2023    5:15 AM  CBC  WBC 4.0 - 10.5 K/uL 8.2  8.2  9.0   Hemoglobin 12.0 - 15.0 g/dL 87.5  87.4  88.0   Hematocrit 36.0 - 46.0 % 38.8  39.0  36.8   Platelets 150 - 400 K/uL 308  314  290     16. Mild hyponatremia: resolved     Latest Ref Rng & Units 07/30/2023    7:17 PM 07/29/2023    5:18 AM 07/23/2023    6:45 PM  BMP  Glucose 70 - 99 mg/dL 746  849  838   BUN 8 - 23 mg/dL 21  15  16    Creatinine 0.44 - 1.00 mg/dL 9.05  9.16  9.00   Sodium 135 - 145 mmol/L 136  137  135   Potassium 3.5 - 5.1 mmol/L 4.1  4.4  4.3   Chloride 98 - 111 mmol/L 102  104  98   CO2 22 - 32 mmol/L 26  26  25    Calcium  8.9 - 10.3 mg/dL 9.1  9.7  9.9     17. Diarrhea: type 6 , d/c ensure   LOS: 15 days A FACE TO FACE EVALUATION WAS PERFORMED  Prentice BRAVO Jolana Runkles 07/31/2023, 8:00 AM

## 2023-08-01 ENCOUNTER — Encounter: Admitting: Family

## 2023-08-01 ENCOUNTER — Ambulatory Visit: Admitting: Family

## 2023-08-01 DIAGNOSIS — R414 Neurologic neglect syndrome: Secondary | ICD-10-CM | POA: Insufficient documentation

## 2023-08-01 DIAGNOSIS — R197 Diarrhea, unspecified: Secondary | ICD-10-CM | POA: Insufficient documentation

## 2023-08-01 NOTE — Progress Notes (Signed)
   This service is provided via telemedicine  No vital signs collected/recorded due to the encounter was a telemedicine visit.   Location of patient (ex: home, work):  Home  Patient consents to a telephone visit: Yes  Location of the provider (ex: office, home):  [facility], Office   Name of any referring provider:  N/A  Names of all persons participating in the telemedicine service and their role in the encounter:  Ronald Pippins, CMA, Patient, and  [name], [name], NP  Time spent on call:  9 min with medical assistant

## 2023-08-01 NOTE — Progress Notes (Signed)
 Inpatient Rehabilitation Care Coordinator Discharge Note   Patient Details  Name: Kathryn Mcguire MRN: [id number] Date of Birth: [birth year]   Discharge location: D/c to home with daughter and vsarious family support  Length of Stay: 14 days  Discharge activity level: Supervision  Home/community participation: Limited  Patient response un:Yzjouy Literacy - How often do you need to have someone help you when you read instructions, pamphlets, or other written material from your doctor or pharmacy?: Patient unable to respond  Patient response un:Dnrpjo Isolation - How often do you feel lonely or isolated from those around you?: Patient unable to respond  Services provided included: MD, RD, PT, OT, SLP, RN, CM, TR, Pharmacy, Neuropsych, SW  Financial Services:  Field seismologist Utilized: Private Insurance [employer]  Choices offered to/list presented to: [facility] for HHPT/OT/SLP/aide  Follow-up services arranged:  Patient/Family has no preference for HH/DME agencies, DME      DME : [facility] for 3in1 BSC and transport chair    Patient response to transportation need: Is the patient able to respond to transportation needs?: Yes In the past 12 months, has lack of transportation kept you from medical appointments or from getting medications?: No In the past 12 months, has lack of transportation kept you from meetings, work, or from getting things needed for daily living?: No   Patient/Family verbalized understanding of follow-up arrangements:  Yes  Individual responsible for coordination of the follow-up plan: contact pt dtr [name]  Confirmed correct DME delivered: Graeme DELENA Jude 08/01/2023    Comments (or additional information):fam edu completed  Summary of Stay    Date/Time Discharge Planning CSW  07/24/23 0933 Pt will likely return home with her youngest dtr bing primary caregiver, and support from ex-husband that lives in the home. PRN support  from various family members/children. SW will confirm there are no barriers to discharge. AAC  07/17/23 1500 TBA AAC       Kent Braunschweig A Jude

## 2023-08-02 ENCOUNTER — Telehealth: Admitting: Family

## 2023-08-02 ENCOUNTER — Telehealth: Payer: Self-pay | Admitting: Family

## 2023-08-02 ENCOUNTER — Encounter: Payer: Self-pay | Admitting: Family

## 2023-08-02 DIAGNOSIS — E785 Hyperlipidemia, unspecified: Secondary | ICD-10-CM

## 2023-08-02 DIAGNOSIS — R2681 Unsteadiness on feet: Secondary | ICD-10-CM

## 2023-08-02 DIAGNOSIS — E119 Type 2 diabetes mellitus without complications: Secondary | ICD-10-CM | POA: Diagnosis not present

## 2023-08-02 DIAGNOSIS — K219 Gastro-esophageal reflux disease without esophagitis: Secondary | ICD-10-CM | POA: Diagnosis not present

## 2023-08-02 DIAGNOSIS — K5900 Constipation, unspecified: Secondary | ICD-10-CM

## 2023-08-02 DIAGNOSIS — R531 Weakness: Secondary | ICD-10-CM | POA: Diagnosis not present

## 2023-08-02 DIAGNOSIS — Z0289 Encounter for other administrative examinations: Secondary | ICD-10-CM

## 2023-08-02 DIAGNOSIS — Z7984 Long term (current) use of oral hypoglycemic drugs: Secondary | ICD-10-CM

## 2023-08-02 DIAGNOSIS — F0390 Unspecified dementia without behavioral disturbance: Secondary | ICD-10-CM | POA: Diagnosis not present

## 2023-08-02 DIAGNOSIS — Z794 Long term (current) use of insulin: Secondary | ICD-10-CM

## 2023-08-02 DIAGNOSIS — I10 Essential (primary) hypertension: Secondary | ICD-10-CM

## 2023-08-02 DIAGNOSIS — Z8673 Personal history of transient ischemic attack (TIA), and cerebral infarction without residual deficits: Secondary | ICD-10-CM | POA: Diagnosis not present

## 2023-08-02 NOTE — Telephone Encounter (Signed)
 Patient's daughter requested for parking sticker form completed.Form completed and signed placed on outgoing mail box.Patient's daughter will come and pick up next week.please have POA complete applicant section on the top then make copy and give patient original copy to take to [facility].

## 2023-08-02 NOTE — Telephone Encounter (Signed)
 Patient's daughter, Shyann Hefner, is requesting a callback from the practice administrator as soon as possible. She expressed frustration over having to wait an extended period for [name] to log on to the appointment with her mother. She stated that this is the second consecutive day this has occurred. Edsel was very upset and mentioned she would be reporting the office. She also inquired about who the practice administrator is and why they were not in the office. Please advise. She is requesting a callback Monday morning.

## 2023-08-02 NOTE — Progress Notes (Signed)
 This service is provided via telemedicine  No vital signs collected/recorded due to the encounter was a telemedicine visit.   Location of patient (ex: home, work):  Home  Patient consents to a telephone visit: Yes  Location of the provider (ex: office, home):  [facility], Office   Name of any referring provider:  N/A  Names of all persons participating in the telemedicine service and their role in the encounter:  Shelli Ferrier, CMA, Patient, and Daughter Darice ,and Shamal Stracener, [name], NP   Time spent on call:  9 min with medical assistant      Provider: Crystelle Ferrufino FNP-C  Emari Demmer, [name], NP  Patient Care Team: Mykeal Carrick, [name], NP as PCP - General (Family Medicine) [name], MD as Consulting Physician (Neurology)  Extended Emergency Contact Information Primary Emergency Contact: Dellis,Karen  [place] Mobile Phone: [phone] Relation: Daughter Interpreter needed? No Secondary Emergency Contact: Gilliam,[name] Mobile Phone: [phone] Relation: Daughter  Code Status:  Full Code  Goals of care: Advanced Directive information    08/01/2023    3:04 PM  Advanced Directives  Does Patient Have a Medical Advance Directive? Yes  Type of Estate agent of [place];Living will  Does patient want to make changes to medical advance directive? No - Patient declined  Copy of Healthcare Power of Attorney in Chart? No - copy requested     Chief Complaint  Patient presents with   Forms    FMLA paperwork completed    Discussed the use of AI scribe software for clinical note transcription with the patient, who gave verbal consent to proceed.  History of Present Illness   TIWANA CHAVIS is a 73 year old female who presents for a hospital follow-up after discharge for an acute ischemic stroke.  She was recently discharged from the hospital following an acute ischemic stroke affecting the  right side of her brain, resulting in left-sided weakness. She is currently undergoing physical therapy. Occupational therapy has been delayed, and there is no home health nurse visiting. She is able to stand and pivot with assistance.  Her current medications include aspirin , atorvastatin  40 mg for cholesterol, Plavix  75 mg for stroke prevention, donepezil  5 mg for memory, glipizide 5 mg daily for blood sugar control, Novolog  70/30 with 6 units twice daily with meals for diabetes, Protonix  40 mg for acid reflux and to prevent bleeding from blood thinners, and Fibercon 625 mg daily. She also takes melatonin 3 mg for sleep, with the option to increase to 6 mg if needed.  Blood sugar levels have been monitored, with readings of 169 mg/dL postprandial and 849 mg/dL preprandial, which are within expected ranges. Her bowel movements are regular with no issues of constipation.   Past Medical History:  Diagnosis Date   Arthritis    Bipolar depression (HCC)    COPD (chronic obstructive pulmonary disease) (HCC)    Dementia (HCC)    Diabetes mellitus without complication (HCC)    Elevated LFTs    Emphysema of lung (HCC)    GERD (gastroesophageal reflux disease)    History of adenomatous polyps of colon 10/15/2013   Hyperlipemia    Hypertension    Stroke (HCC) 07/11/2023   UTI (lower urinary tract infection)    Past Surgical History:  Procedure Laterality Date   COLONOSCOPY     ESOPHAGOGASTRODUODENOSCOPY     TONSILLECTOMY     TONSILLECTOMY AND ADENOIDECTOMY     TUBAL LIGATION  No Known Allergies  Outpatient Encounter Medications as of 08/02/2023  Medication Sig   aspirin  81 MG chewable tablet Chew 1 tablet (81 mg total) by mouth daily.   atorvastatin  (LIPITOR) 40 MG tablet Take 1 tablet (40 mg total) by mouth daily.   clopidogrel  (PLAVIX ) 75 MG tablet Take 1 tablet (75 mg total) by mouth daily.   donepezil  (ARICEPT ) 5 MG tablet Take 1 tablet (5 mg total) by mouth at bedtime.    glyBURIDE  (DIABETA ) 5 MG tablet Take 1 tablet (5 mg total) by mouth daily with breakfast.   insulin  aspart protamine - aspart (NOVOLOG  70/30 MIX) (70-30) 100 UNIT/ML FlexPen Inject 6 Units into the skin 2 (two) times daily with a meal.   Insulin  Pen Needle 32G X 4 MM MISC Use 2 (two) times daily.   pantoprazole  (PROTONIX ) 40 MG tablet Take 1 tablet (40 mg total) by mouth daily with supper.   polycarbophil (FIBERCON) 625 MG tablet Take 1 tablet (625 mg total) by mouth daily.   senna-docusate (SENOKOT-S) 8.6-50 MG tablet Take 1 tablet by mouth at bedtime as needed for mild constipation.   acetaminophen  (TYLENOL ) 325 MG tablet Take 1-2 tablets (325-650 mg total) by mouth every 4 (four) hours as needed for mild pain (pain score 1-3). (Patient not taking: Reported on 08/02/2023)   No facility-administered encounter medications on file as of 08/02/2023.    Review of Systems  Unable to perform ROS: Acuity of condition (information provided by patient's daughter)  Constitutional:  Negative for appetite change, chills, fatigue, fever and unexpected weight change.  HENT:  Negative for congestion, ear discharge, ear pain, hearing loss, nosebleeds, postnasal drip, rhinorrhea, sinus pressure, sinus pain, sneezing, sore throat, tinnitus and trouble swallowing.   Eyes:  Negative for pain, discharge, redness, itching and visual disturbance.  Respiratory:  Negative for cough, chest tightness, shortness of breath and wheezing.   Cardiovascular:  Negative for chest pain, palpitations and leg swelling.  Gastrointestinal:  Positive for constipation. Negative for abdominal distention, abdominal pain, blood in stool, diarrhea, nausea and vomiting.  Endocrine: Negative for cold intolerance, heat intolerance, polydipsia, polyphagia and polyuria.  Genitourinary:  Negative for difficulty urinating, dysuria, flank pain, frequency and urgency.  Musculoskeletal:  Positive for gait problem. Negative for arthralgias, back pain,  joint swelling, myalgias, neck pain and neck stiffness.  Skin:  Negative for color change, pallor, rash and wound.  Neurological:  Positive for weakness. Negative for dizziness, syncope, speech difficulty, light-headedness, numbness and headaches.  Hematological:  Does not bruise/bleed easily.  Psychiatric/Behavioral:  Negative for agitation, behavioral problems, confusion, hallucinations, self-injury, sleep disturbance and suicidal ideas. The patient is not nervous/anxious.     Immunization History  Administered Date(s) Administered   Fluad Quad(high Dose 65+) 01/13/2019   Influenza,inj,Quad PF,6+ Mos 09/28/2016   Influenza-Unspecified 09/08/2013   PFIZER(Purple Top)SARS-COV-2 Vaccination 08/27/2019   PNEUMOCOCCAL CONJUGATE-20 05/16/2021   PPD Test 06/16/2013   Pneumococcal Conjugate-13 01/13/2019   Pertinent  Health Maintenance Due  Topic Date Due   OPHTHALMOLOGY EXAM  Never done   MAMMOGRAM  10/09/2015   Colonoscopy  10/15/2016   FOOT EXAM  07/17/2023   INFLUENZA VACCINE  08/09/2023   HEMOGLOBIN A1C  01/11/2024   DEXA SCAN  Completed      11/14/2021    2:32 PM 07/17/2022   12:52 PM 10/26/2022    1:37 PM 05/07/2023    2:07 PM 08/01/2023    3:04 PM  Fall Risk  Falls in the past year? 0 0 1  0 1  Was there an injury with Fall? 0 0 1 0 0  Fall Risk Category Calculator 0 0 2 0 2  Fall Risk Category (Retired) Low       (RETIRED) Patient Fall Risk Level Low fall risk       Patient at Risk for Falls Due to No Fall Risks No Fall Risks  No Fall Risks History of fall(s)  Fall risk Follow up Falls evaluation completed  Falls evaluation completed  Falls prevention discussed;Falls evaluation completed Falls evaluation completed     Data saved with a previous flowsheet row definition   Functional Status Survey:    There were no vitals filed for this visit. There is no height or weight on file to calculate BMI. Physical Exam Constitutional:      Appearance: She is not ill-appearing.   Pulmonary:     Effort: Pulmonary effort is normal. No respiratory distress.  Neurological:     Mental Status: She is alert and oriented to person, place, and time.     Gait: Gait abnormal.  Psychiatric:        Mood and Affect: Mood normal.        Behavior: Behavior normal.     Labs reviewed: Recent Labs    07/13/23 0339 07/14/23 0342 07/15/23 0500 07/16/23 0505 07/23/23 1845 07/29/23 0518 07/30/23 1917  NA 135 131* 133*   < > 135 137 136  K 3.9 3.7 4.4   < > 4.3 4.4 4.1  CL 101 99 99   < > 98 104 102  CO2 25 23 22    < > 25 26 26   GLUCOSE 200* 275* 217*   < > 161* 150* 253*  BUN 19 18 21    < > 16 15 21   CREATININE 0.66 0.67 0.63   < > 0.99 0.83 0.94  CALCIUM  9.2 8.8* 8.9   < > 9.9 9.7 9.1  MG 1.9 1.9 1.8  --   --   --   --   PHOS 3.4 3.2 3.7  --   --   --   --    < > = values in this interval not displayed.   Recent Labs    07/11/23 1458 07/17/23 0600 07/29/23 0518  AST 21 21 26   ALT 17 15 24   ALKPHOS 63 61 63  BILITOT 0.4 0.6 0.4  PROT 6.9 6.2* 5.6*  ALBUMIN 3.7 2.9* 3.0*   Recent Labs    07/11/23 1458 07/12/23 0512 07/17/23 0600 07/18/23 0607 07/21/23 0724 07/22/23 0515 07/25/23 0458 07/29/23 0518  WBC 10.1   < > 9.7   < > 8.1 9.0 8.2 8.2  NEUTROABS 6.7  --  5.1  --  4.7  --   --   --   HGB 12.3   < > 12.0   < > 12.8 11.9* 12.5 12.4  HCT 38.5   < > 38.2   < > 39.7 36.8 39.0 38.8  MCV 80.4   < > 81.3   < > 81.0 79.7* 81.3 80.8  PLT 226   < > 237   < > 266 290 314 308   < > = values in this interval not displayed.   Lab Results  Component Value Date   TSH 0.45 07/17/2022   Lab Results  Component Value Date   HGBA1C 12.3 (H) 07/11/2023   Lab Results  Component Value Date   CHOL 171 07/12/2023   HDL 49 07/12/2023   LDLCALC 110 (H)  07/12/2023   TRIG 58 07/12/2023   CHOLHDL 3.5 07/12/2023    Significant Diagnostic Results in last 30 days:  DG Abd 1 View Result Date: 07/14/2023 CLINICAL DATA:  Nasogastric tube placement. EXAM: ABDOMEN - 1  VIEW COMPARISON:  Radiographs 07/14/2023 and 07/13/2023. FINDINGS: 1211 hours. The enteric tube has been advanced, tip overlying the expected location of the mid stomach. There is contrast material within nondistended small bowel. Grossly stable superior endplate compression deformity at L2. IMPRESSION: Enteric tube tip overlies the expected location of the mid stomach. Electronically Signed   By: Elsie Perone M.D.   On: 07/14/2023 12:31   DG Swallowing Func-Speech Pathology Result Date: 07/14/2023 Table formatting from the original result was not included. Modified Barium Swallow Study Patient Details Name: JADALEE WESTCOTT MRN: [id number] Date of Birth: Jul 06, 1950 Today's Date: 07/14/2023 HPI/PMH: HPI: 73 y.o. female presented 7/3 with L sided weakness, L facial droop, and R gaze preference s/p TNK. CTA revealed R ICA occlusion. PMH includes: bipolar, dementia, DM, HTN, HLD, COPD. Clinical Impression: Pt presents with a primary oral dysphagia with left sided lingual and facial weakness with additional sensory and attention impairment. Pt has disorganized lingual collection of bolus with passive spillage to the pyriform sinus and modreate oral residuals in the left floor of mouth and buccal cavity. There is an added pharyngeal impairment, likely related to decreased sensation to trigger swallow. Bolus pools in pharynx for 5 to 30 seconds, in one instance leading to sensed aspiration before the swallow with thin liquids. Cough did not immediately eject aspirate, but pt had prolonged coughing that continued to mobilize aspirate to upper trachea. Further trials of straw sips of thin liquids were not aspirated. Pt benefits from cues to swallow twice: to clear oral residue and swallow.  Pt will be at risk of aspiraton of oral residue regardless of texture. Pt demonstrated improvement in timing of airway protection throughout study and would be more likely to mobilize aspirate of thin liquids. Advise initiating  pureed solids (pt edentulous with severe oral weakness) and thin liquids, with full supervision and assist needed for oral intake. Focus on oral hygiene, water  intake, pt awareness and second swallow. If pt coughs, encourage harder coughing for pulmonary hygiene. Factors that may increase risk of adverse event in presence of aspiration Noe & Lianne 2021): Factors that may increase risk of adverse event in presence of aspiration Noe & Lianne 2021): Dependence for feeding and/or oral hygiene; Reduced cognitive function Recommendations/Plan: Swallowing Evaluation Recommendations Swallowing Evaluation Recommendations Recommendations: PO diet PO Diet Recommendation: Dysphagia 1 (Pureed); Thin liquids (Level 0) Liquid Administration via: Straw Medication Administration: Crushed with puree Supervision: Full assist for feeding; Full supervision/cueing for swallowing strategies Swallowing strategies  : Slow rate; Small bites/sips; Check for pocketing or oral holding; Check for anterior loss; Multiple dry swallows after each bite/sip; Hard cough after swallowing Postural changes: Stay upright 30-60 min after meals; Position pt fully upright for meals Oral care recommendations: Oral care before PO; Oral care QID (4x/day) Caregiver Recommendations: Have oral suction available Treatment Plan Treatment Plan Treatment recommendations: Therapy as outlined in treatment plan below Follow-up recommendations: Acute inpatient rehab (3 hours/day) Treatment frequency: Min 2x/week Treatment duration: 2 weeks Recommendations Recommendations for follow up therapy are one component of a multi-disciplinary discharge planning process, led by the attending physician.  Recommendations may be updated based on patient status, additional functional criteria and insurance authorization. Assessment: Orofacial Exam: Orofacial Exam Oral Cavity: Oral Hygiene: WFL Oral Cavity - Dentition: Edentulous  Oral Motor/Sensory Function: Suspected cranial  nerve impairment CN V - Trigeminal: Left sensory impairment CN VII - Facial: Left motor impairment CN IX - Glossopharyngeal, CN X - Vagus: Left motor impairment CN XII - Hypoglossal: Left motor impairment Anatomy: Anatomy: WFL Boluses Administered: Boluses Administered Boluses Administered: Thin liquids (Level 0); Mildly thick liquids (Level 2, nectar thick); Moderately thick liquids (Level 3, honey thick); Puree; Solid  Oral Impairment Domain: Oral Impairment Domain Lip Closure: Escape beyond mid-chin Tongue control during bolus hold: Not tested Bolus preparation/mastication: -- (NT) Bolus transport/lingual motion: Repetitive/disorganized tongue motion; Slow tongue motion Oral residue: Residue collection on oral structures Location of oral residue : Floor of mouth; Lateral sulci Initiation of pharyngeal swallow : Pyriform sinuses  Pharyngeal Impairment Domain: Pharyngeal Impairment Domain Soft palate elevation: No bolus between soft palate (SP)/pharyngeal wall (PW) Laryngeal elevation: Complete superior movement of thyroid  cartilage with complete approximation of arytenoids to epiglottic petiole Anterior hyoid excursion: Complete anterior movement Epiglottic movement: No inversion Laryngeal vestibule closure: Complete, no air/contrast in laryngeal vestibule Pharyngeal stripping wave : Present - complete Pharyngoesophageal segment opening: Complete distension and complete duration, no obstruction of flow Tongue base retraction: Trace column of contrast or air between tongue base and PPW Pharyngeal residue: Trace residue within or on pharyngeal structures Location of pharyngeal residue: Valleculae  Esophageal Impairment Domain: Esophageal Impairment Domain Esophageal clearance upright position: -- (NT) Pill: No data recorded Penetration/Aspiration Scale Score: Penetration/Aspiration Scale Score 1.  Material does not enter airway: Mildly thick liquids (Level 2, nectar thick); Moderately thick liquids (Level 3, honey  thick); Puree; Thin liquids (Level 0) 2.  Material enters airway, remains ABOVE vocal cords then ejected out: Thin liquids (Level 0); Mildly thick liquids (Level 2, nectar thick) 7.  Material enters airway, passes BELOW cords and not ejected out despite cough attempt by patient: Thin liquids (Level 0) Compensatory Strategies: No data recorded  General Information: Caregiver present: No  Diet Prior to this Study: NPO   Temperature : Normal   Respiratory Status: WFL   Supplemental O2: None (Room air)   History of Recent Intubation: No  Behavior/Cognition: Alert; Cooperative; Requires cueing Self-Feeding Abilities: Needs assist with self-feeding Baseline vocal quality/speech: Not observed No data recorded Volitional Swallow: Able to elicit Exam Limitations: Excessive movement Goal Planning: Prognosis for improved oropharyngeal function: Good Barriers to Reach Goals: Cognitive deficits No data recorded No data recorded Consulted and agree with results and recommendations: Pt unable/family or caregiver not available Pain: Pain Assessment Pain Assessment: No/denies pain Faces Pain Scale: 0 End of Session: Start Time:SLP Start Time (ACUTE ONLY): 1030 Stop Time: SLP Stop Time (ACUTE ONLY): 1050 Time Calculation:SLP Time Calculation (min) (ACUTE ONLY): 20 min Charges: SLP Evaluations $ SLP Speech Visit: 1 Visit SLP Evaluations $MBS Swallow: 1 Procedure $Swallowing Treatment: 1 Procedure SLP visit diagnosis: SLP Visit Diagnosis: Dysphagia, oropharyngeal phase (R13.12) Past Medical History: Past Medical History: Diagnosis Date  Arthritis   Bipolar depression (HCC)   COPD (chronic obstructive pulmonary disease) (HCC)   Dementia (HCC)   Diabetes mellitus without complication (HCC)   Elevated LFTs   Emphysema of lung (HCC)   GERD (gastroesophageal reflux disease)   History of adenomatous polyps of colon 10/15/2013  Hyperlipemia   Hypertension   UTI (lower urinary tract infection)  Past Surgical History: Past Surgical History:  Procedure Laterality Date  COLONOSCOPY    ESOPHAGOGASTRODUODENOSCOPY    TONSILLECTOMY    TONSILLECTOMY AND ADENOIDECTOMY    TUBAL LIGATION   DeBlois, Consuelo Fitch 07/14/2023, 11:13  AM  DG Abd 1 View Result Date: 07/14/2023 CLINICAL DATA:  NG tube placement EXAM: ABDOMEN - 1 VIEW COMPARISON:  07/13/2023 FINDINGS: Limited field of view for tube placement verification purposes. An enteric tube is present with tip projecting over the left upper quadrant consistent with location in the upper stomach. This has been advanced since the prior study. Scattered gas and stool throughout the colon. No small or large bowel distention. Visualized lung bases are clear. Degenerative changes in the spine. IMPRESSION: Enteric tube tip projects over the left upper quadrant consistent with location in the upper stomach. Electronically Signed   By: Elsie Gravely M.D.   On: 07/14/2023 01:55   DG Abd 1 View Result Date: 07/13/2023 CLINICAL DATA:  Initial evaluation for NG tube placement EXAM: ABDOMEN - 1 VIEW COMPARISON:  Prior radiograph from 07/12/2023 FINDINGS: Enteric tube in place and is seen coiled back on itself above the diaphragm, likely in the distal esophagus. Tip projects cephalad at. Repositioning recommended. Visualized bowel gas pattern is nonobstructive. No soft tissue mass or abnormal calcification Visualized lungs are clear. IMPRESSION: Enteric tube coiled back on itself above the diaphragm, likely in the distal esophagus. Repositioning recommended. Electronically Signed   By: Morene Hoard M.D.   On: 07/13/2023 23:24   DG Abd Portable 1V Result Date: 07/12/2023 CLINICAL DATA:  NG tube placement. EXAM: PORTABLE ABDOMEN - 1 VIEW COMPARISON:  08/04/2013. FINDINGS: The bowel gas pattern is normal. An enteric tube terminates in the stomach and appears appropriate position. No radio-opaque calculi or other acute radiographic abnormality are seen. IMPRESSION: 1. Nonobstructive bowel gas pattern. 2. Enteric tube  terminates in the stomach. Electronically Signed   By: Leita Birmingham M.D.   On: 07/12/2023 15:28   MR BRAIN WO CONTRAST Result Date: 07/12/2023 CLINICAL DATA:  Stroke follow-up, right ICA occlusion. EXAM: MRI HEAD WITHOUT CONTRAST TECHNIQUE: Multiplanar, multiecho pulse sequences of the brain and surrounding structures were obtained without intravenous contrast. COMPARISON:  CT head and CTA head and neck 07/11/2023. FINDINGS: Brain: Diffuse areas of restricted diffusion in the right frontoparietal lobes within the right MCA and right ACA territories. Associated edema and sulcal effacement. Few areas of susceptibility in the right frontoparietal lobes which may reflect developing petechial hemorrhage. There are additional areas of restricted diffusion along the right insular cortex and right temporal lobe. Few areas of restricted diffusion involving the cortex and subcortical white matter in the lateral right occipital lobe. Five additional punctate areas of restricted diffusion in the left basal ganglia primarily in the caudate head and lentiform nucleus. Small focus of restricted diffusion in the parasagittal posterior left frontal lobe at the vertex within the ACA territory. No midline shift. T2/FLAIR hyperintensity in the periventricular and subcortical white matter. Mild parenchymal volume loss. The basilar cisterns are patent. No extra-axial fluid collections. Ventricles: Normal size and configuration of the ventricles. Vascular: Skull base flow voids are visualized. Right ICA flow void is visualized at the skull base. Skull and upper cervical spine: No focal abnormality. Sinuses/Orbits: Orbits are symmetric. Paranasal sinuses are clear. Other: Mastoid air cells are clear. IMPRESSION: Scattered areas of acute infarct throughout the right MCA and right ACA territories. Additional punctate areas of acute infarct in the left basal ganglia and within the posterior aspect of the left ACA territory. Mild edema and  sulcal effacement in the right frontoparietal lobes. No midline shift. Mild chronic microvascular ischemic changes. Right ICA flow void is visualized at the skull base which may reflect interval improvement  in flow within the vessel compared to the prior CTA. Electronically Signed   By: Donnice Mania M.D.   On: 07/12/2023 15:22   ECHOCARDIOGRAM COMPLETE Result Date: 07/12/2023    ECHOCARDIOGRAM REPORT   Patient Name:   Estephanie A Dowen Date of Exam: 07/12/2023 Medical Rec #:  [id number]          Height:       63.0 in Accession #:    [id number]         Weight:       122.1 lb Date of Birth:  October 18, 1950         BSA:          1.568 m Patient Age:    72 years           BP:           137/78 mmHg Patient Gender: F                  HR:           83 bpm. Exam Location:  Inpatient Procedure: 2D Echo, Cardiac Doppler and Color Doppler (Both Spectral and Color            Flow Doppler were utilized during procedure). Indications:    Stroke  History:        Patient has no prior history of Echocardiogram examinations.                 COPD; Risk Factors:Hypertension, Dyslipidemia and Diabetes.  Sonographer:    Philomena Daring Referring Phys: Anius.Banister MCNEILL P KIRKPATRICK IMPRESSIONS  1. Left ventricular ejection fraction, by estimation, is >75%. The left ventricle has hyperdynamic function. The left ventricle has no regional wall motion abnormalities. Left ventricular diastolic parameters were normal.  2. Right ventricular systolic function is normal. The right ventricular size is normal. Moderately increased right ventricular wall thickness. There is normal pulmonary artery systolic pressure. The estimated right ventricular systolic pressure is 28.0 mmHg.  3. Findings are consistent with cardiac tamponade.  4. The mitral valve is degenerative. No evidence of mitral valve regurgitation. No evidence of mitral stenosis.  5. The aortic valve is normal in structure. Aortic valve regurgitation is not visualized. No aortic stenosis is  present.  6. The inferior vena cava is normal in size with greater than 50% respiratory variability, suggesting right atrial pressure of 3 mmHg.  7. Subcostal images were suboptimal but Agitated saline contrast bubble study was negative, with no evidence of any interatrial shunt. Clinical correlation required. Comparison(s): No prior Echocardiogram. Conclusion(s)/Recommendation(s): No intracardiac source of embolism detected on this transthoracic study. Consider a transesophageal echocardiogram to exclude cardiac source of embolism if clinically indicated. FINDINGS  Left Ventricle: Left ventricular ejection fraction, by estimation, is >75%. The left ventricle has hyperdynamic function. The left ventricle has no regional wall motion abnormalities. The left ventricular internal cavity size was small. There is no left  ventricular hypertrophy. Left ventricular diastolic parameters were normal. Right Ventricle: The right ventricular size is normal. Moderately increased right ventricular wall thickness. Right ventricular systolic function is normal. There is normal pulmonary artery systolic pressure. The tricuspid regurgitant velocity is 2.50 m/s, and with an assumed right atrial pressure of 3 mmHg, the estimated right ventricular systolic pressure is 28.0 mmHg. Left Atrium: Left atrial size was normal in size. Right Atrium: Right atrial size was normal in size. Pericardium: Trivial pericardial effusion is present. There is evidence of cardiac tamponade. Presence of epicardial fat layer. Mitral  Valve: The mitral valve is degenerative in appearance. There is mild thickening of the mitral valve leaflet(s). Normal mobility of the mitral valve leaflets. Mild mitral annular calcification. No evidence of mitral valve regurgitation. No evidence  of mitral valve stenosis. Tricuspid Valve: The tricuspid valve is normal in structure. Tricuspid valve regurgitation is mild . No evidence of tricuspid stenosis. Aortic Valve: The  aortic valve is normal in structure. Aortic valve regurgitation is not visualized. No aortic stenosis is present. Pulmonic Valve: The pulmonic valve was not well visualized. Pulmonic valve regurgitation is not visualized. Aorta: The aortic root was not well visualized. Venous: The inferior vena cava is normal in size with greater than 50% respiratory variability, suggesting right atrial pressure of 3 mmHg. IAS/Shunts: The interatrial septum was not well visualized. Agitated saline contrast was given intravenously to evaluate for intracardiac shunting. Subcostal images were suboptimal but Agitated saline contrast bubble study was negative, with no evidence of any interatrial shunt. Clinical correlation required.  LEFT VENTRICLE PLAX 2D LVIDd:         3.60 cm   Diastology LVIDs:         2.40 cm   LV e' medial:    7.18 cm/s LV PW:         0.90 cm   LV E/e' medial:  11.2 LV IVS:        0.90 cm   LV e' lateral:   11.40 cm/s LVOT diam:     2.00 cm   LV E/e' lateral: 7.1 LV SV:         63 LV SV Index:   40 LVOT Area:     3.14 cm  RIGHT VENTRICLE             IVC RV S prime:     14.50 cm/s  IVC diam: 1.40 cm TAPSE (M-mode): 1.7 cm LEFT ATRIUM             Index        RIGHT ATRIUM          Index LA diam:        2.50 cm 1.59 cm/m   RA Area:     9.17 cm LA Vol (A2C):   16.2 ml 10.33 ml/m  RA Volume:   17.30 ml 11.03 ml/m LA Vol (A4C):   31.8 ml 20.28 ml/m LA Biplane Vol: 22.7 ml 14.48 ml/m  AORTIC VALVE LVOT Vmax:   106.00 cm/s LVOT Vmean:  70.200 cm/s LVOT VTI:    0.199 m MITRAL VALVE               TRICUSPID VALVE MV Area (PHT): 3.77 cm    TR Peak grad:   25.0 mmHg MV Decel Time: 201 msec    TR Vmax:        250.00 cm/s MV E velocity: 80.70 cm/s MV A velocity: 90.70 cm/s  SHUNTS MV E/A ratio:  0.89        Systemic VTI:  0.20 m                            Systemic Diam: 2.00 cm Sunit Tolia Electronically signed by Madonna Large Signature Date/Time: 07/12/2023/11:15:38 AM    Final    CT ANGIO HEAD NECK W WO CM (CODE  STROKE) Result Date: 07/11/2023 CLINICAL DATA:  Code stroke, neuro deficit. Left-sided gaze and left-sided weakness. EXAM: CT ANGIOGRAPHY HEAD AND NECK WITH AND WITHOUT CONTRAST TECHNIQUE: Multidetector CT  imaging of the head and neck was performed using the standard protocol during bolus administration of intravenous contrast. Multiplanar CT image reconstructions and MIPs were obtained to evaluate the vascular anatomy. Carotid stenosis measurements (when applicable) are obtained utilizing NASCET criteria, using the distal internal carotid diameter as the denominator. RADIATION DOSE REDUCTION: This exam was performed according to the departmental dose-optimization program which includes automated exposure control, adjustment of the mA and/or kV according to patient size and/or use of iterative reconstruction technique. CONTRAST:  75mL OMNIPAQUE  IOHEXOL  350 MG/ML SOLN COMPARISON:  Same-day head CT.  MRI head 07/03/2017. FINDINGS: CTA NECK FINDINGS Aortic arch: Standard configuration of the aortic arch. Imaged portion shows no evidence of aneurysm or dissection. Mild-to-moderate atherosclerosis of the visualized aortic arch. No significant stenosis of the major arch vessel origins. Pulmonary arteries: As permitted by contrast timing, there are no filling defects in the visualized pulmonary arteries. Subclavian arteries: Patent bilaterally. Atherosclerosis at the origin of the left subclavian artery. There is additional noncalcified atherosclerosis along the proximal aspect of the vessel resulting in moderate to severe stenosis. Right carotid system: The common carotid artery is patent. External carotid artery branches are patent. There is abrupt cut off of the proximal cervical ICA approximately 5 mm distal to the bifurcation. The vessel remains occluded to the intracranial segment. Left carotid system: Patent from the origin to the skull base. Carotid bifurcation is patent. External carotid artery branches are  patent. No high-grade stenosis of the internal carotid artery. Vertebral arteries: Right vertebral artery is dominant and is patent from the origin to the vertebrobasilar confluence. Atherosclerosis at the right vertebral artery origin without significant stenosis. Atherosclerosis at the origin of the non dominant left vertebral artery resulting in severe stenosis. There is multifocal irregularity and stenosis of the left V2 segment. Diminished caliber of the intracranial left vertebral artery after the origin of the left PICA. There is reconstitution of the distal left V4 segment just proximal to the vertebrobasilar confluence likely filled via retrograde flow. Skeleton: No acute findings. Degenerative changes in the cervical spine. Disc space narrowing most pronounced at C6-7. Edentulous maxilla and mandible. Other neck: The visualized airway is patent. No cervical lymphadenopathy. Upper chest: Visualized lung apices are clear. Review of the MIP images confirms the above findings CTA HEAD FINDINGS ANTERIOR CIRCULATION: The left internal carotid artery is patent from the cervical segment to the ICA terminus. Atherosclerosis along the carotid siphon resulting in mild-to-moderate stenosis of the anterior genu of the cavernous segment and paraclinoid segment. The right ICA is occluded from the cervical segment to the distal cavernous/paraclinoid segment. Additional atherosclerosis noted within the right carotid siphon. MCAs: The right M1 segment is patent. The MCA bifurcation is patent on the right. There is diminished caliber of right MCA branches compared to the left. Overall diminished number of second and third order branches of the right MCA compared to the left ACAs: Patent bilaterally. 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 Similar to the right MCA there is decreased caliber of distal right ACA branches compared to the left ACA. POSTERIOR CIRCULATION: No significant  stenosis, proximal occlusion, aneurysm, or vascular malformation. PCAs: Patent bilaterally. Moderate stenosis of the P2 a segment of the right PCA. Additional mild stenosis of the P2 a segment left PCA. Pcomm: The posterior communicating arteries are visualized bilaterally. SCAs: Patent bilaterally. Moderate stenosis at the origin of the left SCA. Basilar artery: Patent AICAs: Patent PICAs: Patent Vertebral arteries: As above. Venous  sinuses: As permitted by contrast timing, patent. 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 Reconstitution at the paraclinoid segment likely via the circle-of-Willis. There is decreased caliber of right MCA branches with fewer second and third order branches of the right MCA compared to the left. Similarly there are decreased caliber and decreased number of right ACA branches compared to the left. There is no focal occlusion identified within the right MCA or right ACA. Suspect findings are related to right ICA occlusion. 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 Multifocal stenosis and irregularity of the left V2 segment. 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 Moderate stenosis of the P2 segment right PCA. Moderate stenosis at the origin of the left SCA. Prominent noncalcified atherosclerotic plaque in the proximal left subclavian artery resulting in moderate to severe stenosis. Aortic Atherosclerosis (ICD10-I70.0). Findings of right ICA occlusion and diminished caliber/branching of the right MCA were communicated to Dr. Michaela at 3:31 pm on 07/11/2023 by text page via the [facility] messaging system. Electronically Signed   By: Donnice Mania M.D.   On: 07/11/2023  15:54   CT HEAD CODE STROKE WO CONTRAST Result Date: 07/11/2023 CLINICAL DATA:  Code stroke.  Neuro deficit, concern for stroke. EXAM: CT HEAD WITHOUT CONTRAST TECHNIQUE: Contiguous axial images were obtained from the base of the skull through the vertex without intravenous contrast. RADIATION DOSE REDUCTION: This exam was performed according to the departmental dose-optimization program which includes automated exposure control, adjustment of the mA and/or kV according to patient size and/or use of iterative reconstruction technique. COMPARISON:  MRI head 07/03/2017. FINDINGS: Brain: No acute intracranial hemorrhage. No CT evidence of acute infarct. No edema, mass effect, or midline shift. The basilar cisterns are patent. Ventricles: The ventricles are normal. Vascular: Atherosclerotic calcifications of the carotid siphons. No hyperdense vessel. Skull: No acute or aggressive finding. Orbits: Orbits are symmetric. Sinuses: The visualized paranasal sinuses are clear. Other: Mastoid air cells are clear. ASPECTS [facility] Stroke Program Early CT Score) - Ganglionic level infarction (caudate, lentiform nuclei, internal capsule, insula, M1-M3 cortex): 7 - Supraganglionic infarction (M4-M6 cortex): 3 Total score (0-10 with 10 being normal): 10 IMPRESSION: 1. No CT evidence of acute intracranial abnormality. 2. ASPECTS is 10 These results were communicated to Dr. Michaela at 3:12 pm on 07/11/2023 by text page via the [facility] messaging system. Electronically Signed   By: Donnice Mania M.D.   On: 07/11/2023 15:12    Assessment/Plan  Acute Ischemic Stroke Recently discharged following an acute ischemic stroke affecting the right hemisphere, resulting in left-sided weakness. Currently undergoing physical therapy to improve strength and mobility. - Continue physical therapy to improve strength and mobility - Encourage ambulation and exercises independently  Diabetes Mellitus On glipizide and Novolog  for diabetes  management. Home blood glucose monitoring shows postprandial levels at 169 mg/dL and preprandial levels at 150 mg/dL, within acceptable ranges. - Continue glipizide 5 mg daily - Administer Novolog  70/30, 6 units twice daily with meals - Monitor blood glucose levels at home daily  Hyperlipidemia On atorvastatin  40 mg for cholesterol management. - Continue atorvastatin  40 mg daily  Memory Impairment On donepezil  5 mg for memory issues. - Continue donepezil  5 mg daily  Gastroesophageal Reflux  Disease (GERD) On Protonix  40 mg for acid reflux and to prevent bleeding due to anticoagulation therapy. - Continue Protonix  40 mg daily  Constipation Using Senokot and Fibercon for bowel regulation. - Continue Senokot as needed - Continue Fibercon 625 mg daily  General Health Maintenance Discussion on melatonin for sleep, recommending 3 mg with potential increase to 6 mg. Encouraged nutritional intake to regain strength post-hospitalization. - Take melatonin 3 mg, may increase to 6 mg if needed for sleep - Encourage adequate nutrition  Follow-up Arrangements made for FMLA paperwork and DMV form completion, with pickup scheduled next week. - Complete FMLA paperwork and DMV form - Arrange for form pickup next week    Family/ staff Communication: Reviewed plan of care with patient verbalized understanding   Labs/tests ordered: None   Next Appointment: Return if symptoms worsen or fail to improve.  Spent 20 minutes of face to face with patient  >50% time spent counseling; reviewing medical record; tests; labs; and developing future plan of care.  I connected with  Comer DELENA Pinal on 08/09/23 by a video enabled telemedicine application and verified that I am speaking with the correct person using two identifiers.   I discussed the limitations of evaluation and management by telemedicine. The patient expressed understanding and agreed to proceed.    Roxan JAYSON Plough, NP

## 2023-08-04 DIAGNOSIS — F3164 Bipolar disorder, current episode mixed, severe, with psychotic features: Secondary | ICD-10-CM | POA: Diagnosis not present

## 2023-08-04 DIAGNOSIS — F99 Mental disorder, not otherwise specified: Secondary | ICD-10-CM | POA: Diagnosis not present

## 2023-08-04 DIAGNOSIS — I69392 Facial weakness following cerebral infarction: Secondary | ICD-10-CM | POA: Diagnosis not present

## 2023-08-04 DIAGNOSIS — E119 Type 2 diabetes mellitus without complications: Secondary | ICD-10-CM | POA: Diagnosis not present

## 2023-08-04 DIAGNOSIS — I69391 Dysphagia following cerebral infarction: Secondary | ICD-10-CM | POA: Diagnosis not present

## 2023-08-04 DIAGNOSIS — F03A3 Unspecified dementia, mild, with mood disturbance: Secondary | ICD-10-CM | POA: Diagnosis not present

## 2023-08-04 DIAGNOSIS — I10 Essential (primary) hypertension: Secondary | ICD-10-CM | POA: Diagnosis not present

## 2023-08-04 DIAGNOSIS — I69354 Hemiplegia and hemiparesis following cerebral infarction affecting left non-dominant side: Secondary | ICD-10-CM | POA: Diagnosis not present

## 2023-08-04 DIAGNOSIS — J449 Chronic obstructive pulmonary disease, unspecified: Secondary | ICD-10-CM | POA: Diagnosis not present

## 2023-08-06 ENCOUNTER — Encounter: Payer: Self-pay | Admitting: Family

## 2023-08-06 NOTE — Telephone Encounter (Signed)
 Practice administrator was notified about this event and will follow-up with patients daughter

## 2023-08-06 NOTE — Progress Notes (Signed)
 Patient was identified as falling into the [place] Measure - Diabetes.   Patient was: Attribution and/or data issue.  Validation/Investigation needed.  Explanation:  patient has been discharged from [facility] since 2019

## 2023-08-09 DIAGNOSIS — I69354 Hemiplegia and hemiparesis following cerebral infarction affecting left non-dominant side: Secondary | ICD-10-CM | POA: Diagnosis not present

## 2023-08-09 DIAGNOSIS — F3164 Bipolar disorder, current episode mixed, severe, with psychotic features: Secondary | ICD-10-CM | POA: Diagnosis not present

## 2023-08-09 DIAGNOSIS — I69392 Facial weakness following cerebral infarction: Secondary | ICD-10-CM | POA: Diagnosis not present

## 2023-08-09 DIAGNOSIS — E119 Type 2 diabetes mellitus without complications: Secondary | ICD-10-CM | POA: Diagnosis not present

## 2023-08-09 DIAGNOSIS — I69391 Dysphagia following cerebral infarction: Secondary | ICD-10-CM | POA: Diagnosis not present

## 2023-08-09 DIAGNOSIS — F99 Mental disorder, not otherwise specified: Secondary | ICD-10-CM | POA: Diagnosis not present

## 2023-08-09 DIAGNOSIS — I10 Essential (primary) hypertension: Secondary | ICD-10-CM | POA: Diagnosis not present

## 2023-08-09 DIAGNOSIS — J449 Chronic obstructive pulmonary disease, unspecified: Secondary | ICD-10-CM | POA: Diagnosis not present

## 2023-08-09 DIAGNOSIS — F03A3 Unspecified dementia, mild, with mood disturbance: Secondary | ICD-10-CM | POA: Diagnosis not present

## 2023-08-09 NOTE — Progress Notes (Signed)
 This encounter was created in error - please disregard.

## 2023-08-12 ENCOUNTER — Encounter: Payer: Self-pay | Admitting: Nurse Practitioner

## 2023-08-12 ENCOUNTER — Telehealth: Payer: Self-pay | Admitting: *Deleted

## 2023-08-12 DIAGNOSIS — E119 Type 2 diabetes mellitus without complications: Secondary | ICD-10-CM | POA: Diagnosis not present

## 2023-08-12 DIAGNOSIS — J449 Chronic obstructive pulmonary disease, unspecified: Secondary | ICD-10-CM | POA: Diagnosis not present

## 2023-08-12 DIAGNOSIS — I69391 Dysphagia following cerebral infarction: Secondary | ICD-10-CM | POA: Diagnosis not present

## 2023-08-12 DIAGNOSIS — F99 Mental disorder, not otherwise specified: Secondary | ICD-10-CM | POA: Diagnosis not present

## 2023-08-12 DIAGNOSIS — I69392 Facial weakness following cerebral infarction: Secondary | ICD-10-CM | POA: Diagnosis not present

## 2023-08-12 DIAGNOSIS — I10 Essential (primary) hypertension: Secondary | ICD-10-CM | POA: Diagnosis not present

## 2023-08-12 DIAGNOSIS — F03A3 Unspecified dementia, mild, with mood disturbance: Secondary | ICD-10-CM | POA: Diagnosis not present

## 2023-08-12 DIAGNOSIS — F3164 Bipolar disorder, current episode mixed, severe, with psychotic features: Secondary | ICD-10-CM | POA: Diagnosis not present

## 2023-08-12 DIAGNOSIS — I69354 Hemiplegia and hemiparesis following cerebral infarction affecting left non-dominant side: Secondary | ICD-10-CM | POA: Diagnosis not present

## 2023-08-12 NOTE — Telephone Encounter (Signed)
 Stephanie with [facility]  Notified and agreed.

## 2023-08-12 NOTE — Telephone Encounter (Signed)
 Copied from CRM #[id number]. Topic: Clinical - Home Health Verbal Orders >> Aug 12, 2023  2:30 PM Alfonso ORN wrote: Caller/Agency:   [facility] occupational therapist Corean Rushing Number: [phone] , secure voice message can leave a message  Service Requested: Occupational Therapy Frequency: 1 x4 and 1time every other week for 2 visit  Any new concerns about the patient? No

## 2023-08-13 NOTE — Telephone Encounter (Signed)
 Can you order CGM system for her?  Thank you

## 2023-08-14 ENCOUNTER — Telehealth: Payer: Self-pay

## 2023-08-14 DIAGNOSIS — Z7984 Long term (current) use of oral hypoglycemic drugs: Secondary | ICD-10-CM

## 2023-08-14 DIAGNOSIS — Z87440 Personal history of urinary (tract) infections: Secondary | ICD-10-CM

## 2023-08-14 DIAGNOSIS — Z556 Problems related to health literacy: Secondary | ICD-10-CM

## 2023-08-14 DIAGNOSIS — E871 Hypo-osmolality and hyponatremia: Secondary | ICD-10-CM

## 2023-08-14 DIAGNOSIS — F99 Mental disorder, not otherwise specified: Secondary | ICD-10-CM | POA: Diagnosis not present

## 2023-08-14 DIAGNOSIS — I69392 Facial weakness following cerebral infarction: Secondary | ICD-10-CM | POA: Diagnosis not present

## 2023-08-14 DIAGNOSIS — I69391 Dysphagia following cerebral infarction: Secondary | ICD-10-CM | POA: Diagnosis not present

## 2023-08-14 DIAGNOSIS — J449 Chronic obstructive pulmonary disease, unspecified: Secondary | ICD-10-CM | POA: Diagnosis not present

## 2023-08-14 DIAGNOSIS — F3164 Bipolar disorder, current episode mixed, severe, with psychotic features: Secondary | ICD-10-CM | POA: Diagnosis not present

## 2023-08-14 DIAGNOSIS — F17210 Nicotine dependence, cigarettes, uncomplicated: Secondary | ICD-10-CM

## 2023-08-14 DIAGNOSIS — Z794 Long term (current) use of insulin: Secondary | ICD-10-CM

## 2023-08-14 DIAGNOSIS — E785 Hyperlipidemia, unspecified: Secondary | ICD-10-CM

## 2023-08-14 DIAGNOSIS — Z6820 Body mass index (BMI) 20.0-20.9, adult: Secondary | ICD-10-CM

## 2023-08-14 DIAGNOSIS — K219 Gastro-esophageal reflux disease without esophagitis: Secondary | ICD-10-CM

## 2023-08-14 DIAGNOSIS — Z7982 Long term (current) use of aspirin: Secondary | ICD-10-CM

## 2023-08-14 DIAGNOSIS — E440 Moderate protein-calorie malnutrition: Secondary | ICD-10-CM

## 2023-08-14 DIAGNOSIS — I10 Essential (primary) hypertension: Secondary | ICD-10-CM | POA: Diagnosis not present

## 2023-08-14 DIAGNOSIS — Z7902 Long term (current) use of antithrombotics/antiplatelets: Secondary | ICD-10-CM

## 2023-08-14 DIAGNOSIS — G8929 Other chronic pain: Secondary | ICD-10-CM

## 2023-08-14 DIAGNOSIS — Z860101 Personal history of adenomatous and serrated colon polyps: Secondary | ICD-10-CM

## 2023-08-14 DIAGNOSIS — I959 Hypotension, unspecified: Secondary | ICD-10-CM

## 2023-08-14 DIAGNOSIS — M810 Age-related osteoporosis without current pathological fracture: Secondary | ICD-10-CM

## 2023-08-14 DIAGNOSIS — I69354 Hemiplegia and hemiparesis following cerebral infarction affecting left non-dominant side: Secondary | ICD-10-CM | POA: Diagnosis not present

## 2023-08-14 DIAGNOSIS — F03A3 Unspecified dementia, mild, with mood disturbance: Secondary | ICD-10-CM | POA: Diagnosis not present

## 2023-08-14 DIAGNOSIS — E119 Type 2 diabetes mellitus without complications: Secondary | ICD-10-CM | POA: Diagnosis not present

## 2023-08-14 MED ORDER — ONETOUCH ULTRA 2 W/DEVICE KIT: PACK | 0 refills | Status: AC

## 2023-08-14 MED ORDER — ONETOUCH ULTRA BLUE TEST VI STRP: ORAL_STRIP | 1 refills | Status: AC

## 2023-08-14 NOTE — Telephone Encounter (Signed)
 PA was sent to plan for glucose blood (ONETOUCH ULTRA BLUE TEST) test strip [[id number]] .  KEY: ( [id number] )

## 2023-08-14 NOTE — Telephone Encounter (Signed)
 CGM sent to pharmacy as requested.

## 2023-08-16 ENCOUNTER — Other Ambulatory Visit (HOSPITAL_COMMUNITY): Payer: Self-pay

## 2023-08-16 ENCOUNTER — Other Ambulatory Visit: Payer: Self-pay | Admitting: Nurse Practitioner

## 2023-08-16 ENCOUNTER — Telehealth: Payer: Self-pay

## 2023-08-16 DIAGNOSIS — Z794 Long term (current) use of insulin: Secondary | ICD-10-CM

## 2023-08-16 NOTE — Telephone Encounter (Signed)
   Pre-operative Risk Assessment    Patient Name: Kathryn Mcguire  DOB: December 13, 1950 MRN: [id number]   Date of last office visit: NONE Date of next office visit: NONE   Request for Surgical Clearance    Procedure:  ACDF C4-C5, C5-C6, C6-C7  Date of Surgery:  Clearance 09/04/23                                Surgeon:  ALM MOLT, MD Surgeon's Group or Practice Name:  [facility] Phone number:  [phone] Fax number:  [phone]   Type of Clearance Requested:   - Medical  - Pharmacy:  Hold Aspirin  and Clopidogrel  (Plavix )     Type of Anesthesia:  General    Additional requests/questions:    SignedLucie DELENA Ku   08/16/2023, 4:41 PM

## 2023-08-16 NOTE — Telephone Encounter (Signed)
 Form was signed on 08/02/23. I will forward to medical assistant and provider that had televisit with patient on that date

## 2023-08-16 NOTE — Telephone Encounter (Signed)
 Copied from CRM [id number]. Topic: General - Other >> Aug 16, 2023  1:00 PM Miquel SAILOR wrote: Reason for CRM: Patient daughter Darice calling on paperwork filled out incorrect with white out that patient did so DMV will not except for plaque. Need call back when updated [phone]

## 2023-08-16 NOTE — Telephone Encounter (Signed)
 Please place requested form on provider's desk to be signed.

## 2023-08-19 ENCOUNTER — Telehealth: Payer: Self-pay

## 2023-08-19 ENCOUNTER — Encounter: Attending: Registered Nurse | Admitting: Registered Nurse

## 2023-08-19 DIAGNOSIS — J449 Chronic obstructive pulmonary disease, unspecified: Secondary | ICD-10-CM | POA: Diagnosis not present

## 2023-08-19 DIAGNOSIS — E119 Type 2 diabetes mellitus without complications: Secondary | ICD-10-CM | POA: Diagnosis not present

## 2023-08-19 DIAGNOSIS — I69354 Hemiplegia and hemiparesis following cerebral infarction affecting left non-dominant side: Secondary | ICD-10-CM | POA: Diagnosis not present

## 2023-08-19 DIAGNOSIS — F03A3 Unspecified dementia, mild, with mood disturbance: Secondary | ICD-10-CM | POA: Diagnosis not present

## 2023-08-19 DIAGNOSIS — I69391 Dysphagia following cerebral infarction: Secondary | ICD-10-CM | POA: Diagnosis not present

## 2023-08-19 DIAGNOSIS — I10 Essential (primary) hypertension: Secondary | ICD-10-CM | POA: Diagnosis not present

## 2023-08-19 DIAGNOSIS — F3164 Bipolar disorder, current episode mixed, severe, with psychotic features: Secondary | ICD-10-CM | POA: Diagnosis not present

## 2023-08-19 DIAGNOSIS — F99 Mental disorder, not otherwise specified: Secondary | ICD-10-CM | POA: Diagnosis not present

## 2023-08-19 DIAGNOSIS — I69392 Facial weakness following cerebral infarction: Secondary | ICD-10-CM | POA: Diagnosis not present

## 2023-08-19 NOTE — Telephone Encounter (Signed)
 FYI - Pt daughter called in stating she was not aware that pt was having a surgery coming up and requested to cancel preop appt.    I called [facility] and they stated pt is not a pt there and she does not have a surgery coming up.

## 2023-08-19 NOTE — Telephone Encounter (Signed)
 S/W daughter and scheduled NP HF IN OFFICE preop appt 08/30/23 with Josefa Beauvais, NP

## 2023-08-19 NOTE — Telephone Encounter (Signed)
   Name: MARGARETT VITI  DOB: 06-06-1950  MRN: [id number]  Primary Cardiologist: None  Chart reviewed as part of pre-operative protocol coverage. Because of Earlyn Sylvan Bischoff's past medical history and time since last visit, she will require a follow-up in-office visit in order to better assess preoperative cardiovascular risk.  Patient will need a new patient appointment to establish care, on chart review does not appear to have been previously followed by cardiology. Patient was started on aspirin  and plavix  by neurology following CVA in 07/2023, recommendations regarding holding of aspirin  and plavix  will need to come from neurology.   Pre-op covering staff: - Please schedule appointment and call patient to inform them. If patient already had an upcoming appointment within acceptable timeframe, please add pre-op clearance to the appointment notes so provider is aware. - Please contact requesting surgeon's office via preferred method (i.e, phone, fax) to inform them of need for appointment prior to surgery.   Damean Poffenberger D Jakyrie Totherow, NP  08/19/2023, 7:54 AM

## 2023-08-19 NOTE — Telephone Encounter (Signed)
 Attempted to reach patient to schedule a new patient appointment. Couldn't leave a message due to voicemail box not being set up. Will try again later. First attempt.

## 2023-08-20 NOTE — Telephone Encounter (Signed)
 Message sent to PCP [name], [name], NP for referral.

## 2023-08-21 DIAGNOSIS — F3164 Bipolar disorder, current episode mixed, severe, with psychotic features: Secondary | ICD-10-CM | POA: Diagnosis not present

## 2023-08-21 DIAGNOSIS — I69354 Hemiplegia and hemiparesis following cerebral infarction affecting left non-dominant side: Secondary | ICD-10-CM | POA: Diagnosis not present

## 2023-08-21 DIAGNOSIS — F03A3 Unspecified dementia, mild, with mood disturbance: Secondary | ICD-10-CM | POA: Diagnosis not present

## 2023-08-21 DIAGNOSIS — J449 Chronic obstructive pulmonary disease, unspecified: Secondary | ICD-10-CM | POA: Diagnosis not present

## 2023-08-21 DIAGNOSIS — I69391 Dysphagia following cerebral infarction: Secondary | ICD-10-CM | POA: Diagnosis not present

## 2023-08-21 DIAGNOSIS — I10 Essential (primary) hypertension: Secondary | ICD-10-CM | POA: Diagnosis not present

## 2023-08-21 DIAGNOSIS — I69392 Facial weakness following cerebral infarction: Secondary | ICD-10-CM | POA: Diagnosis not present

## 2023-08-21 DIAGNOSIS — E119 Type 2 diabetes mellitus without complications: Secondary | ICD-10-CM | POA: Diagnosis not present

## 2023-08-21 DIAGNOSIS — F99 Mental disorder, not otherwise specified: Secondary | ICD-10-CM | POA: Diagnosis not present

## 2023-08-27 DIAGNOSIS — F3164 Bipolar disorder, current episode mixed, severe, with psychotic features: Secondary | ICD-10-CM | POA: Diagnosis not present

## 2023-08-27 DIAGNOSIS — F03A3 Unspecified dementia, mild, with mood disturbance: Secondary | ICD-10-CM | POA: Diagnosis not present

## 2023-08-27 DIAGNOSIS — I10 Essential (primary) hypertension: Secondary | ICD-10-CM | POA: Diagnosis not present

## 2023-08-27 DIAGNOSIS — J449 Chronic obstructive pulmonary disease, unspecified: Secondary | ICD-10-CM | POA: Diagnosis not present

## 2023-08-27 DIAGNOSIS — F99 Mental disorder, not otherwise specified: Secondary | ICD-10-CM | POA: Diagnosis not present

## 2023-08-27 DIAGNOSIS — I69392 Facial weakness following cerebral infarction: Secondary | ICD-10-CM | POA: Diagnosis not present

## 2023-08-27 DIAGNOSIS — I69354 Hemiplegia and hemiparesis following cerebral infarction affecting left non-dominant side: Secondary | ICD-10-CM | POA: Diagnosis not present

## 2023-08-27 DIAGNOSIS — E119 Type 2 diabetes mellitus without complications: Secondary | ICD-10-CM | POA: Diagnosis not present

## 2023-08-27 DIAGNOSIS — I69391 Dysphagia following cerebral infarction: Secondary | ICD-10-CM | POA: Diagnosis not present

## 2023-08-29 DIAGNOSIS — I10 Essential (primary) hypertension: Secondary | ICD-10-CM | POA: Diagnosis not present

## 2023-08-29 DIAGNOSIS — E119 Type 2 diabetes mellitus without complications: Secondary | ICD-10-CM | POA: Diagnosis not present

## 2023-08-29 DIAGNOSIS — F99 Mental disorder, not otherwise specified: Secondary | ICD-10-CM | POA: Diagnosis not present

## 2023-08-29 DIAGNOSIS — F3164 Bipolar disorder, current episode mixed, severe, with psychotic features: Secondary | ICD-10-CM | POA: Diagnosis not present

## 2023-08-29 DIAGNOSIS — I69392 Facial weakness following cerebral infarction: Secondary | ICD-10-CM | POA: Diagnosis not present

## 2023-08-29 DIAGNOSIS — I69354 Hemiplegia and hemiparesis following cerebral infarction affecting left non-dominant side: Secondary | ICD-10-CM | POA: Diagnosis not present

## 2023-08-29 DIAGNOSIS — I69391 Dysphagia following cerebral infarction: Secondary | ICD-10-CM | POA: Diagnosis not present

## 2023-08-29 DIAGNOSIS — F03A3 Unspecified dementia, mild, with mood disturbance: Secondary | ICD-10-CM | POA: Diagnosis not present

## 2023-08-29 DIAGNOSIS — J449 Chronic obstructive pulmonary disease, unspecified: Secondary | ICD-10-CM | POA: Diagnosis not present

## 2023-08-30 ENCOUNTER — Ambulatory Visit: Admitting: General Practice

## 2023-09-03 ENCOUNTER — Telehealth: Payer: Self-pay

## 2023-09-03 ENCOUNTER — Other Ambulatory Visit: Payer: Self-pay | Admitting: Family

## 2023-09-03 MED ORDER — ATORVASTATIN CALCIUM 40 MG PO TABS: 40.0000 mg | ORAL_TABLET | Freq: Every day | ORAL | 5 refills | Status: AC

## 2023-09-03 MED ORDER — CLOPIDOGREL BISULFATE 75 MG PO TABS: 75.0000 mg | ORAL_TABLET | Freq: Every day | ORAL | 5 refills | Status: AC

## 2023-09-03 MED ORDER — INSULIN PEN NEEDLE 32G X 4 MM MISC
1.0000 | Freq: Two times a day (BID) | 0 refills | Status: DC
Start: 2023-09-03 — End: 2023-11-25

## 2023-09-03 MED ORDER — GLYBURIDE 5 MG PO TABS: 5.0000 mg | ORAL_TABLET | Freq: Every day | ORAL | 1 refills | Status: DC

## 2023-09-03 MED ORDER — PANTOPRAZOLE SODIUM 40 MG PO TBEC: 40.0000 mg | DELAYED_RELEASE_TABLET | Freq: Every day | ORAL | 5 refills | Status: AC

## 2023-09-03 MED ORDER — CALCIUM POLYCARBOPHIL 625 MG PO TABS: 625.0000 mg | ORAL_TABLET | Freq: Every day | ORAL | 5 refills | Status: AC

## 2023-09-03 NOTE — Telephone Encounter (Signed)
 Copied from CRM [id number]. Topic: Clinical - Medication Refill >> Sep 03, 2023  1:26 PM Merlynn A wrote: Medication: atorvastatin  (LIPITOR) 40 MG tablet,  clopidogrel  (PLAVIX ) 75 MG tablet,  glyBURIDE  (DIABETA ) 5 MG tablet,  insulin  aspart protamine - aspart (NOVOLOG  70/30 MIX) (70-30) 100 UNIT/ML FlexPen,  Insulin  Pen Needle 32G X 4 MM MISC  pantoprazole  (PROTONIX ) 40 MG tablet  polycarbophil (FIBERCON) 625 MG tablet  Has the patient contacted their pharmacy? Yes (Agent: If no, request that the patient contact the pharmacy for the refill. If patient does not wish to contact the pharmacy document the reason why and proceed with request.) (Agent: If yes, when and what did the pharmacy advise?)  This is the patient's preferred pharmacy:  [id number] [address], [place] - [address] [place] [address] Phone: [phone] Fax: [phone]  Is this the correct pharmacy for this prescription? Yes If no, delete pharmacy and type the correct one.   Has the prescription been filled recently? No  Is the patient out of the medication? Yes  Has the patient been seen for an appointment in the last year OR does the patient have an upcoming appointment? Yes  Can we respond through MyChart? Yes  Agent: Please be advised that Rx refills may take up to 3 business days. We ask that you follow-up with your pharmacy.

## 2023-09-03 NOTE — Telephone Encounter (Signed)
 PA completed via cover my meds  Key: [id number]

## 2023-09-03 NOTE — Telephone Encounter (Signed)
 Notice of approval received pharmacy notified

## 2023-09-03 NOTE — Telephone Encounter (Signed)
 Copied from CRM [id number]. Topic: Clinical - Order For Equipment >> Sep 03, 2023  1:42 PM Merlynn A wrote: Reason for CRM: Patient is requesting new glucose monitor. Please advise if this is something that patient can have. Patient can be reached at [phone].

## 2023-09-03 NOTE — Telephone Encounter (Signed)
 Spoke with patients daughter to inform her that rx was sent on 8/6 for glucometer, per Darice the glucometer was not dispensed and the pharmacy told them to call us .  [facility] and left a detailed message asking that the pharmacy to dispense glucometer or notify us  if any additional action is needed on our end.

## 2023-09-04 DIAGNOSIS — I10 Essential (primary) hypertension: Secondary | ICD-10-CM | POA: Diagnosis not present

## 2023-09-04 DIAGNOSIS — F3164 Bipolar disorder, current episode mixed, severe, with psychotic features: Secondary | ICD-10-CM | POA: Diagnosis not present

## 2023-09-04 DIAGNOSIS — J449 Chronic obstructive pulmonary disease, unspecified: Secondary | ICD-10-CM | POA: Diagnosis not present

## 2023-09-04 DIAGNOSIS — E119 Type 2 diabetes mellitus without complications: Secondary | ICD-10-CM | POA: Diagnosis not present

## 2023-09-04 DIAGNOSIS — F03A3 Unspecified dementia, mild, with mood disturbance: Secondary | ICD-10-CM | POA: Diagnosis not present

## 2023-09-04 DIAGNOSIS — I69391 Dysphagia following cerebral infarction: Secondary | ICD-10-CM | POA: Diagnosis not present

## 2023-09-04 DIAGNOSIS — I69392 Facial weakness following cerebral infarction: Secondary | ICD-10-CM | POA: Diagnosis not present

## 2023-09-04 DIAGNOSIS — I69354 Hemiplegia and hemiparesis following cerebral infarction affecting left non-dominant side: Secondary | ICD-10-CM | POA: Diagnosis not present

## 2023-09-04 DIAGNOSIS — F99 Mental disorder, not otherwise specified: Secondary | ICD-10-CM | POA: Diagnosis not present

## 2023-09-05 ENCOUNTER — Ambulatory Visit: Admitting: Neurology

## 2023-09-05 ENCOUNTER — Telehealth: Payer: Self-pay | Admitting: Neurology

## 2023-09-05 VITALS — BP 122/78 | HR 60 | Ht 63.0 in

## 2023-09-05 DIAGNOSIS — I10 Essential (primary) hypertension: Secondary | ICD-10-CM | POA: Diagnosis not present

## 2023-09-05 DIAGNOSIS — I6521 Occlusion and stenosis of right carotid artery: Secondary | ICD-10-CM

## 2023-09-05 DIAGNOSIS — I63511 Cerebral infarction due to unspecified occlusion or stenosis of right middle cerebral artery: Secondary | ICD-10-CM | POA: Diagnosis not present

## 2023-09-05 DIAGNOSIS — R1310 Dysphagia, unspecified: Secondary | ICD-10-CM | POA: Diagnosis not present

## 2023-09-05 DIAGNOSIS — R414 Neurologic neglect syndrome: Secondary | ICD-10-CM

## 2023-09-05 NOTE — Telephone Encounter (Signed)
 Referral for VBCI CARE MANAGEMENT  sent to  [facility]    [facility]  Population Health  [phone]

## 2023-09-05 NOTE — Progress Notes (Addendum)
 Patient: Kathryn Mcguire Date of Birth: August 01, 1950  Reason for Visit: Stroke Clinic Follow Up  History from: Patient, daughters  Primary Neurologist: Rosemarie   ASSESSMENT AND PLAN 73 y.o. 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  Etiology large vessel disease.  Family declined IR due to baseline functional status.  Right ICA occlusion likely acute on chronic.  Continues with left-sided weakness.  Underlying history of dementia.  Has post CVA dysphagia.  - Continue aspirin  81 mg and Plavix  75 mg daily for 3 months, then aspirin  alone - Continue working with home health therapy - I will reach out to PCP, she will be establishing with Harlene at [facility].  I think she may need to consider long term care placement. I will place referral to [facility] care management.  - Fall 2 days ago, left shoulder pain, family reports home health agency will send out mobile x-ray.  Difficult to get her in and out for appointments, sliding down in the wheelchair, wants to go home  - Strict management of vascular risk factors with a goal BP less than 130/90, A1c less than 7.0, LDL less than 70 for secondary stroke prevention. Recommend against tobacco use.  - Known right ICA occlusion.  Likely acute on chronic.  Family declined interventions given baseline functional status.  Avoid low BP given right ICA occlusion. - Due to difficulty with transport, will not schedule routine follow-up here, but call for any new or worsening issues  HISTORY OF PRESENT ILLNESS: Today 09/05/23 Here with her daughters. Lives with family, dementia diagnosis prior to CVA, fall last year, broke her pelvis. She was walking, chewing tobacco. Stroke was discovered at home, she fell in the bathroom, husband heard her. Later that morning fell again, after left sided weakness was discovered. When in the hospital started moving the left side a little more, with Ritalin , more  engaged and focused. At home now, she is alert, non ambulatory, unable to use the left arm. Stand and pivot to bathroom. In home health therapy. Social worker told the aide would be coming, [facility]. Applied for Medicaid. Continues with Dysphagia 2 diet, pureeing some food, good appetite. Fall 2 days ago, sitting on potty chair and leaned off, called EMS, her BP was low. Started on insulin  and glyburide  stopped metformin .  Seated in wheelchair today, keeps scooting down.  Saying she wants to go home.  HISTORY  Admitted for acute left-sided weakness, left facial droop and right gaze preference.  Given TNK.  Found to have right internal carotid occlusion.  Interventional radiology was declined by family due to baseline functional status.  NIH was 19.  Poststroke dysphagia.  Chewing tobacco.  History of cognitive impairment with mild dementia.  Went to inpatient rehab 7/8-7/23  - Code stroke CT head no acute abnormality - CTA head and neck occlusion of the right ICA from proximal cervical segment to cavernous segment with reconstitution of paraclinoid segment likely via circle of Willis, decreased caliber and number of right MCA branches, decreased caliber in number of right ACA branches, severe stenosis at the origin of the left nondominant VA, multifocal stenosis and irregularity of the left V2 segment, likely chronic occlusion of left V4 segment after the origin of the left PICA, moderate stenosis of right PCA P2 segment, moderate stenosis of the origin of the left ICA  - MRI of the brain scattered areas of acute infarct to right MCA and right  ACA territories.  Additional punctate areas of acute infarct in the left basal ganglia and within posterior aspect of left ACA territory - 2D echo EF greater than 75% - UDS negative - LDL 110 - A1c 12.3 - No antithrombotic prior to admission, aspirin  81 and Plavix  75 mg DAPT for 3 months then aspirin  alone  REVIEW OF SYSTEMS: Out of a complete 14 system review  of symptoms, the patient complains only of the following symptoms, and all other reviewed systems are negative.  See HPI  ALLERGIES: No Known Allergies  HOME MEDICATIONS: Outpatient Medications Prior to Visit  Medication Sig Dispense Refill   acetaminophen  (TYLENOL ) 325 MG tablet Take 1-2 tablets (325-650 mg total) by mouth every 4 (four) hours as needed for mild pain (pain score 1-3).     aspirin  81 MG chewable tablet Chew 1 tablet (81 mg total) by mouth daily. 100 tablet 0   atorvastatin  (LIPITOR) 40 MG tablet Take 1 tablet (40 mg total) by mouth daily. 30 tablet 5   Blood Glucose Monitoring Suppl (ONE TOUCH ULTRA 2) w/Device KIT Use to test blood sugars three times daily. Dx: E11.9 1 kit 0   clopidogrel  (PLAVIX ) 75 MG tablet Take 1 tablet (75 mg total) by mouth daily. 30 tablet 5   donepezil  (ARICEPT ) 5 MG tablet Take 1 tablet (5 mg total) by mouth at bedtime. 30 tablet 0   glucose blood (ONETOUCH ULTRA BLUE TEST) test strip Use to test blood sugar three times daily. Dx:E11.9 300 each 1   glyBURIDE  (DIABETA ) 5 MG tablet Take 1 tablet (5 mg total) by mouth daily with breakfast. 30 tablet 1   insulin  aspart protamine - aspart (NOVOLOG  70/30 MIX) (70-30) 100 UNIT/ML FlexPen Inject 6 Units into the skin 2 (two) times daily with a meal. 15 mL 0   Insulin  Pen Needle 32G X 4 MM MISC Use 2 (two) times daily. 100 each 0   OneTouch UltraSoft 2 Lancets MISC USE TO TEST BLOOD SUGAR ONCE DAILY 100 each 1   pantoprazole  (PROTONIX ) 40 MG tablet Take 1 tablet (40 mg total) by mouth daily with supper. 30 tablet 5   polycarbophil (FIBERCON) 625 MG tablet Take 1 tablet (625 mg total) by mouth daily. 30 tablet 5   senna-docusate (SENOKOT-S) 8.6-50 MG tablet Take 1 tablet by mouth at bedtime as needed for mild constipation.     No facility-administered medications prior to visit.    PAST MEDICAL HISTORY: Past Medical History:  Diagnosis Date   Arthritis    Bipolar depression (HCC)    COPD (chronic  obstructive pulmonary disease) (HCC)    Dementia (HCC)    Diabetes mellitus without complication (HCC)    Elevated LFTs    Emphysema of lung (HCC)    GERD (gastroesophageal reflux disease)    History of adenomatous polyps of colon 10/15/2013   Hyperlipemia    Hypertension    Stroke (HCC) 07/11/2023   UTI (lower urinary tract infection)     PAST SURGICAL HISTORY: Past Surgical History:  Procedure Laterality Date   COLONOSCOPY     ESOPHAGOGASTRODUODENOSCOPY     TONSILLECTOMY     TONSILLECTOMY AND ADENOIDECTOMY     TUBAL LIGATION      FAMILY HISTORY: Family History  Problem Relation Age of Onset   Diabetes Mother    Lung cancer Sister    Diabetes Daughter    Lupus Daughter    Migraines Daughter        chronic   Bursitis Daughter  Colon cancer Neg Hx    Stomach cancer Neg Hx    Esophageal cancer Neg Hx    Rectal cancer Neg Hx     SOCIAL HISTORY: Social History   Socioeconomic History   Marital status: Single    Spouse name: Not on file   Number of children: 4   Years of education: Not on file   Highest education level: Some college, no degree  Occupational History   Occupation: Retired   Tobacco Use   Smoking status: Every Day    Types: Cigarettes   Smokeless tobacco: Current    Types: Chew   Tobacco comments:    Smokes maybe one cigarette a week. But chews everyday.  Vaping Use   Vaping status: Never Used  Substance and Sexual Activity   Alcohol use: Not Currently    Comment:     Drug use: No   Sexual activity: Not on file  Other Topics Concern   Not on file  Social History Narrative   Pt lives in single story home with her husband, daughter and granddaughter   Has 4 children   Some college education   Last place of employment - [employer]      Social History      Diet?  Eats/drinks excessively       Do you drink/eat things with caffeine? yes      Marital status?           divorced                        What year were you married?      Do you  live in a house, apartment, assisted living, condo, trailer, etc.? house      Is it one or more stories? 1      How many persons live in your home? 4      Do you have any pets in your home? Yes dog      Highest level of education completed? Some college      Current or past profession: retail and cna      Do you exercise?           yes                           Type & how often? Walking daily       Advanced Directives      Do you have a living will? No       Do you have a DNR form?                                  If not, do you want to discuss one?      Do you have signed POA/HPOA for forms? In process      Functional Status      Do you have difficulty bathing or dressing yourself? yes      Do you have difficulty preparing food or eating? yes      Do you have difficulty managing your medications? yes      Do you have difficulty managing your finances? No       Do you have difficulty affording your medications? yes      Social Drivers of Health   Financial Resource Strain: Medium Risk (08/01/2023)   Overall Financial Resource Strain (CARDIA)  Difficulty of Paying Living Expenses: Somewhat hard  Food Insecurity: Food Insecurity Present (08/01/2023)   Hunger Vital Sign    Worried About Running Out of Food in the Last Year: Sometimes true    Ran Out of Food in the Last Year: Sometimes true  Transportation Needs: Unmet Transportation Needs (08/01/2023)   PRAPARE - Transportation    Lack of Transportation (Medical): Yes    Lack of Transportation (Non-Medical): Yes  Physical Activity: Inactive (08/01/2023)   Exercise Vital Sign    Days of Exercise per Week: 0 days    Minutes of Exercise per Session: Not on file  Stress: Stress Concern Present (08/01/2023)   [employer] of Occupational Health - Occupational Stress Questionnaire    Feeling of Stress: To some extent  Social Connections: Unknown (08/01/2023)   Social Connection and Isolation Panel    Frequency of  Communication with Friends and Family: Once a week    Frequency of Social Gatherings with Friends and Family: Patient declined    Attends Religious Services: Never    Database administrator or Organizations: No    Attends Engineer, structural: Not on file    Marital Status: Divorced  Intimate Partner Violence: Not on file    PHYSICAL EXAM  Vitals:   09/05/23 1031  BP: 122/78  Pulse: 60  SpO2: 96%  Height: 5' 3 (1.6 m)   Body mass index is 21.24 kg/m.  Generalized: Well developed, in no acute distress, elderly female, seated in wheelchair Neurological examination  Mentation: Alert, oriented to situation, name, daughters names, speaks short sentences, few words, little mumbled Cranial nerve II-XII: Pupils were equal round reactive to light.  Right gaze preference, but does track to the left.  Left facial droop.  Drop to left shoulder. Motor: Minimal movement of left upper extremity, there is movement of her fingers wrist, forearms, no antigravity movement.  Moves the right arm and leg okay, with the left leg 2/5 Sensory: Sensory testing is intact to soft touch on all 4 extremities. No evidence of extinction is noted.  Coordination: Able to do FTN on the right, unable to do with the left Gait and station: Deferred, keep scooting down in wheelchair Reflexes: Deep tendon reflexes are symmetric but decreased  DIAGNOSTIC DATA (LABS, IMAGING, TESTING) - I reviewed patient records, labs, notes, testing and imaging myself where available.  Lab Results  Component Value Date   WBC 8.2 07/29/2023   HGB 12.4 07/29/2023   HCT 38.8 07/29/2023   MCV 80.8 07/29/2023   PLT 308 07/29/2023      Component Value Date/Time   NA 136 07/30/2023 1917   K 4.1 07/30/2023 1917   CL 102 07/30/2023 1917   CO2 26 07/30/2023 1917   GLUCOSE 253 (H) 07/30/2023 1917   BUN 21 07/30/2023 1917   CREATININE 0.94 07/30/2023 1917   CREATININE 0.69 07/17/2022 1354   CALCIUM  9.1 07/30/2023 1917    PROT 5.6 (L) 07/29/2023 0518   ALBUMIN 3.0 (L) 07/29/2023 0518   AST 26 07/29/2023 0518   ALT 24 07/29/2023 0518   ALKPHOS 63 07/29/2023 0518   BILITOT 0.4 07/29/2023 0518   GFRNONAA >60 07/30/2023 1917   GFRNONAA 79 01/18/2020 0858   GFRAA 91 01/18/2020 0858   Lab Results  Component Value Date   CHOL 171 07/12/2023   HDL 49 07/12/2023   LDLCALC 110 (H) 07/12/2023   TRIG 58 07/12/2023   CHOLHDL 3.5 07/12/2023   Lab Results  Component Value Date  HGBA1C 12.3 (H) 07/11/2023   No results found for: [id number] Lab Results  Component Value Date   TSH 0.45 07/17/2022   Lauraine Born, AGNP-C, DNP 09/05/2023, 11:31 AM [facility] [address] [place], [place] [address] [phone]

## 2023-09-05 NOTE — Addendum Note (Signed)
 Addended by: GAYLAND LAURAINE PARAS on: 09/05/2023 01:35 PM   Modules accepted: Orders

## 2023-09-05 NOTE — Patient Instructions (Signed)
-   Continue aspirin  81 mg and Plavix  75 mg daily for 3 months, then aspirin  alone - Continue working with home health therapy - I will reach out to PCP, she will be establishing with Harlene at [facility].  I think she may need to consider long term care placement - Fall 2 days ago, left shoulder pain, family reports home health agency will send out mobile x-ray.  Difficult to get her in and out for appointments, sliding down in the wheelchair, wants to go home  - Strict management of vascular risk factors with a goal BP less than 130/90, A1c less than 7.0, LDL less than 70 for secondary stroke prevention - Known right ICA occlusion.  Likely acute on chronic.  Family declined interventions given baseline functional status.  Avoid low BP given right ICA occlusion. - Due to difficulty with transport, will not schedule routine follow-up here, but call for any new or worsening issues

## 2023-09-06 ENCOUNTER — Ambulatory Visit: Admitting: Family

## 2023-09-06 ENCOUNTER — Ambulatory Visit: Payer: Self-pay

## 2023-09-06 DIAGNOSIS — E119 Type 2 diabetes mellitus without complications: Secondary | ICD-10-CM | POA: Diagnosis not present

## 2023-09-06 DIAGNOSIS — I69354 Hemiplegia and hemiparesis following cerebral infarction affecting left non-dominant side: Secondary | ICD-10-CM | POA: Diagnosis not present

## 2023-09-06 DIAGNOSIS — I69392 Facial weakness following cerebral infarction: Secondary | ICD-10-CM | POA: Diagnosis not present

## 2023-09-06 DIAGNOSIS — I10 Essential (primary) hypertension: Secondary | ICD-10-CM | POA: Diagnosis not present

## 2023-09-06 DIAGNOSIS — F3164 Bipolar disorder, current episode mixed, severe, with psychotic features: Secondary | ICD-10-CM | POA: Diagnosis not present

## 2023-09-06 DIAGNOSIS — J449 Chronic obstructive pulmonary disease, unspecified: Secondary | ICD-10-CM | POA: Diagnosis not present

## 2023-09-06 DIAGNOSIS — F03A3 Unspecified dementia, mild, with mood disturbance: Secondary | ICD-10-CM | POA: Diagnosis not present

## 2023-09-06 DIAGNOSIS — F99 Mental disorder, not otherwise specified: Secondary | ICD-10-CM | POA: Diagnosis not present

## 2023-09-06 DIAGNOSIS — I69391 Dysphagia following cerebral infarction: Secondary | ICD-10-CM | POA: Diagnosis not present

## 2023-09-06 NOTE — Telephone Encounter (Signed)
 Message routed to PCP [name], Elijio [place], NP as FYI

## 2023-09-06 NOTE — Telephone Encounter (Signed)
 Ulla Aquas, RN to [facility] (Selected Message)     09/06/23 12:43 PM Triaged to ED

## 2023-09-06 NOTE — Telephone Encounter (Signed)
 I agree with plan to send to ED for further evaluation.

## 2023-09-06 NOTE — Telephone Encounter (Signed)
 PT states pt fell from commode on Monday 8/25. PT is offering a mobile xray. Concerned about left clavicle. Pt is mostly nonverbal. PT reports it is very tender. Fall was not witnessed.   PT denies change in patient's baseline. He states that the family was advised to take her to the ED by neurologist yesterday and family refused.  This RN advised again that she requires and ED evaluation and can go via 911 if needed. Provided rationale that the patient had an unwitnessed fall and does not have the ability to endorce or deny a head strike. PT verbalized understanding and states he will call the family now.  FYI Only or Action Required?: FYI only for provider.  Patient was last seen in primary care on 08/02/2023 by [name], [name], NP.  Called Nurse Triage reporting No chief complaint on file..  Symptoms began several days ago.  Interventions attempted: Nothing.  Symptoms are: unchanged.  Triage Disposition: Go to ED Now (Notify PCP)  Patient/caregiver understands and will follow disposition?: Unsure  Reason for Disposition  Injury (or injuries) that need emergency care  Answer Assessment - Initial Assessment Questions 1. MECHANISM: How did the fall happen?     Fall from commode to floor, unwitnessed  2. DOMESTIC VIOLENCE AND ELDER ABUSE SCREENING: Did you fall because someone pushed you or tried to hurt you? If Yes, ask: Are you safe now?     Pt is nonverbal  3. ONSET: When did the fall happen? (e.g., minutes, hours, or days ago)     09/02/23  4. LOCATION: What part of the body hit the ground? (e.g., back, buttocks, head, hips, knees, hands, head, stomach)     Unknown, but PT states painful left clavicle   5. INJURY: Did you hurt (injure) yourself when you fell? If Yes, ask: What did you injure? Tell me more about this? (e.g., body area; type of injury; pain severity)     Pt nonverbal  6. PAIN: Is there any pain? If Yes, ask: How bad is the pain? (e.g.,  Scale 0-10; or none, mild,      Pt nonverbal  Protocols used: Falls and [facility] from [place] [facility] sent at 09/06/2023 12:19 PM EDT  Summary: Fall   Patient fell in the home on Monday. PT therapist Nate would like to know if it would be ok to complete the mobile x-ray Patient injured left shoulder Please give PT a call back at [phone]

## 2023-09-10 NOTE — Telephone Encounter (Signed)
 Message routed to clinical intake for today Bethany.B/CMA to follow up.

## 2023-09-10 NOTE — Telephone Encounter (Signed)
 Copied from CRM [id number]. Topic: Clinical - Order For Equipment >> Sep 06, 2023  3:32 PM Susanna ORN wrote: Reason for CRM: [name], Occupational Therapist, with [facility], calling to put in an equipment order for patient to have a hospital bed or hoyer lift or both. She states patient's daughter can't get her out of bed & patient is pretty much dead weight. States patient can't speak well due to stroke & recently had a fall off the toilet and will be having a mobile x-ray completed per patient's daughter. [name] stated that she's not sure if the insurance will cover it or not. She also mentioned that she doesn't care what company is used to order just as long as it gets ordered, if possible. States this will allow patient's daughter to know what other options she may have if this isn't approved. [name] states patient has been having therapy since July 27th. For further questions & concerns, please contact Marsha at [phone].

## 2023-09-10 NOTE — Telephone Encounter (Signed)
 Spoke with [name] from [facility] to let her know that the patient has an appointment on Next Monday and the occupational therapist was driving at the time but not able to look in her chart but will give the message to her daughter if the order needs to be extended   Message sent to [name], [name] C, NP

## 2023-09-10 NOTE — Telephone Encounter (Signed)
 Face to face visit required to order hospital bed.I see patient has upcoming appointment with Harlene Caro PIETY can order bed or schedule sooner appointment.

## 2023-09-10 NOTE — Telephone Encounter (Signed)
 Please send message to clinical intake Bethany.B/CMA.

## 2023-09-10 NOTE — Telephone Encounter (Signed)
 noted

## 2023-09-11 DIAGNOSIS — I69392 Facial weakness following cerebral infarction: Secondary | ICD-10-CM | POA: Diagnosis not present

## 2023-09-11 DIAGNOSIS — I10 Essential (primary) hypertension: Secondary | ICD-10-CM | POA: Diagnosis not present

## 2023-09-11 DIAGNOSIS — J449 Chronic obstructive pulmonary disease, unspecified: Secondary | ICD-10-CM | POA: Diagnosis not present

## 2023-09-11 DIAGNOSIS — I69391 Dysphagia following cerebral infarction: Secondary | ICD-10-CM | POA: Diagnosis not present

## 2023-09-11 DIAGNOSIS — F99 Mental disorder, not otherwise specified: Secondary | ICD-10-CM | POA: Diagnosis not present

## 2023-09-11 DIAGNOSIS — E119 Type 2 diabetes mellitus without complications: Secondary | ICD-10-CM | POA: Diagnosis not present

## 2023-09-11 DIAGNOSIS — F03A3 Unspecified dementia, mild, with mood disturbance: Secondary | ICD-10-CM | POA: Diagnosis not present

## 2023-09-11 DIAGNOSIS — F3164 Bipolar disorder, current episode mixed, severe, with psychotic features: Secondary | ICD-10-CM | POA: Diagnosis not present

## 2023-09-11 DIAGNOSIS — I69354 Hemiplegia and hemiparesis following cerebral infarction affecting left non-dominant side: Secondary | ICD-10-CM | POA: Diagnosis not present

## 2023-09-16 ENCOUNTER — Telehealth (INDEPENDENT_AMBULATORY_CARE_PROVIDER_SITE_OTHER): Admitting: Nurse Practitioner

## 2023-09-16 ENCOUNTER — Encounter: Payer: Self-pay | Admitting: Nurse Practitioner

## 2023-09-16 VITALS — BP 125/80

## 2023-09-16 DIAGNOSIS — I6930 Unspecified sequelae of cerebral infarction: Secondary | ICD-10-CM

## 2023-09-16 DIAGNOSIS — E1140 Type 2 diabetes mellitus with diabetic neuropathy, unspecified: Secondary | ICD-10-CM | POA: Diagnosis not present

## 2023-09-16 DIAGNOSIS — Z794 Long term (current) use of insulin: Secondary | ICD-10-CM

## 2023-09-16 DIAGNOSIS — R1310 Dysphagia, unspecified: Secondary | ICD-10-CM

## 2023-09-16 DIAGNOSIS — F0390 Unspecified dementia without behavioral disturbance: Secondary | ICD-10-CM | POA: Diagnosis not present

## 2023-09-16 DIAGNOSIS — E785 Hyperlipidemia, unspecified: Secondary | ICD-10-CM | POA: Diagnosis not present

## 2023-09-16 DIAGNOSIS — E782 Mixed hyperlipidemia: Secondary | ICD-10-CM

## 2023-09-16 MED ORDER — FREESTYLE LIBRE 3 READER DEVI: 1.0000 | Freq: Every day | 12 refills | Status: AC

## 2023-09-16 MED ORDER — FREESTYLE LIBRE 3 SENSOR MISC: 1.0000 | 12 refills | Status: AC

## 2023-09-16 NOTE — Progress Notes (Unsigned)
   This service is provided via telemedicine  B/P reading taken at home  Location of patient (ex: home, work):  Home  Patient consents to a telephone visit: Yes  Location of the provider (ex: office, home):  [facility], Office   Name of any referring provider:  N/A  Names of all persons participating in the telemedicine service and their role in the encounter:  S.Chrae B/CMA, Harlene An, NP, Darice (daughter), and Patient   Time spent on call:  10 min with medical assistant

## 2023-09-16 NOTE — Progress Notes (Signed)
 Careteam: Patient Care Team: [name], [name], NP as PCP - General (Family Medicine) [name], MD as Consulting Physician (Neurology)  Advanced Directive information    No Known Allergies  Chief Complaint  Patient presents with   Medical Management of Chronic Issues    Routine visit. Transferring to [place]. Seen Neurologist for stroke follow-up. Moderate fall risk, already in PT (home). Discuss getting a new DM meter.     HPI: Patient is a 73 y.o. female ***  Review of Systems:  ROS***  Past Medical History:  Diagnosis Date   Arthritis    Bipolar depression (HCC)    COPD (chronic obstructive pulmonary disease) (HCC)    Dementia (HCC)    Diabetes mellitus without complication (HCC)    Elevated LFTs    Emphysema of lung (HCC)    GERD (gastroesophageal reflux disease)    History of adenomatous polyps of colon 10/15/2013   Hyperlipemia    Hypertension    Stroke (HCC) 07/11/2023   UTI (lower urinary tract infection)    Past Surgical History:  Procedure Laterality Date   COLONOSCOPY     ESOPHAGOGASTRODUODENOSCOPY     TONSILLECTOMY     TONSILLECTOMY AND ADENOIDECTOMY     TUBAL LIGATION     Social History:   reports that she has quit smoking. Her smoking use included cigarettes. She has quit using smokeless tobacco.  Her smokeless tobacco use included chew. She reports that she does not currently use alcohol. She reports that she does not use drugs.  Family History  Problem Relation Age of Onset   Diabetes Mother    Lung cancer Sister    Diabetes Daughter    Lupus Daughter    Migraines Daughter        chronic   Bursitis Daughter    Colon cancer Neg Hx    Stomach cancer Neg Hx    Esophageal cancer Neg Hx    Rectal cancer Neg Hx     Medications: Patient's Medications  New Prescriptions   No medications on file  Previous Medications   ACETAMINOPHEN  (TYLENOL ) 325 MG TABLET    Take 1-2 tablets (325-650 mg total) by mouth every 4 (four) hours as  needed for mild pain (pain score 1-3).   ASPIRIN  81 MG CHEWABLE TABLET    Chew 1 tablet (81 mg total) by mouth daily.   ATORVASTATIN  (LIPITOR) 40 MG TABLET    Take 1 tablet (40 mg total) by mouth daily.   BLOOD GLUCOSE MONITORING SUPPL (ONE TOUCH ULTRA 2) W/DEVICE KIT    Use to test blood sugars three times daily. Dx: E11.9   CLOPIDOGREL  (PLAVIX ) 75 MG TABLET    Take 1 tablet (75 mg total) by mouth daily.   DONEPEZIL  (ARICEPT ) 5 MG TABLET    Take 1 tablet (5 mg total) by mouth at bedtime.   GLUCOSE BLOOD (ONETOUCH ULTRA BLUE TEST) TEST STRIP    Use to test blood sugar three times daily. Dx:E11.9   GLYBURIDE  (DIABETA ) 5 MG TABLET    Take 1 tablet (5 mg total) by mouth daily with breakfast.   INSULIN  ASPART PROTAMINE - ASPART (NOVOLOG  70/30 MIX) (70-30) 100 UNIT/ML FLEXPEN    Inject 6 Units into the skin 2 (two) times daily with a meal.   INSULIN  PEN NEEDLE 32G X 4 MM MISC    Use 2 (two) times daily.   ONETOUCH ULTRASOFT 2 LANCETS MISC    USE TO TEST BLOOD SUGAR ONCE DAILY  PANTOPRAZOLE  (PROTONIX ) 40 MG TABLET    Take 1 tablet (40 mg total) by mouth daily with supper.   POLYCARBOPHIL (FIBERCON) 625 MG TABLET    Take 1 tablet (625 mg total) by mouth daily.   SENNA-DOCUSATE (SENOKOT-S) 8.6-50 MG TABLET    Take 1 tablet by mouth at bedtime as needed for mild constipation.  Modified Medications   No medications on file  Discontinued Medications   No medications on file    Physical Exam:  Vitals:   09/16/23 1017  BP: 125/80   There is no height or weight on file to calculate BMI. Wt Readings from Last 3 Encounters:  07/31/23 119 lb 14.9 oz (54.4 kg)  07/16/23 127 lb 3.3 oz (57.7 kg)  05/07/23 114 lb 6.4 oz (51.9 kg)    Physical Exam***  Labs reviewed: Basic Metabolic Panel: Recent Labs    07/13/23 0339 07/14/23 0342 07/15/23 0500 07/16/23 0505 07/23/23 1845 07/29/23 0518 07/30/23 1917  NA 135 131* 133*   < > 135 137 136  K 3.9 3.7 4.4   < > 4.3 4.4 4.1  CL 101 99 99   < > 98  104 102  CO2 25 23 22    < > 25 26 26   GLUCOSE 200* 275* 217*   < > 161* 150* 253*  BUN 19 18 21    < > 16 15 21   CREATININE 0.66 0.67 0.63   < > 0.99 0.83 0.94  CALCIUM  9.2 8.8* 8.9   < > 9.9 9.7 9.1  MG 1.9 1.9 1.8  --   --   --   --   PHOS 3.4 3.2 3.7  --   --   --   --    < > = values in this interval not displayed.   Liver Function Tests: Recent Labs    07/11/23 1458 07/17/23 0600 07/29/23 0518  AST 21 21 26   ALT 17 15 24   ALKPHOS 63 61 63  BILITOT 0.4 0.6 0.4  PROT 6.9 6.2* 5.6*  ALBUMIN 3.7 2.9* 3.0*   No results for input(s): LIPASE, AMYLASE in the last 8760 hours. No results for input(s): AMMONIA in the last 8760 hours. CBC: Recent Labs    07/11/23 1458 07/12/23 0512 07/17/23 0600 07/18/23 0607 07/21/23 0724 07/22/23 0515 07/25/23 0458 07/29/23 0518  WBC 10.1   < > 9.7   < > 8.1 9.0 8.2 8.2  NEUTROABS 6.7  --  5.1  --  4.7  --   --   --   HGB 12.3   < > 12.0   < > 12.8 11.9* 12.5 12.4  HCT 38.5   < > 38.2   < > 39.7 36.8 39.0 38.8  MCV 80.4   < > 81.3   < > 81.0 79.7* 81.3 80.8  PLT 226   < > 237   < > 266 290 314 308   < > = values in this interval not displayed.   Lipid Panel: Recent Labs    07/12/23 0512  CHOL 171  HDL 49  LDLCALC 110*  TRIG 58  CHOLHDL 3.5   TSH: No results for input(s): TSH in the last 8760 hours. A1C: Lab Results  Component Value Date   HGBA1C 12.3 (H) 07/11/2023     Assessment/Plan 1. Type 2 diabetes mellitus without complication, with long-term current use of insulin  (HCC) (Primary) ***     Next appt: *** Vernessa Likes K. Caro BODILY  [facility]  [phone]    Virtual Visit via video  I connected with patient on 09/16/23 at 10:20 AM EDT by mychart and verified that I am speaking with the correct person using two identifiers.  Location: Patient: *** Provider: ***   I discussed the limitations, risks, security and privacy concerns of performing an evaluation and  management service by telephone and the availability of in person appointments. I also discussed with the patient that there may be a patient responsible charge related to this service. The patient expressed understanding and agreed to proceed.   I discussed the assessment and treatment plan with the patient. The patient was provided an opportunity to ask questions and all were answered. The patient agreed with the plan and demonstrated an understanding of the instructions.   The patient was advised to call back or seek an in-person evaluation if the symptoms worsen or if the condition fails to improve as anticipated.  I provided *** minutes of non-face-to-face time during this encounter.  Laterra Lubinski K. Caro BODILY Avs printed and mailed

## 2023-09-18 ENCOUNTER — Other Ambulatory Visit: Payer: Self-pay | Admitting: Licensed Clinical Social Worker

## 2023-09-19 DIAGNOSIS — J449 Chronic obstructive pulmonary disease, unspecified: Secondary | ICD-10-CM | POA: Diagnosis not present

## 2023-09-19 DIAGNOSIS — I69391 Dysphagia following cerebral infarction: Secondary | ICD-10-CM | POA: Diagnosis not present

## 2023-09-19 DIAGNOSIS — I10 Essential (primary) hypertension: Secondary | ICD-10-CM | POA: Diagnosis not present

## 2023-09-19 DIAGNOSIS — I69392 Facial weakness following cerebral infarction: Secondary | ICD-10-CM | POA: Diagnosis not present

## 2023-09-19 DIAGNOSIS — F3164 Bipolar disorder, current episode mixed, severe, with psychotic features: Secondary | ICD-10-CM | POA: Diagnosis not present

## 2023-09-19 DIAGNOSIS — F03A3 Unspecified dementia, mild, with mood disturbance: Secondary | ICD-10-CM | POA: Diagnosis not present

## 2023-09-19 DIAGNOSIS — I69354 Hemiplegia and hemiparesis following cerebral infarction affecting left non-dominant side: Secondary | ICD-10-CM | POA: Diagnosis not present

## 2023-09-19 DIAGNOSIS — F99 Mental disorder, not otherwise specified: Secondary | ICD-10-CM | POA: Diagnosis not present

## 2023-09-19 DIAGNOSIS — E119 Type 2 diabetes mellitus without complications: Secondary | ICD-10-CM | POA: Diagnosis not present

## 2023-09-20 DIAGNOSIS — I69392 Facial weakness following cerebral infarction: Secondary | ICD-10-CM | POA: Diagnosis not present

## 2023-09-20 DIAGNOSIS — I69391 Dysphagia following cerebral infarction: Secondary | ICD-10-CM | POA: Diagnosis not present

## 2023-09-20 DIAGNOSIS — F3164 Bipolar disorder, current episode mixed, severe, with psychotic features: Secondary | ICD-10-CM | POA: Diagnosis not present

## 2023-09-20 DIAGNOSIS — I69354 Hemiplegia and hemiparesis following cerebral infarction affecting left non-dominant side: Secondary | ICD-10-CM | POA: Diagnosis not present

## 2023-09-20 DIAGNOSIS — J449 Chronic obstructive pulmonary disease, unspecified: Secondary | ICD-10-CM | POA: Diagnosis not present

## 2023-09-20 DIAGNOSIS — I10 Essential (primary) hypertension: Secondary | ICD-10-CM | POA: Diagnosis not present

## 2023-09-20 DIAGNOSIS — F99 Mental disorder, not otherwise specified: Secondary | ICD-10-CM | POA: Diagnosis not present

## 2023-09-20 DIAGNOSIS — F03A3 Unspecified dementia, mild, with mood disturbance: Secondary | ICD-10-CM | POA: Diagnosis not present

## 2023-09-20 DIAGNOSIS — E119 Type 2 diabetes mellitus without complications: Secondary | ICD-10-CM | POA: Diagnosis not present

## 2023-09-23 ENCOUNTER — Telehealth: Payer: Self-pay | Admitting: Pharmacist

## 2023-09-23 DIAGNOSIS — Z794 Long term (current) use of insulin: Secondary | ICD-10-CM

## 2023-09-23 MED ORDER — ONETOUCH DELICA PLUS LANCET30G MISC: 12 refills | Status: AC

## 2023-09-23 NOTE — Progress Notes (Unsigned)
 09/25/2023 Name: Kathryn Mcguire MRN: [id number] DOB: [birth year]  Chief Complaint  Patient presents with   Medication Management    Diabetes     Kathryn Mcguire is a 73 y.o. year old female who presented for a telephone visit. Spoke with her daughter Darice.   They were referred to the pharmacist by their PCP for assistance in managing diabetes.   Subjective: Kathryn Mcguire is a 73 year old female with multiple medical conditions including but not limited to:  osteoporosis, bipolar disorder 1, hyperlipidemia, GERD, COPD, hypotension, type 2 diabetes, and left sided weakness post stroke.  Care Team: Primary Care Provider: Caro Harlene POUR, NP ; Next Scheduled Visit: 11/11/2023  Medication Access/Adherence  Current Pharmacy:  [id number] [place], [place] - [address] [place] [address] Phone: [phone] Fax: [phone]  [facility] [address] [place] [address] Phone: [phone] Fax: [phone]   Patient reports affordability concerns with their medications: No  Patient reports access/transportation concerns to their pharmacy: No  Patient reports adherence concerns with their medications:  No      Diabetes:  Current medications:   Novolog  70/30 6 units twice daily Glyburide  5 mg with breakfast    Current glucose readings: will be picking up a Freestyle CGM soon  Needs new lancets  Patient denies hypoglycemic s/sx including  dizziness, shakiness, sweating. Patient denies hyperglycemic symptoms including  polyuria, polydipsia, polyphagia, nocturia, neuropathy, blurred vision.   Macrovascular and Microvascular Risk Reduction:  Statin? yes (Atorvastatin  40 mg #90 filled 09/03/22); ACEi/ARB? no Last urinary albumin/creatinine ratio:  Lab Results  Component Value Date   MICRALBCREAT NOTE 05/16/2021   MICRALBCREAT 29 01/13/2019   Last eye exam:   Last foot exam:  07/17/2022 Tobacco Use:  Tobacco Use: Medium Risk (09/16/2023)   Patient History    Smoking Tobacco Use: Former    Smokeless Tobacco Use: Former    Passive Exposure: Not on file     Hyperlipidemia/ASCVD Risk Reduction  Current lipid lowering medications:  Atorvastatin  40 mg 1 tablet daily   Antiplatelet regimen:  Aspirin  81 mg 1 tablet daily Clopidogrel  75 mg 1 tablet daily   Objective:  Lab Results  Component Value Date   HGBA1C 12.3 (H) 07/11/2023    Lab Results  Component Value Date   CREATININE 0.94 07/30/2023   BUN 21 07/30/2023   NA 136 07/30/2023   K 4.1 07/30/2023   CL 102 07/30/2023   CO2 26 07/30/2023    Lab Results  Component Value Date   CHOL 171 07/12/2023   HDL 49 07/12/2023   LDLCALC 110 (H) 07/12/2023   TRIG 58 07/12/2023   CHOLHDL 3.5 07/12/2023    Medications Reviewed Today     Reviewed by Jolee Cassius PARAS, RPH (Pharmacist) on 09/25/23 at 1530  Med List Status: <None>   Medication Order Taking? Sig Documenting Provider Last Dose Status Informant  acetaminophen  (TYLENOL ) 325 MG tablet [id number] Yes Take 1-2 tablets (325-650 mg total) by mouth every 4 (four) hours as needed for mild pain (pain score 1-3). Maurice Sharlet GORMAN DEVONNA  Active   aspirin  81 MG chewable tablet [id number] Yes Chew 1 tablet (81 mg total) by mouth daily. Love, Pamela S, PA-C  Active   atorvastatin  (LIPITOR) 40 MG tablet [id number] Yes Take 1 tablet (40 mg total) by mouth daily. [name], [name] C, NP  Active   Blood Glucose Monitoring Suppl (ONE TOUCH ULTRA 2) w/Device KIT [id number] Yes  Use to test blood sugars three times daily. Dx: E11.9 Caro Harlene POUR, NP  Active   clopidogrel  (PLAVIX ) 75 MG tablet [id number] Yes Take 1 tablet (75 mg total) by mouth daily. [name], [name] C, NP  Active   Continuous Glucose Receiver (FREESTYLE LIBRE 3 READER) DEVI [id number] Yes 1 Device by Does not apply route daily. E11.9 Caro Harlene POUR, NP  Active   Continuous Glucose Sensor (FREESTYLE  LIBRE 3 SENSOR) [place] [id number] Yes 1 Device by Does not apply route every 14 (fourteen) days. Place 1 sensor on the skin every 14 days. Use to check glucose continuously E11.9 Caro Harlene POUR, NP  Active   donepezil  (ARICEPT ) 5 MG tablet [id number] Yes Take 1 tablet (5 mg total) by mouth at bedtime. Love, Pamela S, PA-C  Active   glucose blood (ONETOUCH ULTRA BLUE TEST) test strip [id number] Yes Use to test blood sugar three times daily. Dx:E11.9 Caro Harlene POUR, NP  Active   glyBURIDE  (DIABETA ) 5 MG tablet [id number] Yes Take 1 tablet (5 mg total) by mouth daily with breakfast. [name], [name] C, NP  Active   insulin  aspart protamine - aspart (NOVOLOG  70/30 MIX) (70-30) 100 UNIT/ML FlexPen [id number] Yes Inject 6 Units into the skin 2 (two) times daily with a meal. Love, Pamela S, PA-C  Active   Insulin  Pen Needle 32G X 4 MM MISC [id number] Yes Use 2 (two) times daily. [name], [name], NP  Active   Lancets [facility] DELICA PLUS [place]) MISC [id number] Yes Use to test blood sugar three times daily Eubanks, Jessica K, NP  Active     Discontinued 09/23/23 1520 (Change in therapy)   pantoprazole  (PROTONIX ) 40 MG tablet [id number] Yes Take 1 tablet (40 mg total) by mouth daily with supper. [name], [name] C, NP  Active   polycarbophil (FIBERCON) 625 MG tablet [id number] Yes Take 1 tablet (625 mg total) by mouth daily. [name], [name] C, NP  Active   senna-docusate (SENOKOT-S) 8.6-50 MG tablet [id number] Yes Take 1 tablet by mouth at bedtime as needed for mild constipation. Waddell Karna LABOR, NP  Active   Med List Note Debborah Hoy PARAS, [facility] 06/15/13 9156):                Assessment/Plan:   Diabetes: - Currently uncontrolled; goal A1c <7%. Cardiorenal risk reduction is optimized.. Blood pressure is at goal <130/80. LDL is not at goal.   - Medication safety concern: glyburide  is listed as potentially inappropriate in older adults on the AGS Beers Criteria due to increased risk of prolonged  hypoglycemia--consider switching to glipizide.  Renal function is preserved (eGFR > 60); however advanced age still increases hypoglycemia risk irrespective of renal status.   LDL not at goal. Consider increasing Atorvastatin  dose to 80 mg daily  Plan / Action Taken  Lancets: A new prescription was sent to the pharmacy for lancets compatible with the OneTouch Delica lancing device (per meter contents).   Will investigate Patient's blood sugars to make insulin  adjustments. She may need to move to Basal/bolus therapy.  Follow Up Plan:    Send note to Provider. Follow up with Patient in 1 week.   Cassius DOROTHA Brought, PharmD, BCACP Clinical Pharmacist [phone]

## 2023-09-25 NOTE — Telephone Encounter (Signed)
 Will have Patient's new PCP address concerns.

## 2023-09-25 NOTE — Telephone Encounter (Signed)
 Erskin Can, Mrs Sabey if following with me at this time. I am okay with the change.  Best,  Harlene

## 2023-09-26 ENCOUNTER — Other Ambulatory Visit: Payer: Self-pay | Admitting: Family

## 2023-09-29 NOTE — Patient Outreach (Signed)
 Complex Care Management   Visit Note  09/18/2023  Name:  Kathryn Mcguire MRN: [id number] DOB: 22-Oct-1950  Situation: Referral received for Complex Care Management related to Stroke and Dementia I obtained verbal consent from Caregiver.  Visit completed with Caregiver  on the phone  Background:   Past Medical History:  Diagnosis Date   Arthritis    Bipolar depression (HCC)    COPD (chronic obstructive pulmonary disease) (HCC)    Dementia (HCC)    Diabetes mellitus without complication (HCC)    Elevated LFTs    Emphysema of lung (HCC)    GERD (gastroesophageal reflux disease)    History of adenomatous polyps of colon 10/15/2013   Hyperlipemia    Hypertension    Stroke (HCC) 07/11/2023   UTI (lower urinary tract infection)     Assessment: Patient Reported Symptoms:  Cognitive Cognitive Status: Alert and oriented to person, place, and time, Confused or disoriented Cognitive/Intellectual Conditions Management [RPT]: None reported or documented in medical history or problem list   Health Maintenance Behaviors: Annual physical exam  Neurological Neurological Review of Symptoms: Weakness Neurological Management Strategies: Medication therapy, Routine screening Neurological Comment: Weakness in leg and arm  HEENT HEENT Symptoms Reported: No symptoms reported      Cardiovascular Cardiovascular Symptoms Reported: No symptoms reported Does patient have uncontrolled Hypertension?: No    Respiratory Respiratory Symptoms Reported: No symptoms reported    Endocrine Endocrine Symptoms Reported: No symptoms reported Is patient diabetic?: Yes Is patient checking blood sugars at home?: Yes Endocrine Comment: Caregiver checks pt's blood sugar in the home. Has plans to establish with Endocrynologist  Gastrointestinal Gastrointestinal Symptoms Reported: No symptoms reported      Genitourinary Genitourinary Symptoms Reported: Malodorous urine Additional Genitourinary Details: Pt had a  UTI prior to stroke Genitourinary Management Strategies: Coping strategies  Integumentary Integumentary Symptoms Reported: No symptoms reported    Musculoskeletal Musculoskelatal Symptoms Reviewed: Weakness, Limited mobility Musculoskeletal Management Strategies: Coping strategies, Routine screening, Exercise Musculoskeletal Comment: Patient is participating in therapy through [facility] Falls in the past year?: Yes Number of falls in past year: 2 or more Was there an injury with Fall?: Yes (pt broke her pelvis in Oct 2024) Fall Risk Category Calculator: 3 Patient Fall Risk Level: High Fall Risk Patient at Risk for Falls Due to: History of fall(s), Impaired mobility Fall risk Follow up: Falls prevention discussed  Psychosocial Psychosocial Symptoms Reported: Alteration in sleep habits Additional Psychological Details: Patient has difficulty sleeping at times Behavioral Management Strategies: Coping strategies, Support system Major Change/Loss/Stressor/Fears (CP): Medical condition, self Techniques to Cope with Loss/Stress/Change: Exercise Quality of Family Relationships: involved, supportive, helpful Do you feel physically threatened by others?: No    09/29/2023    PHQ2-9 Depression Screening   Little interest or pleasure in doing things    Feeling down, depressed, or hopeless    PHQ-2 - Total Score    Trouble falling or staying asleep, or sleeping too much    Feeling tired or having little energy    Poor appetite or overeating     Feeling bad about yourself - or that you are a failure or have let yourself or your family down    Trouble concentrating on things, such as reading the newspaper or watching television    Moving or speaking so slowly that other people could have noticed.  Or the opposite - being so fidgety or restless that you have been moving around a lot more than usual    Thoughts that  you would be better off dead, or hurting yourself in some way    PHQ2-9 Total Score     If you checked off any problems, how difficult have these problems made it for you to do your work, take care of things at home, or get along with other people    Depression Interventions/Treatment      There were no vitals filed for this visit.  Medications Reviewed Today     Reviewed by Ezzard Rolin BIRCH, LCSW (Social Worker) on 09/18/23 at 1353  Med List Status: <None>   Medication Order Taking? Sig Documenting Provider Last Dose Status Informant  acetaminophen  (TYLENOL ) 325 MG tablet [id number] Yes Take 1-2 tablets (325-650 mg total) by mouth every 4 (four) hours as needed for mild pain (pain score 1-3). Maurice Sharlet GORMAN DEVONNA  Active   aspirin  81 MG chewable tablet [id number] Yes Chew 1 tablet (81 mg total) by mouth daily. Maurice Sharlet GORMAN, PA-C  Active   atorvastatin  (LIPITOR) 40 MG tablet [id number] Yes Take 1 tablet (40 mg total) by mouth daily. [name], [name] C, NP  Active   Blood Glucose Monitoring Suppl (ONE TOUCH ULTRA 2) w/Device KIT [id number] Yes Use to test blood sugars three times daily. Dx: E11.9 Caro Harlene POUR, NP  Active   clopidogrel  (PLAVIX ) 75 MG tablet [id number] Yes Take 1 tablet (75 mg total) by mouth daily. [name], [name] C, NP  Active   Continuous Glucose Receiver (FREESTYLE LIBRE 3 READER) DEVI [id number] Yes 1 Device by Does not apply route daily. E11.9 Caro Harlene POUR, NP  Active   Continuous Glucose Sensor (FREESTYLE LIBRE 3 SENSOR) [place] [id number] Yes 1 Device by Does not apply route every 14 (fourteen) days. Place 1 sensor on the skin every 14 days. Use to check glucose continuously E11.9 Caro Harlene POUR, NP  Active   donepezil  (ARICEPT ) 5 MG tablet [id number] Yes Take 1 tablet (5 mg total) by mouth at bedtime. Love, Pamela S, PA-C  Active   glucose blood (ONETOUCH ULTRA BLUE TEST) test strip [id number] Yes Use to test blood sugar three times daily. Dx:E11.9 Caro Harlene POUR, NP  Active   glyBURIDE  (DIABETA ) 5 MG tablet [id number] Yes Take 1 tablet (5 mg total)  by mouth daily with breakfast. [name], [name] C, NP  Active   insulin  aspart protamine - aspart (NOVOLOG  70/30 MIX) (70-30) 100 UNIT/ML FlexPen [id number] Yes Inject 6 Units into the skin 2 (two) times daily with a meal. Love, Pamela S, PA-C  Active   Insulin  Pen Needle 32G X 4 MM MISC [id number] Yes Use 2 (two) times daily. [name], [name], NP  Active   OneTouch UltraSoft 2 Lancets MISC [id number] Yes USE TO TEST BLOOD SUGAR ONCE DAILY [name], [name] C, NP  Active   pantoprazole  (PROTONIX ) 40 MG tablet [id number] Yes Take 1 tablet (40 mg total) by mouth daily with supper. [name], [name] C, NP  Active   polycarbophil (FIBERCON) 625 MG tablet [id number] Yes Take 1 tablet (625 mg total) by mouth daily. [name], [name] C, NP  Active   senna-docusate (SENOKOT-S) 8.6-50 MG tablet [id number] Yes Take 1 tablet by mouth at bedtime as needed for mild constipation. Waddell Karna LABOR, NP  Active   Med List Note Debborah Hoy PARAS [facility] 06/15/13 9156):              Recommendation:   Continue Current Plan of Care  Follow Up Plan:   Telephone follow-up in 1 month  Rolin Ezzard, LCSW [place]  [facility], [facility] Clinical Social Worker Direct Dial: [phone]  Fax: [phone] Website: [email or web address] 9:41 AM

## 2023-09-29 NOTE — Patient Instructions (Signed)
 Visit Information  Thank you for taking time to visit with me today. Please don't hesitate to contact me if I can be of assistance to you before our next scheduled appointment.  Our next appointment is by telephone on 10/09 at 1 PM Please call the care guide team at [phone] if you need to cancel or reschedule your appointment.   Following is a copy of your care plan:   Goals Addressed             This Visit's Progress    LCSW VBCI Social Work Care Plan   On track    Problems:   Care Coordination needs related to Dementia: - with depressive features  CSW Clinical Goal(s):   Over the next 90 days the Patient will attend all scheduled medical appointments as evidenced by patient report and care team review of appointment completion in electronic MEDICAL RECORD NUMBER  demonstrate a reduction in symptoms related to Dementia with Depressive features .  Interventions:  Dementia Care:   Current level of care: Home with other family or significant other(s): spouse family member: Adult Daughter, Darice, pt's granddaughter, and great grandchild   Evaluation of patient safety in current living environment and review of Dementia resources and support (LCSW discussed with pt's caregiver, who resides in the home with pt) ADL's Assessed needs, level of care concerns, how currently meeting needs and barriers to care Discussed private pay options for personal care needs (LCSW will email listing of personal care service agencies, that may also provide respite services) Facility  Patient and family are not interested in placement  Active listening / Reflection utilized Caregiver stress acknowledged :Caregiver reports participation in short term support services. Strategies discussed to promote self-care and mindfulness Consideration on in-home help encouraged : options discussed Discussed caregiver resources and support: Resources to assist with personal care services and/or respite care  discussed. Pt has a pending LTC Medicaid application completed Emotional Support Provided Mindfulness or Relaxation training provided Provided general psycho-education for mental health needs  Patient Goals/Self-Care Activities:  Continue taking your medication as prescribed.   Increase coping skills and healthy habits  Plan:   Telephone follow up appointment with care management team member scheduled for:  4 weeks        Please call the Suicide and Crisis Lifeline: 988 go to [facility] Urgent [facility] [address], [place] [phone]) call 911 if you are experiencing a Mental Health or Behavioral Health Crisis or need someone to talk to.  Patient verbalizes understanding of instructions and care plan provided today and agrees to view in MyChart. Active MyChart status and patient understanding of how to access instructions and care plan via MyChart confirmed with patient.     Rolin Ezzard HUGHS [facility]  [facility], [facility] Clinical Social Worker Direct Dial: [phone]  Fax: [phone] Website: [email or web address] 9:42 AM

## 2023-10-01 ENCOUNTER — Telehealth: Payer: Self-pay | Admitting: Pharmacist

## 2023-10-01 ENCOUNTER — Telehealth: Payer: Self-pay

## 2023-10-01 DIAGNOSIS — I69391 Dysphagia following cerebral infarction: Secondary | ICD-10-CM | POA: Diagnosis not present

## 2023-10-01 DIAGNOSIS — E119 Type 2 diabetes mellitus without complications: Secondary | ICD-10-CM | POA: Diagnosis not present

## 2023-10-01 DIAGNOSIS — E1165 Type 2 diabetes mellitus with hyperglycemia: Secondary | ICD-10-CM

## 2023-10-01 DIAGNOSIS — J449 Chronic obstructive pulmonary disease, unspecified: Secondary | ICD-10-CM | POA: Diagnosis not present

## 2023-10-01 DIAGNOSIS — F99 Mental disorder, not otherwise specified: Secondary | ICD-10-CM | POA: Diagnosis not present

## 2023-10-01 DIAGNOSIS — I69354 Hemiplegia and hemiparesis following cerebral infarction affecting left non-dominant side: Secondary | ICD-10-CM | POA: Diagnosis not present

## 2023-10-01 DIAGNOSIS — F03A3 Unspecified dementia, mild, with mood disturbance: Secondary | ICD-10-CM | POA: Diagnosis not present

## 2023-10-01 DIAGNOSIS — F3164 Bipolar disorder, current episode mixed, severe, with psychotic features: Secondary | ICD-10-CM | POA: Diagnosis not present

## 2023-10-01 DIAGNOSIS — I69392 Facial weakness following cerebral infarction: Secondary | ICD-10-CM | POA: Diagnosis not present

## 2023-10-01 DIAGNOSIS — I10 Essential (primary) hypertension: Secondary | ICD-10-CM | POA: Diagnosis not present

## 2023-10-01 MED ORDER — GLIPIZIDE ER 5 MG PO TB24: 5.0000 mg | ORAL_TABLET | Freq: Every day | ORAL | 3 refills | Status: AC

## 2023-10-01 NOTE — Telephone Encounter (Signed)
 Called [name] back and there was no answer. Detailed voicemail was left with verbal orders.

## 2023-10-01 NOTE — Telephone Encounter (Signed)
 Copied from CRM #[id number]. Topic: Clinical - Home Health Verbal Orders >> Oct 01, 2023  3:29 PM Susanna ORN wrote: Caller/Agency: [name], Occupational Therapist w/[facility] Callback Number: [phone] Service Requested: Occupational Therapy Frequency: 1 time a week for 3 weeks & additional one time a week every other week for five Any new concerns about the patient? No

## 2023-10-01 NOTE — Progress Notes (Signed)
 10/01/2023 Name: Kathryn Mcguire MRN: [id number] DOB: January 05, 1951  Chief Complaint  Patient presents with   Medication Management    Diabetes    Kathryn Mcguire is a 73 y.o. year old female who presented for a telephone visit. I spoke with her daughter, Kathryn Mcguire who is Her caregiver. HIPAA identifiers were obtained.    They were referred to the pharmacist by their PCP for assistance in managing diabetes.    Subjective: Kathryn Mcguire is a 73 year old female with multiple medical conditions including but not limited to:  osteoporosis, bipolar disorder 1, hyperlipidemia, GERD, COPD, hypotension, type 2 diabetes, and left sided weakness post stroke.   Care Team: Primary Care Provider: Caro Harlene POUR, NP ; Next Scheduled Visit: 11/11/2023   Medication Access/Adherence  Current Pharmacy:  [id number] [place], [place] - [address] [place] [address] Phone: [phone] Fax: [phone]  [facility] [address] [place] [address] Phone: [phone] Fax: [phone]   Patient reports affordability concerns with their medications: No  Patient reports access/transportation concerns to their pharmacy: No  Patient reports adherence concerns with their medications:  No      Diabetes:  Current medications:    Glyburide  5 mg 1 tablet daily Novolog  70/30 6 units twice daily    Current glucose readings:    130, 185, 203, 131, 120, 227  Kathryn Mcguire said she did not pick up the continuous glucose monitor yet.  We spent a few minutes reviewing some of the features on the One Touch Meter the Patient has.     Patient denies hypoglycemic s/sx including  dizziness, shakiness, sweating. Patient denies hyperglycemic symptoms including  polyuria, polydipsia, polyphagia, nocturia, neuropathy, blurred vision.     Macrovascular and Microvascular Risk Reduction:  Statin? yes (Atorvastatin  40 mg lf   09/03/23); ACEi/ARB? no Last urinary albumin/creatinine ratio:  Lab Results  Component Value Date   MICRALBCREAT NOTE 05/16/2021   MICRALBCREAT 29 01/13/2019   Last eye exam:   Last foot exam: 07/17/2022 Tobacco Use:  Tobacco Use: Medium Risk (09/16/2023)   Patient History    Smoking Tobacco Use: Former    Smokeless Tobacco Use: Former    Passive Exposure: Not on file     Objective:  Lab Results  Component Value Date   HGBA1C 12.3 (H) 07/11/2023    Lab Results  Component Value Date   CREATININE 0.94 07/30/2023   BUN 21 07/30/2023   NA 136 07/30/2023   K 4.1 07/30/2023   CL 102 07/30/2023   CO2 26 07/30/2023    Lab Results  Component Value Date   CHOL 171 07/12/2023   HDL 49 07/12/2023   LDLCALC 110 (H) 07/12/2023   TRIG 58 07/12/2023   CHOLHDL 3.5 07/12/2023    Medications Reviewed Today     Reviewed by Jolee Cassius PARAS, RPH (Pharmacist) on 10/01/23 at 1500  Med List Status: <None>   Medication Order Taking? Sig Documenting Provider Last Dose Status Informant  acetaminophen  (TYLENOL ) 325 MG tablet [id number] Yes Take 1-2 tablets (325-650 mg total) by mouth every 4 (four) hours as needed for mild pain (pain score 1-3). Maurice Sharlet GORMAN DEVONNA  Active   aspirin  81 MG chewable tablet [id number] Yes Chew 1 tablet (81 mg total) by mouth daily. Maurice Sharlet GORMAN, PA-C  Active   atorvastatin  (LIPITOR) 40 MG tablet [id number] Yes Take 1 tablet (40 mg total) by mouth daily. [name], [name] C, NP  Active   Blood Glucose Monitoring Suppl (ONE TOUCH ULTRA 2) w/Device KIT [id number] Yes Use to test blood sugars three times daily. Dx: E11.9 Caro Harlene POUR, NP  Active   clopidogrel  (PLAVIX ) 75 MG tablet [id number] Yes Take 1 tablet (75 mg total) by mouth daily. [name], [name] C, NP  Active   Continuous Glucose Receiver (FREESTYLE LIBRE 3 READER) DEVI [id number]  1 Device by Does not apply route daily. E11.9 Caro Harlene POUR, NP  Active   Continuous Glucose Sensor (FREESTYLE LIBRE 3  SENSOR) [place] [id number]  1 Device by Does not apply route every 14 (fourteen) days. Place 1 sensor on the skin every 14 days. Use to check glucose continuously E11.9 Caro Harlene POUR, NP  Active   donepezil  (ARICEPT ) 5 MG tablet [id number] Yes Take 1 tablet (5 mg total) by mouth at bedtime. Love, Pamela S, PA-C  Active   glucose blood (ONETOUCH ULTRA BLUE TEST) test strip [id number] Yes Use to test blood sugar three times daily. Dx:E11.9 Caro Harlene POUR, NP  Active   glyBURIDE  (DIABETA ) 5 MG tablet [id number] Yes TAKE 1 TABLET BY MOUTH EVERY DAY WITH BREAKFAST Eubanks, Jessica K, NP  Active   insulin  aspart protamine - aspart (NOVOLOG  70/30 MIX) (70-30) 100 UNIT/ML FlexPen [id number] Yes Inject 6 Units into the skin 2 (two) times daily with a meal. Love, Pamela S, PA-C  Active   Insulin  Pen Needle 32G X 4 MM MISC [id number] Yes Use 2 (two) times daily. [name], [name], NP  Active   Lancets [facility] DELICA PLUS [place]) MISC [id number] Yes Use to test blood sugar three times daily Eubanks, Jessica K, NP  Active   pantoprazole  (PROTONIX ) 40 MG tablet [id number] Yes Take 1 tablet (40 mg total) by mouth daily with supper. [name], [name] C, NP  Active   polycarbophil (FIBERCON) 625 MG tablet [id number] Yes Take 1 tablet (625 mg total) by mouth daily. [name], [name] C, NP  Active   senna-docusate (SENOKOT-S) 8.6-50 MG tablet [id number] Yes Take 1 tablet by mouth at bedtime as needed for mild constipation. Waddell Karna LABOR, NP  Active   Med List Note Debborah Hoy PARAS, [facility] 06/15/13 9156):                09/16/2023   10:17 AM 09/05/2023   10:31 AM 07/31/2023    4:36 AM  Vitals with BMI  Height  5' 3   Weight  -- 119 lbs 15 oz  BMI   21.25  Systolic 125 122 880  Diastolic 80 78 85  Pulse  60 64      Assessment/Plan:   Diabetes: - Currently uncontrolled; goal A1c <7%. Cardiorenal risk reduction is optimized.. Blood pressure is at goal <130/80. LDL is not at goal.  - Received approval from  Provider to discontinue Glyburide  and start Glipizide  -She was on metformin  previously but it was discontinued to diarrhea.  Follow Up Plan:    Follow up with Patient and her daughter in 1 week to see if they were able to get the [facility] and Glipizide .   Cassius DOROTHA Brought, PharmD, [facility] Clinical Pharmacist [phone]

## 2023-10-04 ENCOUNTER — Telehealth: Payer: Self-pay | Admitting: *Deleted

## 2023-10-04 DIAGNOSIS — I6930 Unspecified sequelae of cerebral infarction: Secondary | ICD-10-CM

## 2023-10-04 NOTE — Progress Notes (Signed)
 Kathryn Mcguire                                          MRN: [id number]   10/04/2023   The VBCI Quality Team Specialist reviewed this patient medical record for the purposes of chart review for care gap closure. The following were reviewed: chart review for care gap closure-glycemic status assessment.    VBCI Quality Team

## 2023-10-04 NOTE — Telephone Encounter (Signed)
 Copied from CRM #[id number]. Topic: Clinical - Order For Equipment >> Oct 04, 2023  8:30 AM Zane F wrote: Reason for CRM:    Caller: [name] (Acupuncturist)  Calling From: [facility]  Calling to request a bedside commode for the patient; Please be advised that the bedside commode does need to be a drop arm commode.   Callback Number: [phone]  All orders need to be faxed to [facility] as the supplier using the number below.  Fax Number: [phone]

## 2023-10-04 NOTE — Telephone Encounter (Signed)
 Order faxed as requested

## 2023-10-04 NOTE — Telephone Encounter (Signed)
Order placed and printed.

## 2023-10-08 DIAGNOSIS — F3164 Bipolar disorder, current episode mixed, severe, with psychotic features: Secondary | ICD-10-CM | POA: Diagnosis not present

## 2023-10-08 DIAGNOSIS — Z6820 Body mass index (BMI) 20.0-20.9, adult: Secondary | ICD-10-CM

## 2023-10-08 DIAGNOSIS — Z794 Long term (current) use of insulin: Secondary | ICD-10-CM | POA: Diagnosis not present

## 2023-10-08 DIAGNOSIS — I69391 Dysphagia following cerebral infarction: Secondary | ICD-10-CM | POA: Diagnosis not present

## 2023-10-08 DIAGNOSIS — I69354 Hemiplegia and hemiparesis following cerebral infarction affecting left non-dominant side: Secondary | ICD-10-CM | POA: Diagnosis not present

## 2023-10-08 DIAGNOSIS — Z7984 Long term (current) use of oral hypoglycemic drugs: Secondary | ICD-10-CM | POA: Diagnosis not present

## 2023-10-08 DIAGNOSIS — E119 Type 2 diabetes mellitus without complications: Secondary | ICD-10-CM | POA: Diagnosis not present

## 2023-10-08 DIAGNOSIS — F17210 Nicotine dependence, cigarettes, uncomplicated: Secondary | ICD-10-CM | POA: Diagnosis not present

## 2023-10-08 DIAGNOSIS — E440 Moderate protein-calorie malnutrition: Secondary | ICD-10-CM

## 2023-10-08 DIAGNOSIS — I69392 Facial weakness following cerebral infarction: Secondary | ICD-10-CM | POA: Diagnosis not present

## 2023-10-08 DIAGNOSIS — F03A3 Unspecified dementia, mild, with mood disturbance: Secondary | ICD-10-CM

## 2023-10-08 DIAGNOSIS — J449 Chronic obstructive pulmonary disease, unspecified: Secondary | ICD-10-CM | POA: Diagnosis not present

## 2023-10-08 NOTE — Progress Notes (Signed)
 Kathryn Mcguire                                          MRN: [id number]   10/08/2023   The VBCI Quality Team Specialist reviewed this patient medical record for the purposes of chart review for care gap closure. The following were reviewed: chart review for care gap closure-kidney health evaluation for diabetes:eGFR  and uACR.  Kathryn Mcguire                                          MRN: [id number]   10/09/2023   The VBCI Quality Team Specialist reviewed this patient medical record for the purposes of chart review for care gap closure. The following were reviewed: chart review for care gap closure-diabetic eye exam.    VBCI Quality Team

## 2023-10-14 ENCOUNTER — Telehealth: Payer: Self-pay | Admitting: *Deleted

## 2023-10-14 NOTE — Progress Notes (Unsigned)
 Complex Care Management Care Guide Note  10/14/2023 Name: AMEIA MORENCY MRN: [id number] DOB: 1950-02-19  JALEIGHA DEANE is a 73 y.o. year old female who is a primary care patient of Caro Harlene POUR, NP and is actively engaged with the care management team. I reached out to Comer DELENA Pinal by phone today to assist with re-scheduling  with the Licensed Clinical Child psychotherapist.  Follow up plan: Unsuccessful telephone outreach attempt made.   Harlene Satterfield  [facility]  Value-Based Care Institute, [facility] Guide  Direct Dial: [phone]  Fax [phone]

## 2023-10-16 ENCOUNTER — Telehealth: Payer: Self-pay | Admitting: Pharmacist

## 2023-10-16 DIAGNOSIS — E1165 Type 2 diabetes mellitus with hyperglycemia: Secondary | ICD-10-CM

## 2023-10-16 NOTE — Progress Notes (Signed)
 Complex Care Management Care Guide Note  10/16/2023 Name: Kathryn Mcguire MRN: [id number] DOB: 05-28-1950  Kathryn Mcguire is a 73 y.o. year old female who is a primary care patient of Caro Harlene POUR, NP and is actively engaged with the care management team. I reached out to Comer DELENA Pinal by phone today to assist with re-scheduling  with the Licensed Clinical Child psychotherapist.  Follow up plan: Unsuccessful telephone outreach attempt made. A HIPAA compliant phone message was left for the patient providing contact information and requesting a return call. No further outreach attempts will be made at this time. We have been unable to contact the patient to reschedule for complex care management services.   Harlene Satterfield  [facility]  Value-Based Care Institute, [facility] Guide  Direct Dial: [phone]  Fax [phone]

## 2023-10-16 NOTE — Progress Notes (Signed)
   10/16/2023  Patient ID: Kathryn Mcguire, female   DOB: 03-14-1950, 73 y.o.   MRN: [id number]  Called Patient and her Daughter, Kathryn Mcguire) to see if they picked up the Kathryn Mcguire and glipizide  prescriptions. Unfortunately, they did not answer and the voicemail was not set up to leave a message. [facility] and I had to leave a message.   [facility] The Pharmacist confirmed Glipizide  was picked up but the [facility] Mcguire was not picked up.  There is a $42 copay for [facility] sensor and reader.   Plan: Follow up in 2 weeks.   Cassius DOROTHA Brought, PharmD, BCACP Clinical Pharmacist [phone]

## 2023-10-17 ENCOUNTER — Telehealth: Admitting: Licensed Clinical Social Worker

## 2023-10-17 DIAGNOSIS — I10 Essential (primary) hypertension: Secondary | ICD-10-CM | POA: Diagnosis not present

## 2023-10-17 DIAGNOSIS — I69354 Hemiplegia and hemiparesis following cerebral infarction affecting left non-dominant side: Secondary | ICD-10-CM | POA: Diagnosis not present

## 2023-10-17 DIAGNOSIS — F3164 Bipolar disorder, current episode mixed, severe, with psychotic features: Secondary | ICD-10-CM | POA: Diagnosis not present

## 2023-10-17 DIAGNOSIS — I69392 Facial weakness following cerebral infarction: Secondary | ICD-10-CM | POA: Diagnosis not present

## 2023-10-17 DIAGNOSIS — F03A3 Unspecified dementia, mild, with mood disturbance: Secondary | ICD-10-CM | POA: Diagnosis not present

## 2023-10-17 DIAGNOSIS — E119 Type 2 diabetes mellitus without complications: Secondary | ICD-10-CM | POA: Diagnosis not present

## 2023-10-17 DIAGNOSIS — I69391 Dysphagia following cerebral infarction: Secondary | ICD-10-CM | POA: Diagnosis not present

## 2023-10-17 DIAGNOSIS — E785 Hyperlipidemia, unspecified: Secondary | ICD-10-CM | POA: Diagnosis not present

## 2023-10-17 DIAGNOSIS — J449 Chronic obstructive pulmonary disease, unspecified: Secondary | ICD-10-CM | POA: Diagnosis not present

## 2023-10-23 DIAGNOSIS — F03A3 Unspecified dementia, mild, with mood disturbance: Secondary | ICD-10-CM | POA: Diagnosis not present

## 2023-10-24 ENCOUNTER — Telehealth: Payer: Self-pay

## 2023-10-24 NOTE — Telephone Encounter (Signed)
 Copied from CRM #[id number]. Topic: Clinical - Order For Equipment >> Oct 24, 2023  9:23 AM Cherylann RAMAN wrote: Reason for CRM: Luke with [facility] called to request signature on fax that was sent on 09/26 for wheelchair for patient. Luke can be reached at [phone] for additional information.

## 2023-10-24 NOTE — Telephone Encounter (Signed)
 Spoke with Luke from [facility] request signature on fax that was sent on 09/26 for wheelchair for patient but I did advise her that the was an order for a Bed side commode. Kim understand and wanted to ask Caro Harlene POUR, NP if we faxed new order for the wheelchair.   Message sent to Caro Harlene POUR, NP

## 2023-10-24 NOTE — Patient Outreach (Signed)
 First telephone outreach attempt to obtain mRS. No answer. Unable to leave message for returned call.  [facility]  Population Health Care Management Assistant  Direct Dial: [phone]  Fax: [phone] Website: [email or web address]

## 2023-10-25 ENCOUNTER — Ambulatory Visit: Admitting: Dietician

## 2023-10-25 DIAGNOSIS — I69391 Dysphagia following cerebral infarction: Secondary | ICD-10-CM | POA: Diagnosis not present

## 2023-10-25 DIAGNOSIS — F03A3 Unspecified dementia, mild, with mood disturbance: Secondary | ICD-10-CM | POA: Diagnosis not present

## 2023-10-25 DIAGNOSIS — I69354 Hemiplegia and hemiparesis following cerebral infarction affecting left non-dominant side: Secondary | ICD-10-CM | POA: Diagnosis not present

## 2023-10-25 DIAGNOSIS — J449 Chronic obstructive pulmonary disease, unspecified: Secondary | ICD-10-CM | POA: Diagnosis not present

## 2023-10-25 DIAGNOSIS — E785 Hyperlipidemia, unspecified: Secondary | ICD-10-CM | POA: Diagnosis not present

## 2023-10-25 DIAGNOSIS — I69392 Facial weakness following cerebral infarction: Secondary | ICD-10-CM | POA: Diagnosis not present

## 2023-10-25 DIAGNOSIS — I10 Essential (primary) hypertension: Secondary | ICD-10-CM | POA: Diagnosis not present

## 2023-10-25 DIAGNOSIS — E119 Type 2 diabetes mellitus without complications: Secondary | ICD-10-CM | POA: Diagnosis not present

## 2023-10-25 NOTE — Telephone Encounter (Signed)
 Signed today and placed to be faxed

## 2023-10-25 NOTE — Telephone Encounter (Signed)
 Spoke with Luke with [facility] this to let her know that the order for wheelchair will be signed and faxed.

## 2023-10-28 ENCOUNTER — Ambulatory Visit: Payer: Self-pay

## 2023-10-28 ENCOUNTER — Telehealth: Payer: Self-pay

## 2023-10-28 ENCOUNTER — Telehealth: Payer: Self-pay | Admitting: *Deleted

## 2023-10-28 DIAGNOSIS — F3164 Bipolar disorder, current episode mixed, severe, with psychotic features: Secondary | ICD-10-CM | POA: Diagnosis not present

## 2023-10-28 NOTE — Telephone Encounter (Signed)
 Marsha notified and agreed.

## 2023-10-28 NOTE — Telephone Encounter (Signed)
 Documentation noted within chart between Donzell and [name] the home health nurse---- This RN called [facility] and verified with Donzell that the home health nurse [name] has already spoken to her twice this morning & would be taking care of everything with the orders and urine. Advised patient's daughter of this and she states nothing has changed since their conversations this morning.  FYI Only or Action Required?: FYI only for provider.  Patient was last seen in primary care on 09/16/2023 by Caro Harlene POUR, NP.  Called Nurse Triage reporting Urinary Frequency.   Triage Disposition: No disposition on file.  Patient/caregiver understands and will follow disposition?:  Answer Assessment - Initial Assessment Questions Daughter has been given some urinary over the counter medicine  Documentation noted within chart between Donzell and [name] the home health nurse---- This RN called [facility] and verified with Donzell that the home health nurse [name] has already spoken to her twice this morning & would be taking care of everything with the orders and urine. Advised patient's daughter of this and she states nothing has changed since their conversations this morning.   Patient's daughter is advised to call us  back if anything changes or with any further questions/concerns. Patient's daughter is advised that if anything worsens to go to the Emergency Room. Patient's daughter verbalized understanding.      1. SYMPTOM: What's the main symptom you're concerned about? (e.g., frequency, incontinence)     Frequent urination 2. ONSET: When did the  urinary symptoms  start?     Frequency, odor---about 4 days ago 3. PAIN: Is there any pain? If Yes, ask: How bad is it? (Scale: 1-10; mild, moderate, severe)     Mother denied pain to her daughter 56. CAUSE: What do you think is causing the symptoms?     Possible urinary tract infection 5. OTHER SYMPTOMS: Do you have any other symptoms? (e.g., blood in  urine, fever, flank pain, pain with urination)     Daughter denies anything else that she is aware of  Protocols used: Urinary Symptoms-A-AH

## 2023-10-28 NOTE — Telephone Encounter (Signed)
 Copied from CRM #[id number]. Topic: Clinical - Order For Equipment >> Oct 04, 2023  8:30 AM Zane F wrote: Reason for CRM:    Caller: [name] (Acupuncturist)  Calling From: [facility]  Calling to request a bedside commode for the patient; Please be advised that the bedside commode does need to be a drop arm commode.   Callback Number: [phone]  All orders need to be faxed to [facility] as the supplier using the number below.  Fax Number: [phone] >> Oct 28, 2023  8:34 AM Zane F wrote: Caller: [name]  Calling in to check in on the patient's bedside commode order and ensure it included clinical notes to support the need and a face sheet (profile sheet to showcase the patient's demographics and diagnosis). Specialist did review patient's chart and found the order but that it stated not released/ not seen and wasn't sure if this was just for the patient online or at all.   Callback Number: [phone]

## 2023-10-28 NOTE — Telephone Encounter (Signed)
 Spoke with [name] with [facility] and she stated that she is going to contact [facility] to confirm status.

## 2023-10-28 NOTE — Telephone Encounter (Signed)
 [name] with [facility] [phone] called and stated that patient is having Frequent Urination. No other symptoms noted and vitals stable.  Patient's daughter stated that usually she has a UTI when she has this. Stated that she is soaking her Bed.   Home Health Nurse is requesting verbal orders to do a U/A and Cx.   Please Advise.

## 2023-10-28 NOTE — Telephone Encounter (Signed)
 This RN made first attempt to reach patients daughter.    Message from [place] F sent at 10/28/2023  8:27 AM EDT  Reason for Triage:  Caller: [name] ( Occupational Therapist in [facility])  Called in stating that the patient's daughter had concerns regarding the patient having urinary tract infection due to the following symptoms. The has previously had a stroke and also has dementia. Due to these conditions the patient doesn't speak is bed redden and is completely dependent.  Frequent Urination  Patient's daughter Neshia Mckenzie [phone]

## 2023-10-28 NOTE — Patient Outreach (Signed)
 Telephone outreach to patient to obtain mRS was successfully completed. MRS= 5  Kathryn Mcguire [facility] VBCI Assistant Direct Dial: [phone]  Fax: [phone] Website: [email or web address]

## 2023-10-28 NOTE — Telephone Encounter (Signed)
 Yes this is fine, If she is unable to do a clean catch needs to perform I&O cath to get a proper sample.

## 2023-11-04 ENCOUNTER — Telehealth: Payer: Self-pay | Admitting: Pharmacist

## 2023-11-04 DIAGNOSIS — J449 Chronic obstructive pulmonary disease, unspecified: Secondary | ICD-10-CM | POA: Diagnosis not present

## 2023-11-04 DIAGNOSIS — F03A3 Unspecified dementia, mild, with mood disturbance: Secondary | ICD-10-CM | POA: Diagnosis not present

## 2023-11-04 DIAGNOSIS — I10 Essential (primary) hypertension: Secondary | ICD-10-CM | POA: Diagnosis not present

## 2023-11-04 DIAGNOSIS — I69391 Dysphagia following cerebral infarction: Secondary | ICD-10-CM | POA: Diagnosis not present

## 2023-11-04 DIAGNOSIS — E785 Hyperlipidemia, unspecified: Secondary | ICD-10-CM | POA: Diagnosis not present

## 2023-11-04 DIAGNOSIS — I69354 Hemiplegia and hemiparesis following cerebral infarction affecting left non-dominant side: Secondary | ICD-10-CM | POA: Diagnosis not present

## 2023-11-04 DIAGNOSIS — E119 Type 2 diabetes mellitus without complications: Secondary | ICD-10-CM | POA: Diagnosis not present

## 2023-11-04 DIAGNOSIS — F3164 Bipolar disorder, current episode mixed, severe, with psychotic features: Secondary | ICD-10-CM | POA: Diagnosis not present

## 2023-11-04 DIAGNOSIS — I69392 Facial weakness following cerebral infarction: Secondary | ICD-10-CM | POA: Diagnosis not present

## 2023-11-04 NOTE — Progress Notes (Unsigned)
   11/04/2023  Patient ID: Kathryn Mcguire, female   DOB: [birth year], 73 y.o.   MRN: [id number]  Daughter asked to call me back

## 2023-11-05 DIAGNOSIS — N390 Urinary tract infection, site not specified: Secondary | ICD-10-CM | POA: Diagnosis not present

## 2023-11-06 ENCOUNTER — Encounter: Payer: Self-pay | Admitting: Nurse Practitioner

## 2023-11-11 ENCOUNTER — Ambulatory Visit: Payer: Self-pay | Admitting: Nurse Practitioner

## 2023-11-11 ENCOUNTER — Telehealth: Payer: Self-pay

## 2023-11-11 NOTE — Telephone Encounter (Signed)
 Copied from CRM [id number]. Topic: Clinical - Lab/Test Results >> Nov 11, 2023  9:38 AM Graeme ORN wrote: Reason for CRM: Patient daughter called to R/s appt. States care team sent results to provider but she has not heard anything back. Thinks that patient has UTI would like to know providers suggestions. Thank You   ----------------------------------------------------------------------- From previous Reason for Contact - Cancel/Reschedule: Patient/patient representative is calling to cancel or reschedule an appointment. Refer to attachments for appointment information.

## 2023-11-11 NOTE — Telephone Encounter (Signed)
See results under media tab

## 2023-11-11 NOTE — Telephone Encounter (Signed)
 Culture is still in process.  Only preliminary report is out.

## 2023-11-11 NOTE — Telephone Encounter (Signed)
 Patients daughter aware we are awaiting for final report

## 2023-11-11 NOTE — Telephone Encounter (Signed)
 Have not seen culture report- culture report not under media tab as I can tell, please reschedule missed appt.

## 2023-11-11 NOTE — Telephone Encounter (Signed)
 Ashanti, RMA  placed culture results in urgent folder on desk

## 2023-11-12 NOTE — Telephone Encounter (Signed)
 Culture results received, admin staff printed in error versus indexing. Results sent to providers email for review

## 2023-11-12 NOTE — Telephone Encounter (Signed)
 I called Sonya back after reviewing all incoming faxes today and informed her that we have not received the final results. Sonya plans to have her boss fax it again (per Grayce there records reflect final urine culture being sent on 11/07/23)   Awaiting fax

## 2023-11-12 NOTE — Telephone Encounter (Signed)
 Copied from CRM #[id number]. Topic: Clinical - Lab/Test Results >> Nov 12, 2023 11:12 AM Chiquita SQUIBB wrote: Reason for CRM: Suna a nurse with [facility] is calling in to see what the provider wanted to do with the urine culture results, if the patient needed another sample or treated. They are going to look in [facility] again for the final results and send it over if they have received it. A good call back for Suna is [phone] for any questions.

## 2023-11-13 NOTE — Telephone Encounter (Signed)
 No UTI based on culture- would recommend follow up in office to discuss symptoms and follow up labs

## 2023-11-13 NOTE — Telephone Encounter (Signed)
**Note De-identified  [place] Obfuscation** Please advise 

## 2023-11-13 NOTE — Telephone Encounter (Signed)
 Spoke with Darice, patients daughter, relayed providers response. Darice states is there anything else that Eubanks, Jessica K, NP can recommended due to strong odor. I asked if fluid intake was adequate and Darice responded  It could be better, she only has use of one arm and that doesn't help the situation.  Darice stated patient already has appointment scheduled for 12/11/23

## 2023-11-14 DIAGNOSIS — I69354 Hemiplegia and hemiparesis following cerebral infarction affecting left non-dominant side: Secondary | ICD-10-CM | POA: Diagnosis not present

## 2023-11-14 DIAGNOSIS — E119 Type 2 diabetes mellitus without complications: Secondary | ICD-10-CM | POA: Diagnosis not present

## 2023-11-14 DIAGNOSIS — I69391 Dysphagia following cerebral infarction: Secondary | ICD-10-CM | POA: Diagnosis not present

## 2023-11-14 DIAGNOSIS — E785 Hyperlipidemia, unspecified: Secondary | ICD-10-CM | POA: Diagnosis not present

## 2023-11-14 DIAGNOSIS — F3164 Bipolar disorder, current episode mixed, severe, with psychotic features: Secondary | ICD-10-CM | POA: Diagnosis not present

## 2023-11-14 DIAGNOSIS — I69392 Facial weakness following cerebral infarction: Secondary | ICD-10-CM | POA: Diagnosis not present

## 2023-11-14 DIAGNOSIS — F03A3 Unspecified dementia, mild, with mood disturbance: Secondary | ICD-10-CM | POA: Diagnosis not present

## 2023-11-14 DIAGNOSIS — J449 Chronic obstructive pulmonary disease, unspecified: Secondary | ICD-10-CM | POA: Diagnosis not present

## 2023-11-14 DIAGNOSIS — I10 Essential (primary) hypertension: Secondary | ICD-10-CM | POA: Diagnosis not present

## 2023-11-14 NOTE — Telephone Encounter (Signed)
 Would increase hydration if she has an odor.  Also some food can effect the odor but doesn't mean that something is wrong.

## 2023-11-25 ENCOUNTER — Other Ambulatory Visit: Payer: Self-pay | Admitting: Family

## 2023-11-29 ENCOUNTER — Other Ambulatory Visit: Payer: Self-pay | Admitting: Family

## 2023-11-29 DIAGNOSIS — E119 Type 2 diabetes mellitus without complications: Secondary | ICD-10-CM

## 2023-11-30 ENCOUNTER — Other Ambulatory Visit: Payer: Self-pay | Admitting: Family

## 2023-12-09 ENCOUNTER — Encounter: Admitting: Dietician

## 2023-12-11 ENCOUNTER — Encounter: Admitting: Nurse Practitioner

## 2023-12-11 NOTE — Progress Notes (Signed)
 This encounter was created in error - please disregard.

## 2023-12-19 ENCOUNTER — Other Ambulatory Visit: Payer: Self-pay | Admitting: Family

## 2023-12-19 DIAGNOSIS — F0390 Unspecified dementia without behavioral disturbance: Secondary | ICD-10-CM

## 2024-01-03 ENCOUNTER — Telehealth: Payer: Self-pay

## 2024-01-03 NOTE — Telephone Encounter (Signed)
 Incoming fax received from patients pharmacy to initiate a prior authorization for  Glipizide                  .  PA initiated through covermymeds. Key: [id number]  The following message populated when attempting to refill      It appears a PA is not needed

## 2024-01-23 ENCOUNTER — Encounter: Admitting: Dietician
